# Patient Record
Sex: Female | Born: 1955
Health system: Southern US, Community
[De-identification: ages and names within clinical notes are randomized; demographics above are authoritative.]

## PROBLEM LIST (undated history)

## (undated) DIAGNOSIS — I4892 Unspecified atrial flutter: Secondary | ICD-10-CM

## (undated) DIAGNOSIS — T7840XA Allergy, unspecified, initial encounter: Secondary | ICD-10-CM

## (undated) DIAGNOSIS — L570 Actinic keratosis: Secondary | ICD-10-CM

## (undated) DIAGNOSIS — F329 Major depressive disorder, single episode, unspecified: Secondary | ICD-10-CM

## (undated) DIAGNOSIS — K589 Irritable bowel syndrome without diarrhea: Secondary | ICD-10-CM

## (undated) DIAGNOSIS — F419 Anxiety disorder, unspecified: Secondary | ICD-10-CM

## (undated) DIAGNOSIS — Z923 Personal history of irradiation: Secondary | ICD-10-CM

## (undated) DIAGNOSIS — Z87442 Personal history of urinary calculi: Secondary | ICD-10-CM

## (undated) DIAGNOSIS — G4730 Sleep apnea, unspecified: Secondary | ICD-10-CM

## (undated) DIAGNOSIS — K829 Disease of gallbladder, unspecified: Secondary | ICD-10-CM

## (undated) DIAGNOSIS — M797 Fibromyalgia: Secondary | ICD-10-CM

## (undated) DIAGNOSIS — R112 Nausea with vomiting, unspecified: Secondary | ICD-10-CM

## (undated) DIAGNOSIS — E559 Vitamin D deficiency, unspecified: Secondary | ICD-10-CM

## (undated) DIAGNOSIS — F32A Depression, unspecified: Secondary | ICD-10-CM

## (undated) DIAGNOSIS — N200 Calculus of kidney: Secondary | ICD-10-CM

## (undated) DIAGNOSIS — E669 Obesity, unspecified: Secondary | ICD-10-CM

## (undated) DIAGNOSIS — D0512 Intraductal carcinoma in situ of left breast: Secondary | ICD-10-CM

## (undated) DIAGNOSIS — H43811 Vitreous degeneration, right eye: Secondary | ICD-10-CM

## (undated) DIAGNOSIS — R51 Headache: Secondary | ICD-10-CM

## (undated) DIAGNOSIS — M2550 Pain in unspecified joint: Secondary | ICD-10-CM

## (undated) DIAGNOSIS — E119 Type 2 diabetes mellitus without complications: Secondary | ICD-10-CM

## (undated) DIAGNOSIS — K219 Gastro-esophageal reflux disease without esophagitis: Secondary | ICD-10-CM

## (undated) DIAGNOSIS — C801 Malignant (primary) neoplasm, unspecified: Secondary | ICD-10-CM

## (undated) DIAGNOSIS — I499 Cardiac arrhythmia, unspecified: Secondary | ICD-10-CM

## (undated) DIAGNOSIS — Z9889 Other specified postprocedural states: Secondary | ICD-10-CM

## (undated) DIAGNOSIS — N8110 Cystocele, unspecified: Principal | ICD-10-CM

## (undated) HISTORY — DX: Fibromyalgia: M79.7

## (undated) HISTORY — DX: Vitreous degeneration, right eye: H43.811

## (undated) HISTORY — DX: Sleep apnea, unspecified: G47.30

## (undated) HISTORY — DX: Irritable bowel syndrome, unspecified: K58.9

## (undated) HISTORY — DX: Major depressive disorder, single episode, unspecified: F32.9

## (undated) HISTORY — DX: Depression, unspecified: F32.A

## (undated) HISTORY — DX: Cystocele, unspecified: N81.10

## (undated) HISTORY — DX: Type 2 diabetes mellitus without complications: E11.9

## (undated) HISTORY — DX: Allergy, unspecified, initial encounter: T78.40XA

## (undated) HISTORY — DX: Gastro-esophageal reflux disease without esophagitis: K21.9

## (undated) HISTORY — DX: Obesity, unspecified: E66.9

## (undated) HISTORY — DX: Calculus of kidney: N20.0

## (undated) HISTORY — DX: Headache: R51

## (undated) HISTORY — DX: Cardiac arrhythmia, unspecified: I49.9

## (undated) HISTORY — DX: Pain in unspecified joint: M25.50

## (undated) HISTORY — DX: Disease of gallbladder, unspecified: K82.9

## (undated) HISTORY — DX: Vitamin D deficiency, unspecified: E55.9

## (undated) HISTORY — DX: Actinic keratosis: L57.0

## (undated) HISTORY — DX: Intraductal carcinoma in situ of left breast: D05.12

## (undated) HISTORY — PX: SPINE SURGERY: SHX786

---
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
## 2002-02-01 HISTORY — PX: GALLBLADDER SURGERY: SHX652

## 2003-02-02 HISTORY — PX: CHOLECYSTECTOMY: SHX55

## 2003-11-06 ENCOUNTER — Ambulatory Visit: Payer: Self-pay | Admitting: Unknown Physician Specialty

## 2004-11-26 ENCOUNTER — Ambulatory Visit: Payer: Self-pay | Admitting: Unknown Physician Specialty

## 2005-02-01 HISTORY — PX: BREAST BIOPSY: SHX20

## 2005-06-30 ENCOUNTER — Ambulatory Visit: Payer: Self-pay | Admitting: General Surgery

## 2005-11-01 ENCOUNTER — Ambulatory Visit: Payer: Self-pay | Admitting: General Surgery

## 2005-11-29 ENCOUNTER — Ambulatory Visit: Payer: Self-pay | Admitting: General Surgery

## 2006-08-25 ENCOUNTER — Ambulatory Visit: Payer: Self-pay | Admitting: Family Medicine

## 2006-10-03 LAB — HM DEXA SCAN

## 2006-12-06 ENCOUNTER — Ambulatory Visit: Payer: Self-pay | Admitting: General Surgery

## 2007-02-02 HISTORY — PX: COLONOSCOPY: SHX174

## 2007-02-02 LAB — HM COLONOSCOPY

## 2007-02-03 ENCOUNTER — Ambulatory Visit: Payer: Self-pay | Admitting: General Surgery

## 2007-04-20 ENCOUNTER — Other Ambulatory Visit: Payer: Self-pay

## 2007-04-20 ENCOUNTER — Ambulatory Visit: Payer: Self-pay | Admitting: Obstetrics and Gynecology

## 2007-05-05 ENCOUNTER — Ambulatory Visit: Payer: Self-pay | Admitting: Obstetrics and Gynecology

## 2007-05-05 HISTORY — PX: DILATION AND CURETTAGE OF UTERUS: SHX78

## 2007-12-07 ENCOUNTER — Ambulatory Visit: Payer: Self-pay | Admitting: General Surgery

## 2009-01-23 ENCOUNTER — Ambulatory Visit: Payer: Self-pay | Admitting: Family Medicine

## 2009-01-29 ENCOUNTER — Ambulatory Visit: Payer: Self-pay | Admitting: Family Medicine

## 2009-05-13 ENCOUNTER — Ambulatory Visit: Payer: Self-pay | Admitting: Nurse Practitioner

## 2009-05-27 ENCOUNTER — Ambulatory Visit: Payer: Self-pay | Admitting: Nurse Practitioner

## 2009-06-24 ENCOUNTER — Ambulatory Visit: Payer: Self-pay | Admitting: Nurse Practitioner

## 2009-08-12 ENCOUNTER — Ambulatory Visit: Payer: Self-pay | Admitting: Nurse Practitioner

## 2009-09-09 ENCOUNTER — Ambulatory Visit: Payer: Self-pay | Admitting: Nurse Practitioner

## 2009-12-23 ENCOUNTER — Ambulatory Visit: Payer: Self-pay | Admitting: Nurse Practitioner

## 2010-01-27 ENCOUNTER — Ambulatory Visit: Payer: Self-pay | Admitting: General Surgery

## 2010-06-16 NOTE — Assessment & Plan Note (Signed)
NAME:  Sonya Lopez, Sonya Lopez NO.:  [id number]   MEDICAL RECORD NO.:  [id number]          PATIENT TYPE:  POB   LOCATION:  [facility] at [facility]         FACILITY:  [facility]   PHYSICIAN:  Jaynie Collins, MD     DATE OF BIRTH:  07/28/1955   DATE OF SERVICE:  05/13/2009                                  CLINIC NOTE   The patient comes to office today for new consultation on migraine  headaches.  Both of the patient's daughters are seen in this practice.  The patient started having migraine headaches while she was in college.  She was never given a formal diagnosis of migraine, but she has a  classic sensitivity to lights, sounds, smells, nausea, and sometimes  vomiting.  She describes a questionable aura.  She has not had one in so  many years that she cannot remember the sequencing.  Her visual issue is  that of white flashing spots in front of her eyes.  Her headaches are  located on her left side, left temple, occasionally on the right temple,  occasionally move to the occipital region.  The left side of her neck  and trapezius muscles are often sore, intense.  She does have  fibromyalgia and that seems to contribute to this.  She has taken  [facility] as well as Lyrica for her fibromyalgia.  She was unable to  continue those secondary to side effects.  The patient has noticed that  her headaches have been worse since January.  She did have some  significant family issues after Christmas that were likely related to  this.  She had 1 very bad headache in January which caused her  incredible pain and disability as well as nausea and vomiting.  Since  then, she has had a daily mild headache.  Her usual medications include  Mobic 15 mg for the fibromyalgia as well as imipramine 25 mg at bedtime  that is for stress and urge incontinence.  The patient has been taking a  massage and that does help.  She has had some problems with vertigo and  has taken Antivert.  She had her menopause  approximately 2 years ago,  was unable to take hormone replacement secondary to cyst developing in  her breast.  The patient denies any issues with hypertension or cardiac  disease.   ALLERGIES:  KEFLEX, DOXYCYCLINE, ERYTHROMYCIN, CLEOCIN.   OBSTETRICAL HISTORY:  G3, P3.   SURGICAL HISTORY:  Gallbladder and laparoscopic procedure in 2004, D&C  in 2009, tonsillectomy in 1961.   FAMILY HISTORY:  Diabetes, heart disease, high blood pressure.   MEDICAL HISTORY:  Positive for pneumonia, fibrocystic breast disease,  fibromyalgia, migraines.   SOCIAL HISTORY:  The patient lives at home with her husband and 1  daughter.  She does not work outside the home.  She does not smoke.  She  takes rare alcohol, rare caffeine.  She does not have any history of  physical or sexual abuse.   REVIEW OF SYSTEMS:  Positive for muscle aches, fatigue, frequent  headaches, tinnitus, nausea, vomiting, problems with urine, hot flashes.   PHYSICAL EXAMINATION:  GENERAL:  Well-developed, overweight 55 year old  Caucasian female, in no acute distress.  VITAL SIGNS:  Blood pressure is 105/75, pulse is 94, weight is 205,  height is 5 feet 6 inches.  HEENT:  Head is normocephalic and atraumatic.  Pupils equal, round, and  reactive.  NEUROLOGIC:  The patient is alert, oriented.  She has good muscle  control, good sensation, and good coordination.  CARDIAC:  Regular rate and rhythm.  LUNGS:  Clear bilaterally.   ASSESSMENT:  Transformed chronic migraine.   PLAN:  1. We will try to break this headache cycle while it is still in the      mild phase.  We will do this by using prednisone 20 mg one p.o.      q.a.m. with food for 1 week.  The patient is asked not to take her      Mobic during that time.  She is aware this may cause her to have GI      problems.  2. We will give her an Amerge taper.  She will take Amerge 1/2 tablet      b.i.d. until they are gone, and she is aware that if she gets a      headache  during that time, she can take an extra Amerge.  We will      follow her in 2 weeks and see if we have ended the cycle.  If not,      we may need to consider putting her on some type of prevention.      Remonia Richter, NP    ______________________________  Jaynie Collins, MD    LR/MEDQ  D:  05/13/2009  T:  05/14/2009  Job:  [phone]

## 2010-06-16 NOTE — Assessment & Plan Note (Signed)
NAME:  TONJI, ELLIFF NO.:  [id number]   MEDICAL RECORD NO.:  [id number]          PATIENT TYPE:  POB   LOCATION:  [facility] at [facility]         FACILITY:  [facility]   PHYSICIAN:  Scheryl Darter, MD       DATE OF BIRTH:  1955-09-19   DATE OF SERVICE:  08/12/2009                                  CLINIC NOTE   The patient comes to office today for followup on her migraine  headaches.  The patient started taking her 37.5 Effexor, took it for  approximately 3 days and noticed that she had some redness in her eyes  and discontinued.  She has been having more stress at home with some  family related issues and her headaches have worsened.  She also feels  at times that the Amerge is not strong enough and would like to have  something stronger.   PHYSICAL EXAMINATION:  VITAL SIGNS:  Blood pressure is 115/87, pulse is  87, weight is 210, height is 5 feet and 6 inches.  GENERAL:  Well-developed, well-nourished 55 year old Caucasian female,  in no acute distress.  HEENT:  Head is normocephalic.  Pupils equal and reactive.  There is no  redness in her eyes.  CARDIAC:  Regular rate and rhythm.  LUNGS:  Clear bilaterally.   ASSESSMENT:  Migraine headache.  We had another lengthy discussion today  concerning her headaches.  This is not a common side effect with  Effexor, it may have been related to some other issues and we would like  for her to retrial that.  She will also be given a prescription for  Imitrex 100 mg 1 p.o. p.r.n. migraine #9 with p.r.n. refills to take  when she needs something stronger than the Amerge.  She is aware that  she is not take the Imitrex and the Amerge in the same 24 hours.  We  also talked about her starting an exercise program.  She is currently  210 pounds.  We talked about doing water aerobics.      Remonia Richter, NP    ______________________________  Scheryl Darter, MD    LR/MEDQ  D:  08/12/2009  T:  08/13/2009  Job:  [id number]

## 2010-06-16 NOTE — Assessment & Plan Note (Signed)
NAME:  Sonya Lopez, Sonya Lopez NO.:  [id number]   MEDICAL RECORD NO.:  [id number]          PATIENT TYPE:  POB   LOCATION:  [facility] at [facility]         FACILITY:  [facility]   PHYSICIAN:  Remonia Richter, NP   DATE OF BIRTH:  May 16, 1955   DATE OF SERVICE:  09/09/2009                                  CLINIC NOTE   The patient comes to office today for followup on her migraine  headaches.  She was last seen in July.  She was having rather bedtime.  She did not retrial her Effexor.  She decided to speak with her family  and express how much stress was affecting her headaches and they seem to  have listened.  She was also started on an exercise program with which  she is walking on the treadmill almost every day and she is currently  down 2 pounds.  She has not had any headache and in fact has been  headache free for over 1 week and she does feel that her trigeminal  nerve pain has improved.  She has not had to take the Imitrex though she  did purchase it.   PHYSICAL EXAMINATION:  VITAL SIGNS:  Blood pressure is 112/82, pulse 71,  weight 208, height is 5 feet 6 inches.  GENERAL:  A well-developed, well-nourished 55 year old obese Caucasian  female in no acute distress.  HEENT:  Head is normocephalic and atraumatic.  Pupil equal and reactive.  CARDIAC:  Regular rate and rhythm.  LUNGS:  Clear bilaterally.   ALLERGIES:  KEFLEX, DOXYCYCLINE, EEC, CLEOCIN.   ASSESSMENT:  Migraine improving.   PLAN:  The patient will continue doing what she is has been doing.  She  will be aware of her triggers.  Clearly, stress is a significant trigger  for her and she will work on keeping her stress levels manageable.  She  will use the Imitrex as needed and she can certainly retrial the Effexor  if she gets worse.  Otherwise, she will follow up in 3 months.      Remonia Richter, NP     LR/MEDQ  D:  09/09/2009  T:  09/10/2009  Job:  [id number]

## 2010-06-16 NOTE — Assessment & Plan Note (Signed)
NAME:  Sonya Lopez, Sonya Lopez NO.:  [id number]   MEDICAL RECORD NO.:  [id number]          PATIENT TYPE:  POB   LOCATION:  [facility] at [facility]         FACILITY:  [facility]   PHYSICIAN:  Remonia Richter, NP   DATE OF BIRTH:  [birth year]   DATE OF SERVICE:  06/24/2009                                  CLINIC NOTE   The patient comes to office today for a followup visit from 04/26.  On  04/26, she was diagnosed with transformed chronic migraine with some  neuralgia.  We did trigger point injections that day and started her on  Effexor 37.5 mg.  She does feel that she is quite sensitive to medicines  and so we decided on a low dose.  Today when we have seen her, she is  doing exceptionally better.  She felt that the trigger point injections  helped her tremendously.  She was in fact feeling so much better that  she did not start her Effexor.  We had a long discussion today about  whether that was the right or wrong thing to do and she has decided that  she will start on the Effexor.  She is very pleased with her headache  level of pain now and she will follow up in 6 weeks.      Remonia Richter, NP     LR/MEDQ  D:  06/24/2009  T:  06/25/2009  Job:  [id number]

## 2010-06-16 NOTE — Assessment & Plan Note (Signed)
NAME:  [name], [name] NO.:  [id number]   MEDICAL RECORD NO.:  [id number]          PATIENT TYPE:  POB   LOCATION:  [facility] at [facility]         FACILITY:  [facility]   PHYSICIAN:  Remonia Richter, NP   DATE OF BIRTH:  07-May-1955   DATE OF SERVICE:  12/23/2009                                  CLINIC NOTE   The patient comes to office today for followup on her migraine  headaches.  The patient was last seen in August.  At that time, she had  been doing well and when she comes in today, she is even doing better.  She has had very few headaches.  They seem to be taking care of nicely  with the Imitrex.  Her family has seemed to really be following through  with decreasing amount of stress in her world and overall, she is doing  great.  She feels that the neuralgia has ended and again overall feels  very well.  She is continuing to exercise and take better care of  herself.  She will follow up on an as-needed basis or yearly.      Remonia Richter, NP     LR/MEDQ  D:  12/23/2009  T:  12/24/2009  Job:  [id number]

## 2010-06-16 NOTE — Assessment & Plan Note (Signed)
NAME:  Sonya Lopez, Sonya Lopez NO.:  [id number]   MEDICAL RECORD NO.:  [id number]          PATIENT TYPE:  POB   LOCATION:  [facility] at [facility]         FACILITY:  [facility]   PHYSICIAN:  Allie Bossier, MD        DATE OF BIRTH:  May 27, 1955   DATE OF SERVICE:  05/27/2009                                  CLINIC NOTE   The patient comes to the office today for followup on her migraine  headaches.  She was last seen on April 12.  Please see that note. The  patient did feel that the prednisone and Amerge taper did help with her  headache, but she still has some pain in her left temple, left  occipital, and left trap.  She is willing to start on headache  prevention.  She does feel that she is somewhat sensitive to medication.  We did discuss trigger point injections and she is prepared to do that  today.   Trigger point injection, the patient was injected 2 mL in her left  temple, 2 mL in her left occipital, and 1 mL in her left trap.  Please  see that procedure note.   PHYSICAL EXAMINATION:  GENERAL:  Well-developed, well-nourished 54-year-  old Caucasian female, in no acute distress.  VITAL SIGNS:  Blood pressure is 105/75, pulse is 71, weight is 210.  HEENT:  Head is normocephalic and atraumatic.  The patient is tender at  her temple, occipital, and trap regions.  NEUROLOGICAL:  Alert and oriented.  Good muscle tone and good muscle  sensation.   ASSESSMENT:  Transformed chronic migraine with some neuralgia.   PLAN:  We will do trigger point injections today.  Please see procedure  note.  Also, start her on Effexor 37.5 mg XR 1 p.o. daily #30 with 6  refills.  She will return in 4 weeks for followup on her headaches.  She  will call the office if she needs sooner.      Remonia Richter, NP    ______________________________  Allie Bossier, MD    LR/MEDQ  D:  05/27/2009  T:  05/28/2009  Job:  [id number]

## 2010-12-28 ENCOUNTER — Encounter: Payer: Self-pay | Admitting: Gynecology

## 2010-12-29 ENCOUNTER — Encounter: Payer: Self-pay | Admitting: Nurse Practitioner

## 2010-12-29 ENCOUNTER — Ambulatory Visit (INDEPENDENT_AMBULATORY_CARE_PROVIDER_SITE_OTHER): Payer: PRIVATE HEALTH INSURANCE | Admitting: Nurse Practitioner

## 2010-12-29 DIAGNOSIS — G43909 Migraine, unspecified, not intractable, without status migrainosus: Secondary | ICD-10-CM

## 2010-12-29 DIAGNOSIS — IMO0001 Reserved for inherently not codable concepts without codable children: Secondary | ICD-10-CM

## 2010-12-29 DIAGNOSIS — E669 Obesity, unspecified: Secondary | ICD-10-CM

## 2010-12-29 DIAGNOSIS — G43009 Migraine without aura, not intractable, without status migrainosus: Secondary | ICD-10-CM | POA: Insufficient documentation

## 2010-12-29 DIAGNOSIS — M797 Fibromyalgia: Secondary | ICD-10-CM

## 2010-12-29 MED ORDER — NARATRIPTAN HCL 2.5 MG PO TABS: 2.5000 mg | ORAL_TABLET | Freq: Once | ORAL | Status: DC

## 2010-12-29 MED ORDER — SUMATRIPTAN SUCCINATE 100 MG PO TABS: 100.0000 mg | ORAL_TABLET | Freq: Once | ORAL | Status: DC

## 2010-12-29 NOTE — Progress Notes (Signed)
S: Pt in office today to follow up on migraine headaches. She has been doing very well with only one headache every 2 months. She is taking Imitrex and Amerge as needed. She has also been having massage and exercising daily by walking 30-90 min daily. She has lost 6-7 lbs.   O: Neuro neg Cardiac RRR Lungs Clear  A: Migraine well controlled  P: Refill Imitrex and Amerge, Cont exercise, massage, RTC one year  As Pt was leaving office she asked to discuss a GYN problem. She describes a prolapsed uterus that is currently being treated by MD in [place]. She has not been happy with this care and would like to see Dr Shawnie Pons. An appointment was made for next week.

## 2010-12-29 NOTE — Patient Instructions (Signed)

## 2011-01-05 ENCOUNTER — Ambulatory Visit (INDEPENDENT_AMBULATORY_CARE_PROVIDER_SITE_OTHER): Payer: PRIVATE HEALTH INSURANCE | Admitting: Physician Assistant

## 2011-01-05 ENCOUNTER — Encounter: Payer: Self-pay | Admitting: Physician Assistant

## 2011-01-05 VITALS — BP 122/72 | HR 68 | Ht 66.0 in | Wt 199.5 lb

## 2011-01-05 DIAGNOSIS — N8110 Cystocele, unspecified: Secondary | ICD-10-CM

## 2011-01-05 DIAGNOSIS — IMO0002 Reserved for concepts with insufficient information to code with codable children: Secondary | ICD-10-CM | POA: Insufficient documentation

## 2011-01-05 DIAGNOSIS — N8111 Cystocele, midline: Secondary | ICD-10-CM

## 2011-01-05 HISTORY — DX: Cystocele, unspecified: N81.10

## 2011-01-05 NOTE — Patient Instructions (Signed)
Prolapse  Prolapse means the falling down, bulging, dropping, or drooping of a body part. Organs that commonly prolapse include the rectum, small intestine, bladder, urethra, vagina (birth canal), uterus (womb), and cervix. Prolapse occurs when the ligaments and muscle tissue around the rectum, bladder, and uterus are damaged or weakened.  CAUSES  This happens especially with:  Childbirth. Some women feel pelvic pressure or have trouble holding their urine right after childbirth, because of stretching and tearing of pelvic tissues. This generally gets better with time and the feeling usually goes away, but it may return with aging.   Chronic heavy lifting.   Aging.   Menopause, with loss of estrogen production weakening the pelvic ligaments and muscles.   Past pelvic surgery.   Obesity.   Chronic constipation.   Chronic cough.  Prolapse may affect a single organ, or several organs may prolapse at the same time. The front wall of the vagina holds up the bladder. The back wall holds up part of the lower intestine, or rectum. The uterus fills a spot in the middle. All these organs can be involved when the ligaments and muscles around the vagina relax too much. This often gets worse when women stop producing estrogen (menopause). SYMPTOMS  Uncontrolled loss of urine (incontinence) with cough, sneeze, straining, and exercise.   More force may be required to have a bowel movement, due to trapping of the stool.   When part of an organ bulges through the opening of the vagina, there is sometimes a feeling of heaviness or pressure. It may feel as though something is falling out. This sensation increases with coughing or bearing down.   If the organs protrude through the opening of the vagina and rub against the clothing, there may be soreness, ulcers, infection, pain, and bleeding.   Lower back pain.   Pushing in the upper or lower part of the vagina, to pass urine or have a bowel movement.     Problems having sexual intercourse.   Being unable to insert a tampon or applicator.  DIAGNOSIS  Usually, a physical exam is all that is needed to identify the problem. During the examination, you may be asked to cough and strain while lying down, sitting up, and standing up. Your caregiver will determine if more testing is required, such as bladder function tests. Some diagnoses are:  Cystocele: Bulging and falling of the bladder into the top of the vagina.   Rectocele: Part of the rectum bulging into the vagina.   Prolapse of the uterus: The uterus falls or drops into the vagina.   Enterocele: Bulging of the top of the vagina, after a hysterectomy (uterus removal), with the small intestine bulging into the vagina. A hernia in the top of the vagina.   Urethrocele: The urethra (urine carrying tube) bulging into the vagina.  TREATMENT  In most cases, prolapse needs to be treated only if it produces symptoms. If the symptoms are interfering with your usual daily or sexual activities, treatment may be necessary. The following are some measures that may be used to treat prolapse.  Estrogen may help elderly women with mild prolapse.   Kegel exercises may help mild cases of prolapse, by strengthening and tightening the muscles of the pelvic floor.   Pessaries are used in women who choose not to, or are unable to, have surgery. A pessary is a doughnut-shaped piece of plastic or rubber that is put into the vagina to keep the organs in place. This device must   be fitted by your caregiver. Your caregiver will also explain how to care for yourself with the pessary. If it works well for you, this may be the only treatment required.   Surgery is often the only form of treatment for more severe prolapses. There are different types of surgery available. You should discuss what the best procedure is for you. If the uterus is prolapsed, it may be removed (hysterectomy) as part of the surgical treatment.  Your caregiver will discuss the risks and benefits with you.   Uterine-vaginal suspension (surgery to hold up the organs) may be used, especially if you want to maintain your fertility.  No form of treatment is guaranteed to correct the prolapse or relieve the symptoms. HOME CARE INSTRUCTIONS   Wear a sanitary pad or absorbent product if you have incontinence of urine.   Avoid heavy lifting and straining with exercise and work.   Take over-the-counter pain medicine for minor discomfort.   Try taking estrogen or using estrogen vaginal cream.   Try Kegel exercises or use a pessary, before deciding to have surgery.   Do Kegel exercises after having a baby.  SEEK MEDICAL CARE IF:   Your symptoms interfere with your daily activities.   You need medicine to help with the discomfort.   You need to be fitted with a pessary.   You notice bleeding from the vagina.   You think you have ulcers or you notice ulcers on the cervix.   You have an oral temperature above 102 F (38.9 C).   You develop pain or blood with urination.   You have bleeding with a bowel movement.   The symptoms are interfering with your sex life.   You have urinary incontinence that interferes with your daily activities.   You lose urine with sexual intercourse.   You have a chronic cough.   You have chronic constipation.  Document Released: 07/25/2002 Document Revised: 09/30/2010 Document Reviewed: 02/02/2009 [employer] Patient Information 2012 [employer]. 

## 2011-01-05 NOTE — Progress Notes (Signed)
Chief Complaint:  Vaginal Prolapse   Sonya Lopez is  55 y.o. No obstetric history on file..  No LMP recorded. Patient is postmenopausal..  She presents complaining of Vaginal Prolapse  Reports increased vaginal pressure and palpable "bulge" coming out of vaginal with prolonged standing. Denies pain in abd and pelvis, vaginal bleeding or discharge. Positive urinary incontinence being followed by Urology. Reports recently starting new medication for bladder control without much change in leaking.   Last pap NL, 2010  Obstetrical/Gynecological History: OB History    Grav Para Term Preterm Abortions TAB SAB Ect Mult Living                  Past Medical History: Past Medical History  Diagnosis Date  . Headache   . Allergy   . Fibromyalgia   . Female cystocele 01/05/2011    Past Surgical History: Past Surgical History  Procedure Date  . Gallbladder surgery 2004  . Dilation and curettage of uterus 05/05/2007  . Tonsillectomy 1961  . Cholecystectomy   . Cesarean section     x 1    Family History: Family History  Problem Relation Age of Onset  . Diabetes Mother   . Hearing loss Mother   . Heart disease Father   . Heart disease Paternal Grandfather     Social History: History  Substance Use Topics  . Smoking status: Never Smoker   . Smokeless tobacco: Not on file  . Alcohol Use: Yes     rare    Allergies:  Allergies  Allergen Reactions  . Cleocin   . Doxycycline   . Keflex     Review of Systems - Negative except what has been reviewed in HPI  Physical Exam   Blood pressure 122/72, pulse 68, height 5\' 6"  (1.676 m), weight 199 lb 8 oz (90.493 kg).  General: General appearance - alert, well appearing, and in no distress, oriented to person, place, and time and overweight Mental status - alert, oriented to person, place, and time, normal mood, behavior, speech, dress, motor activity, and thought processes, affect appropriate to mood Abdomen - soft,  nontender, nondistended, no masses or organomegaly Focused Gynecological Exam: VULVA: normal appearing vulva with no masses, tenderness or lesions, Tanner V, VAGINA: PELVIC FLOOR EXAM: rectocele grade I cystocele  Grade 3 on relaxation, Grade 4 with bearing down,, CERVIX: normal appearing cervix without discharge or lesions, multiparous os, UTERUS: non-enlarge, non tender, ADNEXA: non-palp, non-tender   Assessment: Grade 3/4 Cystocele  Patient Active Problem List  Diagnoses  . Migraine  . Fibromyalgia  . Obesity  . Female cystocele    Plan: Discussed treatment options to include pessary and surgical repair. Pt opts for surgerical management due to hx of inability to use vaginal estrogen ring due to pelvic floor laxity.  Will obtain Pelvic US RTC 1-2 for surgical consult with MD.  Maylon Cos E. 01/05/2011,11:03 AM

## 2011-01-18 ENCOUNTER — Ambulatory Visit: Payer: PRIVATE HEALTH INSURANCE | Admitting: Obstetrics & Gynecology

## 2011-01-21 ENCOUNTER — Encounter: Payer: Self-pay | Admitting: Obstetrics & Gynecology

## 2011-01-21 ENCOUNTER — Ambulatory Visit (INDEPENDENT_AMBULATORY_CARE_PROVIDER_SITE_OTHER): Payer: PRIVATE HEALTH INSURANCE | Admitting: Obstetrics & Gynecology

## 2011-01-21 VITALS — BP 124/69 | HR 74 | Ht 66.0 in | Wt 200.0 lb

## 2011-01-21 DIAGNOSIS — Z01419 Encounter for gynecological examination (general) (routine) without abnormal findings: Secondary | ICD-10-CM

## 2011-01-21 DIAGNOSIS — N951 Menopausal and female climacteric states: Secondary | ICD-10-CM

## 2011-01-21 DIAGNOSIS — Z1272 Encounter for screening for malignant neoplasm of vagina: Secondary | ICD-10-CM

## 2011-01-21 MED ORDER — ESTRADIOL 0.1 MG/GM VA CREA: TOPICAL_CREAM | VAGINAL | Status: DC

## 2011-01-21 NOTE — Progress Notes (Signed)
Subjective:    Sonya Lopez is a 55 y.o. female who presents for an annual exam. The patient has no complaints today. The patient is not currently sexually active. Her husband has ED GYN screening history: last pap: was normal. The patient wears seatbelts: yes. The patient participates in regular exercise: yes. Has the patient ever been transfused or tattooed?: no. The patient reports that there is not domestic violence in her life.   Menstrual History: OB History    Grav Para Term Preterm Abortions TAB SAB Ect Mult Living                  Menarche age: 11 No LMP recorded. Patient is postmenopausal.    The following portions of the patient's history were reviewed and updated as appropriate: allergies, current medications, past family history, past medical history, past social history, past surgical history and problem list.  Review of Systems A comprehensive review of systems was negative. Her mother has dementia. She has mixed incontinence. Became menopausal in 2008.   Objective:    BP 124/69  Pulse 74  Ht 5\' 6"  (1.676 m)  Wt 200 lb (90.719 kg)  BMI 32.28 kg/m2  General Appearance:    Alert, cooperative, no distress, appears stated age  Head:    Normocephalic, without obvious abnormality, atraumatic  Eyes:    PERRL, conjunctiva/corneas clear, EOM's intact, fundi    benign, both eyes  Ears:    Normal TM's and external ear canals, both ears  Nose:   Nares normal, septum midline, mucosa normal, no drainage    or sinus tenderness  Throat:   Lips, mucosa, and tongue normal; teeth and gums normal  Neck:   Supple, symmetrical, trachea midline, no adenopathy;    thyroid:  no enlargement/tenderness/nodules; no carotid   bruit or JVD  Back:     Symmetric, no curvature, ROM normal, no CVA tenderness  Lungs:     Clear to auscultation bilaterally, respirations unlabored  Chest Wall:    No tenderness or deformity   Heart:    Regular rate and rhythm, S1 and S2 normal, no murmur, rub    or gallop  Breast Exam:    No tenderness, masses, or nipple abnormality  Abdomen:     Soft, non-tender, bowel sounds active all four quadrants,    no masses, no organomegaly  Genitalia:    Normal female without lesion, discharge or tenderness, moderate atrophy noted, 3rd degree uterine prolapse, 4th degree cystocele, no rectocele, NSSA, NT, no adnexal masses     Extremities:   Extremities normal, atraumatic, no cyanosis or edema  Pulses:   2+ and symmetric all extremities  Skin:   Skin color, texture, turgor normal, no rashes or lesions  Lymph nodes:   Cervical, supraclavicular, and axillary nodes normal  Neurologic:   CNII-XII intact, normal strength, sensation and reflexes    throughout  .    Assessment:    Healthy female exam.  Mixed incontinence Symptomatic uterine prolapse and cystocele   Plan:     Await pap smear results.  Mammogram February 03, 2011

## 2011-02-02 HISTORY — PX: ABDOMINAL HYSTERECTOMY: SHX81

## 2011-02-03 ENCOUNTER — Ambulatory Visit: Payer: Self-pay | Admitting: General Surgery

## 2011-02-18 ENCOUNTER — Ambulatory Visit: Payer: PRIVATE HEALTH INSURANCE | Admitting: Obstetrics & Gynecology

## 2011-02-25 ENCOUNTER — Ambulatory Visit (INDEPENDENT_AMBULATORY_CARE_PROVIDER_SITE_OTHER): Payer: PRIVATE HEALTH INSURANCE | Admitting: Obstetrics & Gynecology

## 2011-02-25 ENCOUNTER — Encounter: Payer: Self-pay | Admitting: Obstetrics & Gynecology

## 2011-02-25 VITALS — BP 128/84 | HR 99 | Ht 66.0 in | Wt 199.0 lb

## 2011-02-25 DIAGNOSIS — N814 Uterovaginal prolapse, unspecified: Secondary | ICD-10-CM

## 2011-02-25 NOTE — Progress Notes (Signed)
  Subjective:    Patient ID: Sonya Lopez, female    DOB: 1955/07/03, 56 y.o.   MRN: [id number]  HPI  Sonya Lopez is here today with her daughter and granddaughter to tell me that although she would like surgery, she is unable to do so now because her elderly mother is going into [facility] and will be needing her as a caretaker. She would like to try a pessary in the meantime.  Review of Systems     Objective:   Physical Exam   Good estrogen effect of the mucosa (using vaginal estrogen) A # 6 ring fit well, relieved her discomfort. She was able to remove and insert it without difficulty    Assessment & Plan:  4 th uterine prolapse and cystocele- She will use pessary and have surgery prn

## 2011-03-25 ENCOUNTER — Ambulatory Visit: Payer: PRIVATE HEALTH INSURANCE | Admitting: Obstetrics & Gynecology

## 2011-04-16 ENCOUNTER — Inpatient Hospital Stay: Payer: Self-pay | Admitting: Student

## 2011-04-16 DIAGNOSIS — E119 Type 2 diabetes mellitus without complications: Secondary | ICD-10-CM | POA: Insufficient documentation

## 2011-04-16 LAB — COMPREHENSIVE METABOLIC PANEL
Bilirubin,Total: 0.3 mg/dL (ref 0.2–1.0)
Chloride: 103 mmol/L (ref 98–107)
Co2: 28 mmol/L (ref 21–32)
EGFR (African American): >60
EGFR (Non-African Amer.): >60
Glucose: 127 mg/dL — ABNORMAL HIGH (ref 65–99)
Osmolality: 283 (ref 275–301)
SGOT(AST): 30 U/L (ref 15–37)
SGPT (ALT): 31 U/L
Sodium: 140 mmol/L (ref 136–145)
Total Protein: 8 g/dL (ref 6.4–8.2)

## 2011-04-16 LAB — CBC
HCT: 42.7 % (ref 35.0–47.0)
HGB: 14.3 g/dL (ref 12.0–16.0)
MCHC: 33.5 g/dL (ref 32.0–36.0)
RBC: 4.68 10*6/uL (ref 3.80–5.20)
WBC: 11.7 10*3/uL — ABNORMAL HIGH (ref 3.6–11.0)

## 2011-04-16 LAB — PROTIME-INR: INR: 0.9

## 2011-04-16 LAB — TROPONIN I: Troponin-I: 0.02 ng/mL

## 2011-04-17 LAB — BASIC METABOLIC PANEL
Calcium, Total: 9.2 mg/dL (ref 8.5–10.1)
Chloride: 107 mmol/L (ref 98–107)
Co2: 28 mmol/L (ref 21–32)
EGFR (African American): >60
Glucose: 90 mg/dL (ref 65–99)
Osmolality: 282 (ref 275–301)
Potassium: 4.2 mmol/L (ref 3.5–5.1)
Sodium: 141 mmol/L (ref 136–145)

## 2011-04-17 LAB — CK TOTAL AND CKMB (NOT AT ARMC)
CK, Total: 60 U/L
CK-MB: 0.8 ng/mL

## 2011-04-17 LAB — LIPID PANEL
Cholesterol: 164 mg/dL (ref 0–200)
HDL Cholesterol: 43 mg/dL (ref 40–60)
Triglycerides: 83 mg/dL (ref 0–200)
VLDL Cholesterol, Calc: 17 mg/dL (ref 5–40)

## 2011-04-17 LAB — CBC WITH DIFFERENTIAL/PLATELET
Basophil #: 0 10*3/uL (ref 0.0–0.1)
Eosinophil #: 0.1 10*3/uL (ref 0.0–0.7)
Eosinophil %: 0.8 %
HCT: 41.4 % (ref 35.0–47.0)
Lymphocyte #: 2.3 10*3/uL (ref 1.0–3.6)
Lymphocyte %: 29.8 %
MCHC: 32.7 g/dL (ref 32.0–36.0)
Monocyte %: 7.8 %
Neutrophil #: 4.7 10*3/uL (ref 1.4–6.5)
Platelet: 282 10*3/uL (ref 150–440)
RDW: 14.3 % (ref 11.5–14.5)

## 2011-04-17 LAB — TROPONIN I: Troponin-I: <0.02 ng/mL

## 2011-04-17 LAB — CLOSTRIDIUM DIFFICILE BY PCR

## 2011-04-17 LAB — MAGNESIUM: Magnesium: 2.1 mg/dL

## 2011-04-17 LAB — HEMOGLOBIN A1C: Hemoglobin A1C: 6.7 % — ABNORMAL HIGH (ref 4.2–6.3)

## 2011-04-19 ENCOUNTER — Ambulatory Visit: Payer: PRIVATE HEALTH INSURANCE | Admitting: Obstetrics & Gynecology

## 2011-04-29 ENCOUNTER — Ambulatory Visit: Payer: PRIVATE HEALTH INSURANCE | Admitting: Obstetrics & Gynecology

## 2011-05-03 ENCOUNTER — Ambulatory Visit (INDEPENDENT_AMBULATORY_CARE_PROVIDER_SITE_OTHER): Payer: PRIVATE HEALTH INSURANCE | Admitting: Cardiovascular Disease

## 2011-05-03 ENCOUNTER — Encounter: Payer: Self-pay | Admitting: Cardiovascular Disease

## 2011-05-03 VITALS — BP 132/65 | HR 57 | Ht 64.0 in | Wt 199.0 lb

## 2011-05-03 DIAGNOSIS — I4892 Unspecified atrial flutter: Secondary | ICD-10-CM | POA: Insufficient documentation

## 2011-05-03 DIAGNOSIS — E669 Obesity, unspecified: Secondary | ICD-10-CM

## 2011-05-03 DIAGNOSIS — Z01810 Encounter for preprocedural cardiovascular examination: Secondary | ICD-10-CM | POA: Insufficient documentation

## 2011-05-03 MED ORDER — AMIODARONE HCL 200 MG PO TABS: 200.0000 mg | ORAL_TABLET | Freq: Every day | ORAL | Status: DC

## 2011-05-03 MED ORDER — METOPROLOL TARTRATE 25 MG PO TABS: 25.0000 mg | ORAL_TABLET | Freq: Two times a day (BID) | ORAL | Status: DC

## 2011-05-03 NOTE — Assessment & Plan Note (Signed)
We have encouraged continued exercise, careful diet management in an effort to lose weight. 

## 2011-05-03 NOTE — Patient Instructions (Signed)
You are doing well. Please stop the amiodarone for now  If you have tachycardia again, Take amiodarone x 2 with extra metoprolol pill Call the office  If you continue to have significant fatigue, call the office  Please call us if you have new issues that need to be addressed before your next appt.  Your physician wants you to follow-up in: 6 months.  You will receive a reminder letter in the mail two months in advance. If you don't receive a letter, please call our office to schedule the follow-up appointment.

## 2011-05-03 NOTE — Progress Notes (Signed)
Patient ID: Sonya Lopez, female    DOB: 06-27-1955, 56 y.o.   MRN: [id number]  HPI Comments: Sonya Lopez is a very pleasant 56 year old woman with a history of diabetes, GERD, mild obesity with recent hospital admission in 04/16/2011 for atrial flutter with RVR. She presents to establish care.  She reports that on March 5 she had a sinus infection. She was on Augmentin for 10 days, also taking Sudafed. She developed atrial flutter on March 14 felt as a rapid rhythm, tightness in her chest with mild shortness of breath. She went to the emergency room when her fast heart rate did not improve.  EKG showed atrial flutter with ventricular rate 145 beats per minute. She was given amiodarone IV and then by mouth conversion to normal sinus rhythm.  Echocardiogram is essentially normal with normal systolic function, ejection fraction greater than 55%, no significant valvular disease for wall motion abnormality.  Since then she has had occasional palpitations but otherwise feels well. No significant shortness of breath.  She was started on aspirin in the hospital. She does have some fatigue on amiodarone and metoprolol.  EKG today shows sinus bradycardia with rate 57 beats per minute with no significant ST or T wave changes Chest x-ray in the hospital was essentially normal Total cholesterol 164, HDL 43, LDL 104, TSH 1.89   Outpatient Encounter Prescriptions as of 05/03/2011  Medication Sig Dispense Refill  . amiodarone (PACERONE) 200 MG tablet Take 1 tablet (200 mg total) by mouth daily.  30 tablet  6  . aspirin 81 MG tablet Take 81 mg by mouth daily.      . calcium carbonate 200 MG capsule Take 250 mg by mouth 2 (two) times daily with a meal.        . clonazePAM (KLONOPIN) 0.5 MG tablet Take 0.5 mg by mouth as directed. Take 1/2 tablet to 1 tablet if needed for stress.      . desloratadine (CLARINEX) 5 MG tablet Take 5 mg by mouth daily.        Marland Kitchen esomeprazole (NEXIUM) 40 MG capsule Take 40  mg by mouth daily before breakfast.        . estradiol (ESTRACE) 0.1 MG/GM vaginal cream Place 1/2 a gram into vagina every other night.  42.5 g  12  . imipramine (TOFRANIL) 25 MG tablet Take 25 mg by mouth at bedtime.        . meloxicam (MOBIC) 15 MG tablet Take 15 mg by mouth daily.        . metoprolol tartrate (LOPRESSOR) 25 MG tablet Take 1 tablet (25 mg total) by mouth 2 (two) times daily.  60 tablet  6  . Multiple Vitamin (MULTIVITAMIN) capsule Take 1 capsule by mouth daily.        . naratriptan (AMERGE) 2.5 MG tablet Take 1 tablet (2.5 mg total) by mouth once. May repeat once in 4 hours  9 tablet  11  . Probiotic Product (PROBIOTIC PO) Take by mouth.        . SUMAtriptan (IMITREX) 100 MG tablet Take 1 tablet (100 mg total) by mouth once. May repeat once in 2 hours  9 tablet  11    Review of Systems  Constitutional: Negative.   HENT: Negative.   Eyes: Negative.   Respiratory: Negative.   Cardiovascular: Negative.   Gastrointestinal: Negative.   Musculoskeletal: Negative.   Skin: Negative.   Neurological: Negative.   Hematological: Negative.   Psychiatric/Behavioral: Negative.   All other  systems reviewed and are negative.    BP 132/65  Pulse 57  Wt 199 lb (90.266 kg)  Physical Exam  Nursing note and vitals reviewed. Constitutional: She is oriented to person, place, and time. She appears well-developed and well-nourished.  HENT:  Head: Normocephalic.  Nose: Nose normal.  Mouth/Throat: Oropharynx is clear and moist.  Eyes: Conjunctivae are normal. Pupils are equal, round, and reactive to light.  Neck: Normal range of motion. Neck supple. No JVD present.  Cardiovascular: Normal rate, regular rhythm, S1 normal, S2 normal, normal heart sounds and intact distal pulses.  Exam reveals no gallop and no friction rub.   No murmur heard. Pulmonary/Chest: Effort normal and breath sounds normal. No respiratory distress. She has no wheezes. She has no rales. She exhibits no  tenderness.  Abdominal: Soft. Bowel sounds are normal. She exhibits no distension. There is no tenderness.  Musculoskeletal: Normal range of motion. She exhibits no edema and no tenderness.  Lymphadenopathy:    She has no cervical adenopathy.  Neurological: She is alert and oriented to person, place, and time. Coordination normal.  Skin: Skin is warm and dry. No rash noted. No erythema.  Psychiatric: She has a normal mood and affect. Her behavior is normal. Judgment and thought content normal.         Assessment and Plan

## 2011-05-03 NOTE — Assessment & Plan Note (Signed)
Episode of atrial flutter, converting to normal sinus rhythm after amiodarone. She has felt well since the hospital discharge. We will hold amiodarone and continue metoprolol tartrate 25 mg twice a day. We have suggested if she has additional episodes of atrial flutter, to take amiodarone 2 tablets,  Come to the office for EKG.  If she has additional episodes, we would consider ablation for atrial flutter.

## 2011-05-03 NOTE — Assessment & Plan Note (Signed)
She would be low/acceptable risk for upcoming hysterectomy and bladder surgery. No further workup is needed at this time. We have suggested she stay on her metoprolol, and restart amiodarone one week prior to the surgery through the postop period and then stop the amiodarone after the procedure is complete. The amiodarone would likely help prevent any further arrhythmia during the perioperative period.

## 2011-05-11 ENCOUNTER — Ambulatory Visit: Payer: PRIVATE HEALTH INSURANCE | Admitting: Cardiovascular Disease

## 2011-05-20 ENCOUNTER — Encounter: Payer: Self-pay | Admitting: Obstetrics & Gynecology

## 2011-05-20 ENCOUNTER — Ambulatory Visit (INDEPENDENT_AMBULATORY_CARE_PROVIDER_SITE_OTHER): Payer: PRIVATE HEALTH INSURANCE | Admitting: Obstetrics & Gynecology

## 2011-05-20 VITALS — BP 120/70 | HR 67 | Ht 66.0 in | Wt 201.0 lb

## 2011-05-20 DIAGNOSIS — R32 Unspecified urinary incontinence: Secondary | ICD-10-CM

## 2011-05-20 DIAGNOSIS — N812 Incomplete uterovaginal prolapse: Secondary | ICD-10-CM

## 2011-05-20 NOTE — Progress Notes (Signed)
  Subjective:    Patient ID: Sonya Lopez, female    DOB: 08-Aug-1955, 56 y.o.   MRN: [id number]  HPI  Sonya Lopez has a symptomatic 4th degree cystocele and a 3rd degree uterine prolapse as well as some GSUI. She would lilke surgical management for this problem. She has been using vaginal estrogen every other day and has been using a pessary in the meantime. She is not happy with the pessary. Her cardiologist has given her surgical clearance.  Review of Systems     Objective:   Physical Exam  Findings as above, good estrogen effect of mucosa      Assessment & Plan:  As above- I feel that she would benefit from a hysterectomy in combination with a sacrocolpopexy and TVT. I would like to coordinate this surgery with Dr. Laverle Patter at his convenience. She will be sent there for an evaluation.

## 2011-06-04 ENCOUNTER — Encounter: Payer: Self-pay | Admitting: Cardiovascular Disease

## 2011-09-20 ENCOUNTER — Other Ambulatory Visit: Payer: Self-pay | Admitting: Urology

## 2011-10-26 ENCOUNTER — Ambulatory Visit (INDEPENDENT_AMBULATORY_CARE_PROVIDER_SITE_OTHER): Payer: PRIVATE HEALTH INSURANCE | Admitting: Cardiovascular Disease

## 2011-10-26 ENCOUNTER — Encounter: Payer: Self-pay | Admitting: Cardiovascular Disease

## 2011-10-26 VITALS — BP 138/80 | HR 63 | Ht 66.0 in | Wt 200.5 lb

## 2011-10-26 DIAGNOSIS — E669 Obesity, unspecified: Secondary | ICD-10-CM

## 2011-10-26 DIAGNOSIS — Z01810 Encounter for preprocedural cardiovascular examination: Secondary | ICD-10-CM

## 2011-10-26 DIAGNOSIS — I4892 Unspecified atrial flutter: Secondary | ICD-10-CM

## 2011-10-26 NOTE — Patient Instructions (Addendum)
You are doing well. No medication changes were made.  Please call us if you have new issues that need to be addressed before your next appt.  Your physician wants you to follow-up in: 6 months.  You will receive a reminder letter in the mail two months in advance. If you don't receive a letter, please call our office to schedule the follow-up appointment.   

## 2011-10-26 NOTE — Assessment & Plan Note (Signed)
We have encouraged continued exercise, careful diet management in an effort to lose weight. 

## 2011-10-26 NOTE — Progress Notes (Signed)
Patient ID: Sonya Lopez, female    DOB: [birth year], 56 y.o.   MRN: [id number]  HPI Comments: Sonya Lopez is a very pleasant 56 year old woman with a history of diabetes, GERD, mild obesity with recent hospital admission in 04/16/2011 for atrial flutter with RVR. She presents for routine followup.   She reports that she has rare episodes of dizziness though overall feels well. She does not take amiodarone on a regular basis.  She is scheduled for GYN surgery 11/30/2011 at [facility].  She denies any tachycardia or palpitations concerning for arrhythmia.  Previous  Echocardiogram is essentially normal with normal systolic function, ejection fraction greater than 55%, no significant valvular disease for wall motion abnormality.  EKG today shows sinus bradycardia with rate 63 beats per minute with no significant ST or T wave changes  Chest x-ray in the hospital was essentially normal Total cholesterol 164, HDL 43, LDL 104, TSH 1.89   Outpatient Encounter Prescriptions as of 10/26/2011  Medication Sig Dispense Refill  . amiodarone (PACERONE) 200 MG tablet Take 200 mg by mouth as needed.      Marland Kitchen aspirin 81 MG tablet Take 81 mg by mouth daily.      . calcium carbonate 200 MG capsule Take 250 mg by mouth 2 (two) times daily with a meal.        . clonazePAM (KLONOPIN) 0.5 MG tablet Take 0.5 mg by mouth as directed. Take 1/2 tablet to 1 tablet if needed for stress.      . desloratadine (CLARINEX) 5 MG tablet Take 5 mg by mouth daily.        Marland Kitchen esomeprazole (NEXIUM) 40 MG capsule Take 40 mg by mouth daily before breakfast.        . estradiol (ESTRACE) 0.1 MG/GM vaginal cream Place 1/2 a gram into vagina every other night.  42.5 g  12  . imipramine (TOFRANIL) 25 MG tablet Take 25 mg by mouth at bedtime.        . meloxicam (MOBIC) 15 MG tablet Take 15 mg by mouth daily.        . metoprolol tartrate (LOPRESSOR) 25 MG tablet Take 1 tablet (25 mg total) by mouth 2 (two) times daily.  60  tablet  6  . Multiple Vitamin (MULTIVITAMIN) capsule Take 1 capsule by mouth daily.        . naratriptan (AMERGE) 2.5 MG tablet Take 2.5 mg by mouth as needed. May repeat once in 4 hours      . Probiotic Product (PROBIOTIC PO) Take by mouth.        . SUMAtriptan (IMITREX) 100 MG tablet Take 100 mg by mouth as needed. May repeat once in 2 hours         Review of Systems  Constitutional: Negative.   HENT: Negative.   Eyes: Negative.   Respiratory: Negative.   Cardiovascular: Negative.   Gastrointestinal: Negative.   Musculoskeletal: Negative.   Skin: Negative.   Neurological: Negative.   Hematological: Negative.   Psychiatric/Behavioral: Negative.   All other systems reviewed and are negative.    BP 138/80  Pulse 63  Ht 5\' 6"  (1.676 m)  Wt 200 lb 8 oz (90.946 kg)  BMI 32.36 kg/m2  Physical Exam  Nursing note and vitals reviewed. Constitutional: She is oriented to person, place, and time. She appears well-developed and well-nourished.  HENT:  Head: Normocephalic.  Nose: Nose normal.  Mouth/Throat: Oropharynx is clear and moist.  Eyes: Conjunctivae normal are normal. Pupils are  equal, round, and reactive to light.  Neck: Normal range of motion. Neck supple. No JVD present.  Cardiovascular: Normal rate, regular rhythm, S1 normal, S2 normal, normal heart sounds and intact distal pulses.  Exam reveals no gallop and no friction rub.   No murmur heard. Pulmonary/Chest: Effort normal and breath sounds normal. No respiratory distress. She has no wheezes. She has no rales. She exhibits no tenderness.  Abdominal: Soft. Bowel sounds are normal. She exhibits no distension. There is no tenderness.  Musculoskeletal: Normal range of motion. She exhibits no edema and no tenderness.  Lymphadenopathy:    She has no cervical adenopathy.  Neurological: She is alert and oriented to person, place, and time. Coordination normal.  Skin: Skin is warm and dry. No rash noted. No erythema.    Psychiatric: She has a normal mood and affect. Her behavior is normal. Judgment and thought content normal.         Assessment and Plan

## 2011-10-26 NOTE — Assessment & Plan Note (Signed)
She would be acceptable risk for her upcoming hysterectomy and GYN surgery. No further testing needed.

## 2011-10-26 NOTE — Assessment & Plan Note (Signed)
Continue to hold amiodarone and continue metoprolol tartrate 25 mg twice a day. We have suggested if she has additional episodes of atrial flutter, to  Come to the office for EKG.  If she has additional episodes, we would consider ablation for atrial flutter.

## 2011-11-15 ENCOUNTER — Encounter (HOSPITAL_COMMUNITY): Payer: Self-pay | Admitting: Pharmacist

## 2011-11-23 ENCOUNTER — Encounter (HOSPITAL_COMMUNITY)
Admission: RE | Admit: 2011-11-23 | Discharge: 2011-11-23 | Disposition: A | Discharge status: Home / Self Care | Payer: PRIVATE HEALTH INSURANCE | Source: Ambulatory Visit | Attending: Obstetrics & Gynecology | Admitting: Obstetrics & Gynecology

## 2011-11-23 ENCOUNTER — Encounter (HOSPITAL_COMMUNITY): Payer: Self-pay

## 2011-11-23 HISTORY — DX: Other specified postprocedural states: Z98.890

## 2011-11-23 HISTORY — DX: Nausea with vomiting, unspecified: R11.2

## 2011-11-23 HISTORY — DX: Anxiety disorder, unspecified: F41.9

## 2011-11-23 HISTORY — DX: Other specified postprocedural states: R11.2

## 2011-11-23 LAB — SURGICAL PCR SCREEN: Staphylococcus aureus: NEGATIVE

## 2011-11-23 LAB — CBC
Platelets: 326 10*3/uL (ref 150–400)
RBC: 4.45 MIL/uL (ref 3.87–5.11)
WBC: 7.6 10*3/uL (ref 4.0–10.5)

## 2011-11-23 LAB — APTT: aPTT: 39 seconds — ABNORMAL HIGH (ref 24–37)

## 2011-11-23 LAB — PROTIME-INR: Prothrombin Time: 12.4 seconds (ref 11.6–15.2)

## 2011-11-23 NOTE — Patient Instructions (Signed)
Your procedure is scheduled on:11/30/11  Enter through the Main Entrance at :6am Pick up desk phone and dial [phone] and inform us of your arrival.  Please call [phone] if you have any problems the morning of surgery.  Remember: Do not eat or drink after midnight:Monday   Take these meds the morning of surgery with a sip of water:Metoprolol, Amiodarone  DO NOT wear jewelry, eye make-up, lipstick,body lotion, or dark fingernail polish. Do not shave for 48 hours prior to surgery.  If you are to be admitted after surgery, leave suitcase in car until your room has been assigned. Patients discharged on the day of surgery will not be allowed to drive home.

## 2011-11-25 NOTE — Pre-Procedure Instructions (Signed)
Anesthesia record from [facility] on chart re difficult intubation. Dr. Sherron Ales made aware and he requested records.

## 2011-11-29 MED ORDER — GENTAMICIN IN SALINE 1.6-0.9 MG/ML-% IV SOLN
80.0000 mg | INTRAVENOUS | Status: AC
  Administered 2011-11-30: 80 mg via INTRAVENOUS
  Filled 2011-11-29 (×2): qty 50

## 2011-11-29 NOTE — H&P (Signed)
History of Present Illness   Sonya Lopez has symptomatic prolapse. She had a small grade 3 cystocele and her cervix descended approximately 4 cm and a grade 2 rectocele. She had mild mixed stress urge incontinence due to frequency and moderate nocturia. I felt she likely best benefit from a transvaginal hysterectomy and vaults suspension, cystocele repair and graft and posterior repair plus or minus a sling. She was here to discuss her urodynamics.   On uroflowmetry she voided 73 mL with a maximum flow of 6.9 mL per second with a prolonged pattern. Maximum capacity is 204 mL. She had sensory urgency. Her bladder was unstable reaching pressures of 7 cm of water. She leaked a mild amount with a Valsalva leak-point pressure of 115 cm of water. During voluntary voiding she voided 204 mL with a maximum flow of 20 mL per second. Maximum voiding pressure is 17 cm of water. She emptied efficiently. EMG activity was normal. Bladder neck descended 2 cm. She did not leak with the instability. She had no stress incontinence after her prolapse was reduced. The details of the urodynamics are signed and dictated on the urodynamic sheet.   Review of systems: No change in bowel or neurologic status.    Past Medical History Problems  1. History of  Esophageal Reflux 530.81 2. History of  Fibromyalgia 729.1 3. History of  Nephrolithiasis V13.01 4. History of  Sinus Arrhythmia 427.9  Surgical History Problems  1. History of  Cesarean Section 2. History of  Cholecystectomy 3. History of  Dilation And Curettage 4. History of  Tonsillectomy  Current Meds 1. Amiodarone HCl 200 MG Oral Tablet; Therapy: (Recorded:12Jun2013) to 2. Aspirin TABS; Therapy: (Recorded:12Jun2013) to 3. Calcium TABS; Therapy: (Recorded:12Jun2013) to 4. Clarinex TABS; Therapy: (Recorded:12Jun2013) to 5. ClonazePAM TABS; Therapy: (Recorded:12Jun2013) to 6. Estrace 0.1 MG/GM Vaginal Cream; Therapy: (Recorded:12Jun2013) to 7. Evoclin 1  % External Foam; Therapy: (Recorded:12Jun2013) to 8. Finacea 15 % External Gel; Therapy: (Recorded:12Jun2013) to 9. Fluvirin INJ; Therapy: (Recorded:12Jun2013) to 10. Imipramine HCl 25 MG Oral Tablet; Therapy: (Recorded:12Jun2013) to 11. Metoprolol Tartrate TABS; Therapy: (Recorded:12Jun2013) to 12. Mobic TABS; Therapy: (Recorded:12Jun2013) to 13. Naratriptan HCl TABS; Therapy: (Recorded:12Jun2013) to 14. NexIUM 40 MG Oral Capsule Delayed Release; Therapy: (Recorded:12Jun2013) to 15. SUMAtriptan Succinate 100 MG Oral Tablet; Therapy: (Recorded:12Jun2013) to  Allergies Medication  1. Cleocin CAPS 2. Erythromycin TABS 3. Flagyl TABS 4. Keflex CAPS 5. Doxycycline Hyclate CAPS 6. Macrodantin CAPS Non-Medication  7. Latex  Family History Problems  1. Family history of  Death In The Family Father age 33-stroke, CHF 2. Maternal history of  Diabetes Mellitus V18.0 3. Family history of  Family Health Status Number Of Children 3 daughters 4. Paternal history of  Heart Disease V17.49 5. Maternal history of  Hypertension V17.49 6. Maternal history of  Urinary Incontinence  Social History Problems  1. Marital History - Currently Married 2. Never A Smoker 3. 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 History of  Alcohol Use 5. History of  Caffeine Use 6. History of  Tobacco Use 305.1  Assessment Assessed  1. Cystocele 596.89 2. Urge And Stress Incontinence 788.33  Plan Cystocele (596.89)  1. Follow-up Schedule Surgery Office  Follow-up  Done: 08Jul2013  Discussion/Summary   I drew Sonya Lopez and her daughter a picture I went over watchful waiting verse a pessary. If we are only going to treat her incontinence medical and behavioral therapy and not surgery. She is on imipramine and wears one pad a day.   I discussed a  transvaginal hysterectomy with vault suspension and cystocele repair and graft and posterior repair.  I drew her a picture and we talked about prolapse  surgery in detail. Pros, cons, general surgical and anesthetic risks, and other options including behavioral therapy, pessaries, and watchful waiting were discussed. She understands that prolapse repairs are successful in 80-85% of cases for prolapse symptoms and can recur anteriorly, posteriorly, and/or apically. She understands that in most cases I use a graft and general risks were discussed. Surgical risks were described but not limited to the discussion of injury to neighboring structures including the bowel (with possible life-threatening sepsis and colostomy), bladder, urethra, vagina (all resulting in further surgery), and ureter (resulting in re-implantation). We talked about injury to nerves/soft tissue leading to debilitating and intractable pelvic, abdominal, and lower extremity pain syndromes and neuropathies. The risks of buttock pain, intractable dyspareunia, and vaginal narrowing and shortening with sequelae were discussed. Bleeding risks, transfusion rates, and infection were discussed. The risk of persistent, de novo, or worsening bladder and/or bowel incontinence/dysfunction was discussed. The need for CIC was described as well the usual post-operative course. The patient understands that she might not reach her treatment goal and that she might be worse following surgery.  I talked to her about a sling.  We talked about a sling in detail. Pros, cons, general surgical and anesthetic risks, and other options including behavioral therapy and watchful waiting were discussed. She understands that slings are generally successful in 90% of cases for stress incontinence, 50% for urge incontinence, and that in a small percentage of cases the incontinence can worsen. The risk of persistent, de novo, or worsening incontinence/dysfunction was discussed. Risks were described but not limited to the discussion of injury to neighboring structures including the bowel (with possible life-threatening sepsis and  colostomy), bladder, urethra, vagina (all resulting in further surgery), and ureter (resulting in re-implantation). We also talked about the risk of retention requiring urethrolysis, extrusion requiring revision, and erosion resulting in further surgery. Bleeding risks and transfusion rates and the risk of infection were discussed. The risk of pelvic and abdominal pain syndromes, dyspareunia, and neuropathies were discussed. The need for CIC was described as well the usual post-operative course. The patient understands that she might not reach her treatment goal and that she might be worse following surgery.  Sonya Lopez has minimal outlet abnormality and certainly the sling if anything will be a little bit on the looser side.  She would like to proceed with the above surgery including a sling. She does have a number of allergies and I will order Gentamicin and check on Dr. Marice Potter and what she is giving as well. She may have given cephalosporin.   After a thorough review of the management options for the patient's condition the patient  elected to proceed with surgical therapy as noted above. We have discussed the potential benefits and risks of the procedure, side effects of the proposed treatment, the likelihood of the patient achieving the goals of the procedure, and any potential problems that might occur during the procedure or recuperation. Informed consent has been obtained.

## 2011-11-30 ENCOUNTER — Encounter (HOSPITAL_COMMUNITY): Payer: Self-pay | Admitting: Anesthesiology

## 2011-11-30 ENCOUNTER — Observation Stay (HOSPITAL_COMMUNITY)
Admission: AD | Admit: 2011-11-30 | Discharge: 2011-12-01 | Disposition: A | Discharge status: Home / Self Care | Payer: PRIVATE HEALTH INSURANCE | Source: Ambulatory Visit | Attending: Obstetrics & Gynecology | Admitting: Obstetrics & Gynecology

## 2011-11-30 ENCOUNTER — Encounter (HOSPITAL_COMMUNITY): Payer: Self-pay

## 2011-11-30 ENCOUNTER — Encounter (HOSPITAL_COMMUNITY)
Admission: AD | Disposition: A | Discharge status: Home / Self Care | Payer: Self-pay | Source: Ambulatory Visit | Attending: Obstetrics & Gynecology

## 2011-11-30 ENCOUNTER — Inpatient Hospital Stay (HOSPITAL_COMMUNITY): Payer: PRIVATE HEALTH INSURANCE | Admitting: Anesthesiology

## 2011-11-30 DIAGNOSIS — N812 Incomplete uterovaginal prolapse: Principal | ICD-10-CM | POA: Insufficient documentation

## 2011-11-30 DIAGNOSIS — Z90710 Acquired absence of both cervix and uterus: Secondary | ICD-10-CM | POA: Insufficient documentation

## 2011-11-30 DIAGNOSIS — N393 Stress incontinence (female) (male): Secondary | ICD-10-CM | POA: Insufficient documentation

## 2011-11-30 HISTORY — PX: VAGINAL HYSTERECTOMY: SHX2639

## 2011-11-30 HISTORY — PX: CYSTOSCOPY: SHX5120

## 2011-11-30 HISTORY — PX: BLADDER SUSPENSION: SHX72

## 2011-11-30 HISTORY — PX: ANTERIOR AND POSTERIOR REPAIR: SHX5121

## 2011-11-30 SURGERY — Surgical Case
Anesthesia: *Unknown

## 2011-11-30 SURGERY — HYSTERECTOMY, VAGINAL
Anesthesia: General | Site: Vagina | Wound class: Clean Contaminated

## 2011-11-30 MED ORDER — LIDOCAINE HCL (CARDIAC) 20 MG/ML IV SOLN
INTRAVENOUS | Status: DC | PRN
  Administered 2011-11-30: 20 mg via INTRAVENOUS

## 2011-11-30 MED ORDER — DEXAMETHASONE SODIUM PHOSPHATE 10 MG/ML IJ SOLN
INTRAMUSCULAR | Status: AC
  Filled 2011-11-30: qty 1

## 2011-11-30 MED ORDER — SCOPOLAMINE 1 MG/3DAYS TD PT72
1.0000 | MEDICATED_PATCH | Freq: Once | TRANSDERMAL | Status: DC
Start: 2011-11-30 — End: 2011-11-30
  Administered 2011-11-30: 1.5 mg via TRANSDERMAL

## 2011-11-30 MED ORDER — MIDAZOLAM HCL 5 MG/5ML IJ SOLN
INTRAMUSCULAR | Status: DC | PRN
  Administered 2011-11-30: 2 mg via INTRAVENOUS

## 2011-11-30 MED ORDER — INDIGOTINDISULFONATE SODIUM 8 MG/ML IJ SOLN
INTRAMUSCULAR | Status: AC
  Filled 2011-11-30: qty 5

## 2011-11-30 MED ORDER — PROMETHAZINE HCL 25 MG/ML IJ SOLN
INTRAMUSCULAR | Status: AC
  Administered 2011-11-30: 6.25 mg via INTRAVENOUS
  Filled 2011-11-30: qty 1

## 2011-11-30 MED ORDER — INDIGOTINDISULFONATE SODIUM 8 MG/ML IJ SOLN
INTRAMUSCULAR | Status: DC | PRN
  Administered 2011-11-30 (×2): 40 mg via INTRAVENOUS

## 2011-11-30 MED ORDER — FENTANYL CITRATE 0.05 MG/ML IJ SOLN
INTRAMUSCULAR | Status: AC
  Filled 2011-11-30: qty 2

## 2011-11-30 MED ORDER — LACTATED RINGERS IV SOLN: INTRAVENOUS | Status: DC

## 2011-11-30 MED ORDER — SIMETHICONE 80 MG PO CHEW: 80.0000 mg | CHEWABLE_TABLET | Freq: Four times a day (QID) | ORAL | Status: DC | PRN

## 2011-11-30 MED ORDER — FENTANYL CITRATE 0.05 MG/ML IJ SOLN
INTRAMUSCULAR | Status: AC
  Filled 2011-11-30: qty 5

## 2011-11-30 MED ORDER — CEFAZOLIN SODIUM-DEXTROSE 2-3 GM-% IV SOLR
INTRAVENOUS | Status: AC
  Filled 2011-11-30: qty 50

## 2011-11-30 MED ORDER — ROCURONIUM BROMIDE 50 MG/5ML IV SOLN
INTRAVENOUS | Status: AC
  Filled 2011-11-30: qty 1

## 2011-11-30 MED ORDER — SODIUM CHLORIDE 0.9 % IJ SOLN
INTRAMUSCULAR | Status: DC | PRN
  Administered 2011-11-30: 50 mL via INTRAVENOUS

## 2011-11-30 MED ORDER — OXYCODONE-ACETAMINOPHEN 5-325 MG PO TABS
1.0000 | ORAL_TABLET | ORAL | Status: DC | PRN
  Administered 2011-11-30: 1 via ORAL
  Administered 2011-11-30: 2 via ORAL
  Administered 2011-12-01 (×3): 1 via ORAL
  Filled 2011-11-30 (×2): qty 1
  Filled 2011-11-30: qty 2
  Filled 2011-11-30 (×2): qty 1

## 2011-11-30 MED ORDER — SUCCINYLCHOLINE CHLORIDE 20 MG/ML IJ SOLN
INTRAMUSCULAR | Status: DC | PRN
  Administered 2011-11-30: 100 mg via INTRAVENOUS

## 2011-11-30 MED ORDER — GLYCOPYRROLATE 0.2 MG/ML IJ SOLN
INTRAMUSCULAR | Status: DC | PRN
  Administered 2011-11-30: 0.3 mg via INTRAVENOUS
  Administered 2011-11-30: 0.2 mg via INTRAVENOUS
  Administered 2011-11-30: 0.4 mg via INTRAVENOUS

## 2011-11-30 MED ORDER — ONDANSETRON HCL 4 MG/2ML IJ SOLN
INTRAMUSCULAR | Status: AC
  Filled 2011-11-30: qty 2

## 2011-11-30 MED ORDER — FLEET ENEMA 7-19 GM/118ML RE ENEM: 1.0000 | ENEMA | Freq: Once | RECTAL | Status: DC

## 2011-11-30 MED ORDER — LIDOCAINE-EPINEPHRINE (PF) 1 %-1:200000 IJ SOLN
INTRAMUSCULAR | Status: AC
  Filled 2011-11-30: qty 10

## 2011-11-30 MED ORDER — LIDOCAINE HCL (CARDIAC) 20 MG/ML IV SOLN
INTRAVENOUS | Status: AC
  Filled 2011-11-30: qty 5

## 2011-11-30 MED ORDER — STERILE WATER FOR IRRIGATION IR SOLN
Status: DC | PRN
  Administered 2011-11-30: 3000 mL

## 2011-11-30 MED ORDER — BUPIVACAINE-EPINEPHRINE (PF) 0.5% -1:200000 IJ SOLN
INTRAMUSCULAR | Status: AC
  Filled 2011-11-30: qty 10

## 2011-11-30 MED ORDER — PROPOFOL 10 MG/ML IV EMUL
INTRAVENOUS | Status: AC
  Filled 2011-11-30: qty 20

## 2011-11-30 MED ORDER — IBUPROFEN 800 MG PO TABS
800.0000 mg | ORAL_TABLET | Freq: Three times a day (TID) | ORAL | Status: DC | PRN
  Administered 2011-11-30 – 2011-12-01 (×3): 800 mg via ORAL
  Filled 2011-11-30 (×3): qty 1

## 2011-11-30 MED ORDER — GLYCOPYRROLATE 0.2 MG/ML IJ SOLN
INTRAMUSCULAR | Status: AC
  Filled 2011-11-30: qty 2

## 2011-11-30 MED ORDER — DEXAMETHASONE SODIUM PHOSPHATE 4 MG/ML IJ SOLN
INTRAMUSCULAR | Status: DC | PRN
  Administered 2011-11-30: 10 mg via INTRAVENOUS

## 2011-11-30 MED ORDER — CEFAZOLIN SODIUM-DEXTROSE 2-3 GM-% IV SOLR
2.0000 g | INTRAVENOUS | Status: AC
  Administered 2011-11-30: 2 g via INTRAVENOUS

## 2011-11-30 MED ORDER — HYDROMORPHONE HCL PF 1 MG/ML IJ SOLN
INTRAMUSCULAR | Status: AC
  Administered 2011-11-30: 0.25 mg via INTRAVENOUS
  Filled 2011-11-30: qty 1

## 2011-11-30 MED ORDER — GLYCOPYRROLATE 0.2 MG/ML IJ SOLN
INTRAMUSCULAR | Status: AC
  Filled 2011-11-30: qty 1

## 2011-11-30 MED ORDER — ONDANSETRON HCL 4 MG/2ML IJ SOLN
INTRAMUSCULAR | Status: DC | PRN
  Administered 2011-11-30: 4 mg via INTRAVENOUS

## 2011-11-30 MED ORDER — FENTANYL CITRATE 0.05 MG/ML IJ SOLN
INTRAMUSCULAR | Status: DC | PRN
  Administered 2011-11-30: 100 ug via INTRAVENOUS
  Administered 2011-11-30 (×3): 50 ug via INTRAVENOUS

## 2011-11-30 MED ORDER — ROCURONIUM BROMIDE 100 MG/10ML IV SOLN
INTRAVENOUS | Status: DC | PRN
  Administered 2011-11-30: 25 mg via INTRAVENOUS
  Administered 2011-11-30: 5 mg via INTRAVENOUS

## 2011-11-30 MED ORDER — ZOLPIDEM TARTRATE 5 MG PO TABS: 5.0000 mg | ORAL_TABLET | Freq: Every evening | ORAL | Status: DC | PRN

## 2011-11-30 MED ORDER — SODIUM CHLORIDE 0.9 % IR SOLN
Freq: Once | Status: DC
  Filled 2011-11-30: qty 1

## 2011-11-30 MED ORDER — LIDOCAINE-EPINEPHRINE (PF) 1 %-1:200000 IJ SOLN
INTRAMUSCULAR | Status: DC | PRN
  Administered 2011-11-30: 20 mL
  Administered 2011-11-30: 3 mL
  Administered 2011-11-30: 16 mL

## 2011-11-30 MED ORDER — HYDROMORPHONE HCL PF 1 MG/ML IJ SOLN
0.2500 mg | INTRAMUSCULAR | Status: DC | PRN
  Administered 2011-11-30: 0.25 mg via INTRAVENOUS

## 2011-11-30 MED ORDER — LACTATED RINGERS IV SOLN
INTRAVENOUS | Status: DC
  Administered 2011-11-30 (×2): via INTRAVENOUS

## 2011-11-30 MED ORDER — PROMETHAZINE HCL 25 MG/ML IJ SOLN
12.5000 mg | Freq: Four times a day (QID) | INTRAMUSCULAR | Status: DC | PRN
  Administered 2011-11-30: 6.25 mg via INTRAVENOUS

## 2011-11-30 MED ORDER — NEOSTIGMINE METHYLSULFATE 1 MG/ML IJ SOLN
INTRAMUSCULAR | Status: AC
  Filled 2011-11-30: qty 10

## 2011-11-30 MED ORDER — BUPIVACAINE-EPINEPHRINE 0.5% -1:200000 IJ SOLN
INTRAMUSCULAR | Status: DC | PRN
  Administered 2011-11-30: 80 mL

## 2011-11-30 MED ORDER — PROPOFOL 10 MG/ML IV EMUL
INTRAVENOUS | Status: DC | PRN
  Administered 2011-11-30: 180 mg via INTRAVENOUS
  Administered 2011-11-30: 20 mg via INTRAVENOUS

## 2011-11-30 MED ORDER — MIDAZOLAM HCL 2 MG/2ML IJ SOLN
INTRAMUSCULAR | Status: AC
  Filled 2011-11-30: qty 2

## 2011-11-30 MED ORDER — SCOPOLAMINE 1 MG/3DAYS TD PT72
MEDICATED_PATCH | TRANSDERMAL | Status: AC
  Administered 2011-11-30: 1.5 mg via TRANSDERMAL
  Filled 2011-11-30: qty 1

## 2011-11-30 MED ORDER — NEOSTIGMINE METHYLSULFATE 1 MG/ML IJ SOLN
INTRAMUSCULAR | Status: DC | PRN
  Administered 2011-11-30: 3 mg via INTRAVENOUS

## 2011-11-30 MED ORDER — ESTRADIOL 0.1 MG/GM VA CREA
TOPICAL_CREAM | VAGINAL | Status: AC
  Filled 2011-11-30: qty 42.5

## 2011-11-30 SURGICAL SUPPLY — 65 items
BLADE SURG 10 STRL SS (BLADE) ×8 IMPLANT
BLADE SURG 15 STRL LF C SS BP (BLADE) ×3 IMPLANT
BLADE SURG 15 STRL SS (BLADE) ×1
CANISTER SUCTION 2500CC (MISCELLANEOUS) ×8 IMPLANT
CATH FOLEY 2WAY SLVR  5CC 16FR (CATHETERS)
CATH FOLEY 2WAY SLVR 5CC 16FR (CATHETERS) IMPLANT
CATH ROBINSON RED A/P 16FR (CATHETERS) IMPLANT
CLOTH BEACON ORANGE TIMEOUT ST (SAFETY) ×4 IMPLANT
CONT PATH 16OZ SNAP LID 3702 (MISCELLANEOUS) ×4 IMPLANT
DECANTER SPIKE VIAL GLASS SM (MISCELLANEOUS) ×4 IMPLANT
DERMABOND ADVANCED (GAUZE/BANDAGES/DRESSINGS) ×1
DERMABOND ADVANCED .7 DNX12 (GAUZE/BANDAGES/DRESSINGS) ×3 IMPLANT
DEVICE CAPIO SLIM SINGLE (INSTRUMENTS) IMPLANT
DRAIN PENROSE 1/4X12 LTX (DRAIN) ×4 IMPLANT
DRAPE UNDERBUTTOCKS STRL (DRAPE) ×4 IMPLANT
DRESSING TELFA 8X3 (GAUZE/BANDAGES/DRESSINGS) IMPLANT
GAUZE PACKING 2X5 YD STERILE (GAUZE/BANDAGES/DRESSINGS) IMPLANT
GAUZE SPONGE 4X4 16PLY XRAY LF (GAUZE/BANDAGES/DRESSINGS) ×16 IMPLANT
GLOVE BIO SURGEON STRL SZ 6.5 (GLOVE) ×8 IMPLANT
GLOVE BIO SURGEON STRL SZ7.5 (GLOVE) ×12 IMPLANT
GLOVE BIO SURGEON STRL SZ8 (GLOVE) ×4 IMPLANT
GLOVE BIOGEL PI IND STRL 6.5 (GLOVE) ×6 IMPLANT
GLOVE BIOGEL PI INDICATOR 6.5 (GLOVE) ×2
GOWN PREVENTION PLUS LG XLONG (DISPOSABLE) ×16 IMPLANT
GOWN STRL REIN XL XLG (GOWN DISPOSABLE) ×4 IMPLANT
NEEDLE HYPO 22GX1.5 SAFETY (NEEDLE) ×4 IMPLANT
NEEDLE MAYO .5 CIRCLE (NEEDLE) ×4 IMPLANT
NEEDLE MAYO 6 CRC TAPER PT (NEEDLE) IMPLANT
NEEDLE SPNL 18GX3.5 QUINCKE PK (NEEDLE) ×4 IMPLANT
NEEDLE SPNL 22GX3.5 QUINCKE BK (NEEDLE) IMPLANT
NS IRRIG 1000ML POUR BTL (IV SOLUTION) ×8 IMPLANT
PACK VAGINAL WOMENS (CUSTOM PROCEDURE TRAY) ×4 IMPLANT
PAD OB MATERNITY 4.3X12.25 (PERSONAL CARE ITEMS) ×4 IMPLANT
PENCIL BUTTON HOLSTER BLD 10FT (ELECTRODE) ×4 IMPLANT
PLUG CATH AND CAP STER (CATHETERS) ×4 IMPLANT
RETRACTOR STAY HOOK 5MM (MISCELLANEOUS) ×4 IMPLANT
SET CYSTO W/LG BORE CLAMP LF (SET/KITS/TRAYS/PACK) ×4 IMPLANT
SHEET LAVH (DRAPES) ×4 IMPLANT
STRIP CLOSURE SKIN 1/2X4 (GAUZE/BANDAGES/DRESSINGS) IMPLANT
SUT CAPIO ETHIBPND (SUTURE) ×8 IMPLANT
SUT SILK 2 0 FS (SUTURE) IMPLANT
SUT VIC AB 0 CT1 27 (SUTURE) ×4
SUT VIC AB 0 CT1 27XBRD ANBCTR (SUTURE) ×12 IMPLANT
SUT VIC AB 0 CT1 36 (SUTURE) IMPLANT
SUT VIC AB 0 CT2 27 (SUTURE) IMPLANT
SUT VIC AB 2-0 CT1 (SUTURE) ×8 IMPLANT
SUT VIC AB 2-0 CT1 18 (SUTURE) ×12 IMPLANT
SUT VIC AB 2-0 CT1 27 (SUTURE) ×2
SUT VIC AB 2-0 CT1 TAPERPNT 27 (SUTURE) ×6 IMPLANT
SUT VIC AB 2-0 CT2 27 (SUTURE) IMPLANT
SUT VIC AB 2-0 SH 27 (SUTURE) ×5
SUT VIC AB 2-0 SH 27XBRD (SUTURE) ×15 IMPLANT
SUT VIC AB 3-0 CT1 27 (SUTURE) ×1
SUT VIC AB 3-0 CT1 TAPERPNT 27 (SUTURE) ×3 IMPLANT
SUT VIC AB 3-0 PS2 18 (SUTURE)
SUT VIC AB 3-0 PS2 18XBRD (SUTURE) IMPLANT
SUT VIC AB 4-0 PS2 27 (SUTURE) ×4 IMPLANT
SUT VICRYL 0 TIES 12 18 (SUTURE) IMPLANT
SUT VICRYL 0 UR6 27IN ABS (SUTURE) IMPLANT
Sparc Sling System ×4 IMPLANT
TOWEL OR 17X24 6PK STRL BLUE (TOWEL DISPOSABLE) ×8 IMPLANT
TRAY FOLEY CATH 14FR (SET/KITS/TRAYS/PACK) ×8 IMPLANT
TUBING CONNECTING 10 (TUBING) IMPLANT
TUBING NON-CON 1/4 X 20 CONN (TUBING) ×4 IMPLANT
WATER STERILE IRR 1000ML POUR (IV SOLUTION) ×8 IMPLANT

## 2011-11-30 NOTE — Addendum Note (Signed)
Addendum  created 11/30/11 1632 by Algis Greenhouse, CRNA   Modules edited:Notes Section

## 2011-11-30 NOTE — Op Note (Signed)
Preoperative diagnosis: Vault prolapse, cystocele, rectocele, mild stress incontinence Postoperative diagnosis: Mild vault prolapse, cystocele, rectocele, mild stress incontinence Surgery: Cystocele and rectocele repair; sling cystourethropexy [facility]) and cystoscopy Surgeon: Dr. Lorin Picket Christana Angelica Assistant: Pecola Leisure  The patient has the above diagnoses and consented the above procedure. She also had uterine prolapse. Dr. Renata Caprice had removed the uterus and ovaries and ran the cuff posteriorly but left the cuff open  Leg position was good to minimize the risk of compartment syndrome and neuropathy and deep vein thrombosis. Uterosacral ligaments were tagged. Preoperative laboratory tests were normal. Preoperative antibiotics were given. Time out was performed  With careful inspection the cuff was well supported and she had a grade 2 cystocele that was modest. Her cuff was ashy quite well supported with minimal loss of April support with the uterus removed. I did not think she would need a vault prolapse or graft  With my usual technique I instilled 20 cc of a lidocaine epinephrine mixture. I made a midline incision not infringing upon the bladder neck and urethra. I sharply dissected the overlying anterior vaginal wall from the underlying pubocervical fascia to the white line bilaterally.  I did an appropriate 2 layer imbrication running with 2-0 Vicryl not imbricating the bladder neck. There was excellent reduction of the cystocele with very good support even apically.  I cystoscoped the patient and there were excellent blue jets bilaterally. There is no injury to bladder. Is no distortion of the ureters or trigone. Cystoscopically the repair looked good  I trimmed an appropriate amount of anterior vaginal wall mucosa and closed with 2-0 Vicryl running suture.  I then closed the apex from left to right and right to left starting at the to apices. I used 0 Vicryl. I approximated the  tagged  ureteral sacral ligaments as well.  I was pleased with the apical support but she grade 2 rectocele. She also had deficiency of the posterior fourchette always making the retractor more difficult to sit in the vagina throughout the case.  I instilled approximately 20 cc of a lidocaine epinephrine mixture. I removed a small triangle of perineal skin and she had a short perineal body. Allis clamps were placed on the hymenal ring. I made a long posterior midline incision to the apex. I sharply dissected the rectovaginal fascia from the overlying vaginal wall mucosa. I dissected and mobilized well at the apex and laterally. I did a rectal examination and she diffuse thinning of the rectovaginal fascia but not in obvious site defect though there was more weakness near the apex. There was no tissue to do a trapdoor repair.  I did a posterior repair with running 2-0 Vicryl on a SH needle. She had very impressive almost varicose vein like veins that were in the standing as I did the repair. I was very happy with the healthy bites. Because there was so many veins I did not want to cause bleeding so I did not do a second layer which I often due to strength in the first layer. I was happy with the strength. I did a rectal examination and was appropriate reduction of the rectocele with no injury to rectum  I closed the posterior vaginal wall after removing approximately 2 mm of posterior vaginal mucosa.   She very good length and good support anteriorly and posteriorly.  I then performed a sling. 21 cm incisions were made 2 cm lateral to the midline 1 fingerbreadth above the symphysis pubis. With good exposure I made  a 2 cm suburethral incision after instilling a few cc of a lidocaine epinephrine mixture. I sharply and bluntly dissected to the urethrovesical angle bilaterally  With the bladder emptied I passed a SPARC needle on top of and then along the back the symphysis pubis staying lateral. With my usual box  technique I delivered each needle onto the pulp of my index finger bilaterally.  I then cystoscoped the patient and there was no injury to bladder. There is no movement of bladder with movement of the trocar. The urethra was normal. There was excellent blue jets bilaterally  With the bladder emptied I attached the SPARC sling and brought it up through the retropubic space. I tensioned over the fat part of a moderate size Kelly clamp with my usual technique. I cut below the blue dots irrigated the sheaths and remove the sheath. I was very happy with the hypermobility of the sling in its mid urethral position. It was a few millimeters on the looser side as planned. There is appropriate hypermobility with no springback effect  After irrigating again I closed the anterior vaginal wall with running 2-0 Vicryl on a CT1 needle. My 2 interrupted sutures were utilized. The sling and the abdominal wall was cut in the close with 4-0 Vicryl followed by Dermabond.  Vaginal pack with Estrace cream was applied. Leg position was good. Patient taken to recover room. Hopefully the operation will reach the patient's treatment goal

## 2011-11-30 NOTE — OR Nursing (Signed)
Dr. McDermitt to OR at [phone]

## 2011-11-30 NOTE — OR Nursing (Signed)
Dr. McDermitt out of room ar 1025 with his PA

## 2011-11-30 NOTE — H&P (Signed)
Sonya Lopez is a 56 yo MW G3P3 [phone]) here because of symptomatic uterine prolapse for at least 2 years. She tried a pessary for several months but did not like it. She also complains of GSUI for at least 5 years. She has about 3 occasions of nocturia per night. She is not sexually active (for about a year) due to her husband's ED. She has been menopausal since age 81.      Menstrual History: Menarche age: 4 No LMP recorded. Patient is postmenopausal.    Past Medical History  Diagnosis Date  . Headache   . Allergy   . Female cystocele 01/05/2011  . Obesity   . GERD (gastroesophageal reflux disease)   . Arrhythmia     A-fib/A-flutter  . Anxiety     situational anxiety  . Fibromyalgia   . PONV (postoperative nausea and vomiting)     h/o difficult intubation and anes had to use "boogee"  . Difficult intubation 2009    [facility]- anes had to use  'boogee"    Past Surgical History  Procedure Date  . Gallbladder surgery 2004  . Dilation and curettage of uterus 05/05/2007  . Tonsillectomy 1961  . Cholecystectomy   . Cesarean section     x 1    Family History  Problem Relation Age of Onset  . Diabetes Mother   . Hearing loss Mother   . Heart disease Father   . Heart disease Paternal Grandfather     Social History:  reports that she has never smoked. She does not have any smokeless tobacco history on file. She reports that she does not drink alcohol or use illicit drugs.  Allergies:  Allergies  Allergen Reactions  . Adhesive (Tape) Dermatitis  . Macrodantin (Nitrofurantoin) Nausea And Vomiting  . Cephalexin Rash  . Clindamycin Hcl Rash  . Doxycycline Swelling and Rash  . Erythromycin Rash    Prescriptions prior to admission  Medication Sig Dispense Refill  . amiodarone (PACERONE) 200 MG tablet Take 400 mg by mouth as needed. Takes with an extra metoprolol when heart goes out of rhythm      . aspirin EC 81 MG tablet Take 81 mg by mouth  daily.      . Azelaic Acid (FINACEA) 15 % cream Apply 1 application topically 2 (two) times daily as needed. For rosacea      . Calcium Citrate-Vitamin D 500-400 MG-UNIT CHEW Chew 1 tablet by mouth daily.      . clonazePAM (KLONOPIN) 0.5 MG tablet Take 0.25 mg by mouth 2 (two) times daily as needed. For stress      . desloratadine (CLARINEX) 5 MG tablet Take 5 mg by mouth daily.        Marland Kitchen esomeprazole (NEXIUM) 40 MG capsule Take 40 mg by mouth at bedtime.       Marland Kitchen estradiol (ESTRACE) 0.1 MG/GM vaginal cream Place 1/2 a gram into vagina every other night.  42.5 g  12  . imipramine (TOFRANIL) 25 MG tablet Take 25 mg by mouth at bedtime.        . meloxicam (MOBIC) 15 MG tablet Take 15 mg by mouth at bedtime.       . metoprolol tartrate (LOPRESSOR) 25 MG tablet Take 1 tablet (25 mg total) by mouth 2 (two) times daily.  60 tablet  6  . Multiple Vitamin (MULTIVITAMIN WITH MINERALS) TABS Take 1 tablet by mouth daily.      . naratriptan (AMERGE) 2.5 MG  tablet Take 2.5 mg by mouth as needed. For migraine. Alternates with sumatriptan.      . Probiotic Product (PROBIOTIC PO) Take 1 capsule by mouth daily.       . SUMAtriptan (IMITREX) 100 MG tablet Take 100 mg by mouth as needed. For migraine. Alternates with naratriptan.        ROS Married for 35 years, homemaker, homeschooler  Blood pressure 117/75, pulse 75, temperature 98.1 F (36.7 C), temperature source Oral, resp. rate 16, height 5\' 6"  (1.676 m), weight 90.719 kg (200 lb), SpO2 98.00%. Physical Exam Heart- rrr Lungs- CTAB Abd- benign  No results found for this or any previous visit (from the past 24 hour(s)).  No results found.  Assessment/Plan: Symptomatic prolapse- I will plan for a TVH/BSO. Because she has had a C/S, I have consented her for a possible L/S, although I don't think it will likely be needed. She understands the risks of surgery, including, but not limited to infection, bleeding, damage to bowel, bladder, ureters,  DVTs.  Laneice Meneely C. 11/30/2011, 7:05 AM

## 2011-11-30 NOTE — Anesthesia Postprocedure Evaluation (Signed)
  Anesthesia Post-op Note  Patient: Sonya Lopez  Procedure(s) Performed: Procedure(s) (LRB) with comments: HYSTERECTOMY VAGINAL (N/A) ANTERIOR (CYSTOCELE) AND POSTERIOR REPAIR (RECTOCELE) (N/A) - with cysto SPARC PROCEDURE (N/A) - graft 10x6 CYSTOSCOPY ()  Patient Location: [facility]  Anesthesia Type:General  Level of Consciousness: sedated  Airway and Oxygen Therapy: Patient Spontanous Breathing  Post-op Pain: none  Post-op Assessment: Post-op Vital signs reviewed  Post-op Vital Signs: Reviewed and stable  Complications: No apparent anesthesia complications

## 2011-11-30 NOTE — Anesthesia Preprocedure Evaluation (Signed)
Anesthesia Evaluation  Patient identified by MRN, date of birth, ID band Patient awake    Reviewed: Allergy & Precautions, H&P , Patient's Chart, lab work & pertinent test results, reviewed documented beta blocker date and time   History of Anesthesia Complications (+) PONV and DIFFICULT AIRWAY  Airway Mallampati: II TM Distance: >3 FB Neck ROM: full    Dental No notable dental hx.    Pulmonary  breath sounds clear to auscultation  Pulmonary exam normal       Cardiovascular + dysrhythmias Supra Ventricular Tachycardia Rhythm:regular Rate:Normal     Neuro/Psych    GI/Hepatic Medicated,  Endo/Other    Renal/GU      Musculoskeletal   Abdominal   Peds  Hematology   Anesthesia Other Findings   Reproductive/Obstetrics                           Anesthesia Physical Anesthesia Plan  ASA: III  Anesthesia Plan: General   Post-op Pain Management:    Induction: Intravenous  Airway Management Planned: Oral ETT, Video Laryngoscope Planned and LMA C-Track Planned  Additional Equipment:   Intra-op Plan:   Post-operative Plan:   Informed Consent: I have reviewed the patients History and Physical, chart, labs and discussed the procedure including the risks, benefits and alternatives for the proposed anesthesia with the patient or authorized representative who has indicated his/her understanding and acceptance.   Dental Advisory Given and Dental advisory given  Plan Discussed with: CRNA and Surgeon  Anesthesia Plan Comments: (  Discussed  general anesthesia, including possible nausea, instrumentation of airway, sore throat,pulmonary aspiration, etc. I asked if the were any outstanding questions, or  concerns before we proceeded. )        Anesthesia Quick Evaluation

## 2011-11-30 NOTE — Transfer of Care (Signed)
Immediate Anesthesia Transfer of Care Note  Patient: Sonya Lopez  Procedure(s) Performed: Procedure(s) (LRB) with comments: HYSTERECTOMY VAGINAL (N/A) ANTERIOR (CYSTOCELE) AND POSTERIOR REPAIR (RECTOCELE) (N/A) - with cysto SPARC PROCEDURE (N/A) - graft 10x6 CYSTOSCOPY ()  Patient Location: PACU  Anesthesia Type:General  Level of Consciousness: awake, sedated and patient cooperative  Airway & Oxygen Therapy: Patient Spontanous Breathing and Patient connected to nasal cannula oxygen  Post-op Assessment: Report given to PACU RN and Post -op Vital signs reviewed and stable  Post vital signs: Reviewed and stable  Complications: No apparent anesthesia complications

## 2011-11-30 NOTE — Op Note (Signed)
11/30/2011  8:46 AM  PATIENT:  Sonya Lopez  56 y.o. female  PRE-OPERATIVE DIAGNOSIS:  Uterine prolapse;cystocele;rectocele  POST-OPERATIVE DIAGNOSIS:  Uterine prolapse;cystocele;rectocele  PROCEDURE:  Total vaginal hysterectomy, bilateral salpingo opherectomy SURGEON:  Surgeon(s) and Role: Panel 1:    * Allie Bossier, MD - Primary    *   Panel 2:    * Martina Sinner, MD - Primary  PHYSICIAN ASSISTANT:   ASSISTANTS: Peggy Constant, MD   ANESTHESIA:   general  EBL:  Total I/O In: 1000 [I.V.:1000] Out: 250 [Urine:200; Blood:50]  FINDINGS: small uterus with 3rd degree prolapse, atrophic ovaries, normal appearing tubes  BLOOD ADMINISTERED:none  DRAINS: none   LOCAL MEDICATIONS USED:  MARCAINE     SPECIMEN:  Source of Specimen:  uterus, tubes, ovaries  DISPOSITION OF SPECIMEN:  PATHOLOGY  COUNTS:  YES  TOURNIQUET:  * No tourniquets in log *  DICTATION: .Dragon Dictation  PLAN OF CARE: Admit for overnight observation  PATIENT DISPOSITION:  PACU - hemodynamically stable.   Delay start of Pharmacological VTE agent (>24hrs) due to surgical blood loss or risk of bleeding: not applicable  The risks, benefits, alternatives of surgery were explained, understood, and accepted. All questions were answered and a consent form was signed. She was taken to the operating room and general anesthesia was applied. She was put in the dorsal lithotomy position. Her vagina and abdomen were prepped and draped in the usual sterile fashion. A timeout procedure was done. A bimanual exam revealed a small prolapsed uterus. A Foley catheter was placed and it drained clear urine throughout the case. The cervix was grasped with a single-tooth tenaculum. A total of 80 cc of dilute Marcaine was injected in a circumferential fashion at the cervicovaginal junction. An incision was made at the site. The posterior peritoneum was entered. A long weighted speculum was placed. The anterior  peritoneum was entered. A Deaver was placed anteriorly. The uterosacral ligaments were clamped, cut, and ligated. They were tagged and held. 2 Vicryl sutures used throughout this case unless otherwise specified. The small uterus was separated from its pelvic attachments using a similar clamp, cut, ligate technique. Excellent hemostasis was maintained throughout. Once the uterus was removed, the bowel was kept out of the operative site with a sponge on a stick. I was able to visualize each adnexa and grabbed them with a Babcock clamp. Using a Kelly clamp, I was able to clamp, cut, and ligate the infundibulopelvic ligaments on each side. Hemostasis was maintained. I ran a running locking stitch on the posterior aspect of the vaginal cuff to prevent bleeding for the portion of Dr. McDermiand this case. At this point he began his portion of surgery.

## 2011-11-30 NOTE — OR Nursing (Signed)
Dr. Vinnie Level potion of procedure started at 0826.

## 2011-11-30 NOTE — Anesthesia Postprocedure Evaluation (Signed)
Anesthesia Post Note  Patient: Sonya Lopez  Procedure(s) Performed: Procedure(s) (LRB): HYSTERECTOMY VAGINAL (N/A) ANTERIOR (CYSTOCELE) AND POSTERIOR REPAIR (RECTOCELE) (N/A) SPARC PROCEDURE (N/A) CYSTOSCOPY ()  Anesthesia type: General  Patient location: PACU  Post pain: Pain level controlled  Post assessment: Post-op Vital signs reviewed  Last Vitals:  Filed Vitals:   11/30/11 1145  BP: 108/40  Pulse: 54  Temp: 36.4 C  Resp: 16    Post vital signs: Reviewed  Level of consciousness: sedated  Complications: No apparent anesthesia complications

## 2011-12-01 ENCOUNTER — Encounter (HOSPITAL_COMMUNITY): Payer: Self-pay | Admitting: Obstetrics & Gynecology

## 2011-12-01 LAB — CBC
HCT: 34.7 % — ABNORMAL LOW (ref 36.0–46.0)
MCV: 92.3 fL (ref 78.0–100.0)
RDW: 14.7 % (ref 11.5–15.5)
WBC: 15.4 10*3/uL — ABNORMAL HIGH (ref 4.0–10.5)

## 2011-12-01 MED ORDER — IBUPROFEN 800 MG PO TABS: 800.0000 mg | ORAL_TABLET | Freq: Three times a day (TID) | ORAL | Status: DC | PRN

## 2011-12-01 MED ORDER — OXYCODONE-ACETAMINOPHEN 5-325 MG PO TABS: 1.0000 | ORAL_TABLET | ORAL | Status: DC | PRN

## 2011-12-01 NOTE — Discharge Summary (Signed)
Physician Discharge Summary  Patient ID: Sonya Lopez MRN: [id number] DOB/AGE: 1955/12/20 56 y.o.  Admit date: 11/30/2011 Discharge date: 12/01/2011  Admission Diagnoses: symptomatic uterine prolapse, cystocele, rectocele  Discharge Diagnoses: same Active Problems:  * No active hospital problems. *    Discharged Condition: good  Hospital Course: She underwent an uncomplicated TVH/BSO (by Dr. Marice Lopez) and a repair of cystocele/rectocele/SPARC/cystocele (by Dr. Elon Lopez). By POD #1 she was ambulating, tolerating po without nausea/vomitting, voiding and passed her voiding trial. Her post op day #1 hemoglobin was 11.2. She had normal vital signs and remained afebrile throughout her hospital stay. She was verbalizing her desire to go home.  Consults: urology  Significant Diagnostic Studies: labs: as above  Treatments: surgery:  As above  Discharge Exam: Blood pressure 102/43, pulse 68, temperature 97.9 F (36.6 C), temperature source Oral, resp. rate 18, height 5\' 6"  (1.676 m), weight 91.627 kg (202 lb), SpO2 96.00%. General appearance: alert Resp: clear to auscultation bilaterally Cardio: regular rate and rhythm, S1, S2 normal, no murmur, click, rub or gallop GI: soft, non-tender; bowel sounds normal; no masses,  no organomegaly  Disposition: Final discharge disposition not confirmed     Medication List     As of 12/01/2011 12:54 PM    TAKE these medications         amiodarone 200 MG tablet   Commonly known as: PACERONE   Take 400 mg by mouth as needed. Takes with an extra metoprolol when heart goes out of rhythm      aspirin EC 81 MG tablet   Take 81 mg by mouth daily.      Calcium Citrate-Vitamin D 500-400 MG-UNIT Chew   Chew 1 tablet by mouth daily.      clonazePAM 0.5 MG tablet   Commonly known as: KLONOPIN   Take 0.25 mg by mouth 2 (two) times daily as needed. For stress      desloratadine 5 MG tablet   Commonly known as: CLARINEX   Take 5 mg by mouth  daily.      esomeprazole 40 MG capsule   Commonly known as: NEXIUM   Take 40 mg by mouth at bedtime.      estradiol 0.1 MG/GM vaginal cream   Commonly known as: ESTRACE   Place 1/2 a gram into vagina every other night.      FINACEA 15 % cream   Generic drug: Azelaic Acid   Apply 1 application topically 2 (two) times daily as needed. For rosacea      ibuprofen 800 MG tablet   Commonly known as: ADVIL,MOTRIN   Take 1 tablet (800 mg total) by mouth every 8 (eight) hours as needed (mild pain).      imipramine 25 MG tablet   Commonly known as: TOFRANIL   Take 25 mg by mouth at bedtime.      meloxicam 15 MG tablet   Commonly known as: MOBIC   Take 15 mg by mouth at bedtime.      metoprolol tartrate 25 MG tablet   Commonly known as: LOPRESSOR   Take 1 tablet (25 mg total) by mouth 2 (two) times daily.      multivitamin with minerals Tabs   Take 1 tablet by mouth daily.      naratriptan 2.5 MG tablet   Commonly known as: AMERGE   Take 2.5 mg by mouth as needed. For migraine. Alternates with sumatriptan.      oxyCODONE-acetaminophen 5-325 MG per tablet   Commonly known  as: PERCOCET/ROXICET   Take 1-2 tablets by mouth every 4 (four) hours as needed (moderate to severe pain (when tolerating fluids)).      PROBIOTIC PO   Take 1 capsule by mouth daily.      SUMAtriptan 100 MG tablet   Commonly known as: IMITREX   Take 100 mg by mouth as needed. For migraine. Alternates with naratriptan.           Follow-up Information    Follow up with Sonya Domine C., MD. Schedule an appointment as soon as possible for a visit in 6 weeks.   Contact information:   [address] [place] [address] [phone]          Signed: [name] 12/01/2011, 12:54 PM

## 2011-12-01 NOTE — Progress Notes (Signed)
Vitals normal Laboratory tests normal Patient alert and stable Pain minimal and well-controlled Post treatment course discussed in detail Followup discussed in detail See orders 

## 2011-12-01 NOTE — Progress Notes (Signed)
Ur chart review completed.  

## 2011-12-02 ENCOUNTER — Telehealth: Payer: Self-pay | Admitting: Obstetrics & Gynecology

## 2011-12-02 NOTE — Telephone Encounter (Signed)
See phone into

## 2011-12-13 ENCOUNTER — Other Ambulatory Visit: Payer: Self-pay | Admitting: *Deleted

## 2011-12-13 MED ORDER — METOPROLOL TARTRATE 25 MG PO TABS: 25.0000 mg | ORAL_TABLET | Freq: Two times a day (BID) | ORAL | Status: DC

## 2011-12-13 NOTE — Telephone Encounter (Signed)
Refilled Metoprolol

## 2012-01-10 ENCOUNTER — Encounter: Payer: Self-pay | Admitting: Obstetrics & Gynecology

## 2012-01-10 ENCOUNTER — Ambulatory Visit (INDEPENDENT_AMBULATORY_CARE_PROVIDER_SITE_OTHER): Payer: PRIVATE HEALTH INSURANCE | Admitting: Obstetrics & Gynecology

## 2012-01-10 VITALS — BP 120/59 | HR 76 | Ht 66.0 in | Wt 206.0 lb

## 2012-01-10 DIAGNOSIS — Z09 Encounter for follow-up examination after completed treatment for conditions other than malignant neoplasm: Secondary | ICD-10-CM

## 2012-01-10 DIAGNOSIS — Z9889 Other specified postprocedural states: Secondary | ICD-10-CM

## 2012-01-10 NOTE — Progress Notes (Signed)
  Subjective:    Patient ID: Sonya Lopez, female    DOB: 23-May-1955, 56 y.o.   MRN: [id number]  HPI She is now 7 weeks post op s/p TVH/BSO (me) and SPARC/ant and post repair by Dr. Weyman Pedro. She is doing quite well. She does have some immediate need to void just after voiding, but denies GSUI. She denies any post op problems. She has not had sex yet. She did not use her vaginal estrogen post op.   Review of Systems Her mammogram is UTD and ordered at [facility] for 1/14. Her colonoscopy is UTD.    Objective:   Physical Exam  Well healed vagina. Normal bimanual exam. Pathology normal      Assessment & Plan:  . Post op-doing well. She will see Dr. Weyman Pedro in January.

## 2012-02-04 ENCOUNTER — Ambulatory Visit: Payer: Self-pay | Admitting: Family Medicine

## 2012-02-22 ENCOUNTER — Ambulatory Visit (INDEPENDENT_AMBULATORY_CARE_PROVIDER_SITE_OTHER): Payer: PRIVATE HEALTH INSURANCE | Admitting: Nurse Practitioner

## 2012-02-22 ENCOUNTER — Encounter: Payer: Self-pay | Admitting: Nurse Practitioner

## 2012-02-22 VITALS — BP 109/56 | HR 63 | Ht 66.0 in | Wt 210.0 lb

## 2012-02-22 DIAGNOSIS — G43909 Migraine, unspecified, not intractable, without status migrainosus: Secondary | ICD-10-CM

## 2012-02-22 MED ORDER — NAPROXEN SODIUM 550 MG PO TABS: 550.0000 mg | ORAL_TABLET | Freq: Two times a day (BID) | ORAL | Status: DC

## 2012-02-22 MED ORDER — PROMETHAZINE HCL 25 MG PO TABS: 25.0000 mg | ORAL_TABLET | Freq: Four times a day (QID) | ORAL | Status: DC | PRN

## 2012-02-22 MED ORDER — SUMATRIPTAN SUCCINATE 100 MG PO TABS: 100.0000 mg | ORAL_TABLET | Freq: Once | ORAL | Status: DC | PRN

## 2012-02-22 NOTE — Patient Instructions (Signed)
Migraine Headache A migraine headache is an intense, throbbing pain on one or both sides of your head. A migraine can last for 30 minutes to several hours. CAUSES  The exact cause of a migraine headache is not always known. However, a migraine may be caused when nerves in the brain become irritated and release chemicals that cause inflammation. This causes pain. SYMPTOMS  Pain on one or both sides of your head.  Pulsating or throbbing pain.  Severe pain that prevents daily activities.  Pain that is aggravated by any physical activity.  Nausea, vomiting, or both.  Dizziness.  Pain with exposure to bright lights, loud noises, or activity.  General sensitivity to bright lights, loud noises, or smells. Before you get a migraine, you may get warning signs that a migraine is coming (aura). An aura may include:  Seeing flashing lights.  Seeing bright spots, halos, or zig-zag lines.  Having tunnel vision or blurred vision.  Having feelings of numbness or tingling.  Having trouble talking.  Having muscle weakness. MIGRAINE TRIGGERS  Alcohol.  Smoking.  Stress.  Menstruation.  Aged cheeses.  Foods or drinks that contain nitrates, glutamate, aspartame, or tyramine.  Lack of sleep.  Chocolate.  Caffeine.  Hunger.  Physical exertion.  Fatigue.  Medicines used to treat chest pain (nitroglycerine), birth control pills, estrogen, and some blood pressure medicines. DIAGNOSIS  A migraine headache is often diagnosed based on:  Symptoms.  Physical examination.  A CT scan or MRI of your head. TREATMENT Medicines may be given for pain and nausea. Medicines can also be given to help prevent recurrent migraines.  HOME CARE INSTRUCTIONS  Only take over-the-counter or prescription medicines for pain or discomfort as directed by your caregiver. The use of long-term narcotics is not recommended.  Lie down in a dark, quiet room when you have a migraine.  Keep a journal  to find out what may trigger your migraine headaches. For example, write down:  What you eat and drink.  How much sleep you get.  Any change to your diet or medicines.  Limit alcohol consumption.  Quit smoking if you smoke.  Get 7 to 9 hours of sleep, or as recommended by your caregiver.  Limit stress.  Keep lights dim if bright lights bother you and make your migraines worse. SEEK IMMEDIATE MEDICAL CARE IF:   Your migraine becomes severe.  You have a fever.  You have a stiff neck.  You have vision loss.  You have muscular weakness or loss of muscle control.  You start losing your balance or have trouble walking.  You feel faint or pass out.  You have severe symptoms that are different from your first symptoms. MAKE SURE YOU:   Understand these instructions.  Will watch your condition.  Will get help right away if you are not doing well or get worse. Document Released: 01/18/2005 Document Revised: 04/12/2011 Document Reviewed: 01/08/2011 [employer] Patient Information 2013 [employer].  

## 2012-02-22 NOTE — Progress Notes (Signed)
S: Pt returns today for follow up on migraines. She was last seen about one year ago. Since then she has had bladder tuck, total hysterectomy, and repair of rectocele in Oct 2013 by Dr Marice Potter. She has done very well and noticed while she was recovering she had very few migraines. She thinks this was because " everyone was leaving her alone".  In the last few week she has had more migraines. She has bee taking her mother to her doctors appointments. She is not prepared to go onto prevention at this time. Her migraines can last from 4 hours to 24 hours and seem to be related to weather changes at this time.  O: Alert, oriented, coordinated, NAD Skin warm and dry  A: migraine   P: Will refill Imitrex, Anaprox and add phenergan She will start migraine diary and if her daily headaches are actually increasing we will consider botox or another type of prevention. She is aware she can receive trigger point injection at any time. She will RTC in 2 months or PRN

## 2012-03-18 ENCOUNTER — Other Ambulatory Visit: Payer: Self-pay

## 2012-04-18 ENCOUNTER — Encounter: Payer: PRIVATE HEALTH INSURANCE | Admitting: Nurse Practitioner

## 2012-04-25 ENCOUNTER — Encounter: Payer: Self-pay | Admitting: Cardiovascular Disease

## 2012-04-25 ENCOUNTER — Ambulatory Visit (INDEPENDENT_AMBULATORY_CARE_PROVIDER_SITE_OTHER): Payer: PRIVATE HEALTH INSURANCE | Admitting: Cardiovascular Disease

## 2012-04-25 VITALS — BP 122/70 | HR 59 | Ht 66.0 in | Wt 211.5 lb

## 2012-04-25 DIAGNOSIS — I4892 Unspecified atrial flutter: Secondary | ICD-10-CM

## 2012-04-25 DIAGNOSIS — E669 Obesity, unspecified: Secondary | ICD-10-CM

## 2012-04-25 DIAGNOSIS — R002 Palpitations: Secondary | ICD-10-CM

## 2012-04-25 NOTE — Assessment & Plan Note (Signed)
No recent arrhythmia. She does have significant fatigue. She will try to cut the metoprolol dose in half in the morning. If she has continued fatigue, she could take one half pill twice a day.

## 2012-04-25 NOTE — Patient Instructions (Addendum)
You are doing well. Please cut the metoprolol to 1/2 pill in the AM If you continue to have fatigue, cut the evening does to 1/2 pill as well.  Please call us if you have new issues that need to be addressed before your next appt.  Your physician wants you to follow-up in: 12 months.  You will receive a reminder letter in the mail two months in advance. If you don't receive a letter, please call our office to schedule the follow-up appointment.

## 2012-04-25 NOTE — Progress Notes (Signed)
Patient ID: Sonya Lopez, female    DOB: 12/21/1955, 57 y.o.   MRN: [id number]  HPI Comments: Ms. Hibner is a very pleasant 57 year old woman with a history of diabetes, GERD, mild obesity with recent hospital admission in 04/16/2011 for atrial flutter with RVR. She presents for routine followup.   Overall she has been doing very well on metoprolol. She does have some fatigue. No further episodes of atrial fibrillation or atrial flutter after holding her amiodarone last year. Prior episodes of atrial arrhythmia in the setting of sinus infection.  Previous  Echocardiogram is essentially normal with normal systolic function, ejection fraction greater than 55%, no significant valvular disease for wall motion abnormality.  EKG today shows sinus rhythm with rate 59 beats per minute with no significant ST or T wave changes  Chest x-ray in the hospital was essentially normal Previous Total cholesterol 164, HDL 43, LDL 104, TSH 1.89   Outpatient Encounter Prescriptions as of 04/25/2012  Medication Sig Dispense Refill  . amiodarone (PACERONE) 200 MG tablet Take 400 mg by mouth as needed. Takes with an extra metoprolol when heart goes out of rhythm      . aspirin EC 81 MG tablet Take 81 mg by mouth daily.      . Azelaic Acid (FINACEA) 15 % cream Apply 1 application topically 2 (two) times daily as needed. For rosacea      . Calcium Citrate-Vitamin D 500-400 MG-UNIT CHEW Chew 1 tablet by mouth daily.      Marland Kitchen desloratadine (CLARINEX) 5 MG tablet Take 5 mg by mouth daily.        Marland Kitchen ibuprofen (ADVIL,MOTRIN) 800 MG tablet Take 1 tablet (800 mg total) by mouth every 8 (eight) hours as needed (mild pain).  60 tablet  1  . meloxicam (MOBIC) 15 MG tablet Take 15 mg by mouth at bedtime.       . metoprolol tartrate (LOPRESSOR) 25 MG tablet Take 1 tablet (25 mg total) by mouth 2 (two) times daily.  60 tablet  6  . Multiple Vitamin (MULTIVITAMIN WITH MINERALS) TABS Take 1 tablet by mouth daily.      .  naproxen sodium (ANAPROX) 550 MG tablet Take 550 mg by mouth as needed.      . naratriptan (AMERGE) 2.5 MG tablet Take 2.5 mg by mouth as needed. For migraine. Alternates with sumatriptan.      . omeprazole (PRILOSEC) 20 MG capsule Take 20 mg by mouth daily.      . Probiotic Product (PROBIOTIC PO) Take 1 capsule by mouth daily.       . promethazine (PHENERGAN) 25 MG tablet Take 1 tablet (25 mg total) by mouth every 6 (six) hours as needed for nausea.  30 tablet  1  . SUMAtriptan (IMITREX) 100 MG tablet Take 1 tablet (100 mg total) by mouth once as needed for migraine.  9 tablet  11     Review of Systems  Constitutional: Negative.   HENT: Negative.   Eyes: Negative.   Respiratory: Negative.   Cardiovascular: Negative.   Gastrointestinal: Negative.   Musculoskeletal: Negative.   Skin: Negative.   Neurological: Negative.   Psychiatric/Behavioral: Negative.   All other systems reviewed and are negative.    BP 122/70  Pulse 59  Ht 5\' 6"  (1.676 m)  Wt 211 lb 8 oz (95.936 kg)  BMI 34.15 kg/m2  Physical Exam  Nursing note and vitals reviewed. Constitutional: She is oriented to person, place, and time. She appears well-developed  and well-nourished.  HENT:  Head: Normocephalic.  Nose: Nose normal.  Mouth/Throat: Oropharynx is clear and moist.  Eyes: Conjunctivae are normal. Pupils are equal, round, and reactive to light.  Neck: Normal range of motion. Neck supple. No JVD present.  Cardiovascular: Normal rate, regular rhythm, S1 normal, S2 normal, normal heart sounds and intact distal pulses.  Exam reveals no gallop and no friction rub.   No murmur heard. Pulmonary/Chest: Effort normal and breath sounds normal. No respiratory distress. She has no wheezes. She has no rales. She exhibits no tenderness.  Abdominal: Soft. Bowel sounds are normal. She exhibits no distension. There is no tenderness.  Musculoskeletal: Normal range of motion. She exhibits no edema and no tenderness.   Lymphadenopathy:    She has no cervical adenopathy.  Neurological: She is alert and oriented to person, place, and time. Coordination normal.  Skin: Skin is warm and dry. No rash noted. No erythema.  Psychiatric: She has a normal mood and affect. Her behavior is normal. Judgment and thought content normal.    Assessment and Plan

## 2012-04-25 NOTE — Assessment & Plan Note (Signed)
We have encouraged continued exercise, careful diet management in an effort to lose weight. 

## 2012-05-09 ENCOUNTER — Ambulatory Visit (INDEPENDENT_AMBULATORY_CARE_PROVIDER_SITE_OTHER): Payer: PRIVATE HEALTH INSURANCE | Admitting: Nurse Practitioner

## 2012-05-09 ENCOUNTER — Encounter: Payer: Self-pay | Admitting: Nurse Practitioner

## 2012-05-09 VITALS — BP 108/64 | HR 65 | Ht 66.0 in | Wt 211.0 lb

## 2012-05-09 DIAGNOSIS — G43909 Migraine, unspecified, not intractable, without status migrainosus: Secondary | ICD-10-CM

## 2012-05-09 NOTE — Progress Notes (Signed)
Here today for headache follow-up, not new concerns.

## 2012-05-09 NOTE — Patient Instructions (Signed)
Migraine Headache A migraine headache is an intense, throbbing pain on one or both sides of your head. A migraine can last for 30 minutes to several hours. CAUSES  The exact cause of a migraine headache is not always known. However, a migraine may be caused when nerves in the brain become irritated and release chemicals that cause inflammation. This causes pain. SYMPTOMS  Pain on one or both sides of your head.  Pulsating or throbbing pain.  Severe pain that prevents daily activities.  Pain that is aggravated by any physical activity.  Nausea, vomiting, or both.  Dizziness.  Pain with exposure to bright lights, loud noises, or activity.  General sensitivity to bright lights, loud noises, or smells. Before you get a migraine, you may get warning signs that a migraine is coming (aura). An aura may include:  Seeing flashing lights.  Seeing bright spots, halos, or zig-zag lines.  Having tunnel vision or blurred vision.  Having feelings of numbness or tingling.  Having trouble talking.  Having muscle weakness. MIGRAINE TRIGGERS  Alcohol.  Smoking.  Stress.  Menstruation.  Aged cheeses.  Foods or drinks that contain nitrates, glutamate, aspartame, or tyramine.  Lack of sleep.  Chocolate.  Caffeine.  Hunger.  Physical exertion.  Fatigue.  Medicines used to treat chest pain (nitroglycerine), birth control pills, estrogen, and some blood pressure medicines. DIAGNOSIS  A migraine headache is often diagnosed based on:  Symptoms.  Physical examination.  A CT scan or MRI of your head. TREATMENT Medicines may be given for pain and nausea. Medicines can also be given to help prevent recurrent migraines.  HOME CARE INSTRUCTIONS  Only take over-the-counter or prescription medicines for pain or discomfort as directed by your caregiver. The use of long-term narcotics is not recommended.  Lie down in a dark, quiet room when you have a migraine.  Keep a journal  to find out what may trigger your migraine headaches. For example, write down:  What you eat and drink.  How much sleep you get.  Any change to your diet or medicines.  Limit alcohol consumption.  Quit smoking if you smoke.  Get 7 to 9 hours of sleep, or as recommended by your caregiver.  Limit stress.  Keep lights dim if bright lights bother you and make your migraines worse. SEEK IMMEDIATE MEDICAL CARE IF:   Your migraine becomes severe.  You have a fever.  You have a stiff neck.  You have vision loss.  You have muscular weakness or loss of muscle control.  You start losing your balance or have trouble walking.  You feel faint or pass out.  You have severe symptoms that are different from your first symptoms. MAKE SURE YOU:   Understand these instructions.  Will watch your condition.  Will get help right away if you are not doing well or get worse. Document Released: 01/18/2005 Document Revised: 04/12/2011 Document Reviewed: 01/08/2011 [employer] Patient Information 2013 [employer].  

## 2012-05-09 NOTE — Progress Notes (Signed)
S: Pt is here today to follow up on her migraine headaches. She has been keeping a migraine diary since late January. She does not have enough migraines to justify a daily prevention. She is able to treat with Imitrex, Naprosyn and occasional phenergan. We reviewed her cardiac risk factors as far as taking a tripan therapy is concerned. She does not have diabetes, HTN, or elevated cholesterol. Her cardiac history includes 2-3 times per year having a episode of atrial flutter. She is working on her weight and exercise program.   O: General alert, oriented, NAD Cardiac: RRR Lungs: clear Skin: warm and dry  A: Migraine  P: Pt is advised to pay closer attention to her triggers which include stress and weather events. She will premedicate as needed. RTC one year

## 2012-08-30 ENCOUNTER — Other Ambulatory Visit: Payer: Self-pay | Admitting: *Deleted

## 2012-08-30 MED ORDER — AMIODARONE HCL 200 MG PO TABS: 400.0000 mg | ORAL_TABLET | ORAL | Status: DC | PRN

## 2012-08-30 NOTE — Telephone Encounter (Signed)
Refilled Amiodarone sent to [facility].

## 2012-10-11 ENCOUNTER — Other Ambulatory Visit: Payer: Self-pay | Admitting: *Deleted

## 2012-10-11 MED ORDER — METOPROLOL TARTRATE 25 MG PO TABS: 25.0000 mg | ORAL_TABLET | Freq: Two times a day (BID) | ORAL | Status: DC

## 2012-10-11 NOTE — Telephone Encounter (Signed)
Refilled Metoprolol sent to [facility].

## 2012-12-07 ENCOUNTER — Other Ambulatory Visit: Payer: Self-pay

## 2012-12-19 ENCOUNTER — Encounter: Payer: PRIVATE HEALTH INSURANCE | Admitting: Nurse Practitioner

## 2012-12-26 ENCOUNTER — Encounter: Payer: Self-pay | Admitting: Nurse Practitioner

## 2012-12-26 ENCOUNTER — Ambulatory Visit (INDEPENDENT_AMBULATORY_CARE_PROVIDER_SITE_OTHER): Payer: PRIVATE HEALTH INSURANCE | Admitting: Nurse Practitioner

## 2012-12-26 VITALS — BP 112/72 | HR 82 | Ht 66.0 in | Wt 209.0 lb

## 2012-12-26 DIAGNOSIS — G43909 Migraine, unspecified, not intractable, without status migrainosus: Secondary | ICD-10-CM

## 2012-12-26 MED ORDER — SUMATRIPTAN SUCCINATE 100 MG PO TABS: 100.0000 mg | ORAL_TABLET | Freq: Once | ORAL | Status: DC | PRN

## 2012-12-26 MED ORDER — NARATRIPTAN HCL 2.5 MG PO TABS: 2.5000 mg | ORAL_TABLET | ORAL | Status: DC | PRN

## 2012-12-26 MED ORDER — NAPROXEN SODIUM 550 MG PO TABS: 550.0000 mg | ORAL_TABLET | ORAL | Status: DC | PRN

## 2012-12-26 MED ORDER — PROMETHAZINE HCL 25 MG PO TABS: 25.0000 mg | ORAL_TABLET | Freq: Four times a day (QID) | ORAL | Status: DC | PRN

## 2012-12-26 NOTE — Progress Notes (Signed)
History:  Sonya Bugay Richardsonis a 57 y.o. No obstetric history on file. who presents to [facility] clinic for follow up on migraines. She is doing very well and basically needs her yearly refills. She still recognizes weather as a trigger. She has about 2 severe per month. She is using an exercise bike for weight control.   The following portions of the patient's history were reviewed and updated as appropriate: allergies, current medications, past family history, past medical history, past social history, past surgical history and problem list.  Review of Systems:  Pertinent items are noted in HPI.  Objective:  Physical Exam BP 112/72  Pulse 82  Ht 5\' 6"  (1.676 m)  Wt 209 lb (94.802 kg)  BMI 33.75 kg/m2 GENERAL: Well-developed, well-nourished female in no acute distress. Obese HEENT: Normocephalic, atraumatic.  NECK: Supple. Normal thyroid.  LUNGS: Normal rate. Clear to auscultation bilaterally.  HEART: Regular rate and rhythm with no adventitious sounds.  EXTREMITIES: No cyanosis, clubbing, or edema, 2+ distal pulses.   Labs and Imaging No results found.  Assessment & Plan:  Assessment:   Migraine  Plans:  Refill: Anaprox, Amerge, Phenergan, Imitrex. She will not take Amerge and Imitrex in the same day. RTC one year or prn  Carolynn Serve, NP 12/26/2012 2:58 PM

## 2013-01-18 LAB — LIPID PANEL
Cholesterol: 196 mg/dL (ref 0–200)
HDL: 59 mg/dL (ref 35–70)
LDL CALC: 121 mg/dL
Triglycerides: 80 mg/dL (ref 40–160)

## 2013-02-27 ENCOUNTER — Ambulatory Visit: Payer: Self-pay | Admitting: Family Medicine

## 2013-02-27 LAB — HM MAMMOGRAPHY: HM MAMMO: NORMAL

## 2013-03-12 ENCOUNTER — Other Ambulatory Visit: Payer: Self-pay | Admitting: Cardiovascular Disease

## 2013-03-19 ENCOUNTER — Other Ambulatory Visit: Payer: Self-pay | Admitting: Nurse Practitioner

## 2013-03-23 ENCOUNTER — Other Ambulatory Visit: Payer: Self-pay | Admitting: *Deleted

## 2013-03-23 DIAGNOSIS — G43909 Migraine, unspecified, not intractable, without status migrainosus: Secondary | ICD-10-CM

## 2013-03-23 MED ORDER — NARATRIPTAN HCL 2.5 MG PO TABS: 2.5000 mg | ORAL_TABLET | ORAL | Status: DC | PRN

## 2013-03-23 NOTE — Telephone Encounter (Signed)
I received a refill request from patients pharmacy for a refill on her Naratriptan.  I have sent in refills to patients pharmacy.

## 2013-04-25 ENCOUNTER — Encounter: Payer: Self-pay | Admitting: Cardiovascular Disease

## 2013-04-25 ENCOUNTER — Ambulatory Visit (INDEPENDENT_AMBULATORY_CARE_PROVIDER_SITE_OTHER): Payer: PRIVATE HEALTH INSURANCE | Admitting: Cardiovascular Disease

## 2013-04-25 VITALS — BP 122/80 | HR 74 | Ht 66.0 in | Wt 210.0 lb

## 2013-04-25 DIAGNOSIS — E785 Hyperlipidemia, unspecified: Secondary | ICD-10-CM

## 2013-04-25 DIAGNOSIS — E669 Obesity, unspecified: Secondary | ICD-10-CM

## 2013-04-25 DIAGNOSIS — R079 Chest pain, unspecified: Secondary | ICD-10-CM

## 2013-04-25 DIAGNOSIS — I4892 Unspecified atrial flutter: Secondary | ICD-10-CM

## 2013-04-25 MED ORDER — PROPRANOLOL HCL 10 MG PO TABS: 10.0000 mg | ORAL_TABLET | Freq: Three times a day (TID) | ORAL | Status: DC | PRN

## 2013-04-25 NOTE — Progress Notes (Signed)
Patient ID: Sonya Lopez, female    DOB: 11-24-1955, 58 y.o.   MRN: [id number]  HPI Comments: Sonya Lopez is a very pleasant 58 year old woman with a history of diabetes, GERD, mild obesity with recent hospital admission in 04/16/2011 for atrial flutter with RVR. She presents for routine followup.   She was initially started on metoprolol. Since then she has weaned herself off the metoprolol as he was having dizziness and malaise. She denies having any further episodes of atrial flutter or tachycardia without metoprolol. She is also no longer taking amiodarone. She takes care of her mother who has significant medical issues. Significant stress at home. She uses the recumbent bike on a daily basis. Weight is trending up.  She does report having some chest pain in February and early March which seemed to resolve by starting a proton pump inhibitor. No further chest discomfort with biking or other exercise.  Previous  Echocardiogram is essentially normal with normal systolic function, ejection fraction greater than 55%, no significant valvular disease for wall motion abnormality.  EKG today shows sinus rhythm with rate 74 beats per minute with no significant ST or T wave changes  Total cholesterol 196, LDL 121, HDL 59. Cholesterol is up from 160s  Outpatient Encounter Prescriptions as of 04/25/2013  Medication Sig  . amiodarone (PACERONE) 200 MG tablet TAKE TWO TABLETS DAILY AS NEEDED TAKE WITH EXTRA METOPROLOL WHEN HEART FLUTTERS  . aspirin EC 81 MG tablet Take 81 mg by mouth daily.  . Azelaic Acid (FINACEA) 15 % cream Apply 1 application topically 2 (two) times daily as needed. For rosacea  . Calcium Citrate-Vitamin D 500-400 MG-UNIT CHEW Chew 1 tablet by mouth daily.  Marland Kitchen desloratadine (CLARINEX) 5 MG tablet Take 5 mg by mouth daily.    Marland Kitchen FLUVIRIN PRESERVATIVE FREE SUSP injection   . gabapentin (NEURONTIN) 100 MG capsule Take 100 mg by mouth as needed.   Marland Kitchen ibuprofen (ADVIL,MOTRIN)  800 MG tablet Take 1 tablet (800 mg total) by mouth every 8 (eight) hours as needed (mild pain).  Marland Kitchen imipramine (TOFRANIL) 25 MG tablet   . meloxicam (MOBIC) 15 MG tablet Take 15 mg by mouth at bedtime.   . metoprolol tartrate (LOPRESSOR) 25 MG tablet Take 25 mg by mouth as needed.  . naproxen sodium (ANAPROX) 550 MG tablet Take 1 tablet (550 mg total) by mouth as needed.  . naratriptan (AMERGE) 2.5 MG tablet Take 1 tablet (2.5 mg total) by mouth as needed. For migraine. Alternates with sumatriptan.  Marland Kitchen NEXIUM 40 MG capsule   . omeprazole (PRILOSEC) 20 MG capsule Take 20 mg by mouth daily.  . Probiotic Product (PROBIOTIC PO) Take 1 capsule by mouth daily.   . promethazine (PHENERGAN) 25 MG tablet Take 1 tablet (25 mg total) by mouth every 6 (six) hours as needed for nausea.  . SUMAtriptan (IMITREX) 100 MG tablet Take 1 tablet (100 mg total) by mouth once as needed for migraine.    Review of Systems  Constitutional: Negative.   HENT: Negative.   Eyes: Negative.   Respiratory: Negative.   Cardiovascular: Negative.   Gastrointestinal: Negative.   Endocrine: Negative.   Musculoskeletal: Negative.   Skin: Negative.   Allergic/Immunologic: Negative.   Neurological: Negative.   Hematological: Negative.   Psychiatric/Behavioral: Negative.   All other systems reviewed and are negative.   BP 122/80  Pulse 74  Ht 5\' 6"  (1.676 m)  Wt 210 lb (95.255 kg)  BMI 33.91 kg/m2  Physical Exam  Nursing note and vitals reviewed. Constitutional: She is oriented to person, place, and time. She appears well-developed and well-nourished.  HENT:  Head: Normocephalic.  Nose: Nose normal.  Mouth/Throat: Oropharynx is clear and moist.  Eyes: Conjunctivae are normal. Pupils are equal, round, and reactive to light.  Neck: Normal range of motion. Neck supple. No JVD present.  Cardiovascular: Normal rate, regular rhythm, S1 normal, S2 normal, normal heart sounds and intact distal pulses.  Exam reveals no  gallop and no friction rub.   No murmur heard. Pulmonary/Chest: Effort normal and breath sounds normal. No respiratory distress. She has no wheezes. She has no rales. She exhibits no tenderness.  Abdominal: Soft. Bowel sounds are normal. She exhibits no distension. There is no tenderness.  Musculoskeletal: Normal range of motion. She exhibits no edema and no tenderness.  Lymphadenopathy:    She has no cervical adenopathy.  Neurological: She is alert and oriented to person, place, and time. Coordination normal.  Skin: Skin is warm and dry. No rash noted. No erythema.  Psychiatric: She has a normal mood and affect. Her behavior is normal. Judgment and thought content normal.    Assessment and Plan

## 2013-04-25 NOTE — Assessment & Plan Note (Signed)
We have recommended exercise and weight loss in an effort to drop her cholesterol. Recent increased from 160 up to 190.

## 2013-04-25 NOTE — Patient Instructions (Signed)
You are doing well.  Start oatmeal, Consider RED YEAST RICE for cholesterol  Propranolol as needed for fast heart rate (lasts about 4 to 6 hours)  Please call us if you have new issues that need to be addressed before your next appt.  Your physician wants you to follow-up in: 6 months.  You will receive a reminder letter in the mail two months in advance. If you don't receive a letter, please call our office to schedule the follow-up appointment.

## 2013-04-25 NOTE — Assessment & Plan Note (Addendum)
No further episodes of arrhythmia. She will take metoprolol as needed. We have given her propranolol to take for any palpitations

## 2013-04-25 NOTE — Assessment & Plan Note (Signed)
We have encouraged continued exercise, careful diet management in an effort to lose weight. 

## 2013-08-23 DIAGNOSIS — N3946 Mixed incontinence: Secondary | ICD-10-CM | POA: Insufficient documentation

## 2013-10-22 ENCOUNTER — Encounter: Payer: Self-pay | Admitting: Cardiovascular Disease

## 2013-10-22 ENCOUNTER — Ambulatory Visit (INDEPENDENT_AMBULATORY_CARE_PROVIDER_SITE_OTHER): Payer: PRIVATE HEALTH INSURANCE | Admitting: Cardiovascular Disease

## 2013-10-22 VITALS — BP 124/84 | HR 61 | Ht 66.0 in | Wt 210.0 lb

## 2013-10-22 DIAGNOSIS — E785 Hyperlipidemia, unspecified: Secondary | ICD-10-CM

## 2013-10-22 DIAGNOSIS — E669 Obesity, unspecified: Secondary | ICD-10-CM

## 2013-10-22 DIAGNOSIS — I483 Typical atrial flutter: Secondary | ICD-10-CM

## 2013-10-22 DIAGNOSIS — I4892 Unspecified atrial flutter: Secondary | ICD-10-CM

## 2013-10-22 NOTE — Assessment & Plan Note (Signed)
No recent episodes of arrhythmia. She takes propranolol when necessary. Recommended she call us if arrhythmia or recurs on a regular basis

## 2013-10-22 NOTE — Progress Notes (Signed)
Patient ID: Sonya Lopez, female    DOB: 1955/06/25, 58 y.o.   MRN: [id number]  HPI Comments: Sonya Lopez is a very pleasant 58 year old woman with a history of diabetes, GERD, mild obesity with recent hospital admission in 04/16/2011 for atrial flutter with RVR. She presents for routine followup.   She was initially started on metoprolol. Since then she has weaned herself off the metoprolol as he was having dizziness and malaise. She is also no longer taking amiodarone. In followup today, she reports that she is doing well.  She takes care of her mother who has significant medical issues. Significant stress at home. She uses the recumbent bike on a daily basis. Weight is trending up. Very rare episodes of tachycardia, less than once per month, rate to 110 beats per minute commonly in the afternoon. Take propranolol when necessary  She does report having some chest pain in February and early March which seemed to resolve by starting a proton pump inhibitor. No further chest discomfort with biking or other exercise.  Previous  Echocardiogram is essentially normal with normal systolic function, ejection fraction greater than 55%, no significant valvular disease for wall motion abnormality.  EKG today shows sinus rhythm with rate 61 beats per minute with no significant ST or T wave changes, sinus arrhythmia  Total cholesterol 196, LDL 121, HDL 59. Cholesterol is up from 160s since then she has been taking great yeast rice  Outpatient Encounter Prescriptions as of 10/22/2013  Medication Sig  . amiodarone (PACERONE) 200 MG tablet TAKE TWO TABLETS DAILY AS NEEDED TAKE WITH EXTRA METOPROLOL WHEN HEART FLUTTERS  . aspirin EC 81 MG tablet Take 81 mg by mouth daily.  . Azelaic Acid (FINACEA) 15 % cream Apply 1 application topically 2 (two) times daily as needed. For rosacea  . FLUVIRIN PRESERVATIVE FREE SUSP injection   . gabapentin (NEURONTIN) 100 MG capsule Take 100 mg by mouth as needed.    . meloxicam (MOBIC) 15 MG tablet Take 15 mg by mouth at bedtime.   . metoprolol tartrate (LOPRESSOR) 25 MG tablet Take 25 mg by mouth as needed.  . naratriptan (AMERGE) 2.5 MG tablet Take 1 tablet (2.5 mg total) by mouth as needed. For migraine. Alternates with sumatriptan.  Marland Kitchen NEXIUM 40 MG capsule   . Probiotic Product (PROBIOTIC PO) Take 1 capsule by mouth daily.   . promethazine (PHENERGAN) 25 MG tablet Take 1 tablet (25 mg total) by mouth every 6 (six) hours as needed for nausea.  . propranolol (INDERAL) 10 MG tablet Take 1 tablet (10 mg total) by mouth 3 (three) times daily as needed (palpitations).  . SUMAtriptan (IMITREX) 100 MG tablet Take 1 tablet (100 mg total) by mouth once as needed for migraine.  . tamsulosin (FLOMAX) 0.4 MG CAPS capsule Take 0.4 mg by mouth.   Review of Systems  Constitutional: Negative.   HENT: Negative.   Eyes: Negative.   Respiratory: Negative.   Cardiovascular: Negative.   Gastrointestinal: Negative.   Endocrine: Negative.   Musculoskeletal: Negative.   Skin: Negative.   Allergic/Immunologic: Negative.   Neurological: Negative.   Hematological: Negative.   Psychiatric/Behavioral: Negative.   All other systems reviewed and are negative.  BP 124/84  Pulse 61  Ht 5\' 6"  (1.676 m)  Wt 210 lb (95.255 kg)  BMI 33.91 kg/m2  Physical Exam  Nursing note and vitals reviewed. Constitutional: She is oriented to person, place, and time. She appears well-developed and well-nourished.  HENT:  Head: Normocephalic.  Nose: Nose normal.  Mouth/Throat: Oropharynx is clear and moist.  Eyes: Conjunctivae are normal. Pupils are equal, round, and reactive to light.  Neck: Normal range of motion. Neck supple. No JVD present.  Cardiovascular: Normal rate, regular rhythm, S1 normal, S2 normal, normal heart sounds and intact distal pulses.  Exam reveals no gallop and no friction rub.   No murmur heard. Pulmonary/Chest: Effort normal and breath sounds normal. No  respiratory distress. She has no wheezes. She has no rales. She exhibits no tenderness.  Abdominal: Soft. Bowel sounds are normal. She exhibits no distension. There is no tenderness.  Musculoskeletal: Normal range of motion. She exhibits no edema and no tenderness.  Lymphadenopathy:    She has no cervical adenopathy.  Neurological: She is alert and oriented to person, place, and time. Coordination normal.  Skin: Skin is warm and dry. No rash noted. No erythema.  Psychiatric: She has a normal mood and affect. Her behavior is normal. Judgment and thought content normal.    Assessment and Plan

## 2013-10-22 NOTE — Assessment & Plan Note (Signed)
We have encouraged continued exercise, careful diet management in an effort to lose weight. 

## 2013-10-22 NOTE — Assessment & Plan Note (Signed)
She reports that she will have cholesterol rechecked through primary care later this year. She started on rate yeast rice

## 2013-10-22 NOTE — Patient Instructions (Signed)
You are doing well. No medication changes were made.  Please call us if you have new issues that need to be addressed before your next appt.  Your physician wants you to follow-up in: 12 months.  You will receive a reminder letter in the mail two months in advance. If you don't receive a letter, please call our office to schedule the follow-up appointment. 

## 2014-01-29 LAB — HEMOGLOBIN A1C: HEMOGLOBIN A1C: 5.4 % (ref 4.0–6.0)

## 2014-02-14 ENCOUNTER — Telehealth: Payer: Self-pay | Admitting: Cardiovascular Disease

## 2014-02-14 NOTE — Telephone Encounter (Signed)
Left message asking pt to call back and sched appt to see Dr. Rockey Situ to discuss the frequency of her episodes.

## 2014-02-14 NOTE — Telephone Encounter (Signed)
Pt came in stating she was a atrial flutter patient and that on 12/29/13 and 02/02/14 her heart rate went up to 140.  She took her meds but its taking time to work.  Had dizziness and pains all the way down her arm Did not go to ER just waited it out.  Please advise. It has not happened since then

## 2014-03-04 ENCOUNTER — Ambulatory Visit (INDEPENDENT_AMBULATORY_CARE_PROVIDER_SITE_OTHER): Payer: BLUE CROSS/BLUE SHIELD | Admitting: Cardiovascular Disease

## 2014-03-04 ENCOUNTER — Encounter: Payer: Self-pay | Admitting: Cardiovascular Disease

## 2014-03-04 VITALS — BP 112/78 | HR 62 | Ht 66.0 in | Wt 211.2 lb

## 2014-03-04 DIAGNOSIS — M797 Fibromyalgia: Secondary | ICD-10-CM

## 2014-03-04 DIAGNOSIS — R0789 Other chest pain: Secondary | ICD-10-CM

## 2014-03-04 DIAGNOSIS — E669 Obesity, unspecified: Secondary | ICD-10-CM

## 2014-03-04 DIAGNOSIS — I4892 Unspecified atrial flutter: Secondary | ICD-10-CM

## 2014-03-04 NOTE — Progress Notes (Signed)
Patient ID: Sonya Lopez, female    DOB: May 14, 1955, 59 y.o.   MRN: [id number]  HPI Comments: Sonya Lopez is a very pleasant 59 year old woman with a history of diabetes, GERD, mild obesity with recent hospital admission in 04/16/2011 for atrial flutter with RVR. She presents for routine followup of her arrhythmia.   In follow-up today, she reports having an episode of tachycardia at the end of November 2015, for which she took amiodarone 2, metoprolol with improvement back to normal sinus rhythm. Heart rate 156 and irregular. Additional episodes in her second 2016 at 3 PM lasting for 1.5 hours, heart rate 145 bpm, took amiodarone 2 with metoprolol, heart rate improved down to 124 bpm. Beat fell very hard, more than before. Eventually heart rate went back to 80s, felt normal, chest and arm pain stopped. Since then she has felt fine with no symptoms of chest pain or shortness of breath. Only has chest pain and arm pain during periods of arrhythmia. She does not want to take a medication every day.  She takes care of her mother who has significant medical issues. Significant stress at home. She uses the recumbent bike on a daily basis. Weight is trending up.  EKG today shows sinus rhythm with rate 62 beats per minute with no significant ST or T wave changes, sinus arrhythmia  Other past medical history She does report having some chest pain in February and early March 2015 which seemed to resolve by starting a proton pump inhibitor. No further chest discomfort with biking or other exercise.  Previous  Echocardiogram is essentially normal with normal systolic function, ejection fraction greater than 55%, no significant valvular disease for wall motion abnormality. Total cholesterol 196, LDL 121, HDL 59. Cholesterol is up from 160s since then she has been taking great yeast rice  Allergies  Allergen Reactions  . Adhesive [Tape] Dermatitis  . Macrodantin [Nitrofurantoin] Nausea And  Vomiting  . Cephalexin Rash  . Clindamycin Hcl Rash  . Doxycycline Swelling and Rash  . Erythromycin Rash    Outpatient Encounter Prescriptions as of 03/04/2014  Medication Sig  . amiodarone (PACERONE) 200 MG tablet TAKE TWO TABLETS DAILY AS NEEDED TAKE WITH EXTRA METOPROLOL WHEN HEART FLUTTERS  . aspirin EC 81 MG tablet Take 81 mg by mouth daily.  . Azelaic Acid (FINACEA) 15 % cream Apply 1 application topically 2 (two) times daily as needed. For rosacea  . gabapentin (NEURONTIN) 100 MG capsule Take 100 mg by mouth as needed.   . magnesium oxide (MAG-OX) 400 MG tablet Take 400 mg by mouth daily.  . meloxicam (MOBIC) 15 MG tablet Take 15 mg by mouth at bedtime.   . metoprolol tartrate (LOPRESSOR) 25 MG tablet Take 25 mg by mouth as needed.  . naratriptan (AMERGE) 2.5 MG tablet Take 1 tablet (2.5 mg total) by mouth as needed. For migraine. Alternates with sumatriptan.  Marland Kitchen NEXIUM 40 MG capsule Take 40 mg by mouth daily at 12 noon.   . Probiotic Product (PROBIOTIC PO) Take 1 capsule by mouth daily.   . promethazine (PHENERGAN) 25 MG tablet Take 1 tablet (25 mg total) by mouth every 6 (six) hours as needed for nausea.  . propranolol (INDERAL) 10 MG tablet Take 1 tablet (10 mg total) by mouth 3 (three) times daily as needed (palpitations).  . SUMAtriptan (IMITREX) 100 MG tablet Take 1 tablet (100 mg total) by mouth once as needed for migraine.  . tamsulosin (FLOMAX) 0.4 MG CAPS capsule Take 0.8  mg by mouth daily after breakfast.   . [DISCONTINUED] FLUVIRIN PRESERVATIVE FREE SUSP injection     Past Medical History  Diagnosis Date  . Headache(784.0)   . Allergy   . Female cystocele 01/05/2011  . Obesity   . GERD (gastroesophageal reflux disease)   . Arrhythmia     A-fib/A-flutter  . Anxiety     situational anxiety  . Fibromyalgia   . PONV (postoperative nausea and vomiting)     h/o difficult intubation and anes had to use "boogee"  . Difficult intubation 2009    [place] had to use  'boogee"    Past Surgical History  Procedure Laterality Date  . Gallbladder surgery  2004  . Dilation and curettage of uterus  05/05/2007  . Tonsillectomy  1961  . Cholecystectomy    . Cesarean section      x 1  . Vaginal hysterectomy  11/30/2011    Procedure: HYSTERECTOMY VAGINAL;  Surgeon: Emily Filbert, MD;  Location: [facility];  Service: Gynecology;  Laterality: N/A;  . Anterior and posterior repair  11/30/2011    Procedure: ANTERIOR (CYSTOCELE) AND POSTERIOR REPAIR (RECTOCELE);  Surgeon: Reece Packer, MD;  Location: [facility];  Service: Urology;  Laterality: N/A;  with cysto  . Bladder suspension  11/30/2011    Procedure: [place] PROCEDURE;  Surgeon: Reece Packer, MD;  Location: [facility];  Service: Urology;  Laterality: N/A;  graft 10x6  . Cystoscopy  11/30/2011    Procedure: CYSTOSCOPY;  Surgeon: Reece Packer, MD;  Location: [facility];  Service: Urology;;  . Colonoscopy  2009  . Total abdominal hysterectomy    . Bladder repair      Social History  reports that she has never smoked. She does not have any smokeless tobacco history on file. She reports that she drinks alcohol. She reports that she does not use illicit drugs.  Family History family history includes Diabetes in her mother; Hearing loss in her mother; Heart disease in her father and paternal grandfather.       Review of Systems  Constitutional: Negative.   Respiratory: Negative.   Cardiovascular: Negative.   Gastrointestinal: Negative.   Musculoskeletal: Negative.   Skin: Negative.   Neurological: Negative.   Hematological: Negative.   Psychiatric/Behavioral: Negative.   All other systems reviewed and are negative.  BP 112/78 mmHg  Pulse 62  Ht 5\' 6"  (1.676 m)  Wt 211 lb 4 oz (95.822 kg)  BMI 34.11 kg/m2  Physical Exam  Constitutional: She is oriented to person, place, and time. She appears well-developed and well-nourished.  HENT:  Head: Normocephalic.  Nose: Nose  normal.  Mouth/Throat: Oropharynx is clear and moist.  Eyes: Conjunctivae are normal. Pupils are equal, round, and reactive to light.  Neck: Normal range of motion. Neck supple. No JVD present.  Cardiovascular: Normal rate, regular rhythm, S1 normal, S2 normal, normal heart sounds and intact distal pulses.  Exam reveals no gallop and no friction rub.   No murmur heard. Pulmonary/Chest: Effort normal and breath sounds normal. No respiratory distress. She has no wheezes. She has no rales. She exhibits no tenderness.  Abdominal: Soft. Bowel sounds are normal. She exhibits no distension. There is no tenderness.  Musculoskeletal: Normal range of motion. She exhibits no edema or tenderness.  Lymphadenopathy:    She has no cervical adenopathy.  Neurological: She is alert and oriented to person, place, and time. Coordination normal.  Skin: Skin is warm and dry. No  rash noted. No erythema.  Psychiatric: She has a normal mood and affect. Her behavior is normal. Judgment and thought content normal.    Assessment and Plan  Nursing note and vitals reviewed.

## 2014-03-04 NOTE — Assessment & Plan Note (Signed)
Recent episode of fibromyalgia, possibly triggering arrhythmia.

## 2014-03-04 NOTE — Patient Instructions (Signed)
You are doing well. No medication changes were made.  Please call the office if you have more episodes of tachycardia  Please call us if you have new issues that need to be addressed before your next appt.  Your physician wants you to follow-up in: 6 months.  You will receive a reminder letter in the mail two months in advance. If you don't receive a letter, please call our office to schedule the follow-up appointment.

## 2014-03-04 NOTE — Assessment & Plan Note (Signed)
2 recent episodes November 2015, January 2016 lasting less than 2 hours. Resolved with amiodarone and metoprolol. We discussed the very his treatment options with her. She does not want medications on a daily basis at this time. We did discuss possibly starting flecainide daily. She will continue to track her arrhythmia and call our office for further events

## 2014-03-04 NOTE — Assessment & Plan Note (Signed)
We have encouraged continued exercise, careful diet management in an effort to lose weight. 

## 2014-05-26 NOTE — H&P (Signed)
PATIENT NAME:  Sonya Lopez, Sonya Lopez MR#:  [id number] DATE OF BIRTH:  Nov 19, 1955  DATE OF ADMISSION:  04/16/2011  PRIMARY CARE PHYSICIAN: Dr. Ancil Boozer REFERRING PHYSICIAN: ER physician Dr. Thomasene Lot  DERMATOLOGIST: Dr. Tyler Deis  UROLOGIST: Dr. Jacqlyn Larsen NEUROLOGIST: Dr. Hubert Azure for migraines  OB/GYN: Dr. Hulan Fray   CHIEF COMPLAINT: Chest tightness, lightheadedness, dizziness.   HISTORY OF PRESENT ILLNESS: Patient is a 59 year old female with past medical history of gastroesophageal reflux disease, fibromyalgia, migraines, Rosacea, urinary incontinence but no cardiac history who around 11:15 a.m. today developed sudden onset of dizziness, palpitations, and lightheadedness. Subsequently she also felt chest pressure, therefore, she called EMS. Therefore she decided to come to the ER. Patient in the ED was found to be in atrial fibrillation/atrial flutter with rapid ventricular response. As per EKG her highest rate documented as 219. She has never had similar problems before. She denies any thyroid problems. She reports that her PCP, Dr. Ancil Boozer, regularly checks her TSH and it has been normal in the past. She is having a lot of stresses at home because her mother is sick. She denies any excessive caffeine intake. She reports some diarrhea today. She says she had loose watery bowel movement four times. Denies any sick contacts. She recently completed antibiotics for a sinus infection which was Augmentin about two days ago.   ALLERGIES: Macrodantin, Flagyl, Keflex, erythromycin, doxycycline and Cleocin.   PAST MEDICAL HISTORY: 1. Gastroesophageal reflux disease. 2. Fibromyalgia.  3. Migraine.  4. Rosacea. 5. Urinary incontinence.   MEDICATIONS:  1. Nexium 40 mg daily.  2. Mobic 50 mg daily.  3. Clarinex 5 mg daily.  4. Nasal spray. 5. Finacea gel 50% as needed. 6. Evoclin foam 1% as needed.  7. Imipramine 25 mg daily. 8. Naratriptan 2.5 mg as needed.  9. Sumatriptan 100 mg as needed.   10. Multivitamin daily.  11. Align probiotic. 12. Calcium citrate. 13. Estrace 0.1 mg/gram vaginal cream every other night.   PAST SURGICAL HISTORY:  1. Dilatation and curettage with resection of uterine fibroids and polyps. 2. Cholecystectomy.  3. Tonsillectomy.  4. C-section.   FAMILY HISTORY: Mother has hypertension, diabetes. Father had heart disease and was a heavy smoker.   SOCIAL HISTORY: Denies any smoking, alcohol or drug abuse. Patient is married and lives with her husband.   REVIEW OF SYSTEMS: CONSTITUTIONAL: Reports fatigue and weakness. Denies any fever, weight loss. EYES: Denies any blurred or double vision. ENT: Denies any tinnitus, ear pain. RESPIRATORY: Denies any cough, wheezing. CARDIOVASCULAR: Reports chest pressure and palpitations. Denies any syncope. GASTROINTESTINAL: Denies any nausea, vomiting. Reports diarrhea. Denies any abdominal pain. GENITOURINARY: Denies any dysuria, hematuria. ENDOCRINE: Denies any polyuria, nocturia. HEME/LYMPH: Denies any anemia, easy bruisability. INTEGUMENT: Denies any acne, rash. Has rosacea. MUSCULOSKELETAL: Denies any swelling, gout. NEUROLOGICAL: Denies any numbness, weakness. PSYCH: Denies any history of anxiety or depression.   PHYSICAL EXAMINATION:  VITAL SIGNS: Temperature 98, heart rate currently on the monitor is around 150, respiratory rate 20, blood pressure 106/65, pulse ox 98% on room air.   GENERAL: Patient is a 59 year old Caucasian female who is mildly anxious, lying comfortably in bed. Her husband is at bedside.   EYES: No pallor, icterus or cyanosis. Pupils equal, round, and reactive to light and accommodation. Extraocular movements intact.   ENT: Wet mucous membranes. No oropharyngeal erythema or thrush.   NECK: Supple. No masses. No JVD. No thyromegaly or lymphadenopathy.   CHEST WALL: No tenderness to palpation. Not using accessory muscles of respiration. No intercostal  muscle retractions.   NECK: Supple. No  masses. No JVD. No thyromegaly. No lymphadenopathy.   LUNGS: Bilaterally clear to auscultation. No wheezing, rales, rhonchi.   CARDIOVASCULAR: S1, S2 irregularly irregular, tachycardic. No murmur, rubs, or gallops could not be appreciated because of the tachycardia.   ABDOMEN: Soft, nontender, nondistended. No guarding. No rigidity. No organomegaly. Normal bowel sounds.   SKIN: No rashes or lesions.   PERIPHERIES: No pedal edema. 2+ pedal pulses.   MUSCULOSKELETAL: No cyanosis, clubbing.   NEUROLOGICAL: Awake, alert, oriented x3. Nonfocal neurological exam. Cranial nerves grossly intact.   PSYCH: Normal mood and affect.   LABORATORY, DIAGNOSTIC, AND RADIOLOGICAL DATA: Cardiac enzymes negative. White count 11.7, hemoglobin 14.3, hematocrit 42.7, platelets 316, glucose 127, potassium 3.3, alkaline phosphatase 150, rest of complete metabolic panel is normal.   ASSESSMENT AND PLAN: 59 year old female with past medical history of gastroesophageal reflux disease, fibromyalgia, migraines presents with sudden onset of dizziness, palpitations, chest pressure.  1. New-onset atrial flutter/atrial fibrillation. Patient's heart rate was as high as 219 on EKG. She is currently on a Cardizem drip, will continue that. Will admit her to the hospital in a [facility] bed. Her blood pressure is borderline low; therefore, will also load her with IV digoxin and start oral digoxin from tomorrow. Will start on Lopressor with holding parameters. Will check serial cardiac enzymes, echo, TSH and get a cardiology consultation. Will also place her on therapeutic dose Lovenox for anticoagulation in addition to baby aspirin.  2. Diarrhea. Patient reports taking Augmentin for recent sinus infection. She stopped two days ago. Subsequently she has developed diarrhea. Will obtain C. difficile and stool cultures. 3. Mild leukocytosis, possibly due to acute stress. Will monitor closely.  4. Hyperglycemia, possibly due to  acute stress. Will check a hemoglobin A1c.   5. Hypokalemia possibly due to new-onset diarrhea today. Will check a magnesium level and supplement orally and IV in view of her acute cardiac arrhythmia.  6. Nonspecific elevation of alkaline phosphatase. Will monitor closely.  7. Gastroesophageal reflux disease. Will continue proton pump inhibitor.  8. History of urinary incontinence. Will continue imipramine.   Reviewed all medical records, discussed with the patient and her husband the plan of care and management.   CRITICAL CARE TIME SPENT: 45 minutes.  ____________________________ Cherre Huger, MD sp:cms D: 04/16/2011 15:56:22 ET T: 04/16/2011 16:13:36 ET JOB#: [id number]  cc: Cherre Huger, MD, <Dictator> Bethena Roys. Ancil Boozer, MD Cherre Huger MD ELECTRONICALLY SIGNED 04/16/2011 17:34

## 2014-05-26 NOTE — Discharge Summary (Signed)
PATIENT NAME:  Sonya Lopez, ALWIN MR#:  [id number] DATE OF BIRTH:  1955/07/11  DATE OF ADMISSION:  04/16/2011 DATE OF DISCHARGE:  04/17/2011  CHIEF COMPLAINT: Chest tightness, lightheadedness, dizziness,   CONSULTANTS:  Dr. Neoma Laming from Cardiology.   DISCHARGE DIAGNOSES:  1. Atrial fibrillation/flutter with rapid ventricular response, now back to sinus. 2. Diabetes, new diagnosis.   SECONDARY DIAGNOSES:  1. History of gastroesophageal reflux disease. 2. History of fibromyalgia. 3. History of migraines. 4. Rosacea. 5. History of urinary incontinence.   DISCHARGE MEDICATIONS:  1. Nexium 40 mg daily.  2. Mobic 15 mg daily.  3. Clarinex 5 mg daily.  4. Finacea 15% topical gel b.i.d.   5. Evoclin 1% topical foam topically once a day.  6. Naratriptan 2.5 mg once a day. 7. Sumatriptan 100 mg once a day. 8. Aspirin 81 mg daily.  9. Amiodarone 400 mg daily for 7 days, then 200 mg daily. 10. Metoprolol 25 mg p.o. b.i.d.   DIET: Low sodium, ADA diabetic diet.   ACTIVITY: As tolerated.    FOLLOWUP:  1. Please follow up with Dr. Humphrey Rolls on Thursday at 10:00 a.m.  2. Please follow up with your PCP early next week for diabetes work-up and plan of treatment.   HISTORY AND PHYSICAL: Please see the full History and Physical dictated by Dr. Karsten Fells on 04/16/2011, but briefly this is a 59 year old female who presented for the above chief complaint and was found with atrial fibrillation/flutter with a rate as high as 219. She was admitted to the Critical Care Unit. The patient received some digoxin as well as amiodarone drip. She was started on Lovenox therapeutic level as well.   HOSPITAL COURSE: The patient converted to sinus rhythm, and she was seen by Cardiology, Dr. Neoma Laming. The amiodarone was transitioned into p.o., and she is to follow up with Dr. Humphrey Rolls on Thursday. An echocardiogram was also done, although the final result is not back. Verbally, Dr. Humphrey Rolls told me that it looked as  if her ejection fraction was grossly normal.   The patient also had some diarrhea in the setting of antibiotic use, however, her Clostridium difficile was negative and the diarrhea has resolved. She is to continue with the above medications.   She had a hemoglobin A1c, which was slightly elevated at 6.7. Initiation of oral diabetes medications as well as weight control, weight loss, diabetic diet was discussed with the patient. The patient deferred starting any medications for diabetes and wanted to follow up with her outpatient physician, which is appropriate. I told her that she needs prompt followup within one week.   At this point, given that she has no further chest pains, the troponins have been negative, and she is back in sinus, and per Cardiology she is dischargeable, we will go ahead and discharge the patient.   CODE STATUS:  FULL CODE.     TOTAL TIME SPENT: 35 minutes.  ____________________________ Vivien Presto, MD sa:cbb D: 04/17/2011 18:01:32 ET T: 04/19/2011 12:06:23 ET JOB#: [id number] cc: Vivien Presto, MD, <Dictator> Vivien Presto MD ELECTRONICALLY SIGNED 04/23/2011 18:41

## 2014-05-26 NOTE — Consult Note (Signed)
PATIENT NAME:  Sonya Lopez, Sonya Lopez MR#:  [id number] DATE OF BIRTH:  1955-09-05  DATE OF CONSULTATION:  04/17/2011  REFERRING PHYSICIAN:   CONSULTING PHYSICIAN:  Dionisio David, MD  HISTORY OF PRESENT ILLNESS: This is a 59 year old pleasant white female with a past medical history of fibromyalgia, migraines, GI reflux, and urinary incontinence who came into the hospital because of shortness of breath, palpitations, dizziness, and lightheadedness. She was found to be in atrial fibrillation with rapid ventricular response rate. She was given IV digoxin and IV Cardizem, but she did not convert. Thus she was switched to IV amiodarone and she converted now to sinus rhythm overnight. She feels much better. She denies any chest pain, shortness of breath, PND, orthopnea, or leg swelling.   PAST MEDICAL HISTORY: As mentioned above.   MEDICATIONS: Nexium, Mobic, calcium citrate.   FAMILY HISTORY: Father had atrial fibrillation and defibrillator at age 26.   SOCIAL HISTORY: She denies EtOH abuse or smoking.   PHYSICAL EXAMINATION:  VITAL SIGNS: Blood pressure right now is stable. Heart rate is 78 and respiration 14. The monitor shows sinus rhythm with heart rate in the 70s.   NECK: No JVD.   LUNGS: Clear.   HEART: Regular rate and rhythm. Normal S1 and S2. No audible murmur.   ABDOMEN: Soft and nontender, positive bowel sounds.   EXTREMITIES: No pedal edema.   NEUROLOGIC: She appears to be intact.   LABS/STUDIES: EKG shows atrial fibrillation, rate 158, poor R wave progression.  Preliminary echocardiogram shows normal chamber size, normal left ventricular systolic function. No valvular abnormality seen.   ASSESSMENT AND PLAN: I advise sending the patient home on amiodarone 400 mg p.o. daily and aspirin 325 mg p.o. daily. She had an episode of atrial fibrillation. Echocardiogram shows normal left ventricular systolic function. The plan is to followup the patient next Thursday at 10 o'clock .    Thank you much for the referral. ____________________________ Dionisio David, MD sak:slb D: 04/17/2011 10:04:56 ET T: 04/17/2011 10:23:30 ET JOB#: [id number]  cc: Dionisio David, MD, <Dictator> Dionisio David MD ELECTRONICALLY SIGNED 04/27/2011 8:23

## 2014-05-26 NOTE — Consult Note (Signed)
consult dictated, 78 YOWF came with afib, RVR, converted with IV amiodrone. Feels much better, may go home on po amiodrone 400 qd for 1 week and then 200 qd along with asp 325 qd. f/u thursday 10 am.  Electronic Signatures: Angelica Ran (MD)  (Signed on 16-Mar-13 10:07)  Authored  Last Updated: 16-Mar-13 10:07 by Angelica Ran (MD)

## 2014-06-13 ENCOUNTER — Other Ambulatory Visit: Payer: Self-pay | Admitting: Family Medicine

## 2014-06-13 DIAGNOSIS — Z1231 Encounter for screening mammogram for malignant neoplasm of breast: Secondary | ICD-10-CM

## 2014-06-27 ENCOUNTER — Ambulatory Visit
Admission: RE | Admit: 2014-06-27 | Discharge: 2014-06-27 | Disposition: A | Discharge status: Home / Self Care | Payer: BLUE CROSS/BLUE SHIELD | Source: Ambulatory Visit | Attending: Family Medicine | Admitting: Family Medicine

## 2014-06-27 DIAGNOSIS — Z1231 Encounter for screening mammogram for malignant neoplasm of breast: Secondary | ICD-10-CM | POA: Diagnosis not present

## 2014-06-27 DIAGNOSIS — R922 Inconclusive mammogram: Secondary | ICD-10-CM | POA: Insufficient documentation

## 2014-06-28 ENCOUNTER — Other Ambulatory Visit: Payer: Self-pay | Admitting: Family Medicine

## 2014-06-28 DIAGNOSIS — R928 Other abnormal and inconclusive findings on diagnostic imaging of breast: Secondary | ICD-10-CM

## 2014-07-04 ENCOUNTER — Ambulatory Visit
Admission: RE | Admit: 2014-07-04 | Discharge: 2014-07-04 | Disposition: A | Discharge status: Home / Self Care | Payer: BLUE CROSS/BLUE SHIELD | Source: Ambulatory Visit | Attending: Family Medicine | Admitting: Family Medicine

## 2014-07-04 DIAGNOSIS — N63 Unspecified lump in breast: Secondary | ICD-10-CM | POA: Diagnosis not present

## 2014-07-04 DIAGNOSIS — R928 Other abnormal and inconclusive findings on diagnostic imaging of breast: Secondary | ICD-10-CM | POA: Diagnosis present

## 2014-07-29 ENCOUNTER — Encounter: Payer: Self-pay | Admitting: Family Medicine

## 2014-07-29 DIAGNOSIS — N3010 Interstitial cystitis (chronic) without hematuria: Secondary | ICD-10-CM | POA: Insufficient documentation

## 2014-07-29 DIAGNOSIS — J309 Allergic rhinitis, unspecified: Secondary | ICD-10-CM | POA: Insufficient documentation

## 2014-07-29 DIAGNOSIS — H9319 Tinnitus, unspecified ear: Secondary | ICD-10-CM | POA: Insufficient documentation

## 2014-07-29 DIAGNOSIS — M5417 Radiculopathy, lumbosacral region: Secondary | ICD-10-CM | POA: Insufficient documentation

## 2014-07-29 DIAGNOSIS — N200 Calculus of kidney: Secondary | ICD-10-CM | POA: Insufficient documentation

## 2014-07-29 DIAGNOSIS — K219 Gastro-esophageal reflux disease without esophagitis: Secondary | ICD-10-CM | POA: Insufficient documentation

## 2014-07-29 DIAGNOSIS — L719 Rosacea, unspecified: Secondary | ICD-10-CM | POA: Insufficient documentation

## 2014-07-29 DIAGNOSIS — F320 Major depressive disorder, single episode, mild: Secondary | ICD-10-CM | POA: Insufficient documentation

## 2014-07-29 DIAGNOSIS — N6019 Diffuse cystic mastopathy of unspecified breast: Secondary | ICD-10-CM | POA: Insufficient documentation

## 2014-08-01 ENCOUNTER — Ambulatory Visit (INDEPENDENT_AMBULATORY_CARE_PROVIDER_SITE_OTHER): Payer: BLUE CROSS/BLUE SHIELD | Admitting: Family Medicine

## 2014-08-01 ENCOUNTER — Encounter: Payer: Self-pay | Admitting: Family Medicine

## 2014-08-01 VITALS — BP 122/70 | HR 77 | Temp 98.6°F | Resp 16 | Ht 66.0 in | Wt 208.2 lb

## 2014-08-01 DIAGNOSIS — Z79899 Other long term (current) drug therapy: Secondary | ICD-10-CM | POA: Diagnosis not present

## 2014-08-01 DIAGNOSIS — G43009 Migraine without aura, not intractable, without status migrainosus: Secondary | ICD-10-CM

## 2014-08-01 DIAGNOSIS — M797 Fibromyalgia: Secondary | ICD-10-CM | POA: Diagnosis not present

## 2014-08-01 DIAGNOSIS — E785 Hyperlipidemia, unspecified: Secondary | ICD-10-CM | POA: Diagnosis not present

## 2014-08-01 DIAGNOSIS — E119 Type 2 diabetes mellitus without complications: Secondary | ICD-10-CM | POA: Diagnosis not present

## 2014-08-01 DIAGNOSIS — R350 Frequency of micturition: Secondary | ICD-10-CM | POA: Insufficient documentation

## 2014-08-01 LAB — POCT URINALYSIS DIPSTICK
BILIRUBIN UA: NEGATIVE
Glucose, UA: NEGATIVE
KETONES UA: NEGATIVE
Leukocytes, UA: NEGATIVE
Nitrite, UA: NEGATIVE
Protein, UA: NEGATIVE
RBC UA: NEGATIVE
Spec Grav, UA: 1.02
Urobilinogen, UA: 0.2
pH, UA: 6

## 2014-08-01 LAB — POCT UA - MICROALBUMIN: MICROALBUMIN (UR) POC: NEGATIVE mg/L

## 2014-08-01 LAB — POCT GLYCOSYLATED HEMOGLOBIN (HGB A1C): Hemoglobin A1C: 5.9

## 2014-08-01 MED ORDER — NARATRIPTAN HCL 2.5 MG PO TABS: 2.5000 mg | ORAL_TABLET | ORAL | Status: DC | PRN

## 2014-08-01 MED ORDER — CIPROFLOXACIN HCL 250 MG PO TABS: 250.0000 mg | ORAL_TABLET | Freq: Two times a day (BID) | ORAL | Status: DC

## 2014-08-01 MED ORDER — METAXALONE 800 MG PO TABS: 800.0000 mg | ORAL_TABLET | Freq: Every evening | ORAL | Status: DC | PRN

## 2014-08-01 NOTE — Progress Notes (Signed)
Name: Sonya Lopez   MRN: [id number]    DOB: 1955-02-23   Date:08/01/2014       Progress Note  Subjective  Chief Complaint  Chief Complaint  Patient presents with  . Fibromyalgia  . Gastrophageal Reflux  . Urinary Frequency  . Diabetes    Patient states that she is not currently on medication and does not check blood sugars at home.     HPI  DMII: she is on diet only and hgbA1C is under control, urine micro was negative. She denies polyphagia. She has noticed some polydipsia lately.  Eye exam is up to date  FMS: stopped Mobic because of possible renal side effects. She is noticing more muscle aches. She is on low dose gabapentin, she was afraid of tachyphylaxis, but advised her to go up to 300mg  daily   Urinary Frequency: it has been going on for the past week, has some burning on lumbar spine also , no radiculitis. She denies dysuria or hematuria  GERD: she is concerned about possible side effects of Nexium and is weaning self off.   Patient Active Problem List   Diagnosis Date Noted  . Increased urinary frequency 08/01/2014  . Fibrocystic breast disease 07/29/2014  . Clinical depression 07/29/2014  . Gastric reflux 07/29/2014  . Chronic interstitial cystitis 07/29/2014  . Calculus of kidney 07/29/2014  . Allergic rhinitis 07/29/2014  . Acne erythematosa 07/29/2014  . Lumbosacral radiculitis 07/29/2014  . Tinnitus 07/29/2014  . Mixed incontinence, urge and stress (female) (female) 08/23/2013  . Hyperlipidemia 04/25/2013  . H/O: hysterectomy 11/30/2011  . Atrial flutter 05/03/2011  . Diet-controlled type 2 diabetes mellitus 04/16/2011  . Bladder cystocele 01/05/2011  . Migraine without aura and without status migrainosus, not intractable 12/29/2010  . Fibromyalgia 12/29/2010  . Obesity 12/29/2010  . Adiposity 12/29/2010    Past Surgical History  Procedure Laterality Date  . Gallbladder surgery  2004  . Dilation and curettage of uterus  05/05/2007  . Tonsillectomy   1961  . Cesarean section      x 1  . Vaginal hysterectomy  11/30/2011    Procedure: HYSTERECTOMY VAGINAL;  Surgeon: Emily Filbert, MD;  Location: [facility];  Service: Gynecology;  Laterality: N/A;  . Anterior and posterior repair  11/30/2011    Procedure: ANTERIOR (CYSTOCELE) AND POSTERIOR REPAIR (RECTOCELE);  Surgeon: Reece Packer, MD;  Location: [facility];  Service: Urology;  Laterality: N/A;  with cysto  . Bladder suspension  11/30/2011    Procedure: [place] PROCEDURE;  Surgeon: Reece Packer, MD;  Location: [facility];  Service: Urology;  Laterality: N/A;  graft 10x6  . Cystoscopy  11/30/2011    Procedure: CYSTOSCOPY;  Surgeon: Reece Packer, MD;  Location: [facility];  Service: Urology;;  . Colonoscopy  2009  . Breast biopsy Right 2007    negative    Family History  Problem Relation Age of Onset  . Diabetes Mother   . Hearing loss Mother   . Hypertension Mother   . Heart disease Father   . Heart disease Paternal Grandfather   . Asthma Daughter   . Asthma Daughter   . Migraines Daughter   . Fibromyalgia Daughter   . Interstitial cystitis Daughter   . Asthma Daughter   . Colitis Daughter     History   Social History  . Marital Status: Married    Spouse Name: N/A  . Number of Children: N/A  . Years of Education: N/A   Occupational History  .  Not on file.   Social History Main Topics  . Smoking status: Never Smoker   . Smokeless tobacco: Never Used  . Alcohol Use: 0.0 oz/week    0 Standard drinks or equivalent per week     Comment: rare  . Drug Use: No  . Sexual Activity:    Partners: Male   Other Topics Concern  . Not on file   Social History Narrative     Current outpatient prescriptions:  .  amiodarone (PACERONE) 200 MG tablet, TAKE TWO TABLETS DAILY AS NEEDED TAKE WITH EXTRA METOPROLOL WHEN HEART FLUTTERS, Disp: 30 tablet, Rfl: 3 .  aspirin EC 81 MG tablet, Take 81 mg by mouth daily., Disp: , Rfl:  .  Azelaic Acid (FINACEA) 15 % cream, Apply 1  application topically 2 (two) times daily as needed. For rosacea, Disp: , Rfl:  .  Calcium Carbonate-Vitamin D (CALTRATE 600+D) 600-400 MG-UNIT per tablet, Take 1 tablet by mouth 2 (two) times daily., Disp: , Rfl:  .  clonazePAM (KLONOPIN) 0.5 MG tablet, Take 1 tablet by mouth as needed., Disp: , Rfl:  .  gabapentin (NEURONTIN) 100 MG capsule, Take 100 mg by mouth as needed. , Disp: , Rfl:  .  magnesium oxide (MAG-OX) 400 MG tablet, Take 400 mg by mouth daily., Disp: , Rfl:  .  mometasone (NASONEX) 50 MCG/ACT nasal spray, Place 2 sprays into the nose at bedtime., Disp: , Rfl:  .  naratriptan (AMERGE) 2.5 MG tablet, Take 1 tablet (2.5 mg total) by mouth as needed., Disp: 9 tablet, Rfl: 11 .  NEXIUM 40 MG capsule, Take 40 mg by mouth daily at 12 noon. , Disp: , Rfl:  .  Probiotic Product (PROBIOTIC PO), Take 1 capsule by mouth daily. , Disp: , Rfl:  .  promethazine (PHENERGAN) 25 MG tablet, Take 1 tablet (25 mg total) by mouth every 6 (six) hours as needed for nausea., Disp: 30 tablet, Rfl: 1 .  propranolol (INDERAL) 10 MG tablet, Take 1 tablet (10 mg total) by mouth 3 (three) times daily as needed (palpitations)., Disp: 90 tablet, Rfl: 4 .  tamsulosin (FLOMAX) 0.4 MG CAPS capsule, Take 0.8 mg by mouth 2 (two) times daily., Disp: , Rfl:  .  metoprolol tartrate (LOPRESSOR) 25 MG tablet, Take 25 mg by mouth as needed., Disp: , Rfl:   Allergies  Allergen Reactions  . Adhesive [Tape] Dermatitis  . Levaquin  [Levofloxacin]   . Macrodantin [Nitrofurantoin] Nausea And Vomiting  . Metronidazole   . Sulfamethoxazole-Trimethoprim   . Cephalexin Rash  . Clindamycin Hcl Rash  . Doxycycline Swelling and Rash  . Erythromycin Rash     ROS  Constitutional: Negative for fever or significant weight change.  Respiratory: Negative for cough and shortness of breath.   Cardiovascular: Negative for chest pain or palpitations lately, only takes medication prn .  Gastrointestinal: Negative for abdominal pain,  no bowel changes.  Musculoskeletal: Negative for gait problem or joint swelling.  Skin: Negative for rash.  Neurological: Negative for dizziness or headache.  No other specific complaints in a complete review of systems (except as listed in HPI above).   Objective  Filed Vitals:   08/01/14 1059  BP: 122/70  Pulse: 77  Temp: 98.6 F (37 C)  TempSrc: Oral  Resp: 16  Height: 5\' 6"  (1.676 m)  Weight: 208 lb 3.2 oz (94.439 kg)  SpO2: 98%    Body mass index is 33.62 kg/(m^2).  Physical Exam  Constitutional: Patient appears well-developed and  well-nourished. No distress.  Eyes:  No scleral icterus.  Neck: Normal range of motion. Neck supple. Cardiovascular: Normal rate, regular rhythm and normal heart sounds.  No murmur heard. No BLE edema. Pulmonary/Chest: Effort normal and breath sounds normal. No respiratory distress. Abdominal: Soft.  There is no tenderness. Psychiatric: Patient has a normal mood and affect. behavior is normal. Judgment and thought content normal. Muscular skeletal: pain during palpation of lumbar spine, neg straight leg raise, neg CVA tenderness  Recent Results (from the past 2160 hour(s))  POCT UA - Microalbumin     Status: None   Collection Time: 08/01/14 11:12 AM  Result Value Ref Range   Microalbumin Ur, POC negative mg/L   Creatinine, POC  mg/dL   Albumin/Creatinine Ratio, Urine, POC    POCT urinalysis dipstick     Status: None   Collection Time: 08/01/14 11:12 AM  Result Value Ref Range   Color, UA yellow    Clarity, UA clear    Glucose, UA negative    Bilirubin, UA negative    Ketones, UA negative    Spec Grav, UA 1.020    Blood, UA negative    pH, UA 6.0    Protein, UA negative    Urobilinogen, UA 0.2    Nitrite, UA negat    Leukocytes, UA Negative Negative  POCT glycosylated hemoglobin (Hb A1C)     Status: None   Collection Time: 08/01/14 11:18 AM  Result Value Ref Range   Hemoglobin A1C 5.9     Depression screen PHQ 2/9 08/01/2014   Decreased Interest 0  Down, Depressed, Hopeless 0  PHQ - 2 Score 0     Assessment & Plan  1. Type 2 diabetes mellitus without complication  - POCT glycosylated hemoglobin (Hb A1C) - POCT UA - Microalbumin - POCT urinalysis dipstick  2. Increased urinary frequency - POCT urinalysis dipstick  3. Hyperlipidemia  - Lipid Profile  4. Migraine without aura and without status migrainosus, not intractable  - naratriptan (AMERGE) 2.5 MG tablet; Take 1 tablet (2.5 mg total) by mouth as needed.  Dispense: 9 tablet; Refill: 11  5. Fibromyalgia Add muscle relaxer for back pain and also increase gabapentin to three times daily   6. Long-term use of high-risk medication  - Comprehensive Metabolic Panel (CMET) - CBC with Differential

## 2014-08-02 LAB — CBC WITH DIFFERENTIAL/PLATELET
Basophils Absolute: 0 10*3/uL (ref 0.0–0.2)
Basos: 0 %
EOS (ABSOLUTE): 0.1 10*3/uL (ref 0.0–0.4)
Eos: 1 %
HEMATOCRIT: 41.2 % (ref 34.0–46.6)
HEMOGLOBIN: 13.7 g/dL (ref 11.1–15.9)
Immature Grans (Abs): 0 10*3/uL (ref 0.0–0.1)
Immature Granulocytes: 0 %
Lymphocytes Absolute: 2.5 10*3/uL (ref 0.7–3.1)
Lymphs: 30 %
MCH: 29.8 pg (ref 26.6–33.0)
MCHC: 33.3 g/dL (ref 31.5–35.7)
MCV: 90 fL (ref 79–97)
Monocytes Absolute: 0.4 10*3/uL (ref 0.1–0.9)
Monocytes: 5 %
NEUTROS PCT: 64 %
Neutrophils Absolute: 5.3 10*3/uL (ref 1.4–7.0)
Platelets: 333 10*3/uL (ref 150–379)
RBC: 4.6 x10E6/uL (ref 3.77–5.28)
RDW: 14.7 % (ref 12.3–15.4)
WBC: 8.4 10*3/uL (ref 3.4–10.8)

## 2014-08-02 LAB — LIPID PANEL
Chol/HDL Ratio: 3.8 ratio units (ref 0.0–4.4)
Cholesterol, Total: 218 mg/dL — ABNORMAL HIGH (ref 100–199)
HDL: 58 mg/dL (ref >39)
LDL Calculated: 136 mg/dL — ABNORMAL HIGH (ref 0–99)
TRIGLYCERIDES: 118 mg/dL (ref 0–149)
VLDL Cholesterol Cal: 24 mg/dL (ref 5–40)

## 2014-08-02 LAB — COMPREHENSIVE METABOLIC PANEL
A/G RATIO: 1.6 (ref 1.1–2.5)
ALBUMIN: 4.3 g/dL (ref 3.6–4.8)
ALT: 16 IU/L (ref 0–32)
AST: 15 IU/L (ref 0–40)
Alkaline Phosphatase: 136 IU/L — ABNORMAL HIGH (ref 39–117)
BILIRUBIN TOTAL: 0.3 mg/dL (ref 0.0–1.2)
BUN/Creatinine Ratio: 22 (ref 11–26)
BUN: 17 mg/dL (ref 8–27)
CALCIUM: 9.6 mg/dL (ref 8.7–10.3)
CHLORIDE: 101 mmol/L (ref 97–108)
CO2: 27 mmol/L (ref 18–29)
Creatinine, Ser: 0.78 mg/dL (ref 0.57–1.00)
GFR calc Af Amer: 95 mL/min/{1.73_m2} (ref >59)
GFR calc non Af Amer: 82 mL/min/{1.73_m2} (ref >59)
Globulin, Total: 2.7 g/dL (ref 1.5–4.5)
Glucose: 94 mg/dL (ref 65–99)
POTASSIUM: 4.6 mmol/L (ref 3.5–5.2)
Sodium: 142 mmol/L (ref 134–144)
Total Protein: 7 g/dL (ref 6.0–8.5)

## 2014-08-02 LAB — URINE CULTURE

## 2014-08-06 ENCOUNTER — Telehealth: Payer: Self-pay

## 2014-08-06 NOTE — Telephone Encounter (Signed)
-----   Message from Steele Sizer, MD sent at 08/04/2014  4:36 PM EDT ----- Based on the results of lipid panel her cardiovascular risk factor in the next 10 years is 3.4% Sugar , kidney and liver functions are within normal limits, except for slightly elevated alkaline phosphatase but better than last visit CBC is within normal limits Urine culture neg, she did not have an UTI, how is her back going? Is she feeling better?

## 2014-08-06 NOTE — Telephone Encounter (Signed)
Patient notified of labs and states her bladder problems have improved with the muscle relaxer.

## 2014-08-13 ENCOUNTER — Ambulatory Visit (INDEPENDENT_AMBULATORY_CARE_PROVIDER_SITE_OTHER): Payer: BLUE CROSS/BLUE SHIELD | Admitting: Cardiovascular Disease

## 2014-08-13 ENCOUNTER — Encounter: Payer: Self-pay | Admitting: Cardiovascular Disease

## 2014-08-13 VITALS — BP 90/62 | HR 71 | Ht 66.0 in | Wt 206.8 lb

## 2014-08-13 DIAGNOSIS — I4901 Ventricular fibrillation: Secondary | ICD-10-CM | POA: Diagnosis not present

## 2014-08-13 DIAGNOSIS — E785 Hyperlipidemia, unspecified: Secondary | ICD-10-CM | POA: Diagnosis not present

## 2014-08-13 DIAGNOSIS — E119 Type 2 diabetes mellitus without complications: Secondary | ICD-10-CM

## 2014-08-13 DIAGNOSIS — I483 Typical atrial flutter: Secondary | ICD-10-CM | POA: Diagnosis not present

## 2014-08-13 DIAGNOSIS — I498 Other specified cardiac arrhythmias: Secondary | ICD-10-CM

## 2014-08-13 NOTE — Assessment & Plan Note (Signed)
We have encouraged continued exercise, careful diet management in an effort to lose weight. 

## 2014-08-13 NOTE — Progress Notes (Signed)
Patient ID: Sonya Lopez, female    DOB: December 30, 1955, 59 y.o.   MRN: [id number]  HPI Comments: Sonya Lopez is a very pleasant 59 year old woman with a history of diabetes, GERD, mild obesity with recent hospital admission in 04/16/2011 for atrial flutter with RVR. She presents for routine followup of her arrhythmia.    in follow-up today, she reports having an episode of atrial flutter in March as well as may 2016 lasting for less than 2 hours each episode She took dose of metoprolol and amiodarone with improvement of her symptoms Otherwise feeling well. Blood pressure running low today but has not been a problem at home  EKG on today's visit shows normal sinus rhythm with rate 71 bpm, no significant ST or T-wave changes  Other past medical history Previous episodes of atrial flutter in November 2015, January 2016 typically lasting less than 2 hours In the past she did not want to take a medication every day  She takes care of her mother who has significant medical issues. Significant stress at home. She does report having some chest pain in February and early March 2015 which seemed to resolve by starting a proton pump inhibitor. No further chest discomfort with biking or other exercise.  Previous  Echocardiogram is essentially normal with normal systolic function, ejection fraction greater than 55%, no significant valvular disease for wall motion abnormality. Total cholesterol 196, LDL 121, HDL 59. Cholesterol is up from 160s since then she has been taking great yeast rice  Allergies  Allergen Reactions  . Adhesive [Tape] Dermatitis  . Levaquin  [Levofloxacin]   . Macrodantin [Nitrofurantoin] Nausea And Vomiting  . Metronidazole   . Sulfamethoxazole-Trimethoprim   . Cephalexin Rash  . Clindamycin Hcl Rash  . Doxycycline Swelling and Rash  . Erythromycin Rash    Outpatient Encounter Prescriptions as of 08/13/2014  Medication Sig  . amiodarone (PACERONE) 200 MG tablet TAKE  TWO TABLETS DAILY AS NEEDED TAKE WITH EXTRA METOPROLOL WHEN HEART FLUTTERS  . aspirin EC 81 MG tablet Take 81 mg by mouth daily.  . Azelaic Acid (FINACEA) 15 % cream Apply 1 application topically 2 (two) times daily as needed. For rosacea  . Calcium Carbonate-Vitamin D (CALTRATE 600+D) 600-400 MG-UNIT per tablet Take 1 tablet by mouth 2 (two) times daily.  . clonazePAM (KLONOPIN) 0.5 MG tablet Take 1 tablet by mouth as needed.  . gabapentin (NEURONTIN) 100 MG capsule Take 100 mg by mouth 2 (two) times daily.   . magnesium oxide (MAG-OX) 400 MG tablet Take 400 mg by mouth daily.  . metaxalone (SKELAXIN) 800 MG tablet Take 1 tablet (800 mg total) by mouth at bedtime as needed for muscle spasms.  . metoprolol tartrate (LOPRESSOR) 25 MG tablet Take 25 mg by mouth as needed.  . mometasone (NASONEX) 50 MCG/ACT nasal spray Place 2 sprays into the nose at bedtime.  . naratriptan (AMERGE) 2.5 MG tablet Take 1 tablet (2.5 mg total) by mouth as needed.  Marland Kitchen NEXIUM 40 MG capsule Take 40 mg by mouth every 3 (three) days.   . Probiotic Product (PROBIOTIC PO) Take 1 capsule by mouth daily.   . propranolol (INDERAL) 10 MG tablet Take 1 tablet (10 mg total) by mouth 3 (three) times daily as needed (palpitations).  . ranitidine (ZANTAC) 150 MG capsule Take 150 mg by mouth as needed for heartburn.  . tamsulosin (FLOMAX) 0.4 MG CAPS capsule Take 0.8 mg by mouth 2 (two) times daily.  . [DISCONTINUED] ciprofloxacin (CIPRO) 250  MG tablet Take 1 tablet (250 mg total) by mouth 2 (two) times daily. (Patient not taking: Reported on 08/13/2014)  . [DISCONTINUED] promethazine (PHENERGAN) 25 MG tablet Take 1 tablet (25 mg total) by mouth every 6 (six) hours as needed for nausea. (Patient not taking: Reported on 08/13/2014)   No facility-administered encounter medications on file as of 08/13/2014.    Past Medical History  Diagnosis Date  . Headache(784.0)   . Allergy   . Female cystocele 01/05/2011  . Obesity   . GERD  (gastroesophageal reflux disease)   . Arrhythmia     A-fib/A-flutter  . Anxiety     situational anxiety  . Fibromyalgia   . PONV (postoperative nausea and vomiting)     h/o difficult intubation and anes had to use "boogee"  . Difficult intubation 2009    [place] had to use  'boogee"  . Depression     Past Surgical History  Procedure Laterality Date  . Gallbladder surgery  2004  . Dilation and curettage of uterus  05/05/2007  . Tonsillectomy  1961  . Cesarean section      x 1  . Vaginal hysterectomy  11/30/2011    Procedure: HYSTERECTOMY VAGINAL;  Surgeon: Emily Filbert, MD;  Location: [facility];  Service: Gynecology;  Laterality: N/A;  . Anterior and posterior repair  11/30/2011    Procedure: ANTERIOR (CYSTOCELE) AND POSTERIOR REPAIR (RECTOCELE);  Surgeon: Reece Packer, MD;  Location: [facility];  Service: Urology;  Laterality: N/A;  with cysto  . Bladder suspension  11/30/2011    Procedure: [place] PROCEDURE;  Surgeon: Reece Packer, MD;  Location: [facility];  Service: Urology;  Laterality: N/A;  graft 10x6  . Cystoscopy  11/30/2011    Procedure: CYSTOSCOPY;  Surgeon: Reece Packer, MD;  Location: [facility];  Service: Urology;;  . Colonoscopy  2009  . Breast biopsy Right 2007    negative    Social History  reports that she has never smoked. She has never used smokeless tobacco. She reports that she drinks alcohol. She reports that she does not use illicit drugs.  Family History family history includes Asthma in her daughter, daughter, and daughter; Colitis in her daughter; Diabetes in her mother; Fibromyalgia in her daughter; Hearing loss in her mother; Heart disease in her father and paternal grandfather; Hypertension in her mother; Interstitial cystitis in her daughter; Migraines in her daughter.       Review of Systems  Constitutional: Negative.   Respiratory: Negative.   Cardiovascular: Positive for palpitations.  Gastrointestinal: Negative.    Musculoskeletal: Negative.   Skin: Negative.   Neurological: Negative.   Hematological: Negative.   Psychiatric/Behavioral: Negative.   All other systems reviewed and are negative.  BP 90/62 mmHg  Pulse 71  Ht 5\' 6"  (1.676 m)  Wt 206 lb 12 oz (93.781 kg)  BMI 33.39 kg/m2 Repeat blood pressure with systolic 400 Physical Exam  Constitutional: She is oriented to person, place, and time. She appears well-developed and well-nourished.  HENT:  Head: Normocephalic.  Nose: Nose normal.  Mouth/Throat: Oropharynx is clear and moist.  Eyes: Conjunctivae are normal. Pupils are equal, round, and reactive to light.  Neck: Normal range of motion. Neck supple. No JVD present.  Cardiovascular: Normal rate, regular rhythm, S1 normal, S2 normal, normal heart sounds and intact distal pulses.  Exam reveals no gallop and no friction rub.   No murmur heard. Pulmonary/Chest: Effort normal and breath sounds normal. No respiratory distress.  She has no wheezes. She has no rales. She exhibits no tenderness.  Abdominal: Soft. Bowel sounds are normal. She exhibits no distension. There is no tenderness.  Musculoskeletal: Normal range of motion. She exhibits no edema or tenderness.  Lymphadenopathy:    She has no cervical adenopathy.  Neurological: She is alert and oriented to person, place, and time. Coordination normal.  Skin: Skin is warm and dry. No rash noted. No erythema.  Psychiatric: She has a normal mood and affect. Her behavior is normal. Judgment and thought content normal.    Assessment and Plan  Nursing note and vitals reviewed.

## 2014-08-13 NOTE — Assessment & Plan Note (Signed)
Rare episodes of atrial flutter lasting less than 2 hours, one episode every 2 months She prefers to continue on her current regiment given rare symptoms We did discuss ablation, also taking anti-rhythmic medications daily

## 2014-08-13 NOTE — Patient Instructions (Signed)
You are doing well. No medication changes were made.  Please call us if you have new issues that need to be addressed before your next appt.  Your physician wants you to follow-up in: 6 months.  You will receive a reminder letter in the mail two months in advance. If you don't receive a letter, please call our office to schedule the follow-up appointment.   

## 2014-08-13 NOTE — Assessment & Plan Note (Signed)
We did discuss her cholesterol with her. She's not particularly eager to start a cholesterol medication at this time Recommended she work on weight loss

## 2014-08-13 NOTE — Addendum Note (Signed)
Addended by: [name] on: 08/13/2014 05:37 PM   Modules accepted: Level of Service

## 2014-11-05 ENCOUNTER — Encounter: Payer: Self-pay | Admitting: Family Medicine

## 2014-12-04 ENCOUNTER — Other Ambulatory Visit: Payer: Self-pay

## 2014-12-04 MED ORDER — GABAPENTIN 100 MG PO CAPS: 100.0000 mg | ORAL_CAPSULE | Freq: Two times a day (BID) | ORAL | Status: DC

## 2014-12-04 MED ORDER — CLONAZEPAM 0.5 MG PO TABS: 0.5000 mg | ORAL_TABLET | ORAL | Status: DC | PRN

## 2014-12-04 NOTE — Telephone Encounter (Signed)
Patient requesting refill. 

## 2015-01-31 ENCOUNTER — Encounter: Payer: Self-pay | Admitting: Family Medicine

## 2015-01-31 ENCOUNTER — Ambulatory Visit (INDEPENDENT_AMBULATORY_CARE_PROVIDER_SITE_OTHER): Payer: BLUE CROSS/BLUE SHIELD | Admitting: Family Medicine

## 2015-01-31 VITALS — BP 98/75 | HR 77 | Temp 97.9°F | Resp 17 | Ht 66.0 in | Wt 210.8 lb

## 2015-01-31 DIAGNOSIS — E785 Hyperlipidemia, unspecified: Secondary | ICD-10-CM

## 2015-01-31 DIAGNOSIS — I4892 Unspecified atrial flutter: Secondary | ICD-10-CM | POA: Diagnosis not present

## 2015-01-31 DIAGNOSIS — M797 Fibromyalgia: Secondary | ICD-10-CM

## 2015-01-31 DIAGNOSIS — E119 Type 2 diabetes mellitus without complications: Secondary | ICD-10-CM

## 2015-01-31 DIAGNOSIS — F320 Major depressive disorder, single episode, mild: Secondary | ICD-10-CM

## 2015-01-31 DIAGNOSIS — M7062 Trochanteric bursitis, left hip: Secondary | ICD-10-CM | POA: Diagnosis not present

## 2015-01-31 LAB — POCT GLYCOSYLATED HEMOGLOBIN (HGB A1C): HEMOGLOBIN A1C: 5.6

## 2015-01-31 LAB — GLUCOSE, POCT (MANUAL RESULT ENTRY): POC Glucose: 92 mg/dl (ref 70–99)

## 2015-01-31 MED ORDER — TRIAMCINOLONE ACETONIDE 40 MG/ML IJ SUSP
40.0000 mg | Freq: Once | INTRAMUSCULAR | Status: AC
  Administered 2015-01-31: 40 mg via INTRAMUSCULAR

## 2015-01-31 MED ORDER — CLONAZEPAM 0.5 MG PO TABS: 0.5000 mg | ORAL_TABLET | ORAL | Status: DC | PRN

## 2015-01-31 MED ORDER — LIDOCAINE HCL (PF) 1 % IJ SOLN
2.0000 mL | Freq: Once | INTRAMUSCULAR | Status: AC
  Administered 2015-01-31: 2 mL via INTRADERMAL

## 2015-01-31 MED ORDER — DULOXETINE HCL 30 MG PO CPEP: 30.0000 mg | ORAL_CAPSULE | Freq: Every day | ORAL | Status: DC

## 2015-01-31 MED ORDER — METAXALONE 800 MG PO TABS: 800.0000 mg | ORAL_TABLET | Freq: Every evening | ORAL | Status: DC | PRN

## 2015-01-31 NOTE — Progress Notes (Signed)
Name: Sonya Lopez   MRN: [id number]    DOB: September 18, 1955   Date:01/31/2015       Progress Note  Subjective  Chief Complaint  Chief Complaint  Patient presents with  . Follow-up    6 mo  . Diabetes  . Hyperlipidemia    HPI  DMII: she is on diet only and hgbA1C is under control, urine micro was negative. She denies polyphagia, no polyuria or polydipsia.  Eye exam is up to date  FMS: stopped Mobic because of possible renal side effects. She is noticing more muscle aches, symptoms much worse this week, her hips are very sore now, left side worse than right side. She is on low dose gabapentin, she is taking a little higher dose of gabapentin one in am and 2 in pm, she can't go up on morning dose because it causes more mental fogginess. She is feeling sore all over today  Mild Major Depression: feeling overwhelmed, over the past 6 months she has been under a lot of stress. Initially problems at her church - new pastor - and in November she started to home-schooling her grand-daughter, very worried about her daughter' marriage. Her son in law is very depressed and threatening  to kill himself and lately even threatening to kill her daughter. Her youngest and middle daughter are in [place]. Husband job will be affect by new laws and she will have more work to do starting January 1st, 2017.  She is feeling tired, anhedonia, mental fogginess, having to take more alprazolam to be able to tone her down. She is also having crying spells.   Atrial Flutter: having more episodes, HR goes up to 150's - seeing Dr. Rockey Situ and she may need an ablation soon.   Trochanteric bursitis: she states left outer hip very sore over the past few weeks, worse when standing for prolonged period of time or laying down on left side, it wakes her up at night sometimes.   Patient Active Problem List   Diagnosis Date Noted  . Increased urinary frequency 08/01/2014  . Fibrocystic breast disease 07/29/2014  . Mild major  depression ([place]) 07/29/2014  . Gastric reflux 07/29/2014  . Chronic interstitial cystitis 07/29/2014  . Calculus of kidney 07/29/2014  . Allergic rhinitis 07/29/2014  . Acne erythematosa 07/29/2014  . Lumbosacral radiculitis 07/29/2014  . Tinnitus 07/29/2014  . Mixed incontinence, urge and stress (female) (female) 08/23/2013  . Hyperlipidemia 04/25/2013  . H/O: hysterectomy 11/30/2011  . Atrial flutter ([place]) 05/03/2011  . Diet-controlled type 2 diabetes mellitus ([place]) 04/16/2011  . Bladder cystocele 01/05/2011  . Migraine without aura and without status migrainosus, not intractable 12/29/2010  . Fibromyalgia 12/29/2010  . Obesity 12/29/2010    Past Surgical History  Procedure Laterality Date  . Gallbladder surgery  2004  . Dilation and curettage of uterus  05/05/2007  . Tonsillectomy  1961  . Cesarean section      x 1  . Vaginal hysterectomy  11/30/2011    Procedure: HYSTERECTOMY VAGINAL;  Surgeon: Emily Filbert, MD;  Location: [facility];  Service: Gynecology;  Laterality: N/A;  . Anterior and posterior repair  11/30/2011    Procedure: ANTERIOR (CYSTOCELE) AND POSTERIOR REPAIR (RECTOCELE);  Surgeon: Reece Packer, MD;  Location: [facility];  Service: Urology;  Laterality: N/A;  with cysto  . Bladder suspension  11/30/2011    Procedure: [place] PROCEDURE;  Surgeon: Reece Packer, MD;  Location: [facility];  Service: Urology;  Laterality: N/A;  graft 10x6  .  Cystoscopy  11/30/2011    Procedure: CYSTOSCOPY;  Surgeon: Reece Packer, MD;  Location: [facility];  Service: Urology;;  . Colonoscopy  2009  . Breast biopsy Right 2007    negative    Family History  Problem Relation Age of Onset  . Diabetes Mother   . Hearing loss Mother   . Hypertension Mother   . Heart disease Father   . Heart disease Paternal Grandfather   . Asthma Daughter   . Asthma Daughter   . Migraines Daughter   . Fibromyalgia Daughter   . Interstitial cystitis Daughter   . Asthma Daughter   . Colitis Daughter      Social History   Social History  . Marital Status: Married    Spouse Name: N/A  . Number of Children: N/A  . Years of Education: N/A   Occupational History  . Not on file.   Social History Main Topics  . Smoking status: Never Smoker   . Smokeless tobacco: Never Used  . Alcohol Use: 0.0 oz/week    0 Standard drinks or equivalent per week     Comment: rare  . Drug Use: No  . Sexual Activity:    Partners: Male   Other Topics Concern  . Not on file   Social History Narrative     Current outpatient prescriptions:  .  amiodarone (PACERONE) 200 MG tablet, TAKE TWO TABLETS DAILY AS NEEDED TAKE WITH EXTRA METOPROLOL WHEN HEART FLUTTERS, Disp: 30 tablet, Rfl: 3 .  aspirin EC 81 MG tablet, Take 81 mg by mouth daily., Disp: , Rfl:  .  Azelaic Acid (FINACEA) 15 % cream, Apply 1 application topically 2 (two) times daily as needed. For rosacea, Disp: , Rfl:  .  Calcium Carbonate-Vitamin D (CALTRATE 600+D) 600-400 MG-UNIT per tablet, Take 1 tablet by mouth 2 (two) times daily., Disp: , Rfl:  .  clonazePAM (KLONOPIN) 0.5 MG tablet, Take 1 tablet (0.5 mg total) by mouth as needed., Disp: 30 tablet, Rfl: 2 .  gabapentin (NEURONTIN) 100 MG capsule, Take 1 capsule (100 mg total) by mouth 2 (two) times daily., Disp: 60 capsule, Rfl: 5 .  magnesium oxide (MAG-OX) 400 MG tablet, Take 400 mg by mouth daily., Disp: , Rfl:  .  metaxalone (SKELAXIN) 800 MG tablet, Take 1 tablet (800 mg total) by mouth at bedtime as needed for muscle spasms., Disp: 30 tablet, Rfl: 0 .  metoprolol tartrate (LOPRESSOR) 25 MG tablet, Take 25 mg by mouth as needed., Disp: , Rfl:  .  mometasone (NASONEX) 50 MCG/ACT nasal spray, Place 2 sprays into the nose at bedtime., Disp: , Rfl:  .  naratriptan (AMERGE) 2.5 MG tablet, Take 1 tablet (2.5 mg total) by mouth as needed., Disp: 9 tablet, Rfl: 11 .  Probiotic Product (PROBIOTIC PO), Take 1 capsule by mouth daily. , Disp: , Rfl:  .  propranolol (INDERAL) 10 MG tablet, Take  1 tablet (10 mg total) by mouth 3 (three) times daily as needed (palpitations)., Disp: 90 tablet, Rfl: 4 .  ranitidine (ZANTAC) 150 MG capsule, Take 150 mg by mouth as needed for heartburn., Disp: , Rfl:  .  tamsulosin (FLOMAX) 0.4 MG CAPS capsule, Take 0.8 mg by mouth 2 (two) times daily., Disp: , Rfl:   Allergies  Allergen Reactions  . Adhesive [Tape] Dermatitis  . Levaquin  [Levofloxacin]   . Macrodantin [Nitrofurantoin] Nausea And Vomiting  . Metronidazole Nausea Only  . Nitrofurantoin Macrocrystal Nausea And Vomiting  . Sulfamethoxazole-Trimethoprim   .  Cephalexin Rash  . Cephalexin Rash  . Clindamycin Hcl Rash  . Clindamycin Hcl Rash  . Doxycycline Swelling and Rash  . Erythromycin Rash     ROS  Constitutional: Negative for fever or weight change.  Respiratory: Negative for cough and shortness of breath.   Cardiovascular: Negative for chest pain or palpitations.  Gastrointestinal: Negative for abdominal pain, no bowel changes.  Musculoskeletal: Negative for gait problem or joint swelling.  Skin: Negative for rash.  Neurological: Negative for dizziness or headache.  No other specific complaints in a complete review of systems (except as listed in HPI above).  Objective  Filed Vitals:   01/31/15 0808  BP: 98/75  Pulse: 77  Temp: 97.9 F (36.6 C)  TempSrc: Oral  Resp: 17  Height: 5\' 6"  (1.676 m)  Weight: 210 lb 12.8 oz (95.618 kg)  SpO2: 95%    Body mass index is 34.04 kg/(m^2).  Physical Exam  Constitutional: Patient appears well-developed and well-nourished. Obese No distress.  HEENT: head atraumatic, normocephalic, pupils equal and reactive to light, neck supple, throat within normal limits Cardiovascular: Normal rate, regular rhythm and normal heart sounds.  No murmur heard. No BLE edema. Pulmonary/Chest: Effort normal and breath sounds normal. No respiratory distress. Abdominal: Soft.  There is no tenderness. Psychiatric: Patient has a normal mood and  affect. behavior is normal. Judgment and thought content normal.   PHQ2/9: Depression screen [facility] 2/9 01/31/2015 08/01/2014  Decreased Interest 0 0  Down, Depressed, Hopeless 0 0  PHQ - 2 Score 0 0    Fall Risk: Fall Risk  01/31/2015 08/01/2014 08/01/2014  Falls in the past year? No No No    Functional Status Survey: Is the patient deaf or have difficulty hearing?: No Does the patient have difficulty seeing, even when wearing glasses/contacts?: Yes (reading glasses) Does the patient have difficulty concentrating, remembering, or making decisions?: No Does the patient have difficulty walking or climbing stairs?: No Does the patient have difficulty dressing or bathing?: No Does the patient have difficulty doing errands alone such as visiting a doctor's office or shopping?: No    Assessment & Plan  1. Diet-controlled type 2 diabetes mellitus ([place])  On diet only, doing well  2. Fibromyalgia  - metaxalone (SKELAXIN) 800 MG tablet; Take 1 tablet (800 mg total) by mouth at bedtime as needed for muscle spasms.  Dispense: 90 tablet; Refill: 5 - DULoxetine (CYMBALTA) 30 MG capsule; Take 1-2 capsules (30-60 mg total) by mouth daily. Increase to two daily after the first week  Dispense: 60 capsule; Refill: 0  3. Hyperlipidemia  On diet only, she has DM and would be good to be on statin therapy but she has FMS and multiple medication side effects and we will not start her on statin at this time  4. Atrial flutter, unspecified type ([place])  Continue follow up with Dr. Rockey Situ   5. Mild major depression ([facility])  - clonazePAM (KLONOPIN) 0.5 MG tablet; Take 1 tablet (0.5 mg total) by mouth as needed.  Dispense: 30 tablet; Refill: 2 - DULoxetine (CYMBALTA) 30 MG capsule; Take 1-2 capsules (30-60 mg total) by mouth daily. Increase to two daily after the first week  Dispense: 60 capsule; Refill: 0   6. Trochanteric bursitis of left hip  Patient signed consent form  Area prepped with betadine  and alcohol  Left trochanteric bursa injected with 1 % lidocaine no epi and 40mg  /ml   No complications  Patient tolerated procedure well  Dressing applied

## 2015-02-02 DIAGNOSIS — C801 Malignant (primary) neoplasm, unspecified: Secondary | ICD-10-CM

## 2015-02-02 DIAGNOSIS — Z923 Personal history of irradiation: Secondary | ICD-10-CM

## 2015-02-02 HISTORY — DX: Personal history of irradiation: Z92.3

## 2015-02-02 HISTORY — DX: Malignant (primary) neoplasm, unspecified: C80.1

## 2015-02-10 ENCOUNTER — Ambulatory Visit: Payer: BLUE CROSS/BLUE SHIELD | Admitting: Cardiovascular Disease

## 2015-02-24 ENCOUNTER — Encounter: Payer: Self-pay | Admitting: Cardiovascular Disease

## 2015-02-24 ENCOUNTER — Ambulatory Visit (INDEPENDENT_AMBULATORY_CARE_PROVIDER_SITE_OTHER): Payer: BLUE CROSS/BLUE SHIELD | Admitting: Cardiovascular Disease

## 2015-02-24 VITALS — BP 110/68 | HR 62 | Ht 66.0 in | Wt 207.0 lb

## 2015-02-24 DIAGNOSIS — I4892 Unspecified atrial flutter: Secondary | ICD-10-CM

## 2015-02-24 DIAGNOSIS — E119 Type 2 diabetes mellitus without complications: Secondary | ICD-10-CM | POA: Diagnosis not present

## 2015-02-24 DIAGNOSIS — M797 Fibromyalgia: Secondary | ICD-10-CM | POA: Diagnosis not present

## 2015-02-24 MED ORDER — AMIODARONE HCL 200 MG PO TABS: 200.0000 mg | ORAL_TABLET | Freq: Two times a day (BID) | ORAL | Status: DC | PRN

## 2015-02-24 NOTE — Assessment & Plan Note (Signed)
Frequent episodes of tachycardia We have recommended she take amiodarone 200 mg daily We did discuss possibly using alternate antiarrhythmics such as flecainide, propafenone Also discussed ablation. She reports that she does not want ablation at this time She is comfortable with low-dose amiodarone on a daily basis as she has been taking this periodically She will need annual amiodarone surveillance lab work

## 2015-02-24 NOTE — Assessment & Plan Note (Signed)
Recently started on Cymbalta with improvement of her symptoms

## 2015-02-24 NOTE — Assessment & Plan Note (Signed)
We have encouraged continued exercise, careful diet management in an effort to lose weight. 

## 2015-02-24 NOTE — Progress Notes (Signed)
Patient ID: Sonya Lopez, female    DOB: 1955-02-25, 60 y.o.   MRN: [id number]  HPI Comments: Sonya Lopez is a very pleasant 60 year old woman with a history of diabetes, GERD, mild obesity with recent hospital admission in 04/16/2011 for atrial flutter with RVR. She presents for routine followup of her arrhythmia.   She details numerous episodes of atrial flutter over the past 6 months Episode September 6, September 9, September 19, September 26, September 30, October 26, 12/08/2014, November 18, November 25, December 22 She denies any episodes over the past month Typically episodes will last 1.5 up to 2 hours She takes amiodarone, propranolol and symptoms will resolve Heart rate typically runs 110, sometimes 155 bpm, one episode 170 bpm Relatively symptomatic when she has episodes  last EKG documenting arrhythmias 2013 Currently does not feel ready for ablation  She does report significant stress in the family, particularly over the summer 2016 She was started on Cymbalta with improvement of her symptoms. Feels less stressed  EKG on today's visit shows sinus rhythm with rate 62 bpm, no significant ST or T-wave changes  Other past medical history Previous episodes of atrial flutter in November 2015, January 2016 typically lasting less than 2 hours In the past she did not want to take a medication every day  She takes care of her mother who has significant medical issues. Significant stress at home. She does report having some chest pain in February and early March 2015 which seemed to resolve by starting a proton pump inhibitor. No further chest discomfort with biking or other exercise.  Previous  Echocardiogram is essentially normal with normal systolic function, ejection fraction greater than 55%, no significant valvular disease for wall motion abnormality. Total cholesterol 196, LDL 121, HDL 59. Cholesterol is up from 160s since then she has been taking great yeast  rice  Allergies  Allergen Reactions  . Adhesive [Tape] Dermatitis  . Levaquin  [Levofloxacin]   . Macrodantin [Nitrofurantoin] Nausea And Vomiting  . Metronidazole Nausea Only  . Nitrofurantoin Macrocrystal Nausea And Vomiting  . Sulfamethoxazole-Trimethoprim   . Cephalexin Rash  . Clindamycin Hcl Rash  . Doxycycline Swelling and Rash  . Erythromycin Rash    Outpatient Encounter Prescriptions as of 02/24/2015  Medication Sig  . amiodarone (PACERONE) 200 MG tablet Take 1 tablet (200 mg total) by mouth 2 (two) times daily as needed.  Mar[place] Kitchen aspirin EC 81 MG tablet Take 81 mg by mouth daily.  . Azelaic Acid (FINACEA) 15 % cream Apply 1 application topically 2 (two) times daily as needed. For rosacea  . Calcium Carbonate-Vitamin D (CALTRATE 600+D) 600-400 MG-UNIT per tablet Take 1 tablet by mouth 2 (two) times daily.  . clonazePAM (KLONOPIN) 0.5 MG tablet Take 1 tablet (0.5 mg total) by mouth as needed.  . DULoxetine (CYMBALTA) 30 MG capsule Take 1-2 capsules (30-60 mg total) by mouth daily. Increase to two daily after the first week  . gabapentin (NEURONTIN) 100 MG capsule Take 1 capsule (100 mg total) by mouth 2 (two) times daily.  . magnesium oxide (MAG-OX) 400 MG tablet Take 400 mg by mouth daily.  . metaxalone (SKELAXIN) 800 MG tablet Take 1 tablet (800 mg total) by mouth at bedtime as needed for muscle spasms.  . metoprolol tartrate (LOPRESSOR) 25 MG tablet Take 25 mg by mouth as needed.  . mometasone (NASONEX) 50 MCG/ACT nasal spray Place 2 sprays into the nose at bedtime.  . naratriptan (AMERGE) 2.5 MG tablet Take 1 tablet (  2.5 mg total) by mouth as needed.  . Probiotic Product (PROBIOTIC PO) Take 1 capsule by mouth daily.   . propranolol (INDERAL) 10 MG tablet Take 1 tablet (10 mg total) by mouth 3 (three) times daily as needed (palpitations).  . ranitidine (ZANTAC) 150 MG capsule Take 150 mg by mouth as needed for heartburn.  . tamsulosin (FLOMAX) 0.4 MG CAPS capsule Take 0.8 mg by  mouth 2 (two) times daily.  . [DISCONTINUED] amiodarone (PACERONE) 200 MG tablet TAKE TWO TABLETS DAILY AS NEEDED TAKE WITH EXTRA METOPROLOL WHEN HEART FLUTTERS   No facility-administered encounter medications on file as of 02/24/2015.    Past Medical History  Diagnosis Date  . Headache(784.0)   . Allergy   . Female cystocele 01/05/2011  . Obesity   . GERD (gastroesophageal reflux disease)   . Arrhythmia     A-fib/A-flutter  . Anxiety     situational anxiety  . Fibromyalgia   . PONV (postoperative nausea and vomiting)     h/o difficult intubation and anes had to use "boogee"  . Difficult intubation 2009    [place] had to use  'boogee"  . Depression     Past Surgical History  Procedure Laterality Date  . Gallbladder surgery  2004  . Dilation and curettage of uterus  05/05/2007  . Tonsillectomy  1961  . Cesarean section      x 1  . Vaginal hysterectomy  11/30/2011    Procedure: HYSTERECTOMY VAGINAL;  Surgeon: Emily Filbert, MD;  Location: [facility];  Service: Gynecology;  Laterality: N/A;  . Anterior and posterior repair  11/30/2011    Procedure: ANTERIOR (CYSTOCELE) AND POSTERIOR REPAIR (RECTOCELE);  Surgeon: Reece Packer, MD;  Location: [facility];  Service: Urology;  Laterality: N/A;  with cysto  . Bladder suspension  11/30/2011    Procedure: [place] PROCEDURE;  Surgeon: Reece Packer, MD;  Location: [facility];  Service: Urology;  Laterality: N/A;  graft 10x6  . Cystoscopy  11/30/2011    Procedure: CYSTOSCOPY;  Surgeon: Reece Packer, MD;  Location: [facility];  Service: Urology;;  . Colonoscopy  2009  . Breast biopsy Right 2007    negative    Social History  reports that she has never smoked. She has never used smokeless tobacco. She reports that she drinks alcohol. She reports that she does not use illicit drugs.  Family History family history includes Asthma in her daughter, daughter, and daughter; Colitis in her daughter; Diabetes in her  mother; Fibromyalgia in her daughter; Hearing loss in her mother; Heart disease in her father and paternal grandfather; Hypertension in her mother; Interstitial cystitis in her daughter; Migraines in her daughter.       Review of Systems  Constitutional: Negative.   Respiratory: Negative.   Cardiovascular: Positive for palpitations.       Episodes of tachycardia  Gastrointestinal: Negative.   Musculoskeletal: Negative.   Neurological: Negative.   Hematological: Negative.   Psychiatric/Behavioral: Negative.   All other systems reviewed and are negative.  BP 110/68 mmHg  Pulse 62  Ht 5\' 6"  (1.676 m)  Wt 207 lb (93.895 kg)  BMI 33.43 kg/m2  Physical Exam  Constitutional: She is oriented to person, place, and time. She appears well-developed and well-nourished.  HENT:  Head: Normocephalic.  Nose: Nose normal.  Mouth/Throat: Oropharynx is clear and moist.  Eyes: Conjunctivae are normal. Pupils are equal, round, and reactive to light.  Neck: Normal range of motion. Neck supple.  No JVD present.  Cardiovascular: Normal rate, regular rhythm, S1 normal, S2 normal, normal heart sounds and intact distal pulses.  Exam reveals no gallop and no friction rub.   No murmur heard. Pulmonary/Chest: Effort normal and breath sounds normal. No respiratory distress. She has no wheezes. She has no rales. She exhibits no tenderness.  Abdominal: Soft. Bowel sounds are normal. She exhibits no distension. There is no tenderness.  Musculoskeletal: Normal range of motion. She exhibits no edema or tenderness.  Lymphadenopathy:    She has no cervical adenopathy.  Neurological: She is alert and oriented to person, place, and time. Coordination normal.  Skin: Skin is warm and dry. No rash noted. No erythema.  Psychiatric: She has a normal mood and affect. Her behavior is normal. Judgment and thought content normal.    Assessment and Plan  Nursing note and vitals reviewed.

## 2015-02-24 NOTE — Patient Instructions (Signed)
You are doing well.  Please start amiodarone one a day for atrial flutter If you have break through tachycardia, Take extra propranolol and amiodarone  Please call us if you have new issues that need to be addressed before your next appt.  Your physician wants you to follow-up in: 6 months.  You will receive a reminder letter in the mail two months in advance. If you don't receive a letter, please call our office to schedule the follow-up appointment.

## 2015-02-25 ENCOUNTER — Encounter: Payer: Self-pay | Admitting: Family Medicine

## 2015-02-25 DIAGNOSIS — Z79899 Other long term (current) drug therapy: Secondary | ICD-10-CM | POA: Insufficient documentation

## 2015-02-26 ENCOUNTER — Ambulatory Visit (INDEPENDENT_AMBULATORY_CARE_PROVIDER_SITE_OTHER): Payer: BLUE CROSS/BLUE SHIELD

## 2015-02-26 VITALS — HR 71 | Ht 66.0 in | Wt 209.5 lb

## 2015-02-26 DIAGNOSIS — I4892 Unspecified atrial flutter: Secondary | ICD-10-CM | POA: Diagnosis not present

## 2015-02-26 NOTE — Progress Notes (Signed)
1.) Reason for visit: EKG  2.) Name of MD requesting visit: Dr. Rockey Situ  3.) H&P: Pt advised at ov 02/24/15 to come over for EKG if she felt she was in atrial flutter.  ROS related to problem: pt was in [place] today w/ her daughter, while standing in line, she felt there her HR was irregular, daughter could see pt's shirt visibly "jumping".  Pt's mobile pulse-oximeter showed HR jumping between 70-110.  Pt is asymptomatic , EKG shows NSR w/ HR 71.      4.) Assessment and plan per MD: Pt states that her sx are not very frequent and she is agreeable to wearing 30 day monitor if Dr. Rockey Situ would like her to.  Advised pt that I will make Dr. Rockey Situ aware and call her back w/ any recommendations.

## 2015-03-01 NOTE — Progress Notes (Signed)
Amiodarone can take time to work, To speed up the process, she could take amiodarone 200 mg po BID for 2 weeks then down to one a day For severe symptoms 400 mg BID can be take for 5 days then down to 200 mg twice a day for 2 weeks

## 2015-03-03 NOTE — Progress Notes (Signed)
Spoke w/ pt.  Advised her of Dr. Donivan Scull recommendation.  She states that she does not feel that her meds need to be adjusted at time, that she was "just trying to catch the afib on an EKG so I can show the doctor that does the ablation".  She reports that she has remained asymptomatic and feels good. Asked her to come in for EKG if she feels that she is having afib again.

## 2015-03-03 NOTE — Telephone Encounter (Signed)
This encounter was created in error - please disregard.

## 2015-03-04 ENCOUNTER — Encounter: Payer: Self-pay | Admitting: Family Medicine

## 2015-03-04 ENCOUNTER — Ambulatory Visit (INDEPENDENT_AMBULATORY_CARE_PROVIDER_SITE_OTHER): Payer: BLUE CROSS/BLUE SHIELD | Admitting: Family Medicine

## 2015-03-04 VITALS — BP 112/62 | HR 88 | Temp 98.2°F | Resp 16 | Wt 205.4 lb

## 2015-03-04 DIAGNOSIS — B379 Candidiasis, unspecified: Secondary | ICD-10-CM

## 2015-03-04 DIAGNOSIS — M797 Fibromyalgia: Secondary | ICD-10-CM

## 2015-03-04 DIAGNOSIS — K1120 Sialoadenitis, unspecified: Secondary | ICD-10-CM | POA: Diagnosis not present

## 2015-03-04 DIAGNOSIS — F320 Major depressive disorder, single episode, mild: Secondary | ICD-10-CM

## 2015-03-04 DIAGNOSIS — T3695XA Adverse effect of unspecified systemic antibiotic, initial encounter: Secondary | ICD-10-CM

## 2015-03-04 MED ORDER — AMOXICILLIN-POT CLAVULANATE 875-125 MG PO TABS: 1.0000 | ORAL_TABLET | Freq: Two times a day (BID) | ORAL | Status: DC

## 2015-03-04 MED ORDER — FLUCONAZOLE 150 MG PO TABS: 150.0000 mg | ORAL_TABLET | ORAL | Status: DC

## 2015-03-04 MED ORDER — DULOXETINE HCL 30 MG PO CPEP: 30.0000 mg | ORAL_CAPSULE | Freq: Every day | ORAL | Status: DC

## 2015-03-04 NOTE — Progress Notes (Signed)
Name: Sonya Lopez   MRN: [id number]    DOB: Jan 24, 1956   Date:03/04/2015       Progress Note  Subjective  Chief Complaint  Chief Complaint  Patient presents with  . Diabetes    patient is here for her 36-month f/u. patient stated things are going well.    HPI  FMS: stopped Mobic because of possible renal side effects. She is noticing more muscle aches, symptoms much worse this week, her hips are very sore now, left side worse than right side. She is on low dose gabapentin, she is taking a little higher dose of gabapentin one in am and 2 in pm, she can't go up on morning dose because it causes more mental fogginess. She is feeling better since started on Cymbalta in December 2016. Average pain now is a 3-4/10  Mild Major Depression: feeling overwhelmed, over the past 6 months she has been under a lot of stress. Initially problems at her church - new pastor - and in November she started to home-schooling her grand-daughter, very worried about her daughter' marriage, she just found out that she was having an affair ( seeing another person - but no sexual contact ). Her son in law is very depressed and threatening to kill himself and lately even threatening to kill her daughter - but not recently - he is on anti-depressant and seems to be doing better.  Husband job will be affect by new laws and she will have more work to do starting January 1st, 2017. She is on Cymbalta, it causes nausea , but better now, taking only one capsule daily. She has noticed that she is able to stay focused better, able to handle  stress better, she still wakes up during the night, but able to fall back asleep quickly - within 10 minutes, no longer has crying spells. She lost her dog of 17 years last week, but only cried a little.    Right jaw pain: she developed jaw pain a few days ago, there is a constant soreness, but worse with pressure on the area, not when she opens or closes her jaw, not associated to eating or  drinking cold fluids. No fever, no chills. Similar problems about 6 months ago and resolved by itself.   Patient Active Problem List   Diagnosis Date Noted  . Long-term use of high-risk medication 02/25/2015  . Increased urinary frequency 08/01/2014  . Fibrocystic breast disease 07/29/2014  . Mild major depression ([place]) 07/29/2014  . Gastric reflux 07/29/2014  . Chronic interstitial cystitis 07/29/2014  . Calculus of kidney 07/29/2014  . Allergic rhinitis 07/29/2014  . Acne erythematosa 07/29/2014  . Lumbosacral radiculitis 07/29/2014  . Tinnitus 07/29/2014  . Mixed incontinence, urge and stress (female) (female) 08/23/2013  . Hyperlipidemia 04/25/2013  . H/O: hysterectomy 11/30/2011  . Atrial flutter ([place]) 05/03/2011  . Diet-controlled type 2 diabetes mellitus ([place]) 04/16/2011  . Bladder cystocele 01/05/2011  . Migraine without aura and without status migrainosus, not intractable 12/29/2010  . Fibromyalgia 12/29/2010  . Obesity 12/29/2010    Past Surgical History  Procedure Laterality Date  . Gallbladder surgery  2004  . Dilation and curettage of uterus  05/05/2007  . Tonsillectomy  1961  . Cesarean section      x 1  . Vaginal hysterectomy  11/30/2011    Procedure: HYSTERECTOMY VAGINAL;  Surgeon: Emily Filbert, MD;  Location: [facility];  Service: Gynecology;  Laterality: N/A;  . Anterior and posterior repair  11/30/2011  Procedure: ANTERIOR (CYSTOCELE) AND POSTERIOR REPAIR (RECTOCELE);  Surgeon: Reece Packer, MD;  Location: [facility];  Service: Urology;  Laterality: N/A;  with cysto  . Bladder suspension  11/30/2011    Procedure: [place] PROCEDURE;  Surgeon: Reece Packer, MD;  Location: [facility];  Service: Urology;  Laterality: N/A;  graft 10x6  . Cystoscopy  11/30/2011    Procedure: CYSTOSCOPY;  Surgeon: Reece Packer, MD;  Location: [facility];  Service: Urology;;  . Colonoscopy  2009  . Breast biopsy Right 2007    negative    Family History  Problem Relation Age of  Onset  . Diabetes Mother   . Hearing loss Mother   . Hypertension Mother   . Heart disease Father   . Heart disease Paternal Grandfather   . Asthma Daughter   . Asthma Daughter   . Migraines Daughter   . Fibromyalgia Daughter   . Interstitial cystitis Daughter   . Asthma Daughter   . Colitis Daughter     Social History   Social History  . Marital Status: Married    Spouse Name: N/A  . Number of Children: N/A  . Years of Education: N/A   Occupational History  . Not on file.   Social History Main Topics  . Smoking status: Never Smoker   . Smokeless tobacco: Never Used  . Alcohol Use: 0.0 oz/week    0 Standard drinks or equivalent per week     Comment: rare  . Drug Use: No  . Sexual Activity:    Partners: Male   Other Topics Concern  . Not on file   Social History Narrative     Current outpatient prescriptions:  .  amiodarone (PACERONE) 200 MG tablet, Take 1 tablet (200 mg total) by mouth 2 (two) times daily as needed., Disp: 60 tablet, Rfl: 6 .  aspirin EC 81 MG tablet, Take 81 mg by mouth daily., Disp: , Rfl:  .  Azelaic Acid (FINACEA) 15 % cream, Apply 1 application topically 2 (two) times daily as needed. For rosacea, Disp: , Rfl:  .  Calcium Carbonate-Vitamin D (CALTRATE 600+D) 600-400 MG-UNIT per tablet, Take 1 tablet by mouth 2 (two) times daily., Disp: , Rfl:  .  clonazePAM (KLONOPIN) 0.5 MG tablet, Take 1 tablet (0.5 mg total) by mouth as needed., Disp: 30 tablet, Rfl: 2 .  DULoxetine (CYMBALTA) 30 MG capsule, Take 1-2 capsules (30-60 mg total) by mouth daily., Disp: 60 capsule, Rfl: 0 .  gabapentin (NEURONTIN) 100 MG capsule, Take 1 capsule (100 mg total) by mouth 2 (two) times daily., Disp: 60 capsule, Rfl: 5 .  magnesium oxide (MAG-OX) 400 MG tablet, Take 400 mg by mouth daily., Disp: , Rfl:  .  metaxalone (SKELAXIN) 800 MG tablet, Take 1 tablet (800 mg total) by mouth at bedtime as needed for muscle spasms., Disp: 90 tablet, Rfl: 5 .  metoprolol tartrate  (LOPRESSOR) 25 MG tablet, Take 25 mg by mouth as needed., Disp: , Rfl:  .  mometasone (NASONEX) 50 MCG/ACT nasal spray, Place 2 sprays into the nose at bedtime., Disp: , Rfl:  .  naratriptan (AMERGE) 2.5 MG tablet, Take 1 tablet (2.5 mg total) by mouth as needed., Disp: 9 tablet, Rfl: 11 .  Probiotic Product (PROBIOTIC PO), Take 1 capsule by mouth daily. , Disp: , Rfl:  .  propranolol (INDERAL) 10 MG tablet, Take 1 tablet (10 mg total) by mouth 3 (three) times daily as needed (palpitations)., Disp: 90 tablet, Rfl: 4 .  ranitidine (ZANTAC) 150 MG capsule, Take 150 mg by mouth as needed for heartburn., Disp: , Rfl:  .  tamsulosin (FLOMAX) 0.4 MG CAPS capsule, Take 0.8 mg by mouth 2 (two) times daily., Disp: , Rfl:   Allergies  Allergen Reactions  . Adhesive [Tape] Dermatitis  . Levaquin  [Levofloxacin]   . Macrodantin [Nitrofurantoin] Nausea And Vomiting  . Metronidazole Nausea Only  . Nitrofurantoin Macrocrystal Nausea And Vomiting  . Sulfamethoxazole-Trimethoprim   . Cephalexin Rash  . Clindamycin Hcl Rash  . Doxycycline Swelling and Rash  . Erythromycin Rash     ROS  Constitutional: Negative for fever , positive for  weight change - Cymbalta has curbed her appetitie.  Respiratory: Negative for cough and shortness of breath.   Cardiovascular: Negative for chest pain or palpitations.  Gastrointestinal: Negative for abdominal pain, no bowel changes.  Musculoskeletal: Negative for gait problem or joint swelling.  Skin: Negative for rash.  Neurological: Negative for dizziness or headache.  No other specific complaints in a complete review of systems (except as listed in HPI above).  Objective  Filed Vitals:   03/04/15 0813  BP: 112/62  Pulse: 88  Temp: 98.2 F (36.8 C)  TempSrc: Oral  Resp: 16  Weight: 205 lb 6.4 oz (93.169 kg)  SpO2: 98%    Body mass index is 33.17 kg/(m^2).  Physical Exam  Constitutional: Patient appears well-developed and well-nourished. Obese  No  distress.  HEENT: head atraumatic, normocephalic, pupils equal and reactive to light, neck supple, throat within normal limits, tenderness during palpation of right sub-mandibulary gland Cardiovascular: Normal rate, regular rhythm and normal heart sounds.  No murmur heard. No BLE edema. Pulmonary/Chest: Effort normal and breath sounds normal. No respiratory distress. Abdominal: Soft.  There is no tenderness. Psychiatric: Patient has a normal mood and affect. behavior is normal. Judgment and thought content normal.  Recent Results (from the past 2160 hour(s))  POCT HgB A1C     Status: None   Collection Time: 01/31/15  8:50 AM  Result Value Ref Range   Hemoglobin A1C 5.6   POCT Glucose (CBG)     Status: None   Collection Time: 01/31/15  8:51 AM  Result Value Ref Range   POC Glucose 92 70 - 99 mg/dl     PHQ2/9: Depression screen [facility] 2/9 03/04/2015 01/31/2015 08/01/2014  Decreased Interest 0 0 0  Down, Depressed, Hopeless 0 0 0  PHQ - 2 Score 0 0 0    Fall Risk: Fall Risk  03/04/2015 01/31/2015 08/01/2014 08/01/2014  Falls in the past year? No No No No    Functional Status Survey: Is the patient deaf or have difficulty hearing?: No Does the patient have difficulty seeing, even when wearing glasses/contacts?: Yes (reading glasses) Does the patient have difficulty concentrating, remembering, or making decisions?: No Does the patient have difficulty walking or climbing stairs?: No Does the patient have difficulty dressing or bathing?: No Does the patient have difficulty doing errands alone such as visiting a doctor's office or shopping?: No    Assessment & Plan  1. Mild major depression ([place])  Doing better, advised to try going up on Duloxetine dose to 60 mg - DULoxetine (CYMBALTA) 30 MG capsule; Take 1-2 capsules (30-60 mg total) by mouth daily.  Dispense: 60 capsule; Refill: 0  2. Fibromyalgia  Pain is better controlled with medication, her hip is not painful now - DULoxetine  (CYMBALTA) 30 MG capsule; Take 1-2 capsules (30-60 mg total) by mouth daily.  Dispense: 60  capsule; Refill: 0  3. Sialoadenitis of submandibular gland  Likely the cause of right jaw pain, advised fluid intake to be increased, lemon drops, or sour drinks and call back if no improvement, just went to the dentist two weeks ago and no dental problems.  - amoxicillin-clavulanate (AUGMENTIN) 875-125 MG tablet; Take 1 tablet by mouth 2 (two) times daily.  Dispense: 20 tablet; Refill: 0  4. Antibiotic-induced yeast infection  - fluconazole (DIFLUCAN) 150 MG tablet; Take 1 tablet (150 mg total) by mouth every other day.  Dispense: 3 tablet; Refill: 0

## 2015-04-23 ENCOUNTER — Other Ambulatory Visit: Payer: Self-pay | Admitting: Family Medicine

## 2015-04-23 ENCOUNTER — Encounter: Payer: Self-pay | Admitting: Family Medicine

## 2015-04-23 MED ORDER — DULOXETINE HCL 30 MG PO CPEP: 30.0000 mg | ORAL_CAPSULE | Freq: Every day | ORAL | Status: DC

## 2015-06-02 ENCOUNTER — Ambulatory Visit (INDEPENDENT_AMBULATORY_CARE_PROVIDER_SITE_OTHER): Payer: BLUE CROSS/BLUE SHIELD | Admitting: Family Medicine

## 2015-06-02 ENCOUNTER — Encounter: Payer: Self-pay | Admitting: Family Medicine

## 2015-06-02 VITALS — BP 122/64 | HR 74 | Temp 98.5°F | Resp 18 | Ht 65.0 in | Wt 208.5 lb

## 2015-06-02 DIAGNOSIS — M797 Fibromyalgia: Secondary | ICD-10-CM | POA: Diagnosis not present

## 2015-06-02 DIAGNOSIS — I4892 Unspecified atrial flutter: Secondary | ICD-10-CM

## 2015-06-02 DIAGNOSIS — E785 Hyperlipidemia, unspecified: Secondary | ICD-10-CM

## 2015-06-02 DIAGNOSIS — Z79899 Other long term (current) drug therapy: Secondary | ICD-10-CM

## 2015-06-02 DIAGNOSIS — E119 Type 2 diabetes mellitus without complications: Secondary | ICD-10-CM

## 2015-06-02 DIAGNOSIS — F320 Major depressive disorder, single episode, mild: Secondary | ICD-10-CM

## 2015-06-02 DIAGNOSIS — G43009 Migraine without aura, not intractable, without status migrainosus: Secondary | ICD-10-CM

## 2015-06-02 MED ORDER — DULOXETINE HCL 60 MG PO CPEP: 60.0000 mg | ORAL_CAPSULE | Freq: Every day | ORAL | Status: DC

## 2015-06-02 MED ORDER — NARATRIPTAN HCL 2.5 MG PO TABS: 2.5000 mg | ORAL_TABLET | ORAL | Status: DC | PRN

## 2015-06-02 NOTE — Progress Notes (Signed)
Name: Sonya Lopez   MRN: [id number]    DOB: 01/19/1956   Date:06/02/2015       Progress Note  Subjective  Chief Complaint  Chief Complaint  Patient presents with  . Medication Refill    3 month F/U  . Depression    Takes medication due to stressful situations at 60 mg and normal days at 30 mg   . Fibromyalgia    With Cymbalta symptoms have improved  . Diabetes    Has a meter and used to check it but mother has been sick and going back and forth to [facility] for rehab. Patient states she has not had any symptoms and has diet controlled DM.  Marland Kitchen Migraine    Stable, once weekly-stress, fumes-flower based is a trigger.  . Gastroesophageal Reflux    Well controlled    HPI  FMS: stopped Mobic because of possible renal side effects. She states that Cymbalta and Gabapentin seems to be helping with symptoms. Pain is down from 8/10, to 6/10 at night, and pain is lower during the day.   Atrial Flutter:  Episodes are stable with Amiodarone , HR goes up to 150's - seeing Dr. Rockey Situ.   Migraine Headaches: episodes about once a week, usually on left maxillary area and radiates to left forehead and town to left side of neck, medication kicks in within 40 minutes, sometimes associated with nausea.   Mild Major Depression: feeling overwhelmed, over the past 12  months she has been under a lot of stress. Initially problems at her church - new pastor ( they now moved to another church in [place] ) and in November she started to home-schooling her grand-daughter, very worried about her daughter' marriage, she just found out that she was having an affair ( seeing another person - but no sexual contact ) Daughter is doing better now. Her son in law is very depressed and threatening to kill himself and lately even threatening to kill her daughter - but not recently - he is on anti-depressant and seems to be doing better, no recent threats. Husband job will be affect by new laws and she will have more work  since January 2017, mother was hospitalized recently and is at [facility].  She is on Cymbalta and states it is helping her deal with all the stress in her life  DMII: she is on diet only and hgbA1C is under control, urine micro was negative last visit. She denies polyphagia, no polyuria or polydipsia. Eye exam is up to date  GERD: under control with medication, she tries alternating Prilosec and Zantac.   Patient Active Problem List   Diagnosis Date Noted  . Long-term use of high-risk medication 02/25/2015  . Increased urinary frequency 08/01/2014  . Fibrocystic breast disease 07/29/2014  . Mild major depression ([place]) 07/29/2014  . Gastric reflux 07/29/2014  . Chronic interstitial cystitis 07/29/2014  . Calculus of kidney 07/29/2014  . Allergic rhinitis 07/29/2014  . Acne erythematosa 07/29/2014  . Lumbosacral radiculitis 07/29/2014  . Tinnitus 07/29/2014  . Mixed incontinence, urge and stress (female) (female) 08/23/2013  . Hyperlipidemia 04/25/2013  . H/O: hysterectomy 11/30/2011  . Atrial flutter ([place]) 05/03/2011  . Diet-controlled type 2 diabetes mellitus ([place]) 04/16/2011  . Bladder cystocele 01/05/2011  . Migraine without aura and without status migrainosus, not intractable 12/29/2010  . Fibromyalgia 12/29/2010  . Obesity 12/29/2010    Past Surgical History  Procedure Laterality Date  . Gallbladder surgery  2004  . Dilation and curettage  of uterus  05/05/2007  . Tonsillectomy  1961  . Cesarean section      x 1  . Vaginal hysterectomy  11/30/2011    Procedure: HYSTERECTOMY VAGINAL;  Surgeon: Emily Filbert, MD;  Location: [facility];  Service: Gynecology;  Laterality: N/A;  . Anterior and posterior repair  11/30/2011    Procedure: ANTERIOR (CYSTOCELE) AND POSTERIOR REPAIR (RECTOCELE);  Surgeon: Reece Packer, MD;  Location: [facility];  Service: Urology;  Laterality: N/A;  with cysto  . Bladder suspension  11/30/2011    Procedure: [place] PROCEDURE;  Surgeon: Reece Packer,  MD;  Location: [facility];  Service: Urology;  Laterality: N/A;  graft 10x6  . Cystoscopy  11/30/2011    Procedure: CYSTOSCOPY;  Surgeon: Reece Packer, MD;  Location: [facility];  Service: Urology;;  . Colonoscopy  2009  . Breast biopsy Right 2007    negative    Family History  Problem Relation Age of Onset  . Diabetes Mother   . Hearing loss Mother   . Hypertension Mother   . Heart disease Father   . Heart disease Paternal Grandfather   . Asthma Daughter   . Asthma Daughter   . Migraines Daughter   . Fibromyalgia Daughter   . Interstitial cystitis Daughter   . Asthma Daughter   . Colitis Daughter     Social History   Social History  . Marital Status: Married    Spouse Name: N/A  . Number of Children: N/A  . Years of Education: N/A   Occupational History  . Not on file.   Social History Main Topics  . Smoking status: Never Smoker   . Smokeless tobacco: Never Used  . Alcohol Use: 0.0 oz/week    0 Standard drinks or equivalent per week     Comment: rare  . Drug Use: No  . Sexual Activity:    Partners: Male   Other Topics Concern  . Not on file   Social History Narrative     Current outpatient prescriptions:  .  amiodarone (PACERONE) 200 MG tablet, Take 1 tablet (200 mg total) by mouth 2 (two) times daily as needed. (Patient taking differently: Take 200 mg by mouth daily. ), Disp: 60 tablet, Rfl: 6 .  aspirin EC 81 MG tablet, Take 81 mg by mouth daily., Disp: , Rfl:  .  Azelaic Acid (FINACEA) 15 % cream, Apply 1 application topically 2 (two) times daily as needed. For rosacea, Disp: , Rfl:  .  Calcium Carbonate-Vitamin D (CALTRATE 600+D) 600-400 MG-UNIT per tablet, Take 1 tablet by mouth 2 (two) times daily., Disp: , Rfl:  .  Clindamycin Phosphate foam, , Disp: , Rfl: 5 .  DULoxetine (CYMBALTA) 60 MG capsule, Take 1 capsule (60 mg total) by mouth daily., Disp: 30 capsule, Rfl: 2 .  gabapentin (NEURONTIN) 100 MG capsule, Take 1 capsule (100 mg total) by mouth 2  (two) times daily., Disp: 60 capsule, Rfl: 5 .  magnesium oxide (MAG-OX) 400 MG tablet, Take 400 mg by mouth daily., Disp: , Rfl:  .  metaxalone (SKELAXIN) 800 MG tablet, Take 1 tablet (800 mg total) by mouth at bedtime as needed for muscle spasms., Disp: 90 tablet, Rfl: 5 .  metoprolol tartrate (LOPRESSOR) 25 MG tablet, Take 25 mg by mouth as needed., Disp: , Rfl:  .  mometasone (NASONEX) 50 MCG/ACT nasal spray, Place 2 sprays into the nose at bedtime., Disp: , Rfl:  .  naratriptan (AMERGE) 2.5 MG tablet, Take 1  tablet (2.5 mg total) by mouth as needed., Disp: 9 tablet, Rfl: 5 .  Probiotic Product (PROBIOTIC PO), Take 1 capsule by mouth daily. , Disp: , Rfl:  .  propranolol (INDERAL) 10 MG tablet, Take 1 tablet (10 mg total) by mouth 3 (three) times daily as needed (palpitations)., Disp: 90 tablet, Rfl: 4 .  ranitidine (ZANTAC) 150 MG capsule, Take 150 mg by mouth as needed for heartburn., Disp: , Rfl:  .  tamsulosin (FLOMAX) 0.4 MG CAPS capsule, Take 0.8 mg by mouth 2 (two) times daily., Disp: , Rfl:   Allergies  Allergen Reactions  . Adhesive [Tape] Dermatitis  . Levaquin  [Levofloxacin]   . Macrodantin [Nitrofurantoin] Nausea And Vomiting  . Metronidazole Nausea Only  . Nitrofurantoin Macrocrystal Nausea And Vomiting  . Sulfamethoxazole-Trimethoprim   . Cephalexin Rash  . Clindamycin Hcl Rash  . Doxycycline Swelling and Rash  . Erythromycin Rash     ROS  Ten systems reviewed and is negative except as mentioned in HPI   Objective  Filed Vitals:   06/02/15 0953  BP: 122/64  Pulse: 74  Temp: 98.5 F (36.9 C)  TempSrc: Oral  Resp: 18  Height: 5\' 5"  (1.651 m)  Weight: 208 lb 8 oz (94.575 kg)  SpO2: 98%    Body mass index is 34.7 kg/(m^2).  Physical Exam  Constitutional: Patient appears well-developed and well-nourished. Obese No distress.  HEENT: head atraumatic, normocephalic, pupils equal and reactive to light,  neck supple, throat within normal  limits Cardiovascular: Normal rate, regular rhythm and normal heart sounds.  No murmur heard. No BLE edema. Pulmonary/Chest: Effort normal and breath sounds normal. No respiratory distress. Abdominal: Soft.  There is no tenderness. Psychiatric: Patient has a normal mood and affect. behavior is normal. Judgment and thought content normal. Muscular Skeletal: trigger points positive  PHQ2/9: Depression screen [facility] 2/9 06/02/2015 03/04/2015 01/31/2015 08/01/2014  Decreased Interest 0 0 0 0  Down, Depressed, Hopeless 0 0 0 0  PHQ - 2 Score 0 0 0 0     Fall Risk: Fall Risk  06/02/2015 03/04/2015 01/31/2015 08/01/2014 08/01/2014  Falls in the past year? No No No No No      Functional Status Survey: Is the patient deaf or have difficulty hearing?: No Does the patient have difficulty seeing, even when wearing glasses/contacts?: No Does the patient have difficulty concentrating, remembering, or making decisions?: No Does the patient have difficulty walking or climbing stairs?: No Does the patient have difficulty dressing or bathing?: No Does the patient have difficulty doing errands alone such as visiting a doctor's office or shopping?: No    Assessment & Plan  1. Fibromyalgia  Stable on Gabapentin and Cymbalta  2. Mild major depression ([facility])  - DULoxetine (CYMBALTA) 60 MG capsule; Take 1 capsule (60 mg total) by mouth daily.  Dispense: 30 capsule; Refill: 2  3. Diet-controlled type 2 diabetes mellitus ([place])  Doing well with life style modification  4. Migraine without aura and without status migrainosus, not intractable  - naratriptan (AMERGE) 2.5 MG tablet; Take 1 tablet (2.5 mg total) by mouth as needed.  Dispense: 9 tablet; Refill: 5  5. Hyperlipidemia  Not on medication   6. Atrial flutter, unspecified type ([place])  Continue follow up with Dr. Rockey Situ

## 2015-06-04 ENCOUNTER — Other Ambulatory Visit: Payer: Self-pay | Admitting: Family Medicine

## 2015-06-05 LAB — COMPREHENSIVE METABOLIC PANEL
A/G RATIO: 1.8 (ref 1.2–2.2)
ALBUMIN: 4.3 g/dL (ref 3.6–4.8)
ALK PHOS: 156 IU/L — AB (ref 39–117)
ALT: 14 IU/L (ref 0–32)
AST: 16 IU/L (ref 0–40)
BUN/Creatinine Ratio: 18 (ref 12–28)
BUN: 16 mg/dL (ref 8–27)
Bilirubin Total: 0.3 mg/dL (ref 0.0–1.2)
CO2: 24 mmol/L (ref 18–29)
CREATININE: 0.9 mg/dL (ref 0.57–1.00)
Calcium: 9.6 mg/dL (ref 8.7–10.3)
Chloride: 101 mmol/L (ref 96–106)
GFR calc Af Amer: 79 mL/min/{1.73_m2} (ref >59)
GFR calc non Af Amer: 69 mL/min/{1.73_m2} (ref >59)
GLOBULIN, TOTAL: 2.4 g/dL (ref 1.5–4.5)
Glucose: 89 mg/dL (ref 65–99)
POTASSIUM: 4.7 mmol/L (ref 3.5–5.2)
SODIUM: 142 mmol/L (ref 134–144)
Total Protein: 6.7 g/dL (ref 6.0–8.5)

## 2015-06-05 LAB — LIPID PANEL
CHOL/HDL RATIO: 3.6 ratio (ref 0.0–4.4)
CHOLESTEROL TOTAL: 209 mg/dL — AB (ref 100–199)
HDL: 58 mg/dL (ref >39)
LDL CALC: 132 mg/dL — AB (ref 0–99)
TRIGLYCERIDES: 97 mg/dL (ref 0–149)
VLDL Cholesterol Cal: 19 mg/dL (ref 5–40)

## 2015-06-05 LAB — TSH: TSH: 3.98 u[IU]/mL (ref 0.450–4.500)

## 2015-06-05 LAB — HEMOGLOBIN A1C
ESTIMATED AVERAGE GLUCOSE: 114 mg/dL
HEMOGLOBIN A1C: 5.6 % (ref 4.8–5.6)

## 2015-08-01 ENCOUNTER — Other Ambulatory Visit: Payer: Self-pay | Admitting: Family Medicine

## 2015-08-01 DIAGNOSIS — Z1231 Encounter for screening mammogram for malignant neoplasm of breast: Secondary | ICD-10-CM

## 2015-08-21 ENCOUNTER — Other Ambulatory Visit: Payer: Self-pay | Admitting: Family Medicine

## 2015-08-21 ENCOUNTER — Ambulatory Visit
Admission: RE | Admit: 2015-08-21 | Discharge: 2015-08-21 | Disposition: A | Discharge status: Home / Self Care | Payer: BLUE CROSS/BLUE SHIELD | Source: Ambulatory Visit | Attending: Family Medicine | Admitting: Family Medicine

## 2015-08-21 DIAGNOSIS — Z1231 Encounter for screening mammogram for malignant neoplasm of breast: Secondary | ICD-10-CM | POA: Insufficient documentation

## 2015-08-21 DIAGNOSIS — R921 Mammographic calcification found on diagnostic imaging of breast: Secondary | ICD-10-CM | POA: Diagnosis not present

## 2015-08-24 NOTE — Progress Notes (Signed)
Cardiology Office Note  Date:  08/25/2015   ID:  Sonya Lopez, DOB [birth year], MRN [id number]  PCP:  Loistine Chance, MD   Chief Complaint  Patient presents with  . Other    6 month f/u. Meds reviewed verbally with pt.    HPI:  [name] is a very pleasant 60 year old woman with a history of diabetes, GERD, mild obesity with recent hospital admission in 04/16/2011 for atrial flutter with RVR. She presents for routine followup of her arrhythmia.   In follow-up today, she reports that she is doing very well Since starting amiodarone, she has had a dramatic improvement in her arrhythmia Very rare short episodes, nothing significant She is actually taking amiodarone 100 mg daily She's not had to take metoprolol or propranolol No significant shortness of breath, she has chronic mild nonpitting lower extremity edema worse in the summer  Recent lab work reviewed with her Total chole 200, LDL 132 Hemoglobin A1c in the 5 range  Father had significant history of coronary artery disease but was a heavy smoker  EKG on today's visit shows normal sinus rhythm with rate 61 bpm, no significant ST or T-wave changes  Other past medical history reviewed She details numerous episodes of atrial flutter at the end of 2016 Episode September 6, September 9, September 19, September 26, September 30, October 26, 12/08/2014, November 18, November 25, December 22 Typically episodes will last 1.5 up to 2 hours  Heart rate typically runs 110, sometimes 155 bpm, one episode 170 bpm Relatively symptomatic when she has episodes  She does report significant stress in the family, particularly over the summer 2016 She was started on Cymbalta with improvement of her symptoms. Feels less stressed  EKG on today's visit shows sinus rhythm with rate 62 bpm, no significant ST or T-wave changes  Previous episodes of atrial flutter in November 2015, January 2016 typically lasting less than 2 hours In  the past she did not want to take a medication every day  She takes care of her mother who has significant medical issues. Significant stress at home. She does report having some chest pain in February and early March 2015 which seemed to resolve by starting a proton pump inhibitor. No further chest discomfort with biking or other exercise.  Previous  Echocardiogram is essentially normal with normal systolic function, ejection fraction greater than 55%, no significant valvular disease for wall motion abnormality. Total cholesterol 196, LDL 121, HDL 59. Cholesterol is up from 160s since then she has been taking great   PMH:   has a past medical history of Allergy; Anxiety; Arrhythmia; Depression; Difficult intubation (2009); Female cystocele (01/05/2011); Fibromyalgia; GERD (gastroesophageal reflux disease); Headache(784.0); Obesity; and PONV (postoperative nausea and vomiting).  PSH:    Past Surgical History:  Procedure Laterality Date  . ANTERIOR AND POSTERIOR REPAIR  11/30/2011   Procedure: ANTERIOR (CYSTOCELE) AND POSTERIOR REPAIR (RECTOCELE);  Surgeon: Reece Packer, MD;  Location: [facility];  Service: Urology;  Laterality: N/A;  with cysto  . BLADDER SUSPENSION  11/30/2011   Procedure: [facility] PROCEDURE;  Surgeon: Reece Packer, MD;  Location: [facility] [place] [facility];  Service: Urology;  Laterality: N/A;  graft 10x6  . BREAST BIOPSY Right 2007   negative core  . CESAREAN SECTION     x 1  . COLONOSCOPY  2009  . CYSTOSCOPY  11/30/2011   Procedure: CYSTOSCOPY;  Surgeon: Reece Packer, MD;  Location: [facility];  Service: Urology;;  . Brigitte Pulse AND CURETTAGE OF  UTERUS  05/05/2007  . GALLBLADDER SURGERY  2004  . TONSILLECTOMY  1961  . VAGINAL HYSTERECTOMY  11/30/2011   Procedure: HYSTERECTOMY VAGINAL;  Surgeon: Emily Filbert, MD;  Location: [facility];  Service: Gynecology;  Laterality: N/A;    Current Outpatient Prescriptions  Medication Sig Dispense Refill  . amiodarone (PACERONE) 200 MG tablet Take  1 tablet (200 mg total) by mouth 2 (two) times daily as needed. (Patient taking differently: Take 200 mg by mouth daily. ) 60 tablet 6  . aspirin EC 81 MG tablet Take 81 mg by mouth daily.    . Azelaic Acid (FINACEA) 15 % cream Apply 1 application topically 2 (two) times daily as needed. For rosacea    . Calcium Carbonate-Vitamin D (CALTRATE 600+D) 600-400 MG-UNIT per tablet Take 1 tablet by mouth 2 (two) times daily.    . Clindamycin Phosphate foam   5  . DULoxetine (CYMBALTA) 60 MG capsule Take 1 capsule (60 mg total) by mouth daily. 30 capsule 2  . gabapentin (NEURONTIN) 100 MG capsule Take 1 capsule (100 mg total) by mouth 2 (two) times daily. 60 capsule 5  . magnesium oxide (MAG-OX) 400 MG tablet Take 400 mg by mouth daily.    . metaxalone (SKELAXIN) 800 MG tablet Take 1 tablet (800 mg total) by mouth at bedtime as needed for muscle spasms. 90 tablet 5  . metoprolol tartrate (LOPRESSOR) 25 MG tablet Take 25 mg by mouth as needed.    . mometasone (NASONEX) 50 MCG/ACT nasal spray Place 2 sprays into the nose at bedtime.    . naratriptan (AMERGE) 2.5 MG tablet Take 1 tablet (2.5 mg total) by mouth as needed. 9 tablet 5  . Probiotic Product (PROBIOTIC PO) Take 1 capsule by mouth daily.     . propranolol (INDERAL) 10 MG tablet Take 1 tablet (10 mg total) by mouth 3 (three) times daily as needed (palpitations). 90 tablet 4  . ranitidine (ZANTAC) 150 MG capsule Take 150 mg by mouth as needed for heartburn.    . tamsulosin (FLOMAX) 0.4 MG CAPS capsule Take 0.8 mg by mouth 2 (two) times daily.     No current facility-administered medications for this visit.      Allergies:   Adhesive [tape]; Levaquin  [levofloxacin]; Macrodantin [nitrofurantoin]; Metronidazole; Nitrofurantoin macrocrystal; Sulfamethoxazole-trimethoprim; Cephalexin; Clindamycin hcl; Doxycycline; and Erythromycin   Social History:  The patient  reports that she has never smoked. She has never used smokeless tobacco. She reports that  she drinks alcohol. She reports that she does not use drugs.   Family History:   family history includes Asthma in her daughter, daughter, and daughter; Colitis in her daughter; Diabetes in her mother; Fibromyalgia in her daughter; Hearing loss in her mother; Heart disease in her father and paternal grandfather; Hypertension in her mother; Interstitial cystitis in her daughter; Migraines in her daughter.    Review of Systems: Review of Systems  Constitutional: Negative.   Respiratory: Negative.   Cardiovascular: Positive for palpitations.  Gastrointestinal: Negative.   Musculoskeletal: Negative.   Neurological: Negative.   Psychiatric/Behavioral: Negative.   All other systems reviewed and are negative.    PHYSICAL EXAM: VS:  BP 110/68   Pulse 61   Ht 5\' 6"  (1.676 m)   Wt 211 lb 4 oz (95.8 kg)   BMI 34.10 kg/m  , BMI Body mass index is 34.1 kg/m. GEN: Well nourished, well developed, in no acute distress  HEENT: normal  Neck: no JVD, carotid bruits, or masses  Cardiac: RRR; no murmurs, rubs, or gallops,no edema  Respiratory:  clear to auscultation bilaterally, normal work of breathing GI: soft, nontender, nondistended, + BS MS: no deformity or atrophy  Skin: warm and dry, no rash Neuro:  Strength and sensation are intact Psych: euthymic mood, full affect    Recent Labs: 06/04/2015: ALT 14; BUN 16; Creatinine, Ser 0.90; Potassium 4.7; Sodium 142; TSH 3.980    Lipid Panel Lab Results  Component Value Date   CHOL 209 (H) 06/04/2015   HDL 58 06/04/2015   LDLCALC 132 (H) 06/04/2015   TRIG 97 06/04/2015      Wt Readings from Last 3 Encounters:  08/25/15 211 lb 4 oz (95.8 kg)  06/02/15 208 lb 8 oz (94.6 kg)  03/04/15 205 lb 6.4 oz (93.2 kg)       ASSESSMENT AND PLAN:  Atrial flutter, unspecified type ([place]) - Plan: EKG 12-Lead  Minimal episodes of flutter as detailed above, no changes to her medications We did discuss that there is no strong indication for  ablation at this time given lack of symptoms Currently not on anticoagulation given she is very symptomatic when she has episodes, minimal events  Hyperlipidemia Long discussion concerning cholesterol management , Recommended weight loss, no indication for cholesterol medication unless she prefers aggressive management We did discuss CT coronary calcium scoring, she will research this at home  Disposition:   F/U  12 months   Total encounter time more than 15 minutes  Greater than 50% was spent in counseling and coordination of care with the patient    Orders Placed This Encounter  Procedures  . EKG 12-Lead     Signed, Esmond Plants, M.D., Ph.D. 08/25/2015  [place], [place]

## 2015-08-25 ENCOUNTER — Other Ambulatory Visit: Payer: Self-pay | Admitting: Family Medicine

## 2015-08-25 ENCOUNTER — Encounter: Payer: Self-pay | Admitting: Cardiovascular Disease

## 2015-08-25 ENCOUNTER — Ambulatory Visit (INDEPENDENT_AMBULATORY_CARE_PROVIDER_SITE_OTHER): Payer: BLUE CROSS/BLUE SHIELD | Admitting: Cardiovascular Disease

## 2015-08-25 VITALS — BP 110/68 | HR 61 | Ht 66.0 in | Wt 211.2 lb

## 2015-08-25 DIAGNOSIS — E785 Hyperlipidemia, unspecified: Secondary | ICD-10-CM

## 2015-08-25 DIAGNOSIS — R928 Other abnormal and inconclusive findings on diagnostic imaging of breast: Secondary | ICD-10-CM

## 2015-08-25 DIAGNOSIS — I4892 Unspecified atrial flutter: Secondary | ICD-10-CM | POA: Diagnosis not present

## 2015-08-25 NOTE — Patient Instructions (Signed)
Medication Instructions:   No new changes If you have more episodes of atrial flutter,  Take more amiodarone Call the office for frequent episodes    Testing/Procedures:  Consider a CT coronary calcium score ($150) in [place] Looks for calcified plaque in coronary arteries  Follow-Up: It was a pleasure seeing you in the office today. Please call us if you have new issues that need to be addressed before your next appt.  [phone]  Your physician wants you to follow-up in: 12 months.  You will receive a reminder letter in the mail two months in advance. If you don't receive a letter, please call our office to schedule the follow-up appointment.  If you need a refill on your cardiac medications before your next appointment, please call your pharmacy.

## 2015-08-28 ENCOUNTER — Other Ambulatory Visit: Payer: Self-pay

## 2015-08-28 DIAGNOSIS — Z8249 Family history of ischemic heart disease and other diseases of the circulatory system: Secondary | ICD-10-CM

## 2015-09-01 ENCOUNTER — Other Ambulatory Visit: Payer: Self-pay | Admitting: Family Medicine

## 2015-09-01 ENCOUNTER — Ambulatory Visit
Admission: RE | Admit: 2015-09-01 | Discharge: 2015-09-01 | Disposition: A | Discharge status: Home / Self Care | Payer: BLUE CROSS/BLUE SHIELD | Source: Ambulatory Visit | Attending: Family Medicine | Admitting: Family Medicine

## 2015-09-01 DIAGNOSIS — R928 Other abnormal and inconclusive findings on diagnostic imaging of breast: Secondary | ICD-10-CM | POA: Diagnosis present

## 2015-09-01 DIAGNOSIS — R921 Mammographic calcification found on diagnostic imaging of breast: Secondary | ICD-10-CM

## 2015-09-03 ENCOUNTER — Other Ambulatory Visit: Payer: Self-pay | Admitting: Family Medicine

## 2015-09-03 ENCOUNTER — Ambulatory Visit (INDEPENDENT_AMBULATORY_CARE_PROVIDER_SITE_OTHER): Payer: BLUE CROSS/BLUE SHIELD | Admitting: General Surgery

## 2015-09-03 ENCOUNTER — Encounter: Payer: Self-pay | Admitting: General Surgery

## 2015-09-03 VITALS — BP 130/74 | HR 76 | Resp 12 | Ht 66.0 in | Wt 210.0 lb

## 2015-09-03 DIAGNOSIS — R920 Mammographic microcalcification found on diagnostic imaging of breast: Secondary | ICD-10-CM | POA: Diagnosis not present

## 2015-09-03 DIAGNOSIS — R928 Other abnormal and inconclusive findings on diagnostic imaging of breast: Secondary | ICD-10-CM

## 2015-09-03 NOTE — Patient Instructions (Signed)
Stereotactic Breast Biopsy A stereotactic breast biopsy is a procedure in which mammography is used in the collection of a sample of breast tissue. Mammography is a type of X-ray exam of the breasts that produces an image called a mammogram. The mammogram allows your health care provider to precisely locate the area of the breast from which a tissue sample will be taken. The tissue is then examined under a microscope to see if cancerous cells are present. A breast biopsy is done when:   A lump, abnormality, or mass is seen in the breast on a breast X-ray (mammogram).   Small calcium deposits (calcifications) are seen in the breast.   The shape or appearance of the breasts changes.   The shape or appearance of the nipples changes. You may have unusual or bloody discharge coming from the nipples, or you may have crusting, retraction, or dimpling of the nipples. A breast biopsy can indicate if you need surgery or other treatment.  LET YOUR HEALTH CARE PROVIDER KNOW ABOUT:  Any allergies you have.  All medicines you are taking, including vitamins, herbs, eye drops, creams, and over-the-counter medicines.  Previous problems you or members of your family have had with the use of anesthetics.  Any blood disorders you have.  Previous surgeries you have had.  Medical conditions you have. RISKS AND COMPLICATIONS Generally, stereotactic breast biopsy is a safe procedure. However, as with any procedure, complications can occur. Possible complications include:  Infection at the needle-insertion site.   Bleeding or bruising after surgery.  The breast may become altered or deformed as a result of the procedure.  The needle may go through the chest wall into the lung area.  BEFORE THE PROCEDURE  Wear a supportive bra to the procedure.  You will be asked to remove jewelry, dentures, eyeglasses, metal objects, or clothing that might interfere with the X-ray images. You may want to leave  some of these objects at home.  Arrange for someone to drive you home after the procedure if desired. PROCEDURE  A stereotactic breast biopsy is done while you are awake. During the procedure, relax as much as possible. Let your health care provider know if you are uncomfortable, anxious, or in pain. Usually, the only discomfort felt during the procedure is caused by staying in one position for the length of the procedure. This discomfort can be reduced by carefully placed cushions. Most of the time the biopsy is done using a table with openings on it. You will be asked to lie facedown on the table and place your breasts through the openings. Your breast is compressed between metal plates to get good X-ray images. Your skin will be cleaned, and a numbing medicine (local anesthetic) will be injected. A small cut (incision) will be made in your breast. The tip of the biopsy needle will be directed through the incision. Several small pieces of suspicious tissue will be taken. Then, a final set of X-ray images will be obtained. If they show that the suspicious tissue has been mostly or completely removed, a small clip will be left at the biopsy site. This is done so that the biopsy site can be easily located if the results of the biopsy show that the tissue is cancerous.  After the procedure, the incision will be stitched (sutured) or taped and covered with a bandage (dressing). Your health care provider may apply a pressure dressing and an ice pack to prevent bleeding and swelling in the breast.  A stereotactic   breast biopsy can take 30 minutes or more. AFTER THE PROCEDURE  If you are doing well and have no problems, you will be allowed to go home.    This information is not intended to replace advice given to you by your health care provider. Make sure you discuss any questions you have with your health care provider.   Document Released: 10/17/2002 Document Revised: 01/23/2013 Document Reviewed:  08/17/2012 Elsevier Interactive Patient Education 2016 [employer] Inc[employer]  

## 2015-09-03 NOTE — Progress Notes (Signed)
Patient ID: [name], female   DOB: January 09, 1956, 60 y.o.   MRN: [id number]  Chief Complaint  Patient presents with  . Follow-up    mammogram    HPI Sonya Lopez is a 60 y.o. female.  who presents for a breast evaluation. The most recent mammogram was done on 08-21-15.  Patient does perform regular self breast checks and gets regular mammograms done.  No new breast complaints..  No history of breast trauma.  The patient is accompanied today by her husband, Sonya Lopez.   HPI  Past Medical History:  Diagnosis Date  . Allergy   . Anxiety    situational anxiety  . Arrhythmia    A-fib/A-flutter  . Depression   . Difficult intubation 2009   Sonya Lopez had to use  'boogee"  . Female cystocele 01/05/2011  . Fibromyalgia   . GERD (gastroesophageal reflux disease)   . Headache(784.0)   . Obesity   . PONV (postoperative nausea and vomiting)    h/o difficult intubation and anes had to use "boogee"    Past Surgical History:  Procedure Laterality Date  . ANTERIOR AND POSTERIOR REPAIR  11/30/2011   Procedure: ANTERIOR (CYSTOCELE) AND POSTERIOR REPAIR (RECTOCELE);  Surgeon: Reece Packer, MD;  Location: [place] [facility];  Service: Urology;  Laterality: N/A;  with cysto  . BLADDER SUSPENSION  11/30/2011   Procedure: [facility] PROCEDURE;  Surgeon: Reece Packer, MD;  Location: [place] [facility];  Service: Urology;  Laterality: N/A;  graft 10x6  . BREAST BIOPSY Right 2007   negative core  . CESAREAN SECTION     x 1  . COLONOSCOPY  2009  . CYSTOSCOPY  11/30/2011   Procedure: CYSTOSCOPY;  Surgeon: Reece Packer, MD;  Location: [place] [facility];  Service: Urology;;  . Brigitte Pulse AND CURETTAGE OF UTERUS  05/05/2007  . GALLBLADDER SURGERY  2004  . TONSILLECTOMY  1961  . VAGINAL HYSTERECTOMY  11/30/2011   Procedure: HYSTERECTOMY VAGINAL;  Surgeon: Emily Filbert, MD;  Location: [place] [facility];  Service: Gynecology;  Laterality: N/A;    Family History  Problem Relation Age of Onset  .  Diabetes Mother   . Hearing loss Mother   . Hypertension Mother   . Heart disease Father   . Heart disease Paternal Grandfather   . Asthma Daughter   . Asthma Daughter   . Migraines Daughter   . Fibromyalgia Daughter   . Interstitial cystitis Daughter   . Asthma Daughter   . Colitis Daughter   . Breast cancer Neg Hx     Social History Social History  Substance Use Topics  . Smoking status: Never Smoker  . Smokeless tobacco: Never Used  . Alcohol use 0.0 oz/week     Comment: rare    Allergies  Allergen Reactions  . Adhesive [Tape] Dermatitis  . Levaquin  [Levofloxacin]   . Macrodantin [Nitrofurantoin] Nausea And Vomiting  . Metronidazole Nausea Only  . Nitrofurantoin Macrocrystal Nausea And Vomiting  . Sulfamethoxazole-Trimethoprim   . Cephalexin Rash  . Clindamycin Hcl Rash  . Doxycycline Swelling and Rash  . Erythromycin Rash    Current Outpatient Prescriptions  Medication Sig Dispense Refill  . amiodarone (PACERONE) 200 MG tablet Take 1 tablet (200 mg total) by mouth 2 (two) times daily as needed. (Patient taking differently: Take 200 mg by mouth daily. ) 60 tablet 6  . aspirin EC 81 MG tablet Take 81 mg by mouth daily.    . Azelaic Acid (FINACEA) 15 % cream  Apply 1 application topically 2 (two) times daily as needed. For rosacea    . Calcium Carbonate-Vitamin D (CALTRATE 600+D) 600-400 MG-UNIT per tablet Take 1 tablet by mouth 2 (two) times daily.    . Clindamycin Phosphate foam   5  . DULoxetine (CYMBALTA) 60 MG capsule Take 1 capsule (60 mg total) by mouth daily. 30 capsule 2  . gabapentin (NEURONTIN) 100 MG capsule Take 1 capsule (100 mg total) by mouth 2 (two) times daily. 60 capsule 5  . magnesium oxide (MAG-OX) 400 MG tablet Take 400 mg by mouth daily.    . metaxalone (SKELAXIN) 800 MG tablet Take 1 tablet (800 mg total) by mouth at bedtime as needed for muscle spasms. 90 tablet 5  . metoprolol tartrate (LOPRESSOR) 25 MG tablet Take 25 mg by mouth as needed.     . mometasone (NASONEX) 50 MCG/ACT nasal spray Place 2 sprays into the nose at bedtime.    . naratriptan (AMERGE) 2.5 MG tablet Take 1 tablet (2.5 mg total) by mouth as needed. 9 tablet 5  . Probiotic Product (PROBIOTIC PO) Take 1 capsule by mouth daily.     . propranolol (INDERAL) 10 MG tablet Take 1 tablet (10 mg total) by mouth 3 (three) times daily as needed (palpitations). 90 tablet 4  . ranitidine (ZANTAC) 150 MG capsule Take 150 mg by mouth as needed for heartburn.    . tamsulosin (FLOMAX) 0.4 MG CAPS capsule Take 0.8 mg by mouth 2 (two) times daily.     No current facility-administered medications for this visit.     Review of Systems Review of Systems  Constitutional: Negative.   Respiratory: Negative.   Cardiovascular: Negative.     Blood pressure 130/74, pulse 76, resp. rate 12, height 5\' 6"  (1.676 m), weight 210 lb (95.3 kg).  Physical Exam Physical Exam  Constitutional: She is oriented to person, place, and time. She appears well-developed and well-nourished.  HENT:  Mouth/Throat: Oropharynx is clear and moist.  Eyes: Conjunctivae are normal. No scleral icterus.  Neck: Neck supple.  Cardiovascular: Normal rate, regular rhythm and normal heart sounds.   Pulmonary/Chest: Effort normal and breath sounds normal. Right breast exhibits tenderness. Right breast exhibits no inverted nipple, no mass, no nipple discharge and no skin change. Left breast exhibits no inverted nipple, no mass, no nipple discharge, no skin change and no tenderness.  Tender UOQ right breast.  Lymphadenopathy:    She has no cervical adenopathy.    She has no axillary adenopathy.  Neurological: She is alert and oriented to person, place, and time.  Skin: Skin is warm and dry.  Psychiatric: Her behavior is normal.    Data Reviewed Screening mammogram dated 08/21/2015 showed a marked increased in microcalcifications in the upper-outer quadrant of the left breast. This was confirmed on dedicated  diagnostic mammograms dated 09/01/2015. These films were reviewed with the patient and her husband. BI-RADS-4.  Right breast shows multiple smoothly marginated structures corresponding to cyst evaluated with ultrasound in June 2016.  Assessment    Left breast microcalcifications, significant interval change over the last year.    Plan    Arrangements will be made to have a left breast stereotactic biopsy completed at the next available date. The patient will be contacted when biopsy results are available.     The stereotactic procedure was reviewed with the patient. The potential for bleeding, infection and pain was reviewed. At this time, the benefits outweigh the risk, and the patient is amenable to  proceed.   This information has been scribed by Karie Fetch RN, BSN,BC.    Robert Bellow 09/03/2015, 7:42 PM

## 2015-09-04 ENCOUNTER — Other Ambulatory Visit: Payer: Self-pay | Admitting: General Surgery

## 2015-09-04 ENCOUNTER — Other Ambulatory Visit: Payer: Self-pay

## 2015-09-04 DIAGNOSIS — R920 Mammographic microcalcification found on diagnostic imaging of breast: Secondary | ICD-10-CM

## 2015-09-05 ENCOUNTER — Ambulatory Visit (INDEPENDENT_AMBULATORY_CARE_PROVIDER_SITE_OTHER)
Admission: RE | Admit: 2015-09-05 | Discharge: 2015-09-05 | Disposition: A | Discharge status: Home / Self Care | Payer: Self-pay | Source: Ambulatory Visit | Attending: Cardiovascular Disease | Admitting: Cardiovascular Disease

## 2015-09-05 DIAGNOSIS — Z8249 Family history of ischemic heart disease and other diseases of the circulatory system: Secondary | ICD-10-CM

## 2015-09-09 ENCOUNTER — Encounter: Payer: Self-pay | Admitting: Cardiovascular Disease

## 2015-09-09 NOTE — Telephone Encounter (Signed)
Spoke with patient and provided her with preliminary results. She verbalized understanding and had no further questions at this time.

## 2015-09-12 ENCOUNTER — Ambulatory Visit
Admission: RE | Admit: 2015-09-12 | Discharge: 2015-09-12 | Disposition: A | Discharge status: Home / Self Care | Payer: BLUE CROSS/BLUE SHIELD | Source: Ambulatory Visit | Attending: General Surgery | Admitting: General Surgery

## 2015-09-12 ENCOUNTER — Encounter: Payer: Self-pay | Admitting: General Surgery

## 2015-09-12 DIAGNOSIS — R920 Mammographic microcalcification found on diagnostic imaging of breast: Secondary | ICD-10-CM | POA: Diagnosis present

## 2015-09-12 DIAGNOSIS — D0512 Intraductal carcinoma in situ of left breast: Secondary | ICD-10-CM | POA: Diagnosis not present

## 2015-09-14 ENCOUNTER — Encounter: Payer: Self-pay | Admitting: General Surgery

## 2015-09-15 ENCOUNTER — Ambulatory Visit: Payer: BLUE CROSS/BLUE SHIELD

## 2015-09-15 LAB — SURGICAL PATHOLOGY

## 2015-09-16 ENCOUNTER — Other Ambulatory Visit: Payer: Self-pay

## 2015-09-16 ENCOUNTER — Ambulatory Visit (INDEPENDENT_AMBULATORY_CARE_PROVIDER_SITE_OTHER): Payer: BLUE CROSS/BLUE SHIELD | Admitting: General Surgery

## 2015-09-16 ENCOUNTER — Encounter: Payer: Self-pay | Admitting: General Surgery

## 2015-09-16 VITALS — BP 118/64 | HR 78 | Resp 14 | Ht 66.0 in | Wt 209.0 lb

## 2015-09-16 DIAGNOSIS — D0512 Intraductal carcinoma in situ of left breast: Secondary | ICD-10-CM | POA: Diagnosis not present

## 2015-09-16 DIAGNOSIS — R920 Mammographic microcalcification found on diagnostic imaging of breast: Secondary | ICD-10-CM

## 2015-09-16 DIAGNOSIS — Z853 Personal history of malignant neoplasm of breast: Secondary | ICD-10-CM | POA: Insufficient documentation

## 2015-09-16 HISTORY — PX: BREAST BIOPSY: SHX20

## 2015-09-16 NOTE — Progress Notes (Signed)
Patient ID: Sonya Lopez, female   DOB: 05-28-1955, 60 y.o.   MRN: [id number]  Chief Complaint  Patient presents with  . Other    discussion     HPI Sonya Lopez is a 60 y.o. female here today for a breast discussion post left breast biopsy. She is here today with her husband, Sonya Lopez. She had been notified by phone yesterday results of her biopsy.  HPI  Past Medical History:  Diagnosis Date  . Allergy   . Anxiety    situational anxiety  . Arrhythmia    A-fib/A-flutter  . Depression   . Difficult intubation 2009   [place] had to use  'boogee"  . Female cystocele 01/05/2011  . Fibromyalgia   . GERD (gastroesophageal reflux disease)   . Headache(784.0)   . Obesity   . PONV (postoperative nausea and vomiting)    h/o difficult intubation and [place] had to use "boogee"    Past Surgical History:  Procedure Laterality Date  . ANTERIOR AND POSTERIOR REPAIR  11/30/2011   Procedure: ANTERIOR (CYSTOCELE) AND POSTERIOR REPAIR (RECTOCELE);  Surgeon: Reece Packer, MD;  Location: [facility];  Service: Urology;  Laterality: N/A;  with cysto  . BLADDER SUSPENSION  11/30/2011   Procedure: [facility] PROCEDURE;  Surgeon: Reece Packer, MD;  Location: [facility];  Service: Urology;  Laterality: N/A;  graft 10x6  . BREAST BIOPSY Right 2007   negative core  . CESAREAN SECTION     x 1  . COLONOSCOPY  2009  . CYSTOSCOPY  11/30/2011   Procedure: CYSTOSCOPY;  Surgeon: Reece Packer, MD;  Location: [facility];  Service: Urology;;  . Brigitte Pulse AND CURETTAGE OF UTERUS  05/05/2007  . GALLBLADDER SURGERY  2004  . TONSILLECTOMY  1961  . VAGINAL HYSTERECTOMY  11/30/2011   Procedure: HYSTERECTOMY VAGINAL;  Surgeon: Emily Filbert, MD;  Location: [facility];  Service: Gynecology;  Laterality: N/A;    Family History  Problem Relation Age of Onset  . Diabetes Mother   . Hearing loss Mother   . Hypertension Mother   . Heart disease Father   . Heart disease Paternal  Grandfather   . Asthma Daughter   . Asthma Daughter   . Migraines Daughter   . Fibromyalgia Daughter   . Interstitial cystitis Daughter   . Asthma Daughter   . Colitis Daughter   . Breast cancer Neg Hx     Social History Social History  Substance Use Topics  . Smoking status: Never Smoker  . Smokeless tobacco: Never Used  . Alcohol use 0.0 oz/week     Comment: rare    Allergies  Allergen Reactions  . Adhesive [Tape] Dermatitis  . Levaquin  [Levofloxacin]   . Macrodantin [Nitrofurantoin] Nausea And Vomiting  . Metronidazole Nausea Only  . Nitrofurantoin Macrocrystal Nausea And Vomiting  . Sulfamethoxazole-Trimethoprim   . Cephalexin Rash  . Clindamycin Hcl Rash  . Doxycycline Swelling and Rash  . Erythromycin Rash    Current Outpatient Prescriptions  Medication Sig Dispense Refill  . amiodarone (PACERONE) 200 MG tablet Take 1 tablet (200 mg total) by mouth 2 (two) times daily as needed. (Patient taking differently: Take 200 mg by mouth daily. ) 60 tablet 6  . aspirin EC 81 MG tablet Take 81 mg by mouth daily.    . Azelaic Acid (FINACEA) 15 % cream Apply 1 application topically 2 (two) times daily as needed. For rosacea    . Calcium Carbonate-Vitamin D (CALTRATE  600+D) 600-400 MG-UNIT per tablet Take 1 tablet by mouth 2 (two) times daily.    . Clindamycin Phosphate foam   5  . DULoxetine (CYMBALTA) 60 MG capsule Take 1 capsule (60 mg total) by mouth daily. 30 capsule 2  . gabapentin (NEURONTIN) 100 MG capsule Take 1 capsule (100 mg total) by mouth 2 (two) times daily. 60 capsule 5  . magnesium oxide (MAG-OX) 400 MG tablet Take 400 mg by mouth daily.    . metaxalone (SKELAXIN) 800 MG tablet Take 1 tablet (800 mg total) by mouth at bedtime as needed for muscle spasms. 90 tablet 5  . metoprolol tartrate (LOPRESSOR) 25 MG tablet Take 25 mg by mouth as needed.    . mometasone (NASONEX) 50 MCG/ACT nasal spray Place 2 sprays into the nose at bedtime.    . naratriptan (AMERGE)  2.5 MG tablet Take 1 tablet (2.5 mg total) by mouth as needed. 9 tablet 5  . Probiotic Product (PROBIOTIC PO) Take 1 capsule by mouth daily.     . propranolol (INDERAL) 10 MG tablet Take 1 tablet (10 mg total) by mouth 3 (three) times daily as needed (palpitations). 90 tablet 4  . ranitidine (ZANTAC) 150 MG capsule Take 150 mg by mouth as needed for heartburn.    . tamsulosin (FLOMAX) 0.4 MG CAPS capsule Take 0.8 mg by mouth 2 (two) times daily.     No current facility-administered medications for this visit.     Review of Systems Review of Systems  Constitutional: Negative.   Respiratory: Negative.     Blood pressure 118/64, pulse 78, resp. rate 14, height 5\' 6"  (1.676 m), weight 209 lb (94.8 kg).  Physical Exam Physical Exam  Constitutional: She is oriented to person, place, and time. She appears well-developed and well-nourished.  Pulmonary/Chest:    Neurological: She is alert and oriented to person, place, and time.  Skin: Skin is warm and dry.  Psychiatric: Her behavior is normal.    Data Reviewed DIAGNOSIS:  A. LEFT BREAST, LOWER OUTER QUADRANT; BIOPSY:  - DUCTAL CARCINOMA IN SITU, HIGH-GRADE, COMEDO TYPE WITH ASSOCIATED  MICROCALCIFICATIONS.   Note: DCIS involves 3 of 4 tissue blocks and measures 5 mm in greatest  linear dimension. Ancillary testing is deferred to an excision specimen.  Results were called to [facility] in the radiology department at 1 PM on  09/15/15. DIAGNOSIS:     Ultrasound examination of the left breast was undertaken determine if the biopsy site would be visible. There is significant architectural distortion related to the recent biopsy. A focal area measuring 0.5 x 0.5 x 0.64 cm is noted in the 3:00 position 6 m from the nipple. This may correlate with the recently completed biopsy but is not felt conclusive. The patient will be best served by preoperative wire localization. BI-RADS-6.  Assessment    High-grade DCIS involving the left breast, 5 mm  in diameter encore biopsy, likely larger Based on mammographic imaging.  Location does not necessitate sentinel node biopsy, as it is unlikely that wide excision would affect later sentinel node dissection if needed.   Plan    The patient has a well-defined lesion in the D cup breast. She'll be an excellent candidate for breast conservation. Indication for post surgery radiation therapy was reviewed. ER/PR testing has been deferred pending formal excision and this will determine recommendations regarding adjuvant anti-hormonal therapy. The majority of the 45 minute visit was spent reviewing management of DCIS. Website information provided.   [name] husband  is scheduled for a cardiac stress test tomorrow. If this is negative she'll contact the office and arrange scheduling a convenient date. Follow up otherwise after her husband cardiac status is clear.     This information has been scribed by Karie Fetch RN, BSN,BC.   Robert Bellow 09/16/2015, 6:34 PM

## 2015-09-17 ENCOUNTER — Other Ambulatory Visit: Payer: Self-pay

## 2015-09-17 DIAGNOSIS — D0512 Intraductal carcinoma in situ of left breast: Secondary | ICD-10-CM

## 2015-09-18 NOTE — Progress Notes (Signed)
  Oncology Nurse Navigator Documentation  Navigator Location: [facility] (09/18/15 1100) Navigator Encounter Type: Introductory phone call (09/18/15 1100)   Abnormal Finding Date: 09/01/15 (09/18/15 1100) Confirmed Diagnosis Date: 09/15/15 (09/18/15 1100)     Patient Visit Type: Initial (09/18/15 1100) Treatment Phase: Pre-Tx/Tx Discussion (09/18/15 1100) Barriers/Navigation Needs: Coordination of Care;Education (09/18/15 1100) Education: Understanding Cancer/ Treatment Options;Coping with Diagnosis/ Prognosis;Newly Diagnosed Cancer Education (09/18/15 1100) Interventions: Coordination of Care;Other;Talk to a survivor (09/18/15 1100)   Coordination of Care: Appts (09/18/15 1100)    Support Groups/Services: Breast Support Group (09/18/15 1100)   Acuity: Level 2 (09/18/15 1100)   Acuity Level 2: Initial guidance, education and coordination as needed;Educational needs;Assistance expediting appointments (09/18/15 1100)     Time Spent with Patient: 60 (09/18/15 1100)  Phoned patient to introduce Navigation service.  Patient expresses gratitude. She met with Dr. Bary Castilla, who has been her long-time surgeon, to discuss treatment plan. States she has multiple issues unrelated to her breast cancer that she is dealing with.  Her husband is awaiting cardiac clearance, prior to patient scheduling her surgery.  Her mother has dementia.  Patient states she has a close friend who attends our Support Group meetings.  Informed patient of upcoming meeting on 10/07/15 at [place] in [facility].  Fed-Exed Breast Cancer Treatment Handbook/folder with hospital services.  History/risk factors collected for case conference.

## 2015-09-19 ENCOUNTER — Other Ambulatory Visit: Payer: Self-pay | Admitting: General Surgery

## 2015-09-19 NOTE — H&P (Signed)
HPI Sonya Lopez is a 60 y.o. female here today for a breast discussion post left breast biopsy. She is here today with her husband, Earlywine Landry. She had been notified by phone yesterday results of her biopsy.  HPI      Past Medical History:  Diagnosis Date  . Allergy   . Anxiety    situational anxiety  . Arrhythmia    A-fib/A-flutter  . Depression   . Difficult intubation 2009   [place] had to use  'boogee"  . Female cystocele 01/05/2011  . Fibromyalgia   . GERD (gastroesophageal reflux disease)   . Headache(784.0)   . Obesity   . PONV (postoperative nausea and vomiting)    h/o difficult intubation and [place] had to use "boogee"         Past Surgical History:  Procedure Laterality Date  . ANTERIOR AND POSTERIOR REPAIR  11/30/2011   Procedure: ANTERIOR (CYSTOCELE) AND POSTERIOR REPAIR (RECTOCELE);  Surgeon: Reece Packer, MD;  Location: [facility];  Service: Urology;  Laterality: N/A;  with cysto  . BLADDER SUSPENSION  11/30/2011   Procedure: [facility] PROCEDURE;  Surgeon: Reece Packer, MD;  Location: [place] [facility];  Service: Urology;  Laterality: N/A;  graft 10x6  . BREAST BIOPSY Right 2007   negative core  . CESAREAN SECTION     x 1  . COLONOSCOPY  2009  . CYSTOSCOPY  11/30/2011   Procedure: CYSTOSCOPY;  Surgeon: Reece Packer, MD;  Location: [facility];  Service: Urology;;  . Brigitte Pulse AND CURETTAGE OF UTERUS  05/05/2007  . GALLBLADDER SURGERY  2004  . TONSILLECTOMY  1961  . VAGINAL HYSTERECTOMY  11/30/2011   Procedure: HYSTERECTOMY VAGINAL;  Surgeon: Emily Filbert, MD;  Location: [facility];  Service: Gynecology;  Laterality: N/A;         Family History  Problem Relation Age of Onset  . Diabetes Mother   . Hearing loss Mother   . Hypertension Mother   . Heart disease Father   . Heart disease Paternal Grandfather   . Asthma Daughter   . Asthma Daughter   . Migraines Daughter   . Fibromyalgia Daughter    . Interstitial cystitis Daughter   . Asthma Daughter   . Colitis Daughter   . Breast cancer Neg Hx     Social History        Social History   Substance Use Topics   . Smoking status: Never Smoker   . Smokeless tobacco: Never Used   . Alcohol use 0.0 oz/week     Comment: rare         Allergies  Allergen Reactions  . Adhesive [Tape] Dermatitis  . Levaquin  [Levofloxacin]   . Macrodantin [Nitrofurantoin] Nausea And Vomiting  . Metronidazole Nausea Only  . Nitrofurantoin Macrocrystal Nausea And Vomiting  . Sulfamethoxazole-Trimethoprim   . Cephalexin Rash  . Clindamycin Hcl Rash  . Doxycycline Swelling and Rash  . Erythromycin Rash          Current Outpatient Prescriptions  Medication Sig Dispense Refill  . amiodarone (PACERONE) 200 MG tablet Take 1 tablet (200 mg total) by mouth 2 (two) times daily as needed. (Patient taking differently: Take 200 mg by mouth daily. ) 60 tablet 6  . aspirin EC 81 MG tablet Take 81 mg by mouth daily.    . Azelaic Acid (FINACEA) 15 % cream Apply 1 application topically 2 (two) times daily as needed. For rosacea    . Calcium Carbonate-Vitamin  D (CALTRATE 600+D) 600-400 MG-UNIT per tablet Take 1 tablet by mouth 2 (two) times daily.    . Clindamycin Phosphate foam   5  . DULoxetine (CYMBALTA) 60 MG capsule Take 1 capsule (60 mg total) by mouth daily. 30 capsule 2  . gabapentin (NEURONTIN) 100 MG capsule Take 1 capsule (100 mg total) by mouth 2 (two) times daily. 60 capsule 5  . magnesium oxide (MAG-OX) 400 MG tablet Take 400 mg by mouth daily.    . metaxalone (SKELAXIN) 800 MG tablet Take 1 tablet (800 mg total) by mouth at bedtime as needed for muscle spasms. 90 tablet 5  . metoprolol tartrate (LOPRESSOR) 25 MG tablet Take 25 mg by mouth as needed.    . mometasone (NASONEX) 50 MCG/ACT nasal spray Place 2 sprays into the nose at bedtime.    . naratriptan (AMERGE) 2.5 MG tablet Take 1 tablet (2.5 mg total) by mouth  as needed. 9 tablet 5  . Probiotic Product (PROBIOTIC PO) Take 1 capsule by mouth daily.     . propranolol (INDERAL) 10 MG tablet Take 1 tablet (10 mg total) by mouth 3 (three) times daily as needed (palpitations). 90 tablet 4  . ranitidine (ZANTAC) 150 MG capsule Take 150 mg by mouth as needed for heartburn.    . tamsulosin (FLOMAX) 0.4 MG CAPS capsule Take 0.8 mg by mouth 2 (two) times daily.     No current facility-administered medications for this visit.     Review of Systems Review of Systems  Constitutional: Negative.   Respiratory: Negative.     Blood pressure 118/64, pulse 78, resp. rate 14, height 5\' 6"  (1.676 m), weight 209 lb (94.8 kg).  Physical Exam Physical Exam  Constitutional: She is oriented to person, place, and time. She appears well-developed and well-nourished.  Pulmonary/Chest:    Neurological: She is alert and oriented to person, place, and time.  Skin: Skin is warm and dry.  Psychiatric: Her behavior is normal.    Data Reviewed DIAGNOSIS:  A. LEFT BREAST, LOWER OUTER QUADRANT; BIOPSY:  - DUCTAL CARCINOMA IN SITU, HIGH-GRADE, COMEDO TYPE WITH ASSOCIATED  MICROCALCIFICATIONS.   Note: DCIS involves 3 of 4 tissue blocks and measures 5 mm in greatest  linear dimension. Ancillary testing is deferred to an excision specimen.  Results were called to [facility] in the radiology department at 1 PM on  09/15/15. DIAGNOSIS:     Ultrasound examination of the left breast was undertaken determine if the biopsy site would be visible. There is significant architectural distortion related to the recent biopsy. A focal area measuring 0.5 x 0.5 x 0.64 cm is noted in the 3:00 position 6 m from the nipple. This may correlate with the recently completed biopsy but is not felt conclusive. The patient will be best served by preoperative wire localization. BI-RADS-6.  Assessment    High-grade DCIS involving the left breast, 5 mm in diameter encore biopsy, likely  larger Based on mammographic imaging.  Location does not necessitate sentinel node biopsy, as it is unlikely that wide excision would affect later sentinel node dissection if needed.   Plan    The patient has a well-defined lesion in the D cup breast. She'll be an excellent candidate for breast conservation. Indication for post surgery radiation therapy was reviewed. ER/PR testing has been deferred pending formal excision and this will determine recommendations regarding adjuvant anti-hormonal therapy. The majority of the 45 minute visit was spent reviewing management of DCIS. Website information provided.   The  patient's husband is scheduled for a cardiac stress test tomorrow. If this is negative she'll contact the office and arrange scheduling a convenient date. Follow up otherwise after her husband cardiac status is clear.

## 2015-09-22 ENCOUNTER — Other Ambulatory Visit: Payer: Self-pay | Admitting: General Surgery

## 2015-09-22 DIAGNOSIS — D0512 Intraductal carcinoma in situ of left breast: Secondary | ICD-10-CM

## 2015-09-23 ENCOUNTER — Telehealth: Payer: Self-pay

## 2015-09-23 NOTE — Telephone Encounter (Signed)
The patient is scheduled for surgery at [facility] on 09/30/15. She will pre admit by phone. She is to arrive at the [facility] on 09/30/15 at 7:45 am. The patient is aware of date, time, and instructions.

## 2015-09-23 NOTE — Progress Notes (Signed)
  Oncology Nurse Navigator Documentation  Navigator Location: [facility] (09/23/15 1400) Navigator Encounter Type: Education;Telephone (09/23/15 1400) Telephone: Incoming Call;Outgoing Call (09/23/15 1400)     Surgery Date: 09/30/15 (09/23/15 1400)   Patient Visit Type: Other (Pre-surgery) (09/23/15 1400) Treatment Phase: Pre-Tx/Tx Discussion (09/23/15 1400) Barriers/Navigation Needs: Family concerns;Education (09/23/15 1400) Education: Understanding Cancer/ Treatment Options;Coping with Diagnosis/ Prognosis;Newly Diagnosed Cancer Education;Preparing for Upcoming Surgery/ Treatment (09/23/15 1400) Interventions: Education Method (09/23/15 1400)                      Time Spent with Patient: 30 (09/23/15 1400)   Answered patient questions about needle localization, follow-up, timing of radiation, and anti-hormonal therapy if needed.

## 2015-09-23 NOTE — Pre-Procedure Instructions (Signed)
Progress Notes Encounter Date: 08/25/2015 Sonya Merritts, MD  Cardiology  Expand All Collapse All   [] Hide copied text Cardiology Office Note  Date:  08/25/2015   ID:  Sonya Lopez, DOB May 19, 1955, MRN [id number]  PCP:  Loistine Chance, MD                 Chief Complaint  Patient presents with  . Other    6 month f/u. Meds reviewed verbally with pt.    HPI:  Ms. Harner is a very pleasant 60 year old woman with a history of diabetes, GERD, mild obesity with recent hospital admission in 04/16/2011 for atrial flutter with RVR. She presents for routine followup of her arrhythmia.   In follow-up today, she reports that she is doing very well Since starting amiodarone, she has had a dramatic improvement in her arrhythmia Very rare short episodes, nothing significant She is actually taking amiodarone 100 mg daily She's not had to take metoprolol or propranolol No significant shortness of breath, she has chronic mild nonpitting lower extremity edema worse in the summer  Recent lab work reviewed with her Total chole 200, LDL 132 Hemoglobin A1c in the 5 range  Father had significant history of coronary artery disease but was a heavy smoker  EKG on today's visit shows normal sinus rhythm with rate 61 bpm, no significant ST or T-wave changes  Other past medical history reviewed She details numerous episodes of atrial flutter at the end of 2016 Episode September 6, September 9, September 19, September 26, September 30, October 26, 12/08/2014, November 18, November 25, December 22 Typically episodes will last 1.5 up to 2 hours  Heart rate typically runs 110, sometimes 155 bpm, one episode 170 bpm Relatively symptomatic when she has episodes  She does report significant stress in the family, particularly over the summer 2016 She was started on Cymbalta with improvement of her symptoms. Feels less stressed  EKG on today's visit shows sinus rhythm with rate  62 bpm, no significant ST or T-wave changes  Previous episodes of atrial flutter in November 2015, January 2016 typically lasting less than 2 hours In the past she did not want to take a medication every day  She takes care of her mother who has significant medical issues. Significant stress at home. She does report having some chest pain in February and early March 2015 which seemed to resolve by starting a proton pump inhibitor. No further chest discomfort with biking or other exercise.  Previous Echocardiogram is essentially normal with normal systolic function, ejection fraction greater than 55%, no significant valvular disease for wall motion abnormality. Total cholesterol 196, LDL 121, HDL 59. Cholesterol is up from 160s since then she has been taking great   PMH:   has a past medical history of Allergy; Anxiety; Arrhythmia; Depression; Difficult intubation (2009); Female cystocele (01/05/2011); Fibromyalgia; GERD (gastroesophageal reflux disease); Headache(784.0); Obesity; and PONV (postoperative nausea and vomiting).  PSH:         Past Surgical History:  Procedure Laterality Date  . ANTERIOR AND POSTERIOR REPAIR  11/30/2011   Procedure: ANTERIOR (CYSTOCELE) AND POSTERIOR REPAIR (RECTOCELE);  Surgeon: Reece Packer, MD;  Location: [place] [facility];  Service: Urology;  Laterality: N/A;  with cysto  . BLADDER SUSPENSION  11/30/2011   Procedure: [facility] PROCEDURE;  Surgeon: Reece Packer, MD;  Location: [place] [facility];  Service: Urology;  Laterality: N/A;  graft 10x6  . BREAST BIOPSY Right 2007   negative core  . CESAREAN SECTION  x 1  . COLONOSCOPY  2009  . CYSTOSCOPY  11/30/2011   Procedure: CYSTOSCOPY;  Surgeon: Reece Packer, MD;  Location: [place] [facility];  Service: Urology;;  . Brigitte Pulse AND CURETTAGE OF UTERUS  05/05/2007  . GALLBLADDER SURGERY  2004  . TONSILLECTOMY  1961  . VAGINAL HYSTERECTOMY  11/30/2011   Procedure: HYSTERECTOMY VAGINAL;  Surgeon: Emily Filbert,  MD;  Location: [place] [facility];  Service: Gynecology;  Laterality: N/A;          Current Outpatient Prescriptions  Medication Sig Dispense Refill  . amiodarone (PACERONE) 200 MG tablet Take 1 tablet (200 mg total) by mouth 2 (two) times daily as needed. (Patient taking differently: Take 200 mg by mouth daily. ) 60 tablet 6  . aspirin EC 81 MG tablet Take 81 mg by mouth daily.    . Azelaic Acid (FINACEA) 15 % cream Apply 1 application topically 2 (two) times daily as needed. For rosacea    . Calcium Carbonate-Vitamin D (CALTRATE 600+D) 600-400 MG-UNIT per tablet Take 1 tablet by mouth 2 (two) times daily.    . Clindamycin Phosphate foam   5  . DULoxetine (CYMBALTA) 60 MG capsule Take 1 capsule (60 mg total) by mouth daily. 30 capsule 2  . gabapentin (NEURONTIN) 100 MG capsule Take 1 capsule (100 mg total) by mouth 2 (two) times daily. 60 capsule 5  . magnesium oxide (MAG-OX) 400 MG tablet Take 400 mg by mouth daily.    . metaxalone (SKELAXIN) 800 MG tablet Take 1 tablet (800 mg total) by mouth at bedtime as needed for muscle spasms. 90 tablet 5  . metoprolol tartrate (LOPRESSOR) 25 MG tablet Take 25 mg by mouth as needed.    . mometasone (NASONEX) 50 MCG/ACT nasal spray Place 2 sprays into the nose at bedtime.    . naratriptan (AMERGE) 2.5 MG tablet Take 1 tablet (2.5 mg total) by mouth as needed. 9 tablet 5  . Probiotic Product (PROBIOTIC PO) Take 1 capsule by mouth daily.     . propranolol (INDERAL) 10 MG tablet Take 1 tablet (10 mg total) by mouth 3 (three) times daily as needed (palpitations). 90 tablet 4  . ranitidine (ZANTAC) 150 MG capsule Take 150 mg by mouth as needed for heartburn.    . tamsulosin (FLOMAX) 0.4 MG CAPS capsule Take 0.8 mg by mouth 2 (two) times daily.     No current facility-administered medications for this visit.      Allergies:   Adhesive [tape]; Levaquin  [levofloxacin]; Macrodantin [nitrofurantoin]; Metronidazole; Nitrofurantoin macrocrystal;  Sulfamethoxazole-trimethoprim; Cephalexin; Clindamycin hcl; Doxycycline; and Erythromycin   Social History:  The patient  reports that she has never smoked. She has never used smokeless tobacco. She reports that she drinks alcohol. She reports that she does not use drugs.   Family History:   family history includes Asthma in her daughter, daughter, and daughter; Colitis in her daughter; Diabetes in her mother; Fibromyalgia in her daughter; Hearing loss in her mother; Heart disease in her father and paternal grandfather; Hypertension in her mother; Interstitial cystitis in her daughter; Migraines in her daughter.    Review of Systems: Review of Systems  Constitutional: Negative.   Respiratory: Negative.   Cardiovascular: Positive for palpitations.  Gastrointestinal: Negative.   Musculoskeletal: Negative.   Neurological: Negative.   Psychiatric/Behavioral: Negative.   All other systems reviewed and are negative.    PHYSICAL EXAM: VS:  BP 110/68   Pulse 61   Ht 5\' 6"  (1.676 m)  Wt 211 lb 4 oz (95.8 kg)   BMI 34.10 kg/m  , BMI Body mass index is 34.1 kg/m. GEN: Well nourished, well developed, in no acute distress  HEENT: normal  Neck: no JVD, carotid bruits, or masses Cardiac: RRR; no murmurs, rubs, or gallops,no edema  Respiratory:  clear to auscultation bilaterally, normal work of breathing GI: soft, nontender, nondistended, + BS MS: no deformity or atrophy  Skin: warm and dry, no rash Neuro:  Strength and sensation are intact Psych: euthymic mood, full affect    Recent Labs: 06/04/2015: ALT 14; BUN 16; Creatinine, Ser 0.90; Potassium 4.7; Sodium 142; TSH 3.980    Lipid Panel RecentLabs       Lab Results  Component Value Date   CHOL 209 (H) 06/04/2015   HDL 58 06/04/2015   LDLCALC 132 (H) 06/04/2015   TRIG 97 06/04/2015           Wt Readings from Last 3 Encounters:  08/25/15 211 lb 4 oz (95.8 kg)  06/02/15 208 lb 8 oz (94.6 kg)  03/04/15  205 lb 6.4 oz (93.2 kg)       ASSESSMENT AND PLAN:  Atrial flutter, unspecified type ([place]) - Plan: EKG 12-Lead  Minimal episodes of flutter as detailed above, no changes to her medications We did discuss that there is no strong indication for ablation at this time given lack of symptoms Currently not on anticoagulation given she is very symptomatic when she has episodes, minimal events  Hyperlipidemia Long discussion concerning cholesterol management , Recommended weight loss, no indication for cholesterol medication unless she prefers aggressive management We did discuss CT coronary calcium scoring, she will research this at home  Disposition:   F/U  12 months   Total encounter time more than 15 minutes  Greater than 50% was spent in counseling and coordination of care with the patient       Orders Placed This Encounter  Procedures  . EKG 12-Lead     Signed, Esmond Plants, M.D., Ph.D. 08/25/2015  [place], [place]        Office Visit on 08/25/2015        Detailed Report

## 2015-09-23 NOTE — Patient Instructions (Signed)
  Your procedure is scheduled on: 09-30-15 (TUESDAY) Report to [place] @ 7:45    Remember: Instructions that are not followed completely may result in serious medical risk, up to and including death, or upon the discretion of your surgeon and anesthesiologist your surgery may need to be rescheduled.    _x___ 1. Do not eat food or drink liquids after midnight. No gum chewing or hard candies.     __x__ 2. No Alcohol for 24 hours before or after surgery.   __x__3. No Smoking for 24 prior to surgery.   ____  4. Bring all medications with you on the day of surgery if instructed.    __x__ 5. Notify your doctor if there is any change in your medical condition     (cold, fever, infections).     Do not wear jewelry, make-up, hairpins, clips or nail polish.  Do not wear lotions, powders, or perfumes. You may wear deodorant.  Do not shave 48 hours prior to surgery. Men may shave face and neck.  Do not bring valuables to the hospital.    [facility] is not responsible for any belongings or valuables.               Contacts, dentures or bridgework may not be worn into surgery.  Leave your suitcase in the car. After surgery it may be brought to your room.  For patients admitted to the hospital, discharge time is determined by your treatment team.   Patients discharged the day of surgery will not be allowed to drive home.    Please read over the following fact sheets that you were given:   [facility] Preparing for Surgery and or MRSA Information   _x___ Take these medicines the morning of surgery with A SIP OF WATER:    1. MAGNESIUM OXIDE  2. FLOMAX (TAMSULOSIN)  3. ZANTAC  4. TAKE A ZANTAC ON Monday NIGHT BEFORE BED  5. (BRING METOPROLOL AND PROPRANOLOL TO HOSPITAL )  6.  ____ Fleet Enema (as directed)   _x___ Use CHG Soap or sage wipes as directed on instruction sheet   ____ Use inhalers on the day of surgery and bring to hospital day of surgery  ____ Stop metformin 2  days prior to surgery    ____ Take 1/2 of usual insulin dose the night before surgery and none on the morning of surgery.   ____ Stop aspirin or coumadin, or plavix-OK TO CONTINUE 81 MG ASPIRIN PER DR BYRNETT-DO NOT TAKE DAY OF SURGERY  ___ Stop Anti-inflammatories such as Advil, Aleve, Ibuprofen, Motrin, Naproxen,          Naprosyn, Goodies powders or aspirin products. Ok to take Tylenol.   ____ Stop supplements until after surgery.    ____ Bring C-Pap to the hospital.

## 2015-09-24 ENCOUNTER — Encounter
Admission: RE | Admit: 2015-09-24 | Discharge: 2015-09-24 | Disposition: A | Discharge status: Home / Self Care | Payer: BLUE CROSS/BLUE SHIELD | Source: Ambulatory Visit | Attending: General Surgery | Admitting: General Surgery

## 2015-09-24 ENCOUNTER — Encounter: Payer: Self-pay | Admitting: Family Medicine

## 2015-09-24 ENCOUNTER — Ambulatory Visit (INDEPENDENT_AMBULATORY_CARE_PROVIDER_SITE_OTHER): Payer: BLUE CROSS/BLUE SHIELD | Admitting: Family Medicine

## 2015-09-24 VITALS — BP 116/64 | HR 83 | Temp 98.7°F | Resp 18 | Ht 66.0 in | Wt 210.5 lb

## 2015-09-24 DIAGNOSIS — M7062 Trochanteric bursitis, left hip: Secondary | ICD-10-CM

## 2015-09-24 DIAGNOSIS — D0512 Intraductal carcinoma in situ of left breast: Secondary | ICD-10-CM | POA: Diagnosis not present

## 2015-09-24 DIAGNOSIS — Z23 Encounter for immunization: Secondary | ICD-10-CM

## 2015-09-24 DIAGNOSIS — Z01812 Encounter for preprocedural laboratory examination: Secondary | ICD-10-CM | POA: Diagnosis not present

## 2015-09-24 HISTORY — DX: Malignant (primary) neoplasm, unspecified: C80.1

## 2015-09-24 LAB — COMPREHENSIVE METABOLIC PANEL
ALT: 14 U/L (ref 14–54)
AST: 18 U/L (ref 15–41)
Albumin: 4.1 g/dL (ref 3.5–5.0)
Alkaline Phosphatase: 131 U/L — ABNORMAL HIGH (ref 38–126)
Anion gap: 5 (ref 5–15)
BUN: 18 mg/dL (ref 6–20)
CALCIUM: 9.3 mg/dL (ref 8.9–10.3)
CHLORIDE: 106 mmol/L (ref 101–111)
CO2: 29 mmol/L (ref 22–32)
CREATININE: 0.87 mg/dL (ref 0.44–1.00)
Glucose, Bld: 91 mg/dL (ref 65–99)
Potassium: 4.3 mmol/L (ref 3.5–5.1)
Sodium: 140 mmol/L (ref 135–145)
Total Bilirubin: 0.6 mg/dL (ref 0.3–1.2)
Total Protein: 7.7 g/dL (ref 6.5–8.1)

## 2015-09-24 MED ORDER — TRIAMCINOLONE ACETONIDE 40 MG/ML IJ SUSP (RADIOLOGY): 40.0000 mg | Freq: Once | INTRAMUSCULAR | Status: DC

## 2015-09-24 MED ORDER — LIDOCAINE HCL 1 % IJ SOLN: 10.0000 mL | Freq: Once | INTRAMUSCULAR | Status: DC

## 2015-09-24 NOTE — Progress Notes (Signed)
Name: Sonya Lopez   MRN: [id number]    DOB: Jul 17, 1955   Date:09/24/2015       Progress Note  Subjective  Chief Complaint  Chief Complaint  Patient presents with  . Hip Pain    bursitis left hip    HPI  Bursitis Left Hip: she states steroid injection helped in December, but symptoms is getting progressively worse. She has daily pain, described as aching and goes down to her mid thigh. Worse when laying on left side at night and going upstairs. No redness on the site. She is taking Flexeril prn at night to help her sleep   DCIS: diagnosed with breast cancer on 08/15 by Dr. Birdie Sons, biopsy was done at [facility], going for lumpectomy next week, feeling nervous, but confident and glad that is not invasive.   Patient Active Problem List   Diagnosis Date Noted  . DCIS (ductal carcinoma in situ) of breast 09/16/2015  . Abnormal mammogram with microcalcification 09/03/2015  . Long-term use of high-risk medication 02/25/2015  . Increased urinary frequency 08/01/2014  . Fibrocystic breast disease 07/29/2014  . Mild major depression (HCC) 07/29/2014  . Gastric reflux 07/29/2014  . Chronic interstitial cystitis 07/29/2014  . Calculus of kidney 07/29/2014  . Allergic rhinitis 07/29/2014  . Acne erythematosa 07/29/2014  . Lumbosacral radiculitis 07/29/2014  . Tinnitus 07/29/2014  . Mixed incontinence, urge and stress (female) (female) 08/23/2013  . Hyperlipidemia 04/25/2013  . H/O: hysterectomy 11/30/2011  . Atrial flutter (HCC) 05/03/2011  . Diet-controlled type 2 diabetes mellitus (HCC) 04/16/2011  . Bladder cystocele 01/05/2011  . Migraine without aura and without status migrainosus, not intractable 12/29/2010  . Fibromyalgia 12/29/2010  . Obesity 12/29/2010    Past Surgical History:  Procedure Laterality Date  . ANTERIOR AND POSTERIOR REPAIR  11/30/2011   Procedure: ANTERIOR (CYSTOCELE) AND POSTERIOR REPAIR (RECTOCELE);  Surgeon: Martina Sinner, MD;  Location: [facility];   Service: Urology;  Laterality: N/A;  with cysto  . BLADDER SUSPENSION  11/30/2011   Procedure: [facility] PROCEDURE;  Surgeon: Martina Sinner, MD;  Location: [facility];  Service: Urology;  Laterality: N/A;  graft 10x6  . BREAST BIOPSY Right 2007   negative core  . CESAREAN SECTION     x 1  . COLONOSCOPY  2009  . CYSTOSCOPY  11/30/2011   Procedure: CYSTOSCOPY;  Surgeon: Martina Sinner, MD;  Location: [facility];  Service: Urology;;  . Joya Gaskins AND CURETTAGE OF UTERUS  05/05/2007  . GALLBLADDER SURGERY  2004  . TONSILLECTOMY  1961  . VAGINAL HYSTERECTOMY  11/30/2011   Procedure: HYSTERECTOMY VAGINAL;  Surgeon: Allie Bossier, MD;  Location: [facility];  Service: Gynecology;  Laterality: N/A;    Family History  Problem Relation Age of Onset  . Diabetes Mother   . Hearing loss Mother   . Hypertension Mother   . Heart disease Father   . Heart disease Paternal Grandfather   . Asthma Daughter   . Asthma Daughter   . Migraines Daughter   . Fibromyalgia Daughter   . Interstitial cystitis Daughter   . Asthma Daughter   . Colitis Daughter   . Breast cancer Neg Hx     Social History   Social History  . Marital status: Married    Spouse name: N/A  . Number of children: N/A  . Years of education: N/A   Occupational History  . Not on file.   Social History Main Topics  . Smoking status: Never Smoker  . Smokeless  tobacco: Never Used  . Alcohol use 0.0 oz/week     Comment: rare  . Drug use: No  . Sexual activity: Yes    Partners: Male   Other Topics Concern  . Not on file   Social History Narrative  . No narrative on file     Current Outpatient Prescriptions:  .  amiodarone (PACERONE) 200 MG tablet, Take 1 tablet (200 mg total) by mouth 2 (two) times daily as needed. (Patient taking differently: Take 100 mg by mouth at bedtime. ), Disp: 60 tablet, Rfl: 6 .  aspirin EC 81 MG tablet, Take 81 mg by mouth daily., Disp: , Rfl:  .  Azelaic Acid (FINACEA) 15 % cream, Apply 1 application  topically 2 (two) times daily as needed. For rosacea, Disp: , Rfl:  .  Calcium Carbonate-Vitamin D (CALTRATE 600+D) 600-400 MG-UNIT per tablet, Take 1 tablet by mouth daily. , Disp: , Rfl:  .  Clindamycin Phosphate foam, as needed. FOR BACK, Disp: , Rfl: 5 .  DULoxetine (CYMBALTA) 60 MG capsule, Take 1 capsule (60 mg total) by mouth daily. (Patient taking differently: Take 30-60 mg by mouth at bedtime. ), Disp: 30 capsule, Rfl: 2 .  gabapentin (NEURONTIN) 100 MG capsule, Take 1 capsule (100 mg total) by mouth 2 (two) times daily. (Patient taking differently: Take 100 mg by mouth at bedtime. ), Disp: 60 capsule, Rfl: 5 .  magnesium oxide (MAG-OX) 400 MG tablet, Take 400 mg by mouth every morning. , Disp: , Rfl:  .  metaxalone (SKELAXIN) 800 MG tablet, Take 1 tablet (800 mg total) by mouth at bedtime as needed for muscle spasms., Disp: 90 tablet, Rfl: 5 .  metoprolol tartrate (LOPRESSOR) 25 MG tablet, Take 25 mg by mouth as needed., Disp: , Rfl:  .  mometasone (NASONEX) 50 MCG/ACT nasal spray, Place 2 sprays into the nose every morning. , Disp: , Rfl:  .  naratriptan (AMERGE) 2.5 MG tablet, Take 1 tablet (2.5 mg total) by mouth as needed. (Patient taking differently: Take 2.5 mg by mouth as needed for migraine. ), Disp: 9 tablet, Rfl: 5 .  Probiotic Product (PROBIOTIC PO), Take 1 capsule by mouth daily. , Disp: , Rfl:  .  propranolol (INDERAL) 10 MG tablet, Take 1 tablet (10 mg total) by mouth 3 (three) times daily as needed (palpitations)., Disp: 90 tablet, Rfl: 4 .  ranitidine (ZANTAC) 150 MG capsule, Take 150 mg by mouth as needed for heartburn., Disp: , Rfl:  .  tamsulosin (FLOMAX) 0.4 MG CAPS capsule, Take 0.8 mg by mouth daily after breakfast. , Disp: , Rfl:   Current Facility-Administered Medications:  .  lidocaine (XYLOCAINE) 1 % (with pres) injection 10 mL, 10 mL, Intradermal, Once, Steele Sizer, MD .  triamcinolone acetonide (KENALOG-40) injection (RADIOLOGY ONLY) 40 mg, 40 mg,  Intra-articular, Once, Steele Sizer, MD  Allergies  Allergen Reactions  . Adhesive [Tape] Dermatitis    SKIN TURNS RED-PAPER TAPE OK TO USE  . Levaquin  [Levofloxacin]   . Macrodantin [Nitrofurantoin] Nausea And Vomiting  . Metronidazole Nausea Only  . Nitrofurantoin Macrocrystal Nausea And Vomiting  . Sulfamethoxazole-Trimethoprim   . Cephalexin Rash  . Clindamycin Hcl Rash  . Doxycycline Swelling and Rash  . Erythromycin Rash     ROS  Ten systems reviewed and is negative except as mentioned in HPI   Objective  Vitals:   09/24/15 1408  BP: 116/64  Pulse: 83  Resp: 18  Temp: 98.7 F (37.1 C)  SpO2: 94%  Weight: 210 lb 8 oz (95.5 kg)  Height: _0  (1.676 m)    Body mass index is 33.98 kg/m.  Physical Exam  Constitutional: Patient appears well-developed and well-nourished. Obese  No distress.  HEENT: head atraumatic, normocephalic, pupils equal and reactive to light,  neck supple, throat within normal limits Cardiovascular: Normal rate, regular rhythm and normal heart sounds.  No murmur heard. No BLE edema. Pulmonary/Chest: Effort normal and breath sounds normal. No respiratory distress. Abdominal: Soft.  There is no tenderness. Psychiatric: Patient has a normal mood and affect. behavior is normal. Judgment and thought content normal. Muscular Skeletal: pain during palpation of left trochanteric bursa, no redness, normal back exam  Recent Results (from the past 2160 hour(s))  Surgical pathology     Status: None   Collection Time: 09/12/15 12:04 PM  Result Value Ref Range   SURGICAL PATHOLOGY      Surgical Pathology CASE: [id number] PATIENT: Era Bumpers Surgical Pathology Report     SPECIMEN SUBMITTED: A. Breast, lower outer quadrant, left  CLINICAL HISTORY: Indeterminate calcifications  PRE-OPERATIVE DIAGNOSIS: ?DCIS  POST-OPERATIVE DIAGNOSIS: None provided.     DIAGNOSIS: A. LEFT BREAST, LOWER OUTER QUADRANT; BIOPSY: - DUCTAL  CARCINOMA IN SITU, HIGH-GRADE, COMEDO TYPE WITH ASSOCIATED MICROCALCIFICATIONS.  Note: DCIS involves 3 of 4 tissue blocks and measures 5 mm in greatest linear dimension. Ancillary testing is deferred to an excision specimen. Results were called to [facility] in the radiology department at 1 PM on 09/15/15.   GROSS DESCRIPTION:  A. The specimen is received in a formalin-filled Coretainer device labeled with the patient's name and left LO Quad.  Core pieces: multiple Measurement: aggregate, 2.0 x 1.5 x 0.2 cm Comments: yellow lobulated fibrofatty, marked blue Accompanying post- surgical radiograph: yes, calcific ations in all sections  Entirely submitted in cassette(s):  1-section 12 2-section 3 3-section 6 4-section 9 5-central unmarked section  Time/Date in fixative: collected at 1105 and placed in formalin at 11:55 AM on 09/12/2015 Total fixation time: 8 hours Final Diagnosis performed by Delorse Lek, MD.  Electronically signed 09/15/2015 1:09:22PM    The electronic signature indicates that the named Attending Pathologist has evaluated the specimen  Technical component performed at [facility], [address] [place], [place] [address] Lab: [id number] Dir: Darrick Penna. Evette Doffing, MD  Professional component performed at [facility], [facility], [place], [place], [place] [address] Lab: [phone] Dir: Dellia Nims. Rubinas, MD    Comprehensive metabolic panel     Status: Abnormal   Collection Time: 09/24/15  9:30 AM  Result Value Ref Range   Sodium 140 135 - 145 mmol/L   Potassium 4.3 3.5 - 5.1 mmol/L   Chloride 106 101 - 111 mmol/L   CO2 29 22 - 32 mmol/L   Glucose, Bld 91 65 - 99 mg/dL   BUN 18 6 - 20 mg/dL   Creatinine, Ser 0.87 0.44 - 1.00 mg/dL   Calcium 9.3 8.9 - 10.3 mg/dL   Total Protein 7.7 6.5 - 8.1 g/dL   Albumin 4.1 3.5 - 5.0 g/dL   AST 18 15 - 41 U/L   ALT 14 14 - 54 U/L   Alkaline Phosphatase 131 (H) 38 - 126 U/L   Total Bilirubin 0.6  0.3 - 1.2 mg/dL   GFR calc non Af Amer >60 >60 mL/min   GFR calc Af Amer >60 >60 mL/min    Comment: (NOTE) The eGFR has been calculated using the CKD EPI equation. This calculation has not been validated in  all clinical situations. eGFR's persistently <60 mL/min signify possible Chronic Kidney Disease.    Anion gap 5 5 - 15     PHQ2/9: Depression screen [facility] 2/9 09/24/2015 06/02/2015 03/04/2015 01/31/2015 08/01/2014  Decreased Interest 0 0 0 0 0  Down, Depressed, Hopeless 0 0 0 0 0  PHQ - 2 Score 0 0 0 0 0    Fall Risk: Fall Risk  09/24/2015 06/02/2015 03/04/2015 01/31/2015 08/01/2014  Falls in the past year? _0     Functional Status Survey: Is the patient deaf or have difficulty hearing?: No Does the patient have difficulty seeing, even when wearing glasses/contacts?: No Does the patient have difficulty concentrating, remembering, or making decisions?: No Does the patient have difficulty walking or climbing stairs?: No Does the patient have difficulty dressing or bathing?: No Does the patient have difficulty doing errands alone such as visiting a doctor's office or shopping?: No    Assessment & Plan  1. DCIS (ductal carcinoma in situ) of breast, left  Continue follow up with Dr. Fleet Contras and will Dr. Massie Maroon after lumpectomy   2. Trochanteric bursitis of left hip  - triamcinolone acetonide (KENALOG-40) injection (RADIOLOGY ONLY) 40 mg; Inject 1 mL (40 mg total) into the articular space once. - lidocaine (XYLOCAINE) 1 % (with pres) injection 10 mL; Inject 10 mLs into the skin once.  Consent given verbally  Localized bursa  Area prepped with alcohol  Injection with lidocaine 1% and Kenalog 80m/1 ml on bursa sack -left trochanteric  Patient tolerated procedure well No side effects  3. Need for pneumococcal vaccination  - Pneumococcal conjugate vaccine 13-valent IM

## 2015-09-25 ENCOUNTER — Encounter: Payer: Self-pay | Admitting: General Surgery

## 2015-09-30 ENCOUNTER — Ambulatory Visit
Admission: RE | Admit: 2015-09-30 | Discharge: 2015-09-30 | Disposition: A | Discharge status: Home / Self Care | Payer: BLUE CROSS/BLUE SHIELD | Source: Ambulatory Visit | Attending: General Surgery | Admitting: General Surgery

## 2015-09-30 ENCOUNTER — Ambulatory Visit: Payer: BLUE CROSS/BLUE SHIELD | Admitting: Anesthesiology

## 2015-09-30 ENCOUNTER — Encounter: Payer: Self-pay | Admitting: *Deleted

## 2015-09-30 ENCOUNTER — Encounter
Admission: RE | Disposition: A | Discharge status: Home / Self Care | Payer: Self-pay | Source: Ambulatory Visit | Attending: General Surgery

## 2015-09-30 DIAGNOSIS — F329 Major depressive disorder, single episode, unspecified: Secondary | ICD-10-CM | POA: Insufficient documentation

## 2015-09-30 DIAGNOSIS — D0512 Intraductal carcinoma in situ of left breast: Secondary | ICD-10-CM

## 2015-09-30 DIAGNOSIS — Z833 Family history of diabetes mellitus: Secondary | ICD-10-CM | POA: Insufficient documentation

## 2015-09-30 DIAGNOSIS — K219 Gastro-esophageal reflux disease without esophagitis: Secondary | ICD-10-CM | POA: Diagnosis not present

## 2015-09-30 DIAGNOSIS — Z8249 Family history of ischemic heart disease and other diseases of the circulatory system: Secondary | ICD-10-CM | POA: Insufficient documentation

## 2015-09-30 DIAGNOSIS — Z888 Allergy status to other drugs, medicaments and biological substances status: Secondary | ICD-10-CM | POA: Diagnosis not present

## 2015-09-30 DIAGNOSIS — N6002 Solitary cyst of left breast: Secondary | ICD-10-CM | POA: Diagnosis not present

## 2015-09-30 DIAGNOSIS — I4891 Unspecified atrial fibrillation: Secondary | ICD-10-CM | POA: Insufficient documentation

## 2015-09-30 DIAGNOSIS — D242 Benign neoplasm of left breast: Secondary | ICD-10-CM | POA: Insufficient documentation

## 2015-09-30 DIAGNOSIS — Z91048 Other nonmedicinal substance allergy status: Secondary | ICD-10-CM | POA: Diagnosis not present

## 2015-09-30 DIAGNOSIS — F419 Anxiety disorder, unspecified: Secondary | ICD-10-CM | POA: Insufficient documentation

## 2015-09-30 DIAGNOSIS — Z825 Family history of asthma and other chronic lower respiratory diseases: Secondary | ICD-10-CM | POA: Diagnosis not present

## 2015-09-30 DIAGNOSIS — Z7982 Long term (current) use of aspirin: Secondary | ICD-10-CM | POA: Insufficient documentation

## 2015-09-30 DIAGNOSIS — Z79899 Other long term (current) drug therapy: Secondary | ICD-10-CM | POA: Diagnosis not present

## 2015-09-30 DIAGNOSIS — Z881 Allergy status to other antibiotic agents status: Secondary | ICD-10-CM | POA: Diagnosis not present

## 2015-09-30 DIAGNOSIS — Z8379 Family history of other diseases of the digestive system: Secondary | ICD-10-CM | POA: Insufficient documentation

## 2015-09-30 DIAGNOSIS — Z882 Allergy status to sulfonamides status: Secondary | ICD-10-CM | POA: Diagnosis not present

## 2015-09-30 DIAGNOSIS — I4892 Unspecified atrial flutter: Secondary | ICD-10-CM | POA: Insufficient documentation

## 2015-09-30 DIAGNOSIS — R51 Headache: Secondary | ICD-10-CM | POA: Insufficient documentation

## 2015-09-30 DIAGNOSIS — E669 Obesity, unspecified: Secondary | ICD-10-CM | POA: Insufficient documentation

## 2015-09-30 DIAGNOSIS — C50412 Malignant neoplasm of upper-outer quadrant of left female breast: Secondary | ICD-10-CM | POA: Diagnosis not present

## 2015-09-30 HISTORY — DX: Unspecified atrial flutter: I48.92

## 2015-09-30 HISTORY — PX: BREAST LUMPECTOMY: SHX2

## 2015-09-30 HISTORY — PX: BREAST LUMPECTOMY WITH NEEDLE LOCALIZATION: SHX5759

## 2015-09-30 HISTORY — DX: Calculus of kidney: N20.0

## 2015-09-30 SURGERY — BREAST LUMPECTOMY WITH NEEDLE LOCALIZATION
Anesthesia: General | Laterality: Left | Wound class: Clean

## 2015-09-30 MED ORDER — DEXAMETHASONE SODIUM PHOSPHATE 10 MG/ML IJ SOLN
INTRAMUSCULAR | Status: DC | PRN
  Administered 2015-09-30: 5 mg via INTRAVENOUS

## 2015-09-30 MED ORDER — HYDROCODONE-ACETAMINOPHEN 5-325 MG PO TABS
ORAL_TABLET | ORAL | Status: AC
  Administered 2015-09-30: 1 via ORAL
  Filled 2015-09-30: qty 1

## 2015-09-30 MED ORDER — ONDANSETRON HCL 4 MG/2ML IJ SOLN
INTRAMUSCULAR | Status: DC | PRN
  Administered 2015-09-30: 4 mg via INTRAVENOUS

## 2015-09-30 MED ORDER — LACTATED RINGERS IV SOLN
INTRAVENOUS | Status: DC
  Administered 2015-09-30 (×2): via INTRAVENOUS

## 2015-09-30 MED ORDER — HYDROCODONE-ACETAMINOPHEN 5-325 MG PO TABS
1.0000 | ORAL_TABLET | Freq: Once | ORAL | Status: AC
  Administered 2015-09-30: 1 via ORAL

## 2015-09-30 MED ORDER — SCOPOLAMINE 1 MG/3DAYS TD PT72
MEDICATED_PATCH | TRANSDERMAL | Status: AC
  Filled 2015-09-30: qty 1

## 2015-09-30 MED ORDER — HYDROCODONE-ACETAMINOPHEN 5-325 MG PO TABS: 1.0000 | ORAL_TABLET | ORAL | 0 refills | Status: DC | PRN

## 2015-09-30 MED ORDER — PROPOFOL 10 MG/ML IV BOLUS
INTRAVENOUS | Status: DC | PRN
  Administered 2015-09-30: 200 mg via INTRAVENOUS

## 2015-09-30 MED ORDER — FENTANYL CITRATE (PF) 100 MCG/2ML IJ SOLN
INTRAMUSCULAR | Status: DC | PRN
  Administered 2015-09-30: 50 ug via INTRAVENOUS

## 2015-09-30 MED ORDER — LIDOCAINE HCL (CARDIAC) 20 MG/ML IV SOLN
INTRAVENOUS | Status: DC | PRN
  Administered 2015-09-30: 80 mg via INTRAVENOUS

## 2015-09-30 MED ORDER — FENTANYL CITRATE (PF) 100 MCG/2ML IJ SOLN
INTRAMUSCULAR | Status: AC
  Administered 2015-09-30: 25 ug via INTRAVENOUS
  Filled 2015-09-30: qty 2

## 2015-09-30 MED ORDER — BUPIVACAINE-EPINEPHRINE (PF) 0.5% -1:200000 IJ SOLN
INTRAMUSCULAR | Status: DC | PRN
  Administered 2015-09-30: 30 mL

## 2015-09-30 MED ORDER — BUPIVACAINE-EPINEPHRINE (PF) 0.5% -1:200000 IJ SOLN
INTRAMUSCULAR | Status: AC
  Filled 2015-09-30: qty 30

## 2015-09-30 MED ORDER — AMIODARONE HCL 200 MG PO TABS: 100.0000 mg | ORAL_TABLET | Freq: Every day | ORAL | 0 refills | Status: DC

## 2015-09-30 MED ORDER — SCOPOLAMINE 1 MG/3DAYS TD PT72
1.0000 | MEDICATED_PATCH | TRANSDERMAL | Status: DC
  Administered 2015-09-30: 1.5 mg via TRANSDERMAL

## 2015-09-30 MED ORDER — ONDANSETRON HCL 4 MG/2ML IJ SOLN
4.0000 mg | Freq: Once | INTRAMUSCULAR | Status: AC | PRN
  Administered 2015-09-30: 4 mg via INTRAVENOUS

## 2015-09-30 MED ORDER — FENTANYL CITRATE (PF) 100 MCG/2ML IJ SOLN
25.0000 ug | INTRAMUSCULAR | Status: DC | PRN
  Administered 2015-09-30 (×2): 25 ug via INTRAVENOUS

## 2015-09-30 MED ORDER — EPHEDRINE SULFATE 50 MG/ML IJ SOLN
INTRAMUSCULAR | Status: DC | PRN
  Administered 2015-09-30: 50 mg via INTRAVENOUS
  Administered 2015-09-30: 5 mg via INTRAVENOUS

## 2015-09-30 SURGICAL SUPPLY — 51 items
BANDAGE ELASTIC 6 LF NS (GAUZE/BANDAGES/DRESSINGS) IMPLANT
BLADE SURG 15 STRL SS SAFETY (BLADE) ×4 IMPLANT
BNDG GAUZE 4.5X4.1 6PLY STRL (MISCELLANEOUS) IMPLANT
BRA SURGICAL LRG (MISCELLANEOUS) ×2 IMPLANT
BULB RESERV EVAC DRAIN JP 100C (MISCELLANEOUS) IMPLANT
CANISTER SUCT 1200ML W/VALVE (MISCELLANEOUS) ×2 IMPLANT
CHLORAPREP W/TINT 26ML (MISCELLANEOUS) ×2 IMPLANT
CNTNR SPEC 2.5X3XGRAD LEK (MISCELLANEOUS)
CONT SPEC 4OZ STER OR WHT (MISCELLANEOUS)
CONTAINER SPEC 2.5X3XGRAD LEK (MISCELLANEOUS) IMPLANT
COVER PROBE FLX POLY STRL (MISCELLANEOUS) ×2 IMPLANT
DEVICE DUBIN SPECIMEN MAMMOGRA (MISCELLANEOUS) ×2 IMPLANT
DRAIN CHANNEL JP 15F RND 16 (MISCELLANEOUS) IMPLANT
DRAPE LAPAROTOMY TRNSV 106X77 (MISCELLANEOUS) ×2 IMPLANT
DRSG TELFA 3X8 NADH (GAUZE/BANDAGES/DRESSINGS) ×2 IMPLANT
ELECT CAUTERY BLADE TIP 2.5 (TIP) ×2
ELECT REM PT RETURN 9FT ADLT (ELECTROSURGICAL) ×2
ELECTRODE CAUTERY BLDE TIP 2.5 (TIP) ×1 IMPLANT
ELECTRODE REM PT RTRN 9FT ADLT (ELECTROSURGICAL) ×1 IMPLANT
GAUZE FLUFF 18X24 1PLY STRL (GAUZE/BANDAGES/DRESSINGS) ×2 IMPLANT
GAUZE SPONGE 4X4 12PLY STRL (GAUZE/BANDAGES/DRESSINGS) IMPLANT
GLOVE BIO SURGEON STRL SZ7.5 (GLOVE) ×2 IMPLANT
GLOVE INDICATOR 8.0 STRL GRN (GLOVE) ×2 IMPLANT
GOWN STRL REUS W/ TWL LRG LVL3 (GOWN DISPOSABLE) ×2 IMPLANT
GOWN STRL REUS W/TWL LRG LVL3 (GOWN DISPOSABLE) ×2
HARMONIC SCALPEL FOCUS (MISCELLANEOUS) IMPLANT
KIT RM TURNOVER STRD PROC AR (KITS) ×2 IMPLANT
LABEL OR SOLS (LABEL) ×2 IMPLANT
MARGIN MAP 10MM (MISCELLANEOUS) ×2 IMPLANT
NDL SAFETY 22GX1.5 (NEEDLE) IMPLANT
NEEDLE HYPO 25X1 1.5 SAFETY (NEEDLE) ×4 IMPLANT
PACK BASIN MINOR ARMC (MISCELLANEOUS) ×2 IMPLANT
SHEARS FOC LG CVD HARMONIC 17C (MISCELLANEOUS) IMPLANT
SLEVE PROBE SENORX GAMMA FIND (MISCELLANEOUS) IMPLANT
STRIP CLOSURE SKIN 1/2X4 (GAUZE/BANDAGES/DRESSINGS) ×2 IMPLANT
SUT ETHILON 3-0 FS-10 30 BLK (SUTURE) ×2
SUT SILK 2 0 (SUTURE) ×1
SUT SILK 2-0 18XBRD TIE 12 (SUTURE) ×1 IMPLANT
SUT VIC AB 2-0 CT1 27 (SUTURE) ×2
SUT VIC AB 2-0 CT1 TAPERPNT 27 (SUTURE) ×2 IMPLANT
SUT VIC AB 3-0 SH 27 (SUTURE) ×2
SUT VIC AB 3-0 SH 27X BRD (SUTURE) ×2 IMPLANT
SUT VIC AB 4-0 FS2 27 (SUTURE) ×4 IMPLANT
SUT VICRYL+ 3-0 144IN (SUTURE) ×2 IMPLANT
SUTURE EHLN 3-0 FS-10 30 BLK (SUTURE) ×1 IMPLANT
SWABSTK COMLB BENZOIN TINCTURE (MISCELLANEOUS) ×2 IMPLANT
SYR BULB IRRIG 60ML STRL (SYRINGE) ×2 IMPLANT
SYR CONTROL 10ML (SYRINGE) ×2 IMPLANT
SYRINGE 10CC LL (SYRINGE) IMPLANT
TAPE TRANSPORE STRL 2 31045 (GAUZE/BANDAGES/DRESSINGS) IMPLANT
WATER STERILE IRR 1000ML POUR (IV SOLUTION) ×2 IMPLANT

## 2015-09-30 NOTE — Op Note (Signed)
Preoperative diagnosis: High-grade DCIS left breast.  Postoperative diagnosis: Same.  Operative procedure wide excision with needle localization.  Operating surgeon: Hervey Ard, M.D.  Anesthesia: Gen. by LMA,  Marcaine 0.5% with 1-200,000 units of epinephrine, 30 mL  Estimated blood loss: Less than 10 mL.  Clinical note this 60 year old woman had a change in her mammogram with a new area of dystrophic calcifications in the 3:00 position. Stereotactic biopsy showed high-grade DCIS. She desired breast conservation.  Operative note: With the patient under adequate general anesthesia and having undergone previous wire localization the breast was prepped with ChloraPrep and draped. Marcaine was infiltrated for postoperative analgesia. Ultrasound is used to confirm the location of the needle tip. A radial incision was made at 3:00 position. A block of tissue 3 x 3 x 5 cm in length was resected, orientated and sent for specimen radiograph. The entire wire was present. Previously placed clip present. Gross examination from pathology was negative. The specimen had a larger component removed medially as there appeared being hematoma cavity tracking in that direction, likely from the wire localization this morning. The deep tissue was approximated with interrupted 2-0 Vicryl sutures. The superficial adipose tissue was mobilized to allow approximation with interrupted 2-0 Vicryl sutures. The deeper tissue was approximated similar fashion. Skin was closed with a running 4-0 Vicryl subcuticular suture. Benzoin, Steri-Strips, Telfa, fluffed gauze and surgical bra applied.  The patient tolerated the procedure well and was taken recurrent sterile condition.

## 2015-09-30 NOTE — Discharge Instructions (Signed)

## 2015-09-30 NOTE — Anesthesia Preprocedure Evaluation (Signed)
Anesthesia Evaluation  Patient identified by MRN, date of birth, ID band Patient awake    Reviewed: Allergy & Precautions, H&P , NPO status , Patient's Chart, lab work & pertinent test results, reviewed documented beta blocker date and time   History of Anesthesia Complications (+) PONV, DIFFICULT AIRWAY and history of anesthetic complications  Airway Mallampati: IV  TM Distance: <3 FB Neck ROM: full    Dental  (+) Teeth Intact, Poor Dentition   Pulmonary neg pulmonary ROS,    Pulmonary exam normal        Cardiovascular Exercise Tolerance: Good negative cardio ROS Normal cardiovascular exam+ dysrhythmias Atrial Fibrillation  Rhythm:regular Rate:Normal     Neuro/Psych  Headaches, PSYCHIATRIC DISORDERS  Neuromuscular disease negative neurological ROS  negative psych ROS   GI/Hepatic negative GI ROS, Neg liver ROS, GERD  Medicated,  Endo/Other  negative endocrine ROSdiabetes, Well Controlled  Renal/GU Renal diseasenegative Renal ROS  negative genitourinary   Musculoskeletal   Abdominal   Peds  Hematology negative hematology ROS (+)   Anesthesia Other Findings   Reproductive/Obstetrics negative OB ROS                             Anesthesia Physical Anesthesia Plan  ASA: III  Anesthesia Plan: General LMA   Post-op Pain Management:    Induction:   Airway Management Planned:   Additional Equipment:   Intra-op Plan:   Post-operative Plan:   Informed Consent: I have reviewed the patients History and Physical, chart, labs and discussed the procedure including the risks, benefits and alternatives for the proposed anesthesia with the patient or authorized representative who has indicated his/her understanding and acceptance.     Plan Discussed with: CRNA  Anesthesia Plan Comments:         Anesthesia Quick Evaluation

## 2015-09-30 NOTE — H&P (Signed)
Healthy woman with high grade DCIS. For wide excision.

## 2015-09-30 NOTE — Transfer of Care (Signed)
Immediate Anesthesia Transfer of Care Note  Patient: Sonya Lopez  Procedure(s) Performed: Procedure(s): BREAST LUMPECTOMY WITH NEEDLE LOCALIZATION (Left)  Patient Location: PACU  Anesthesia Type:General  Level of Consciousness: awake, alert  and oriented  Airway & Oxygen Therapy: Patient Spontanous Breathing and Patient connected to face mask oxygen  Post-op Assessment: Report given to RN and Post -op Vital signs reviewed and stable  Post vital signs: Reviewed and stable  Last Vitals:  Vitals:   09/30/15 0918  BP: 126/61  Pulse: (!) 55  Resp: 16  Temp: 36.7 C    Last Pain:  Vitals:   09/30/15 0918  TempSrc: Oral  PainSc: 0-No pain         Complications: No apparent anesthesia complications

## 2015-09-30 NOTE — Anesthesia Procedure Notes (Signed)
Procedure Name: LMA Insertion Date/Time: 09/30/2015 11:37 AM Performed by: Zetta Bills Pre-anesthesia Checklist: Patient identified, Emergency Drugs available, Suction available and Patient being monitored Patient Re-evaluated:Patient Re-evaluated prior to inductionOxygen Delivery Method: Circle system utilized Preoxygenation: Pre-oxygenation with 100% oxygen Intubation Type: IV induction Ventilation: Mask ventilation without difficulty LMA: LMA inserted LMA Size: 4.0 Number of attempts: 1 Dental Injury: Teeth and Oropharynx as per pre-operative assessment  Difficulty Due To: Difficulty was anticipated and Difficult Airway- due to limited oral opening Future Recommendations: Recommend- induction with short-acting agent, and alternative techniques readily available

## 2015-09-30 NOTE — Progress Notes (Signed)
  Oncology Nurse Navigator Documentation  Navigator Location: [facility] (09/30/15 1000) Navigator Encounter Type: Education;Treatment (09/30/15 1000)       Surgery Date: 09/30/15 (09/30/15 1000)                      Specialty Items/DME:  (surgery pillow) (09/30/15 1000)           Time Spent with Patient: 36 (09/30/15 1000)  Met patient, husband Sonya Lopez, and daughter Sonya Lopez, prior to surgery.  Grateful for literature, and education they have received.  Given underarm pillow for post -op relief.

## 2015-10-01 NOTE — Progress Notes (Signed)
  Oncology Nurse Navigator Documentation  Navigator Location: [facility] (10/01/15 1400) Navigator Encounter Type: Telephone (10/01/15 1400) Telephone: Sonya Lopez Call (post op follow-up) (10/01/15 1400)                                        Time Spent with Patient: 15 (10/01/15 1400)     Oncology Nurse Navigator Documentation  Navigator Location: [facility] (10/01/15 1400) Navigator Encounter Type: Telephone (10/01/15 1400) Telephone: Sonya Lopez Call (post op follow-up) (10/01/15 1400)            Phoned patient post-op.  Daughter states she is resting.  Doing well post-op. To follow-up with Dr. Bary Castilla on 10/07/15.

## 2015-10-02 NOTE — Anesthesia Postprocedure Evaluation (Signed)
Anesthesia Post Note  Patient: Sonya Lopez  Procedure(s) Performed: Procedure(s) (LRB): BREAST LUMPECTOMY WITH NEEDLE LOCALIZATION (Left)  Patient location during evaluation: PACU Anesthesia Type: General Level of consciousness: awake and alert Pain management: pain level controlled Vital Signs Assessment: post-procedure vital signs reviewed and stable Respiratory status: spontaneous breathing, nonlabored ventilation, respiratory function stable and patient connected to nasal cannula oxygen Cardiovascular status: blood pressure returned to baseline and stable Postop Assessment: no signs of nausea or vomiting Anesthetic complications: no    Last Vitals:  Vitals:   09/30/15 1324 09/30/15 1334  BP: (!) 116/59 (!) 116/54  Pulse: (!) 59 (!) 57  Resp: 20 18  Temp: 36.3 C 36.6 C    Last Pain:  Vitals:   10/01/15 0907  TempSrc:   PainSc: 2                  Molli Barrows

## 2015-10-03 ENCOUNTER — Telehealth: Payer: Self-pay | Admitting: General Surgery

## 2015-10-03 DIAGNOSIS — D0512 Intraductal carcinoma in situ of left breast: Secondary | ICD-10-CM

## 2015-10-03 HISTORY — DX: Intraductal carcinoma in situ of left breast: D05.12

## 2015-10-03 NOTE — Telephone Encounter (Signed)
Reports doing well.  Pathology showed high-grade DCIS residual, margins negative, closest 3 mm.  Will follow-up is planned in September 5 mL look at options for post procedure RT therapy.

## 2015-10-07 ENCOUNTER — Encounter: Payer: Self-pay | Admitting: General Surgery

## 2015-10-07 ENCOUNTER — Ambulatory Visit (INDEPENDENT_AMBULATORY_CARE_PROVIDER_SITE_OTHER): Payer: BLUE CROSS/BLUE SHIELD | Admitting: General Surgery

## 2015-10-07 ENCOUNTER — Ambulatory Visit: Payer: Self-pay

## 2015-10-07 ENCOUNTER — Telehealth: Payer: Self-pay | Admitting: General Surgery

## 2015-10-07 VITALS — BP 126/64 | HR 68 | Resp 12 | Ht 66.0 in | Wt 209.0 lb

## 2015-10-07 DIAGNOSIS — D0512 Intraductal carcinoma in situ of left breast: Secondary | ICD-10-CM | POA: Diagnosis not present

## 2015-10-07 LAB — SURGICAL PATHOLOGY

## 2015-10-07 NOTE — Patient Instructions (Addendum)
Continue to use bra for support, until heaviness is subsided. Plan to see Dr. Baruch Gouty.

## 2015-10-07 NOTE — Progress Notes (Signed)
Patient ID: RUTHER SINGLE, female   DOB: [birth year], 60 y.o.   MRN: [id number]  Chief Complaint  Patient presents with  . Routine Post Op    Left breast lumpectomy     HPI RHIANNA SCHWENK is a 60 y.o. female here today for Left breast lumpectomy done on 09/30/15. Patient states she is doing well, has not taken pain medication since Saturday 10/04/15.  HPI  Past Medical History:  Diagnosis Date  . Allergy   . Anxiety    situational anxiety  . Arrhythmia    A-fib/A-flutter  . Atrial flutter ([place])   . Cancer ([place])   . Depression   . Difficult intubation 2009   [place] had to use  'boogee"  . Female cystocele 01/05/2011  . Fibromyalgia   . GERD (gastroesophageal reflux disease)   . Headache(784.0)   . Kidney stones   . Obesity   . PONV (postoperative nausea and vomiting)    h/o difficult intubation and anes had to use "boogee"    Past Surgical History:  Procedure Laterality Date  . ANTERIOR AND POSTERIOR REPAIR  11/30/2011   Procedure: ANTERIOR (CYSTOCELE) AND POSTERIOR REPAIR (RECTOCELE);  Surgeon: Reece Packer, MD;  Location: [facility];  Service: Urology;  Laterality: N/A;  with cysto  . BLADDER SUSPENSION  11/30/2011   Procedure: [facility] PROCEDURE;  Surgeon: Reece Packer, MD;  Location: [facility];  Service: Urology;  Laterality: N/A;  graft 10x6  . BREAST BIOPSY Right 2007   negative core  . BREAST LUMPECTOMY WITH NEEDLE LOCALIZATION Left 09/30/2015   Procedure: BREAST LUMPECTOMY WITH NEEDLE LOCALIZATION;  Surgeon: Robert Bellow, MD;  Location: [facility];  Service: General;  Laterality: Left;  . CESAREAN SECTION     x 1  . COLONOSCOPY  2009  . CYSTOSCOPY  11/30/2011   Procedure: CYSTOSCOPY;  Surgeon: Reece Packer, MD;  Location: [facility];  Service: Urology;;  . Brigitte Pulse AND CURETTAGE OF UTERUS  05/05/2007  . GALLBLADDER SURGERY  2004  . TONSILLECTOMY  1961  . VAGINAL HYSTERECTOMY  11/30/2011   Procedure: HYSTERECTOMY VAGINAL;   Surgeon: Emily Filbert, MD;  Location: [place];  Service: Gynecology;  Laterality: N/A;    Family History  Problem Relation Age of Onset  . Diabetes Mother   . Hearing loss Mother   . Hypertension Mother   . Heart disease Father   . Heart disease Paternal Grandfather   . Asthma Daughter   . Asthma Daughter   . Migraines Daughter   . Fibromyalgia Daughter   . Interstitial cystitis Daughter   . Asthma Daughter   . Colitis Daughter   . Breast cancer Neg Hx     Social History Social History  Substance Use Topics  . Smoking status: Never Smoker  . Smokeless tobacco: Never Used  . Alcohol use 0.0 oz/week     Comment: rare    Allergies  Allergen Reactions  . Adhesive [Tape] Dermatitis    SKIN TURNS RED-PAPER TAPE OK TO USE  . Levaquin  [Levofloxacin]   . Macrodantin [Nitrofurantoin] Nausea And Vomiting  . Metronidazole Nausea Only  . Nitrofurantoin Macrocrystal Nausea And Vomiting  . Sulfamethoxazole-Trimethoprim   . Cephalexin Rash  . Clindamycin Hcl Rash  . Doxycycline Swelling and Rash  . Erythromycin Rash    Current Outpatient Prescriptions  Medication Sig Dispense Refill  . amiodarone (PACERONE) 200 MG tablet Take 0.5 tablets (100 mg total) by mouth at bedtime. 30 tablet 0  .  aspirin EC 81 MG tablet Take 81 mg by mouth daily.    . Azelaic Acid (FINACEA) 15 % cream Apply 1 application topically 2 (two) times daily as needed. For rosacea    . Calcium Carbonate-Vitamin D (CALTRATE 600+D) 600-400 MG-UNIT per tablet Take 1 tablet by mouth daily.     . Clindamycin Phosphate foam as needed. FOR BACK  5  . DULoxetine (CYMBALTA) 60 MG capsule Take 1 capsule (60 mg total) by mouth daily. (Patient taking differently: Take 30-60 mg by mouth at bedtime. ) 30 capsule 2  . gabapentin (NEURONTIN) 100 MG capsule Take 1 capsule (100 mg total) by mouth 2 (two) times daily. (Patient taking differently: Take 100 mg by mouth at bedtime. ) 60 capsule 5  . HYDROcodone-acetaminophen (NORCO)  5-325 MG tablet Take 1-2 tablets by mouth every 4 (four) hours as needed for moderate pain. 30 tablet 0  . magnesium oxide (MAG-OX) 400 MG tablet Take 400 mg by mouth every morning.     . metaxalone (SKELAXIN) 800 MG tablet Take 1 tablet (800 mg total) by mouth at bedtime as needed for muscle spasms. 90 tablet 5  . metoprolol tartrate (LOPRESSOR) 25 MG tablet Take 25 mg by mouth as needed.    . mometasone (NASONEX) 50 MCG/ACT nasal spray Place 2 sprays into the nose every morning.     . naratriptan (AMERGE) 2.5 MG tablet Take 1 tablet (2.5 mg total) by mouth as needed. (Patient taking differently: Take 2.5 mg by mouth as needed for migraine. ) 9 tablet 5  . Probiotic Product (PROBIOTIC PO) Take 1 capsule by mouth daily.     . propranolol (INDERAL) 10 MG tablet Take 1 tablet (10 mg total) by mouth 3 (three) times daily as needed (palpitations). 90 tablet 4  . ranitidine (ZANTAC) 150 MG capsule Take 150 mg by mouth as needed for heartburn.    . tamsulosin (FLOMAX) 0.4 MG CAPS capsule Take 0.8 mg by mouth daily after breakfast.      Current Facility-Administered Medications  Medication Dose Route Frequency Provider Last Rate Last Dose  . lidocaine (XYLOCAINE) 1 % (with pres) injection 10 mL  10 mL Intradermal Once Steele Sizer, MD      . triamcinolone acetonide (KENALOG-40) injection (RADIOLOGY ONLY) 40 mg  40 mg Intra-articular Once Steele Sizer, MD        Review of Systems Review of Systems  Constitutional: Negative.   Respiratory: Negative.   Cardiovascular: Negative.     Blood pressure 126/64, pulse 68, resp. rate 12, height 5\' 6"  (1.676 m), weight 209 lb (94.8 kg).  Physical Exam Physical Exam  Constitutional: She is oriented to person, place, and time. She appears well-developed and well-nourished.  Pulmonary/Chest: Left breast exhibits no inverted nipple, no mass, no nipple discharge and no tenderness.    Well healed incision post lumpectomy Mild bruising  Neurological: She  is alert and oriented to person, place, and time.  Skin: Skin is warm and dry.    Data Reviewed Pathology showed high-grade DCIS, ER greater and 50%, PR 11-50% positive.  Ultrasound examination of the upper-outer quadrant left breast was completed to determine if the patient was a candidate for accelerated partial breast radiation.  There is a well-defined seroma cavity at the 3:00 position measuring 2.0 x 4.8 x 5.9 cm in dimension and a minimal distance of 1.76 cm from the skin.  Assessment    Doing well status post resection of DCIS.     Plan  Arrangements were made for evaluation by radiation oncology. She is a candidate for either accelerated partial breast radiation or whole breast radiation.  Role for antiestrogen therapy after radiation was discussed.     Patient aware to continue to use bra for support until breast heaviness resolves.. Patient to see Dr. Baruch Gouty at the [facility].   Robert Bellow 10/08/2015, 6:50 AM

## 2015-10-07 NOTE — Telephone Encounter (Signed)
Notified husband ER/PR positive. Candidate for anti-estrogen RX after RT completed.

## 2015-10-08 ENCOUNTER — Other Ambulatory Visit: Payer: Self-pay | Admitting: Family Medicine

## 2015-10-08 DIAGNOSIS — K1120 Sialoadenitis, unspecified: Secondary | ICD-10-CM

## 2015-10-08 NOTE — Telephone Encounter (Signed)
Patient requesting refill of Augmentin be sent to [facility].

## 2015-10-14 ENCOUNTER — Ambulatory Visit
Admission: RE | Admit: 2015-10-14 | Discharge: 2015-10-14 | Disposition: A | Discharge status: Home / Self Care | Payer: BLUE CROSS/BLUE SHIELD | Source: Ambulatory Visit | Attending: Radiation Oncology | Admitting: Radiation Oncology

## 2015-10-14 ENCOUNTER — Encounter: Payer: Self-pay | Admitting: Radiation Oncology

## 2015-10-14 ENCOUNTER — Telehealth: Payer: Self-pay | Admitting: *Deleted

## 2015-10-14 VITALS — BP 132/73 | HR 66 | Temp 96.8°F | Resp 18 | Wt 209.0 lb

## 2015-10-14 DIAGNOSIS — Z170 Estrogen receptor positive status [ER+]: Secondary | ICD-10-CM | POA: Diagnosis not present

## 2015-10-14 DIAGNOSIS — Z7982 Long term (current) use of aspirin: Secondary | ICD-10-CM | POA: Diagnosis not present

## 2015-10-14 DIAGNOSIS — M797 Fibromyalgia: Secondary | ICD-10-CM | POA: Diagnosis not present

## 2015-10-14 DIAGNOSIS — K219 Gastro-esophageal reflux disease without esophagitis: Secondary | ICD-10-CM | POA: Insufficient documentation

## 2015-10-14 DIAGNOSIS — I4892 Unspecified atrial flutter: Secondary | ICD-10-CM | POA: Insufficient documentation

## 2015-10-14 DIAGNOSIS — F329 Major depressive disorder, single episode, unspecified: Secondary | ICD-10-CM | POA: Insufficient documentation

## 2015-10-14 DIAGNOSIS — Z87442 Personal history of urinary calculi: Secondary | ICD-10-CM | POA: Insufficient documentation

## 2015-10-14 DIAGNOSIS — Z79899 Other long term (current) drug therapy: Secondary | ICD-10-CM | POA: Insufficient documentation

## 2015-10-14 DIAGNOSIS — Z510 Encounter for antineoplastic radiation therapy: Secondary | ICD-10-CM | POA: Insufficient documentation

## 2015-10-14 DIAGNOSIS — D0512 Intraductal carcinoma in situ of left breast: Secondary | ICD-10-CM

## 2015-10-14 DIAGNOSIS — E669 Obesity, unspecified: Secondary | ICD-10-CM | POA: Diagnosis not present

## 2015-10-14 DIAGNOSIS — F419 Anxiety disorder, unspecified: Secondary | ICD-10-CM | POA: Diagnosis not present

## 2015-10-14 NOTE — Consult Note (Signed)
Except an outstanding is perfect of Radiation Oncology NEW PATIENT EVALUATION  Name: Sonya Lopez  MRN: [id number]  Date:   10/14/2015     DOB: 09/29/1955   This 60 y.o. female patient presents to the clinic for initial evaluation of high-grade DCIS stage 0 (Tis N0 M0) status post wide local excision.  REFERRING PHYSICIAN: Steele Sizer, MD  CHIEF COMPLAINT:  Chief Complaint  Patient presents with  . Breast Cancer    Pt is here for initial consultation of breast cancer.      DIAGNOSIS: The encounter diagnosis was DCIS (ductal carcinoma in situ) of breast, left.   PREVIOUS INVESTIGATIONS:  Mammograms and ultrasound reviewed Pathology report reviewed Clinical notes reviewed Case presented at breast cancer conference  HPI: Patient is a 60 year old female who on yearly screening mammography was detected to have an abnormality of indeterminate group of pleomorphic calcifications lateral aspect of the left breast. They measure approximate 1.5 cm. This was suspicious for malignancy and ultrasound-guided biopsy was performed positive for ductal carcinoma in situ. Tumor was ER/PR positive. She went on to have a wide local excision showing ACE 0.6 cm area of ductal carcinoma in situ again high-grade with comedonecrosis nuclear grade 3. Margins were clear at 3 mm. Patient is been evaluated by ultrasound for possibility of accelerated partial breast irradiation and seems suitable. She is doing well postoperatively. She specifically denies breast tenderness cough or bone pain. She is seen today for radiation oncology opinion.  PLANNED TREATMENT REGIMEN: Accelerated partial breast radiation  PAST MEDICAL HISTORY:  has a past medical history of Allergy; Anxiety; Arrhythmia; Atrial flutter ([place]); Cancer ([place]); Depression; Difficult intubation (2009); Female cystocele (01/05/2011); Fibromyalgia; GERD (gastroesophageal reflux disease); Headache(784.0); Kidney stones; Obesity; and PONV  (postoperative nausea and vomiting).    PAST SURGICAL HISTORY:  Past Surgical History:  Procedure Laterality Date  . ANTERIOR AND POSTERIOR REPAIR  11/30/2011   Procedure: ANTERIOR (CYSTOCELE) AND POSTERIOR REPAIR (RECTOCELE);  Surgeon: Reece Packer, MD;  Location: [place] [facility];  Service: Urology;  Laterality: N/A;  with cysto  . BLADDER SUSPENSION  11/30/2011   Procedure: [facility] PROCEDURE;  Surgeon: Reece Packer, MD;  Location: [place] [facility];  Service: Urology;  Laterality: N/A;  graft 10x6  . BREAST BIOPSY Right 2007   negative core  . BREAST LUMPECTOMY WITH NEEDLE LOCALIZATION Left 09/30/2015   Procedure: BREAST LUMPECTOMY WITH NEEDLE LOCALIZATION;  Surgeon: Robert Bellow, MD;  Location: [facility];  Service: General;  Laterality: Left;  . CESAREAN SECTION     x 1  . COLONOSCOPY  2009  . CYSTOSCOPY  11/30/2011   Procedure: CYSTOSCOPY;  Surgeon: Reece Packer, MD;  Location: [place] [facility];  Service: Urology;;  . Brigitte Pulse AND CURETTAGE OF UTERUS  05/05/2007  . GALLBLADDER SURGERY  2004  . TONSILLECTOMY  1961  . VAGINAL HYSTERECTOMY  11/30/2011   Procedure: HYSTERECTOMY VAGINAL;  Surgeon: Emily Filbert, MD;  Location: [place] [facility];  Service: Gynecology;  Laterality: N/A;    FAMILY HISTORY: family history includes Asthma in her daughter, daughter, and daughter; Colitis in her daughter; Diabetes in her mother; Fibromyalgia in her daughter; Hearing loss in her mother; Heart disease in her father and paternal grandfather; Hypertension in her mother; Interstitial cystitis in her daughter; Migraines in her daughter.  SOCIAL HISTORY:  reports that she has never smoked. She has never used smokeless tobacco. She reports that she drinks alcohol. She reports that she does not use drugs.  ALLERGIES: Adhesive [tape]; Levaquin  [levofloxacin];  Macrodantin [nitrofurantoin]; Metronidazole; Nitrofurantoin macrocrystal; Sulfamethoxazole-trimethoprim; Cephalexin; Clindamycin hcl; Doxycycline; and  Erythromycin  MEDICATIONS:  Current Outpatient Prescriptions  Medication Sig Dispense Refill  . amiodarone (PACERONE) 200 MG tablet Take 0.5 tablets (100 mg total) by mouth at bedtime. 30 tablet 0  . Azelaic Acid (FINACEA) 15 % cream Apply 1 application topically 2 (two) times daily as needed. For rosacea    . Calcium Carbonate-Vitamin D (CALTRATE 600+D) 600-400 MG-UNIT per tablet Take 1 tablet by mouth daily.     . Clindamycin Phosphate foam as needed. FOR BACK  5  . DULoxetine (CYMBALTA) 60 MG capsule Take 1 capsule (60 mg total) by mouth daily. (Patient taking differently: Take 30-60 mg by mouth at bedtime. ) 30 capsule 2  . gabapentin (NEURONTIN) 100 MG capsule Take 1 capsule (100 mg total) by mouth 2 (two) times daily. (Patient taking differently: Take 100 mg by mouth at bedtime. ) 60 capsule 5  . magnesium oxide (MAG-OX) 400 MG tablet Take 400 mg by mouth every morning.     . metaxalone (SKELAXIN) 800 MG tablet Take 1 tablet (800 mg total) by mouth at bedtime as needed for muscle spasms. 90 tablet 5  . metoprolol tartrate (LOPRESSOR) 25 MG tablet Take 25 mg by mouth as needed.    . mometasone (NASONEX) 50 MCG/ACT nasal spray Place 2 sprays into the nose every morning.     . naratriptan (AMERGE) 2.5 MG tablet Take 1 tablet (2.5 mg total) by mouth as needed. (Patient taking differently: Take 2.5 mg by mouth as needed for migraine. ) 9 tablet 5  . Probiotic Product (PROBIOTIC PO) Take 1 capsule by mouth daily.     . propranolol (INDERAL) 10 MG tablet Take 1 tablet (10 mg total) by mouth 3 (three) times daily as needed (palpitations). 90 tablet 4  . ranitidine (ZANTAC) 150 MG capsule Take 150 mg by mouth as needed for heartburn.    . tamsulosin (FLOMAX) 0.4 MG CAPS capsule Take 0.8 mg by mouth daily after breakfast.     . aspirin EC 81 MG tablet Take 81 mg by mouth daily.    Marland Kitchen HYDROcodone-acetaminophen (NORCO) 5-325 MG tablet Take 1-2 tablets by mouth every 4 (four) hours as needed for  moderate pain. (Patient not taking: Reported on 10/14/2015) 30 tablet 0   Current Facility-Administered Medications  Medication Dose Route Frequency Provider Last Rate Last Dose  . lidocaine (XYLOCAINE) 1 % (with pres) injection 10 mL  10 mL Intradermal Once Steele Sizer, MD      . triamcinolone acetonide (KENALOG-40) injection (RADIOLOGY ONLY) 40 mg  40 mg Intra-articular Once Steele Sizer, MD        ECOG PERFORMANCE STATUS:  0 - Asymptomatic  REVIEW OF SYSTEMS:  Patient denies any weight loss, fatigue, weakness, fever, chills or night sweats. Patient denies any loss of vision, blurred vision. Patient denies any ringing  of the ears or hearing loss. No irregular heartbeat. Patient denies heart murmur or history of fainting. Patient denies any chest pain or pain radiating to her upper extremities. Patient denies any shortness of breath, difficulty breathing at night, cough or hemoptysis. Patient denies any swelling in the lower legs. Patient denies any nausea vomiting, vomiting of blood, or coffee ground material in the vomitus. Patient denies any stomach pain. Patient states has had normal bowel movements no significant constipation or diarrhea. Patient denies any dysuria, hematuria or significant nocturia. Patient denies any problems walking, swelling in the joints or loss of balance. Patient denies  any skin changes, loss of hair or loss of weight. Patient denies any excessive worrying or anxiety or significant depression. Patient denies any problems with insomnia. Patient denies excessive thirst, polyuria, polydipsia. Patient denies any swollen glands, patient denies easy bruising or easy bleeding. Patient denies any recent infections, allergies or URI. Patient "s visual fields have not changed significantly in recent time.    PHYSICAL EXAM: BP 132/73   Pulse 66   Temp (!) 96.8 F (36 C)   Resp 18   Wt 208 lb 15.9 oz (94.8 kg)   BMI 33.73 kg/m  She status post wide local excision of the  left breast which is well-healed some scar formation around the lumpectomy site although otherwise no evidence of dominant mass or nodularity in either breast in 2 positions examined. No axillary or supraclavicular adenopathy is identified. Well-developed well-nourished patient in NAD. HEENT reveals PERLA, EOMI, discs not visualized.  Oral cavity is clear. No oral mucosal lesions are identified. Neck is clear without evidence of cervical or supraclavicular adenopathy. Lungs are clear to A&P. Cardiac examination is essentially unremarkable with regular rate and rhythm without murmur rub or thrill. Abdomen is benign with no organomegaly or masses noted. Motor sensory and DTR levels are equal and symmetric in the upper and lower extremities. Cranial nerves II through XII are grossly intact. Proprioception is intact. No peripheral adenopathy or edema is identified. No motor or sensory levels are noted. Crude visual fields are within normal range.  LABORATORY DATA: Pathology reports reviewed    RADIOLOGY RESULTS: Mammograms and ultrasound reviewed   IMPRESSION: Ductal carcinoma in situ high-grade of the left breast status post wide local excision ER/PR positive in 60 year old female  PLAN: At this time I got over treatment recommendations in the adjuvant setting including both whole breast radiation as well as accelerated partial breast radiation. Risks and benefits of both procedures including skin reaction fatigue alteration of blood counts possible inclusion of superficial lung all were discussed in detail with the patient and her husband. Patient is heavily favoring going ahead with accelerated partial breast radiation and I find her a suitable candidate. Will go ahead and schedule her MammoSite catheter placement and follow-up with CT treatment planning and BrachyVision treatment planning. Plan on delivering 3400 cGy in 10 fractions at 340 cGy twice a day. Patient and husband both seem to comprehend my  treatment plan well.  I would like to take this opportunity to thank you for allowing me to participate in the care of your patient.Armstead Peaks., MD

## 2015-10-14 NOTE — Telephone Encounter (Signed)
Notified patient to review allergies/list updated.

## 2015-10-14 NOTE — Progress Notes (Signed)
  Oncology Nurse Navigator Documentation  Navigator Location: [facility] (10/14/15 1400) Navigator Encounter Type: Initial RadOnc;Telephone (10/14/15 1400)                                          Time Spent with Patient: 15 (10/14/15 1400)  Phoned patient post RadOnc initial consult.  States plans for [employer].  Waiting for Dr. Bary Castilla to schedule cateter insertion.  Reminded to call with any needs.

## 2015-10-15 MED ORDER — AMOXICILLIN 500 MG PO CAPS: 500.0000 mg | ORAL_CAPSULE | Freq: Two times a day (BID) | ORAL | 0 refills | Status: DC

## 2015-10-15 NOTE — Telephone Encounter (Signed)
Mammosite schedule reviewed with the patient Placement   10-20-15     at [facility] at 7:45 Scan 10-22-15 Treat 10-23-15, 9-25 through 9-28 Aware the [facility] will be calling her for more details Aware of ATB and directions reviewed. Aware no showers and to wear her bra while mammosite in place. Pt agrees.

## 2015-10-20 ENCOUNTER — Encounter: Payer: Self-pay | Admitting: General Surgery

## 2015-10-20 ENCOUNTER — Ambulatory Visit: Payer: Self-pay

## 2015-10-20 ENCOUNTER — Ambulatory Visit (INDEPENDENT_AMBULATORY_CARE_PROVIDER_SITE_OTHER): Payer: BLUE CROSS/BLUE SHIELD | Admitting: General Surgery

## 2015-10-20 VITALS — BP 130/68 | HR 76 | Resp 12 | Ht 66.0 in | Wt 209.0 lb

## 2015-10-20 DIAGNOSIS — D0512 Intraductal carcinoma in situ of left breast: Secondary | ICD-10-CM

## 2015-10-20 NOTE — Patient Instructions (Signed)
Patient care kit given to patient.  Instructed no showers, sponge bath while mammosite in place, take antibiotic. Follow up with [place] as arranged. Discussed wearing your bra for support at all times.

## 2015-10-20 NOTE — Progress Notes (Signed)
Patient ID: Sonya Lopez, female   DOB: 09-Aug-1955, 60 y.o.   MRN: [id number]  Chief Complaint  Patient presents with  . Routine Post Op    mammosite placement    HPI Sonya Lopez is a 60 y.o. female here today for a mammosite placement. HPI  Past Medical History:  Diagnosis Date  . Allergy   . Anxiety    situational anxiety  . Arrhythmia    A-fib/A-flutter  . Atrial flutter ([place])   . Cancer ([place])   . Depression   . Difficult intubation 2009   [name] had to use  'boogee"  . Female cystocele 01/05/2011  . Fibromyalgia   . GERD (gastroesophageal reflux disease)   . Headache(784.0)   . Kidney stones   . Obesity   . PONV (postoperative nausea and vomiting)    h/o difficult intubation and anes had to use "boogee"    Past Surgical History:  Procedure Laterality Date  . ANTERIOR AND POSTERIOR REPAIR  11/30/2011   Procedure: ANTERIOR (CYSTOCELE) AND POSTERIOR REPAIR (RECTOCELE);  Surgeon: Reece Packer, MD;  Location: [place] [facility];  Service: Urology;  Laterality: N/A;  with cysto  . BLADDER SUSPENSION  11/30/2011   Procedure: [facility] PROCEDURE;  Surgeon: Reece Packer, MD;  Location: [place] [facility];  Service: Urology;  Laterality: N/A;  graft 10x6  . BREAST BIOPSY Right 2007   negative core  . BREAST LUMPECTOMY WITH NEEDLE LOCALIZATION Left 09/30/2015   Procedure: BREAST LUMPECTOMY WITH NEEDLE LOCALIZATION;  Surgeon: Robert Bellow, MD;  Location: [facility];  Service: General;  Laterality: Left;  . CESAREAN SECTION     x 1  . COLONOSCOPY  2009  . CYSTOSCOPY  11/30/2011   Procedure: CYSTOSCOPY;  Surgeon: Reece Packer, MD;  Location: [place] [facility];  Service: Urology;;  . Brigitte Pulse AND CURETTAGE OF UTERUS  05/05/2007  . GALLBLADDER SURGERY  2004  . TONSILLECTOMY  1961  . VAGINAL HYSTERECTOMY  11/30/2011   Procedure: HYSTERECTOMY VAGINAL;  Surgeon: Emily Filbert, MD;  Location: [place] [facility];  Service: Gynecology;  Laterality: N/A;    Family History   Problem Relation Age of Onset  . Diabetes Mother   . Hearing loss Mother   . Hypertension Mother   . Heart disease Father   . Heart disease Paternal Grandfather   . Asthma Daughter   . Asthma Daughter   . Migraines Daughter   . Fibromyalgia Daughter   . Interstitial cystitis Daughter   . Asthma Daughter   . Colitis Daughter   . Breast cancer Neg Hx     Social History Social History  Substance Use Topics  . Smoking status: Never Smoker  . Smokeless tobacco: Never Used  . Alcohol use 0.0 oz/week     Comment: rare    Allergies  Allergen Reactions  . Adhesive [Tape] Dermatitis    SKIN TURNS RED-PAPER TAPE OK TO USE  . Levaquin  [Levofloxacin]   . Macrodantin [Nitrofurantoin] Nausea And Vomiting  . Metronidazole Nausea Only  . Nitrofurantoin Macrocrystal Nausea And Vomiting  . Sulfamethoxazole-Trimethoprim Nausea Only  . Cephalexin Rash  . Clindamycin Hcl Rash  . Doxycycline Swelling and Rash  . Erythromycin Rash    Current Outpatient Prescriptions  Medication Sig Dispense Refill  . amiodarone (PACERONE) 200 MG tablet Take 0.5 tablets (100 mg total) by mouth at bedtime. 30 tablet 0  . amoxicillin (AMOXIL) 500 MG capsule Take 1 capsule (500 mg total) by mouth 2 (two) times daily.  Take 2 tablets 1 hour before office procedure on 10-20-15 23 capsule 0  . aspirin EC 81 MG tablet Take 81 mg by mouth daily.    . Azelaic Acid (FINACEA) 15 % cream Apply 1 application topically 2 (two) times daily as needed. For rosacea    . Calcium Carbonate-Vitamin D (CALTRATE 600+D) 600-400 MG-UNIT per tablet Take 1 tablet by mouth daily.     . Clindamycin Phosphate foam as needed. FOR BACK  5  . DULoxetine (CYMBALTA) 60 MG capsule Take 1 capsule (60 mg total) by mouth daily. (Patient taking differently: Take 30-60 mg by mouth at bedtime. ) 30 capsule 2  . gabapentin (NEURONTIN) 100 MG capsule Take 1 capsule (100 mg total) by mouth 2 (two) times daily. (Patient taking differently: Take 100 mg  by mouth at bedtime. ) 60 capsule 5  . HYDROcodone-acetaminophen (NORCO) 5-325 MG tablet Take 1-2 tablets by mouth every 4 (four) hours as needed for moderate pain. 30 tablet 0  . magnesium oxide (MAG-OX) 400 MG tablet Take 400 mg by mouth every morning.     . metaxalone (SKELAXIN) 800 MG tablet Take 1 tablet (800 mg total) by mouth at bedtime as needed for muscle spasms. 90 tablet 5  . metoprolol tartrate (LOPRESSOR) 25 MG tablet Take 25 mg by mouth as needed.    . mometasone (NASONEX) 50 MCG/ACT nasal spray Place 2 sprays into the nose every morning.     . naratriptan (AMERGE) 2.5 MG tablet Take 1 tablet (2.5 mg total) by mouth as needed. (Patient taking differently: Take 2.5 mg by mouth as needed for migraine. ) 9 tablet 5  . Probiotic Product (PROBIOTIC PO) Take 1 capsule by mouth daily.     . propranolol (INDERAL) 10 MG tablet Take 1 tablet (10 mg total) by mouth 3 (three) times daily as needed (palpitations). 90 tablet 4  . ranitidine (ZANTAC) 150 MG capsule Take 150 mg by mouth as needed for heartburn.    . tamsulosin (FLOMAX) 0.4 MG CAPS capsule Take 0.8 mg by mouth daily after breakfast.      Current Facility-Administered Medications  Medication Dose Route Frequency Provider Last Rate Last Dose  . lidocaine (XYLOCAINE) 1 % (with pres) injection 10 mL  10 mL Intradermal Once Steele Sizer, MD      . triamcinolone acetonide (KENALOG-40) injection (RADIOLOGY ONLY) 40 mg  40 mg Intra-articular Once Steele Sizer, MD        Review of Systems Review of Systems  Constitutional: Negative.   Respiratory: Negative.   Cardiovascular: Negative.     Blood pressure 130/68, pulse 76, resp. rate 12, height 5\' 6"  (1.676 m), weight 209 lb (94.8 kg).  Physical Exam Physical Exam  Constitutional: She is oriented to person, place, and time. She appears well-developed and well-nourished.  Pulmonary/Chest:    Neurological: She is alert and oriented to person, place, and time.  Skin: Skin is  warm and dry.    Data Reviewed Radiation oncology notes reviewed. Candidate for accelerated partial breast radiation.  Assessment    High-grade DCIS, resected the negative margins.    Plan    The procedure was reviewed with the patient and she was amenable to proceed. Ultrasound once again confirmed an adequate cavity. 10 mL of 0.5% Xylocaine with 0.25% Marcaine with 1-200,000 epinephrine was utilized well tolerated. The cavity was drained with an 8 mm trocar. A cavity evaluation device was advanced under ultrasound guidance. Inflations at 45 cm showed an adequate spacing of 1  cm. The treatment balloon was placed and insufflated with a mix of saline and Omnipaque. 45 mL was instilled. A spherical balloon was noted and minimal spacing of 1 cm was appreciated. No evidence of loculated fluid or air trapping. Bacitracin was applied to the site followed by dry dressing.  The patient will present for CT imaging on Wednesday, September 20. Follow. 2 weeks to initiate her antiestrogen therapy.     This information has been scribed by Gaspar Cola CMA.   Robert Bellow 10/20/2015, 8:16 PM

## 2015-10-22 ENCOUNTER — Ambulatory Visit
Admission: RE | Admit: 2015-10-22 | Discharge: 2015-10-22 | Disposition: A | Discharge status: Home / Self Care | Payer: BLUE CROSS/BLUE SHIELD | Source: Ambulatory Visit | Attending: Radiation Oncology | Admitting: Radiation Oncology

## 2015-10-22 DIAGNOSIS — D0512 Intraductal carcinoma in situ of left breast: Secondary | ICD-10-CM | POA: Diagnosis not present

## 2015-10-23 ENCOUNTER — Ambulatory Visit
Admission: RE | Admit: 2015-10-23 | Discharge: 2015-10-23 | Disposition: A | Discharge status: Home / Self Care | Payer: BLUE CROSS/BLUE SHIELD | Source: Ambulatory Visit | Attending: Radiation Oncology | Admitting: Radiation Oncology

## 2015-10-23 DIAGNOSIS — D0512 Intraductal carcinoma in situ of left breast: Secondary | ICD-10-CM | POA: Diagnosis not present

## 2015-10-27 ENCOUNTER — Ambulatory Visit
Admission: RE | Admit: 2015-10-27 | Discharge: 2015-10-27 | Disposition: A | Discharge status: Home / Self Care | Payer: BLUE CROSS/BLUE SHIELD | Source: Ambulatory Visit | Attending: Radiation Oncology | Admitting: Radiation Oncology

## 2015-10-27 ENCOUNTER — Other Ambulatory Visit: Payer: Self-pay | Admitting: Family Medicine

## 2015-10-27 DIAGNOSIS — D0512 Intraductal carcinoma in situ of left breast: Secondary | ICD-10-CM | POA: Diagnosis not present

## 2015-10-28 ENCOUNTER — Telehealth: Payer: Self-pay

## 2015-10-28 ENCOUNTER — Ambulatory Visit
Admission: RE | Admit: 2015-10-28 | Discharge: 2015-10-28 | Disposition: A | Discharge status: Home / Self Care | Payer: BLUE CROSS/BLUE SHIELD | Source: Ambulatory Visit | Attending: Radiation Oncology | Admitting: Radiation Oncology

## 2015-10-28 ENCOUNTER — Ambulatory Visit: Payer: BLUE CROSS/BLUE SHIELD

## 2015-10-28 ENCOUNTER — Other Ambulatory Visit: Payer: Self-pay | Admitting: Family Medicine

## 2015-10-28 DIAGNOSIS — D0512 Intraductal carcinoma in situ of left breast: Secondary | ICD-10-CM | POA: Diagnosis not present

## 2015-10-28 MED ORDER — FLUCONAZOLE 150 MG PO TABS: 150.0000 mg | ORAL_TABLET | ORAL | 0 refills | Status: DC

## 2015-10-28 NOTE — Telephone Encounter (Signed)
Patient states she is doing radiation for her breast cancer and the doctor there put her on Amoxicillin from September 18th through this Thursday. Patient is starting to develop a yeast infection from the antibiotic and is requesting medication for relief from the yeast. Please send into [facility].

## 2015-10-29 ENCOUNTER — Ambulatory Visit
Admission: RE | Admit: 2015-10-29 | Discharge: 2015-10-29 | Disposition: A | Discharge status: Home / Self Care | Payer: BLUE CROSS/BLUE SHIELD | Source: Ambulatory Visit | Attending: Radiation Oncology | Admitting: Radiation Oncology

## 2015-10-29 DIAGNOSIS — D0512 Intraductal carcinoma in situ of left breast: Secondary | ICD-10-CM | POA: Diagnosis not present

## 2015-10-30 ENCOUNTER — Ambulatory Visit
Admission: RE | Admit: 2015-10-30 | Discharge: 2015-10-30 | Disposition: A | Discharge status: Home / Self Care | Payer: BLUE CROSS/BLUE SHIELD | Source: Ambulatory Visit | Attending: Radiation Oncology | Admitting: Radiation Oncology

## 2015-10-30 DIAGNOSIS — D0512 Intraductal carcinoma in situ of left breast: Secondary | ICD-10-CM | POA: Diagnosis not present

## 2015-11-05 ENCOUNTER — Ambulatory Visit (INDEPENDENT_AMBULATORY_CARE_PROVIDER_SITE_OTHER): Payer: BLUE CROSS/BLUE SHIELD | Admitting: General Surgery

## 2015-11-05 ENCOUNTER — Encounter: Payer: Self-pay | Admitting: General Surgery

## 2015-11-05 ENCOUNTER — Encounter: Payer: Self-pay | Admitting: Family Medicine

## 2015-11-05 VITALS — BP 124/78 | HR 72 | Resp 12 | Ht 66.0 in | Wt 211.0 lb

## 2015-11-05 DIAGNOSIS — R920 Mammographic microcalcification found on diagnostic imaging of breast: Secondary | ICD-10-CM

## 2015-11-05 MED ORDER — TAMOXIFEN CITRATE 20 MG PO TABS: 20.0000 mg | ORAL_TABLET | Freq: Every day | ORAL | 3 refills | Status: DC

## 2015-11-05 NOTE — Progress Notes (Signed)
Patient ID: [name], female   DOB: 1955/06/03, 60 y.o.   MRN: [id number]  Chief Complaint  Patient presents with  . Follow-up    mammoosite     HPI Sonya Lopez is a 60 y.o. female here today for her two week follow up mammosite .Patient states she is doing well. Minimal discomfort during MammoSite balloon removal. HPI  Past Medical History:  Diagnosis Date  . Allergy   . Anxiety    situational anxiety  . Arrhythmia    A-fib/A-flutter  . Atrial flutter ([place])   . Cancer ([place])   . Depression   . Difficult intubation 2009   [place] had to use  'boogee"  . Female cystocele 01/05/2011  . Fibromyalgia   . GERD (gastroesophageal reflux disease)   . Headache(784.0)   . Kidney stones   . Obesity   . PONV (postoperative nausea and vomiting)    h/o difficult intubation and anes had to use "boogee"    Past Surgical History:  Procedure Laterality Date  . ANTERIOR AND POSTERIOR REPAIR  11/30/2011   Procedure: ANTERIOR (CYSTOCELE) AND POSTERIOR REPAIR (RECTOCELE);  Surgeon: Reece Packer, MD;  Location: [place];  Service: Urology;  Laterality: N/A;  with cysto  . BLADDER SUSPENSION  11/30/2011   Procedure: [facility] PROCEDURE;  Surgeon: Reece Packer, MD;  Location: [place] [facility];  Service: Urology;  Laterality: N/A;  graft 10x6  . BREAST BIOPSY Right 2007   negative core  . BREAST LUMPECTOMY WITH NEEDLE LOCALIZATION Left 09/30/2015   Procedure: BREAST LUMPECTOMY WITH NEEDLE LOCALIZATION;  Surgeon: Robert Bellow, MD;  Location: [facility];  Service: General;  Laterality: Left;  . CESAREAN SECTION     x 1  . COLONOSCOPY  2009  . CYSTOSCOPY  11/30/2011   Procedure: CYSTOSCOPY;  Surgeon: Reece Packer, MD;  Location: [facility];  Service: Urology;;  . Sonya Lopez AND CURETTAGE OF UTERUS  05/05/2007  . GALLBLADDER SURGERY  2004  . TONSILLECTOMY  1961  . VAGINAL HYSTERECTOMY  11/30/2011   Procedure: HYSTERECTOMY VAGINAL;  Surgeon: Emily Filbert, MD;   Location: [facility];  Service: Gynecology;  Laterality: N/A;    Family History  Problem Relation Age of Onset  . Diabetes Mother   . Hearing loss Mother   . Hypertension Mother   . Heart disease Father   . Heart disease Paternal Grandfather   . Asthma Daughter   . Asthma Daughter   . Migraines Daughter   . Fibromyalgia Daughter   . Interstitial cystitis Daughter   . Asthma Daughter   . Colitis Daughter   . Breast cancer Neg Hx     Social History Social History  Substance Use Topics  . Smoking status: Never Smoker  . Smokeless tobacco: Never Used  . Alcohol use 0.0 oz/week     Comment: rare    Allergies  Allergen Reactions  . Adhesive [Tape] Dermatitis    SKIN TURNS RED-PAPER TAPE OK TO USE  . Levaquin  [Levofloxacin]   . Macrodantin [Nitrofurantoin] Nausea And Vomiting  . Metronidazole Nausea Only  . Nitrofurantoin Macrocrystal Nausea And Vomiting  . Sulfamethoxazole-Trimethoprim Nausea Only  . Cephalexin Rash  . Clindamycin Hcl Rash  . Doxycycline Swelling and Rash  . Erythromycin Rash    Current Outpatient Prescriptions  Medication Sig Dispense Refill  . amiodarone (PACERONE) 200 MG tablet Take 0.5 tablets (100 mg total) by mouth at bedtime. 30 tablet 0  . aspirin EC 81 MG  tablet Take 81 mg by mouth daily.    . Azelaic Acid (FINACEA) 15 % cream Apply 1 application topically 2 (two) times daily as needed. For rosacea    . Calcium Carbonate-Vitamin D (CALTRATE 600+D) 600-400 MG-UNIT per tablet Take 1 tablet by mouth daily.     . Clindamycin Phosphate foam as needed. FOR BACK  5  . DULoxetine (CYMBALTA) 60 MG capsule Take 1 capsule (60 mg total) by mouth daily. (Patient taking differently: Take 30-60 mg by mouth at bedtime. ) 30 capsule 2  . fluconazole (DIFLUCAN) 150 MG tablet Take 1 tablet (150 mg total) by mouth every other day. 3 tablet 0  . gabapentin (NEURONTIN) 100 MG capsule TAKE ONE CAPSULE BY MOUTH TWO TIMES DAILY 60 capsule 2  . HYDROcodone-acetaminophen  (NORCO) 5-325 MG tablet Take 1-2 tablets by mouth every 4 (four) hours as needed for moderate pain. 30 tablet 0  . magnesium oxide (MAG-OX) 400 MG tablet Take 400 mg by mouth every morning.     . metaxalone (SKELAXIN) 800 MG tablet Take 1 tablet (800 mg total) by mouth at bedtime as needed for muscle spasms. 90 tablet 5  . metoprolol tartrate (LOPRESSOR) 25 MG tablet Take 25 mg by mouth as needed.    . mometasone (NASONEX) 50 MCG/ACT nasal spray Place 2 sprays into the nose every morning.     . naratriptan (AMERGE) 2.5 MG tablet Take 1 tablet (2.5 mg total) by mouth as needed. (Patient taking differently: Take 2.5 mg by mouth as needed for migraine. ) 9 tablet 5  . Probiotic Product (PROBIOTIC PO) Take 1 capsule by mouth daily.     . propranolol (INDERAL) 10 MG tablet Take 1 tablet (10 mg total) by mouth 3 (three) times daily as needed (palpitations). 90 tablet 4  . ranitidine (ZANTAC) 150 MG capsule Take 150 mg by mouth as needed for heartburn.    . tamsulosin (FLOMAX) 0.4 MG CAPS capsule Take 0.8 mg by mouth daily after breakfast.     . tamoxifen (NOLVADEX) 20 MG tablet Take 1 tablet (20 mg total) by mouth daily. 90 tablet 3   Current Facility-Administered Medications  Medication Dose Route Frequency Provider Last Rate Last Dose  . lidocaine (XYLOCAINE) 1 % (with pres) injection 10 mL  10 mL Intradermal Once Steele Sizer, MD      . triamcinolone acetonide (KENALOG-40) injection (RADIOLOGY ONLY) 40 mg  40 mg Intra-articular Once Steele Sizer, MD        Review of Systems Review of Systems  Constitutional: Negative.   Respiratory: Negative.   Cardiovascular: Negative.     Blood pressure 124/78, Lopez 72, resp. rate 12, height 5\' 6"  (1.676 m), weight 211 lb (95.7 kg).  Physical Exam Physical Exam  Constitutional: She is oriented to person, place, and time. She appears well-developed and well-nourished.  Eyes: Conjunctivae are normal. No scleral icterus.  Neck: Neck supple.   Cardiovascular: Normal rate, regular rhythm and normal heart sounds.   Pulmonary/Chest: Effort normal and breath sounds normal.    Lymphadenopathy:    She has no cervical adenopathy.  Neurological: She is alert and oriented to person, place, and time.  Skin: Skin is warm and dry.    Data Reviewed ER/PR positive DCIS.  Assessment    Doing well status post wide excision and accelerated partial breast radiation.    Plan    Indications for antiestrogen therapy reviewed. The patient was placed on Cymbalta about 9 months ago due to stress with her  mother's illness, assist with management of her fibromyalgia and her oldest daughters separation. At this time she was given the option of changing to a different antidepressant that does not accelerate metabolism of tamoxifen, making use of aromatase inhibitor other significant increased cost or tapering off her Cymbalta. The latter was chosen.  She had abrasion been started on 60 mg per day, the last several months she's been making use of 30 mg per day. She'll decrease to an every other day dose for the next 2 weeks and if she does well will decrease to a every 3 day dose for the next 2 weeks. We will go ahead and institute tamoxifen therapy while she is weaning off the Cymbalta.  The patient was advised of the risks including that related DVT and vasomotor symptoms. She status post hysterectomy) sees.   Return one month. This information has been scribed by Gaspar Cola CMA.    Robert Bellow 11/05/2015, 9:00 PM

## 2015-11-05 NOTE — Patient Instructions (Signed)
Return in one month.  

## 2015-11-12 ENCOUNTER — Ambulatory Visit (INDEPENDENT_AMBULATORY_CARE_PROVIDER_SITE_OTHER): Payer: BLUE CROSS/BLUE SHIELD | Admitting: Family Medicine

## 2015-11-12 ENCOUNTER — Encounter: Payer: Self-pay | Admitting: Family Medicine

## 2015-11-12 VITALS — BP 118/64 | HR 81 | Temp 98.0°F | Resp 16 | Ht 66.0 in | Wt 211.5 lb

## 2015-11-12 DIAGNOSIS — F320 Major depressive disorder, single episode, mild: Secondary | ICD-10-CM | POA: Diagnosis not present

## 2015-11-12 DIAGNOSIS — Z5181 Encounter for therapeutic drug level monitoring: Secondary | ICD-10-CM

## 2015-11-12 DIAGNOSIS — Z13820 Encounter for screening for osteoporosis: Secondary | ICD-10-CM

## 2015-11-12 DIAGNOSIS — E2839 Other primary ovarian failure: Secondary | ICD-10-CM | POA: Diagnosis not present

## 2015-11-12 DIAGNOSIS — D0512 Intraductal carcinoma in situ of left breast: Secondary | ICD-10-CM | POA: Diagnosis not present

## 2015-11-12 DIAGNOSIS — J069 Acute upper respiratory infection, unspecified: Secondary | ICD-10-CM

## 2015-11-12 DIAGNOSIS — Z79810 Long term (current) use of selective estrogen receptor modulators (SERMs): Secondary | ICD-10-CM | POA: Diagnosis not present

## 2015-11-12 DIAGNOSIS — B9789 Other viral agents as the cause of diseases classified elsewhere: Secondary | ICD-10-CM

## 2015-11-12 MED ORDER — VENLAFAXINE HCL ER 37.5 MG PO CP24: 37.5000 mg | ORAL_CAPSULE | Freq: Every day | ORAL | 0 refills | Status: DC

## 2015-11-12 NOTE — Progress Notes (Addendum)
Name: Sonya Lopez   MRN: [id number]    DOB: 12/10/1955   Date:11/12/2015       Progress Note  Subjective  Chief Complaint  Chief Complaint  Patient presents with  . Medication Reaction    Patient states Dr. Bary Castilla could not take Cymbalta and Tamoxifen together. Would like to discuss other medication options.     HPI  DCIS: s/p lumpectomy on left breast on 09/29/2015 and is now starting on Tamoxifen, however Dr. Fleet Contras explained to her that is contra-indicated with Cymbalta. She is here today to discuss options. Based on Epocrates interactions it is safe to switch to Effexor. She is under  A lot of stress between taking care of her mother, daughter recently separated, she is also home schooling her grand-daughter. She has been forgetful, she has crying spells over the past few weeks.  URI: patient states that her two daughters had similar symptoms, she states started with clear rhinorrhea, nasal congestion and sneezing, followed by mild cough, she states symptoms are improving, no fever or chills.   Patient Active Problem List   Diagnosis Date Noted  . DCIS (ductal carcinoma in situ) of breast 09/16/2015  . Long-term use of high-risk medication 02/25/2015  . Increased urinary frequency 08/01/2014  . Fibrocystic breast disease 07/29/2014  . Mild major depression ([place]) 07/29/2014  . Gastric reflux 07/29/2014  . Chronic interstitial cystitis 07/29/2014  . Calculus of kidney 07/29/2014  . Allergic rhinitis 07/29/2014  . Acne erythematosa 07/29/2014  . Lumbosacral radiculitis 07/29/2014  . Tinnitus 07/29/2014  . Mixed incontinence, urge and stress (female) (female) 08/23/2013  . Hyperlipidemia 04/25/2013  . H/O: hysterectomy 11/30/2011  . Atrial flutter ([place]) 05/03/2011  . Diet-controlled type 2 diabetes mellitus ([place]) 04/16/2011  . Bladder cystocele 01/05/2011  . Migraine without aura and without status migrainosus, not intractable 12/29/2010  . Fibromyalgia 12/29/2010  .  Obesity 12/29/2010    Past Surgical History:  Procedure Laterality Date  . ANTERIOR AND POSTERIOR REPAIR  11/30/2011   Procedure: ANTERIOR (CYSTOCELE) AND POSTERIOR REPAIR (RECTOCELE);  Surgeon: Reece Packer, MD;  Location: [facility];  Service: Urology;  Laterality: N/A;  with cysto  . BLADDER SUSPENSION  11/30/2011   Procedure: [facility] PROCEDURE;  Surgeon: Reece Packer, MD;  Location: [facility];  Service: Urology;  Laterality: N/A;  graft 10x6  . BREAST BIOPSY Right 2007   negative core  . BREAST LUMPECTOMY WITH NEEDLE LOCALIZATION Left 09/30/2015   Procedure: BREAST LUMPECTOMY WITH NEEDLE LOCALIZATION;  Surgeon: Robert Bellow, MD;  Location: [facility];  Service: General;  Laterality: Left;  . CESAREAN SECTION     x 1  . COLONOSCOPY  2009  . CYSTOSCOPY  11/30/2011   Procedure: CYSTOSCOPY;  Surgeon: Reece Packer, MD;  Location: [facility];  Service: Urology;;  . Brigitte Pulse AND CURETTAGE OF UTERUS  05/05/2007  . GALLBLADDER SURGERY  2004  . TONSILLECTOMY  1961  . VAGINAL HYSTERECTOMY  11/30/2011   Procedure: HYSTERECTOMY VAGINAL;  Surgeon: Emily Filbert, MD;  Location: [facility];  Service: Gynecology;  Laterality: N/A;    Family History  Problem Relation Age of Onset  . Diabetes Mother   . Hearing loss Mother   . Hypertension Mother   . Heart disease Father   . Heart disease Paternal Grandfather   . Asthma Daughter   . Asthma Daughter   . Migraines Daughter   . Fibromyalgia Daughter   . Interstitial cystitis Daughter   . Asthma Daughter   .  Colitis Daughter   . Breast cancer Neg Hx     Social History   Social History  . Marital status: Married    Spouse name: N/A  . Number of children: N/A  . Years of education: N/A   Occupational History  . Not on file.   Social History Main Topics  . Smoking status: Never Smoker  . Smokeless tobacco: Never Used  . Alcohol use 0.0 oz/week     Comment: rare  . Drug use: No  . Sexual activity: Yes    Partners: Male   Other  Topics Concern  . Not on file   Social History Narrative  . No narrative on file     Current Outpatient Prescriptions:  .  amiodarone (PACERONE) 200 MG tablet, Take 0.5 tablets (100 mg total) by mouth at bedtime., Disp: 30 tablet, Rfl: 0 .  aspirin EC 81 MG tablet, Take 81 mg by mouth daily., Disp: , Rfl:  .  Azelaic Acid (FINACEA) 15 % cream, Apply 1 application topically 2 (two) times daily as needed. For rosacea, Disp: , Rfl:  .  Calcium Carbonate-Vitamin D (CALTRATE 600+D) 600-400 MG-UNIT per tablet, Take 1 tablet by mouth daily. , Disp: , Rfl:  .  Clindamycin Phosphate foam, as needed. FOR BACK, Disp: , Rfl: 5 .  DULoxetine (CYMBALTA) 60 MG capsule, Take 1 capsule (60 mg total) by mouth daily. (Patient taking differently: Take 30-60 mg by mouth at bedtime. ), Disp: 30 capsule, Rfl: 2 .  gabapentin (NEURONTIN) 100 MG capsule, TAKE ONE CAPSULE BY MOUTH TWO TIMES DAILY, Disp: 60 capsule, Rfl: 2 .  magnesium oxide (MAG-OX) 400 MG tablet, Take 400 mg by mouth every morning. , Disp: , Rfl:  .  metaxalone (SKELAXIN) 800 MG tablet, Take 1 tablet (800 mg total) by mouth at bedtime as needed for muscle spasms., Disp: 90 tablet, Rfl: 5 .  metoprolol tartrate (LOPRESSOR) 25 MG tablet, Take 25 mg by mouth as needed., Disp: , Rfl:  .  mometasone (NASONEX) 50 MCG/ACT nasal spray, Place 2 sprays into the nose every morning. , Disp: , Rfl:  .  naratriptan (AMERGE) 2.5 MG tablet, Take 1 tablet (2.5 mg total) by mouth as needed. (Patient taking differently: Take 2.5 mg by mouth as needed for migraine. ), Disp: 9 tablet, Rfl: 5 .  Probiotic Product (PROBIOTIC PO), Take 1 capsule by mouth daily. , Disp: , Rfl:  .  propranolol (INDERAL) 10 MG tablet, Take 1 tablet (10 mg total) by mouth 3 (three) times daily as needed (palpitations)., Disp: 90 tablet, Rfl: 4 .  ranitidine (ZANTAC) 150 MG capsule, Take 150 mg by mouth as needed for heartburn., Disp: , Rfl:  .  tamoxifen (NOLVADEX) 20 MG tablet, Take 1 tablet  (20 mg total) by mouth daily., Disp: 90 tablet, Rfl: 3 .  tamsulosin (FLOMAX) 0.4 MG CAPS capsule, Take 0.8 mg by mouth daily after breakfast. , Disp: , Rfl:  .  fluconazole (DIFLUCAN) 150 MG tablet, Take 1 tablet (150 mg total) by mouth every other day. (Patient not taking: Reported on 11/12/2015), Disp: 3 tablet, Rfl: 0 .  HYDROcodone-acetaminophen (NORCO) 5-325 MG tablet, Take 1-2 tablets by mouth every 4 (four) hours as needed for moderate pain. (Patient not taking: Reported on 11/12/2015), Disp: 30 tablet, Rfl: 0 .  venlafaxine XR (EFFEXOR XR) 37.5 MG 24 hr capsule, Take 1 capsule (37.5 mg total) by mouth daily with breakfast. Increase to two daily after the first week, Disp: 60 capsule, Rfl:  0  Allergies  Allergen Reactions  . Adhesive [Tape] Dermatitis    SKIN TURNS RED-PAPER TAPE OK TO USE  . Levaquin  [Levofloxacin]   . Macrodantin [Nitrofurantoin] Nausea And Vomiting  . Metronidazole Nausea Only  . Nitrofurantoin Macrocrystal Nausea And Vomiting  . Sulfamethoxazole-Trimethoprim Nausea Only  . Cephalexin Rash  . Clindamycin Hcl Rash  . Doxycycline Swelling and Rash  . Erythromycin Rash     ROS  Ten systems reviewed and is negative except as mentioned in HPI   Objective  Vitals:   11/12/15 1029  BP: 118/64  Pulse: 81  Resp: 16  Temp: 98 F (36.7 C)  TempSrc: Oral  SpO2: 94%  Weight: 211 lb 8 oz (95.9 kg)  Height: 5\' 6"  (1.676 m)    Body mass index is 34.14 kg/m.  Physical Exam  Constitutional: Patient appears well-developed and well-nourished. Obese  No distress.  HEENT: head atraumatic, normocephalic, pupils equal and reactive to light,  neck supple, throat within normal limits, boggy turbinates, clear rhinorrhea, tender left maxillary sinus Cardiovascular: Normal rate, regular rhythm and normal heart sounds.  No murmur heard. No BLE edema. Pulmonary/Chest: Effort normal and breath sounds normal. No respiratory distress. Abdominal: Soft.  There is no  tenderness. Psychiatric: Patient has a normal mood and affect. behavior is normal. Judgment and thought content normal.   PHQ2/9: Depression screen [facility] 2/9 10/14/2015 09/24/2015 06/02/2015 03/04/2015 01/31/2015  Decreased Interest 0 0 0 0 0  Down, Depressed, Hopeless 0 0 0 0 0  PHQ - 2 Score 0 0 0 0 0    Fall Risk: Fall Risk  10/14/2015 09/24/2015 06/02/2015 03/04/2015 01/31/2015  Falls in the past year? No No No No No      Assessment & Plan  1. Ductal carcinoma in situ (DCIS) of left breast  Continue follow up with Dr. Fleet Contras  2. Encounter for monitoring tamoxifen therapy  - DG Bone Density; Future  3. Ovarian failure  - DG Bone Density; Future  4. Screening for osteoporosis  - DG Bone Density; Future  5. Mild major depression ([facility])  - venlafaxine XR (EFFEXOR XR) 37.5 MG 24 hr capsule; Take 1 capsule (37.5 mg total) by mouth daily with breakfast. Increase to two daily after the first week  Dispense: 60 capsule; Refill: 0   6. Viral upper respiratory tract infection  Reassurance for now

## 2015-11-18 ENCOUNTER — Ambulatory Visit
Admission: RE | Admit: 2015-11-18 | Discharge: 2015-11-18 | Disposition: A | Discharge status: Home / Self Care | Payer: BLUE CROSS/BLUE SHIELD | Source: Ambulatory Visit | Attending: Family Medicine | Admitting: Family Medicine

## 2015-11-18 DIAGNOSIS — Z79810 Long term (current) use of selective estrogen receptor modulators (SERMs): Secondary | ICD-10-CM | POA: Insufficient documentation

## 2015-11-18 DIAGNOSIS — Z13820 Encounter for screening for osteoporosis: Secondary | ICD-10-CM | POA: Diagnosis not present

## 2015-11-18 DIAGNOSIS — Z780 Asymptomatic menopausal state: Secondary | ICD-10-CM | POA: Diagnosis not present

## 2015-11-18 DIAGNOSIS — Z5181 Encounter for therapeutic drug level monitoring: Secondary | ICD-10-CM | POA: Diagnosis not present

## 2015-11-18 DIAGNOSIS — E2839 Other primary ovarian failure: Secondary | ICD-10-CM

## 2015-11-25 ENCOUNTER — Encounter: Payer: Self-pay | Admitting: Family Medicine

## 2015-11-25 ENCOUNTER — Ambulatory Visit (INDEPENDENT_AMBULATORY_CARE_PROVIDER_SITE_OTHER): Payer: BLUE CROSS/BLUE SHIELD | Admitting: Family Medicine

## 2015-11-25 VITALS — BP 110/58 | HR 80 | Temp 98.1°F | Resp 16 | Ht 66.0 in | Wt 209.2 lb

## 2015-11-25 DIAGNOSIS — F320 Major depressive disorder, single episode, mild: Secondary | ICD-10-CM

## 2015-11-25 MED ORDER — VENLAFAXINE HCL ER 75 MG PO CP24: 75.0000 mg | ORAL_CAPSULE | Freq: Every day | ORAL | 0 refills | Status: DC

## 2015-11-25 NOTE — Progress Notes (Signed)
Name: Sonya Lopez   MRN: [id number]    DOB: 1955/06/17   Date:11/25/2015       Progress Note  Subjective  Chief Complaint  Chief Complaint  Patient presents with  . Discuss Medication  . Depression    Doing well with new medication, just having slight tremors, nausea, and hotflashes but could be from new medication for cancer treatments.     HPI  Major Depression Mild: A lot of stress between taking care of her mother, daughter recently separated, she is no longer  home schooling her grand-daughter, her daughter Sonya Lopez is doing that now ). She was starting Effexor in place of Cymbalta - she has noticed mild tremors on hands, and mild nausea after taking the pills. 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    Patient Active Problem List   Diagnosis Date Noted  . DCIS (ductal carcinoma in situ) of breast 09/16/2015  . Long-term use of high-risk medication 02/25/2015  . Increased urinary frequency 08/01/2014  . Fibrocystic breast disease 07/29/2014  . Mild major depression ([place]) 07/29/2014  . Gastric reflux 07/29/2014  . Chronic interstitial cystitis 07/29/2014  . Calculus of kidney 07/29/2014  . Allergic rhinitis 07/29/2014  . Acne erythematosa 07/29/2014  . Lumbosacral radiculitis 07/29/2014  . Tinnitus 07/29/2014  . Mixed incontinence, urge and stress (female) (female) 08/23/2013  . Hyperlipidemia 04/25/2013  . H/O: hysterectomy 11/30/2011  . Atrial flutter ([place]) 05/03/2011  . Diet-controlled type 2 diabetes mellitus ([place]) 04/16/2011  . Bladder cystocele 01/05/2011  . Migraine without aura and without status migrainosus, not intractable 12/29/2010  . Fibromyalgia 12/29/2010  . Obesity 12/29/2010    Past Surgical History:  Procedure Laterality Date  . ANTERIOR AND POSTERIOR REPAIR  11/30/2011   Procedure: ANTERIOR (CYSTOCELE) AND POSTERIOR REPAIR (RECTOCELE);  Surgeon: Reece Packer, MD;  Location: [facility];  Service: Urology;  Laterality: N/A;  with cysto  . BLADDER SUSPENSION  11/30/2011   Procedure: [facility] PROCEDURE;  Surgeon: Reece Packer, MD;  Location: [facility];  Service: Urology;  Laterality: N/A;  graft 10x6  . BREAST BIOPSY Right 2007   negative core  . BREAST LUMPECTOMY WITH NEEDLE LOCALIZATION Left 09/30/2015   Procedure: BREAST LUMPECTOMY WITH NEEDLE LOCALIZATION;  Surgeon: Robert Bellow, MD;  Location: [facility];  Service: General;  Laterality: Left;  . CESAREAN SECTION     x 1  . COLONOSCOPY  2009  . CYSTOSCOPY  11/30/2011   Procedure: CYSTOSCOPY;  Surgeon: Reece Packer, MD;  Location: [facility];  Service: Urology;;  . Brigitte Pulse AND CURETTAGE OF UTERUS  05/05/2007  . GALLBLADDER SURGERY  2004  . TONSILLECTOMY  1961  . VAGINAL HYSTERECTOMY  11/30/2011   Procedure: HYSTERECTOMY VAGINAL;  Surgeon: Emily Filbert, MD;  Location: [facility];  Service: Gynecology;  Laterality: N/A;    Family History  Problem Relation Age of Onset  . Diabetes Mother   . Hearing loss Mother   . Hypertension Mother   . Heart disease Father   . Heart disease Paternal Grandfather   . Asthma Daughter   . Asthma Daughter   . Migraines Daughter   . Fibromyalgia Daughter   . Interstitial cystitis Daughter   . Asthma Daughter   . Colitis Daughter   . Breast cancer Neg Hx     Social History   Social History  . Marital status: Married    Spouse name:  N/A  . Number of children: N/A  . Years of education: N/A   Occupational History  . Not on file.   Social History Main Topics  . Smoking status: Never Smoker  . Smokeless tobacco: Never Used  . Alcohol use 0.0 oz/week     Comment: rare  . Drug use: No  . Sexual activity: Yes    Partners: Male   Other Topics Concern  . Not on file   Social History Narrative  . No narrative on file     Current Outpatient Prescriptions:  .  amiodarone (PACERONE) 200 MG tablet, Take 0.5 tablets (100 mg total) by mouth  at bedtime., Disp: 30 tablet, Rfl: 0 .  aspirin EC 81 MG tablet, Take 81 mg by mouth daily., Disp: , Rfl:  .  Azelaic Acid (FINACEA) 15 % cream, Apply 1 application topically 2 (two) times daily as needed. For rosacea, Disp: , Rfl:  .  Calcium Carbonate-Vitamin D (CALTRATE 600+D) 600-400 MG-UNIT per tablet, Take 1 tablet by mouth daily. , Disp: , Rfl:  .  Clindamycin Phosphate foam, as needed. FOR BACK, Disp: , Rfl: 5 .  fluconazole (DIFLUCAN) 150 MG tablet, Take 1 tablet (150 mg total) by mouth every other day., Disp: 3 tablet, Rfl: 0 .  gabapentin (NEURONTIN) 100 MG capsule, TAKE ONE CAPSULE BY MOUTH TWO TIMES DAILY, Disp: 60 capsule, Rfl: 2 .  HYDROcodone-acetaminophen (NORCO) 5-325 MG tablet, Take 1-2 tablets by mouth every 4 (four) hours as needed for moderate pain., Disp: 30 tablet, Rfl: 0 .  magnesium oxide (MAG-OX) 400 MG tablet, Take 400 mg by mouth every morning. , Disp: , Rfl:  .  metaxalone (SKELAXIN) 800 MG tablet, Take 1 tablet (800 mg total) by mouth at bedtime as needed for muscle spasms., Disp: 90 tablet, Rfl: 5 .  metoprolol tartrate (LOPRESSOR) 25 MG tablet, Take 25 mg by mouth as needed., Disp: , Rfl:  .  mometasone (NASONEX) 50 MCG/ACT nasal spray, Place 2 sprays into the nose every morning. , Disp: , Rfl:  .  naratriptan (AMERGE) 2.5 MG tablet, Take 1 tablet (2.5 mg total) by mouth as needed. (Patient taking differently: Take 2.5 mg by mouth as needed for migraine. ), Disp: 9 tablet, Rfl: 5 .  Probiotic Product (PROBIOTIC PO), Take 1 capsule by mouth daily. , Disp: , Rfl:  .  propranolol (INDERAL) 10 MG tablet, Take 1 tablet (10 mg total) by mouth 3 (three) times daily as needed (palpitations)., Disp: 90 tablet, Rfl: 4 .  ranitidine (ZANTAC) 150 MG capsule, Take 150 mg by mouth as needed for heartburn., Disp: , Rfl:  .  tamoxifen (NOLVADEX) 20 MG tablet, Take 1 tablet (20 mg total) by mouth daily., Disp: 90 tablet, Rfl: 3 .  tamsulosin (FLOMAX) 0.4 MG CAPS capsule, Take 0.8 mg  by mouth daily after breakfast. , Disp: , Rfl:  .  venlafaxine XR (EFFEXOR-XR) 75 MG 24 hr capsule, Take 1 capsule (75 mg total) by mouth daily with breakfast., Disp: 30 capsule, Rfl: 0  Allergies  Allergen Reactions  . Adhesive [Tape] Dermatitis    SKIN TURNS RED-PAPER TAPE OK TO USE  . Levaquin  [Levofloxacin]   . Macrodantin [Nitrofurantoin] Nausea And Vomiting  . Metronidazole Nausea Only  . Nitrofurantoin Macrocrystal Nausea And Vomiting  . Sulfamethoxazole-Trimethoprim Nausea Only  . Cephalexin Rash  . Clindamycin Hcl Rash  . Doxycycline Swelling and Rash  . Erythromycin Rash     ROS  Ten systems reviewed and is negative  except as mentioned in HPI   Objective  Vitals:   11/25/15 1027  BP: (!) 110/58  Pulse: 80  Resp: 16  Temp: 98.1 F (36.7 C)  TempSrc: Oral  SpO2: 97%  Weight: 209 lb 3.2 oz (94.9 kg)  Height: 5\' 6"  (1.676 m)    Body mass index is 33.77 kg/m.  Physical Exam  Constitutional: Patient appears well-developed and well-nourished. Obese No distress.  HEENT: head atraumatic, normocephalic, pupils equal and reactive to light, neck supple, throat within normal limits Cardiovascular: Normal rate, regular rhythm and normal heart sounds.  No murmur heard. No BLE edema. Pulmonary/Chest: Effort normal and breath sounds normal. No respiratory distress. Abdominal: Soft.  There is no tenderness. Psychiatric: Patient has a normal mood and affect. behavior is normal. Judgment and thought content normal.      PHQ2/9: Depression screen [facility] 2/9 11/25/2015 10/14/2015 09/24/2015 06/02/2015 03/04/2015  Decreased Interest 0 0 0 0 0  Down, Depressed, Hopeless 0 0 0 0 0  PHQ - 2 Score 0 0 0 0 0     Fall Risk: Fall Risk  11/25/2015 10/14/2015 09/24/2015 06/02/2015 03/04/2015  Falls in the past year? Yes No No No No  Number falls in past yr: 1 - - - -  Injury with Fall? No - - - -    Functional Status Survey: Is the patient deaf or have difficulty hearing?:  No Does the patient have difficulty seeing, even when wearing glasses/contacts?: No Does the patient have difficulty concentrating, remembering, or making decisions?: No Does the patient have difficulty walking or climbing stairs?: No Does the patient have difficulty dressing or bathing?: No Does the patient have difficulty doing errands alone such as visiting a doctor's office or shopping?: No    Assessment & Plan    1. Mild major depression (HCC)  - venlafaxine XR (EFFEXOR-XR) 75 MG 24 hr capsule; Take 1 capsule (75 mg total) by mouth daily with breakfast.  Dispense: 30 capsule; Refill: 0  Doing better, discussed mindfulness.

## 2015-11-26 ENCOUNTER — Other Ambulatory Visit: Payer: Self-pay | Admitting: Family Medicine

## 2015-11-26 DIAGNOSIS — G43009 Migraine without aura, not intractable, without status migrainosus: Secondary | ICD-10-CM

## 2015-11-26 NOTE — Telephone Encounter (Signed)
Patient requesting refill of Amerge.

## 2015-12-03 ENCOUNTER — Encounter: Payer: Self-pay | Admitting: Radiation Oncology

## 2015-12-03 ENCOUNTER — Ambulatory Visit
Admission: RE | Admit: 2015-12-03 | Discharge: 2015-12-03 | Disposition: A | Discharge status: Home / Self Care | Payer: BLUE CROSS/BLUE SHIELD | Source: Ambulatory Visit | Attending: Radiation Oncology | Admitting: Radiation Oncology

## 2015-12-03 ENCOUNTER — Ambulatory Visit: Payer: BLUE CROSS/BLUE SHIELD | Admitting: Family Medicine

## 2015-12-03 VITALS — BP 123/74 | HR 63 | Temp 98.0°F | Resp 18 | Wt 207.3 lb

## 2015-12-03 DIAGNOSIS — Z79810 Long term (current) use of selective estrogen receptor modulators (SERMs): Secondary | ICD-10-CM | POA: Insufficient documentation

## 2015-12-03 DIAGNOSIS — D0512 Intraductal carcinoma in situ of left breast: Secondary | ICD-10-CM | POA: Diagnosis present

## 2015-12-03 DIAGNOSIS — Z923 Personal history of irradiation: Secondary | ICD-10-CM | POA: Diagnosis not present

## 2015-12-03 DIAGNOSIS — Z170 Estrogen receptor positive status [ER+]: Secondary | ICD-10-CM | POA: Diagnosis not present

## 2015-12-03 DIAGNOSIS — C50412 Malignant neoplasm of upper-outer quadrant of left female breast: Secondary | ICD-10-CM

## 2015-12-03 NOTE — Progress Notes (Signed)
Radiation Oncology Follow up Note  Name: Sonya Lopez   Date:   12/03/2015 MRN:  [id number] DOB: [birth year]    This 60 y.o. female presents to the clinic today for one-month follow-up status post accelerated partial breast irradiation to her left breast for ductal carcinoma in situ.  REFERRING PROVIDER: Steele Sizer, MD  HPI: Patient is a 60 year old female now seen out 1 month having completed accelerated partial breast radiation to her left breast for ductal carcinoma in situ high-grade. Seen today in routine follow-up she is doing well. She specifically denies breast tenderness cough or bone pain. She's been started on tamoxifen and is tolerating that well without side effect..  COMPLICATIONS OF TREATMENT: none  FOLLOW UP COMPLIANCE: keeps appointments   PHYSICAL EXAM:  BP 123/74   Pulse 63   Temp 98 F (36.7 C)   Resp 18   Wt 207 lb 5.5 oz (94 kg)   BMI 33.47 kg/m  Lungs are clear to A&P cardiac examination essentially unremarkable with regular rate and rhythm. No dominant mass or nodularity is noted in right breast breast in 2 positions examined. Incision is well-healed. No axillary or supraclavicular adenopathy is appreciated. Cosmetic result is excellent. She does have some thickening in the lumpectomy site which we would expect from lumpectomy and high dose rate remote afterloading in the left breast with no other abnormality detected.. There is a small nodular density in her lumpectomy site measuring approximately 1 cm. Well-developed well-nourished patient in NAD. HEENT reveals PERLA, EOMI, discs not visualized.  Oral cavity is clear. No oral mucosal lesions are identified. Neck is clear without evidence of cervical or supraclavicular adenopathy. Lungs are clear to A&P. Cardiac examination is essentially unremarkable with regular rate and rhythm without murmur rub or thrill. Abdomen is benign with no organomegaly or masses noted. Motor sensory and DTR levels are equal and  symmetric in the upper and lower extremities. Cranial nerves II through XII are grossly intact. Proprioception is intact. No peripheral adenopathy or edema is identified. No motor or sensory levels are noted. Crude visual fields are within normal range.  RADIOLOGY RESULTS: No current films for review  PLAN: At the present time she is doing well 1 month out from accelerated partial breast radiation. I'm please were overall progress her cosmetic result is excellent. I've asked to see her back in 4-5 months for follow-up. She continues on tamoxifen without side effect. Patient knows to call with any concerns.  I would like to take this opportunity to thank you for allowing me to participate in the care of your patient.Armstead Peaks., MD

## 2015-12-04 NOTE — Progress Notes (Signed)
  Oncology Nurse Navigator Documentation  Navigator Location: [facility] (12/04/15 1500)   )Navigator Encounter Type: Telephone (12/04/15 1500)                     Patient Visit Type: Follow-up (12/04/15 1500)                              Time Spent with Patient: 15 (12/04/15 1500)  Patient had post mammosite visit with Dr. Baruch Gouty yesterday, and will follow-up with Dr. Bary Castilla next week.  States she is doing well on Tamoxifen, but just began noticing mild leg pain this. States she has been taking Tamoxifen x 3 weeks.  Encouraged to notify Dr. Bary Castilla if it does not improve.  She has a follow-up appointment with him on 12/09/15.

## 2015-12-05 ENCOUNTER — Encounter: Payer: Self-pay | Admitting: General Surgery

## 2015-12-09 ENCOUNTER — Encounter: Payer: Self-pay | Admitting: General Surgery

## 2015-12-09 ENCOUNTER — Ambulatory Visit (INDEPENDENT_AMBULATORY_CARE_PROVIDER_SITE_OTHER): Payer: BLUE CROSS/BLUE SHIELD | Admitting: General Surgery

## 2015-12-09 VITALS — BP 130/78 | HR 68 | Resp 12 | Ht 66.0 in | Wt 210.0 lb

## 2015-12-09 DIAGNOSIS — C50912 Malignant neoplasm of unspecified site of left female breast: Secondary | ICD-10-CM

## 2015-12-09 MED ORDER — ANASTROZOLE 1 MG PO TABS: 1.0000 mg | ORAL_TABLET | Freq: Every day | ORAL | 12 refills | Status: DC

## 2015-12-09 NOTE — Progress Notes (Signed)
Patient ID: Sonya Lopez, female   DOB: 06/20/1955, 60 y.o.   MRN: [id number]  Chief Complaint  Patient presents with  . Follow-up    lumpectomy    HPI Sonya Lopez is a 60 y.o. female here today for her follow up lumpectomy don eon 09/30/2015. Patient states she is having some side effect with her Tamoxifen, like leg pains ,pelvic pains and hot flashes.  The patient had been using tamoxifen for about 3 weeks when the right anterior lower leg discomfort became severe enough that she discontinue the medication. Since discontinuation the pelvic discomfort/heaviness and the pins and needle sensation on the anterior surface of the right lower leg has resolved.  The patient reports that she's done well with the change from Celexa to Effexor, this was undertaken with the idea that she would be making use of tamoxifen which is incompatible with ongoing Celexa use.  HPI  Past Medical History:  Diagnosis Date  . Allergy   . Anxiety    situational anxiety  . Arrhythmia    A-fib/A-flutter  . Atrial flutter ([place])   . Breast cancer ([place]) 09/2015   left breast cancer  . Breast neoplasm, Tis (DCIS), left 10/2015   ER 50%, PR 11-50%.  . Cancer ([place])   . Depression   . Difficult intubation 2009   [place] had to use  'boogee"  . Female cystocele 01/05/2011  . Fibromyalgia   . GERD (gastroesophageal reflux disease)   . Headache(784.0)   . Kidney stones   . Obesity   . PONV (postoperative nausea and vomiting)    h/o difficult intubation and anes had to use "boogee"    Past Surgical History:  Procedure Laterality Date  . ANTERIOR AND POSTERIOR REPAIR  11/30/2011   Procedure: ANTERIOR (CYSTOCELE) AND POSTERIOR REPAIR (RECTOCELE);  Surgeon: Reece Packer, MD;  Location: [facility];  Service: Urology;  Laterality: N/A;  with cysto  . BLADDER SUSPENSION  11/30/2011   Procedure: [facility] PROCEDURE;  Surgeon: Reece Packer, MD;  Location: [place] [facility];  Service:  Urology;  Laterality: N/A;  graft 10x6  . BREAST BIOPSY Right 2007   negative core  . BREAST LUMPECTOMY WITH NEEDLE LOCALIZATION Left 09/30/2015   Procedure: BREAST LUMPECTOMY WITH NEEDLE LOCALIZATION;  Surgeon: Robert Bellow, MD;  Location: [facility];  Service: General;  Laterality: Left;  . CESAREAN SECTION     x 1  . COLONOSCOPY  2009  . CYSTOSCOPY  11/30/2011   Procedure: CYSTOSCOPY;  Surgeon: Reece Packer, MD;  Location: [facility];  Service: Urology;;  . Brigitte Pulse AND CURETTAGE OF UTERUS  05/05/2007  . GALLBLADDER SURGERY  2004  . TONSILLECTOMY  1961  . VAGINAL HYSTERECTOMY  11/30/2011   Procedure: HYSTERECTOMY VAGINAL;  Surgeon: Emily Filbert, MD;  Location: [facility];  Service: Gynecology;  Laterality: N/A;    Family History  Problem Relation Age of Onset  . Diabetes Mother   . Hearing loss Mother   . Hypertension Mother   . Heart disease Father   . Heart disease Paternal Grandfather   . Asthma Daughter   . Asthma Daughter   . Migraines Daughter   . Fibromyalgia Daughter   . Interstitial cystitis Daughter   . Asthma Daughter   . Colitis Daughter   . Breast cancer Neg Hx     Social History Social History  Substance Use Topics  . Smoking status: Never Smoker  . Smokeless tobacco: Never Used  . Alcohol  use 0.0 oz/week     Comment: rare    Allergies  Allergen Reactions  . Adhesive [Tape] Dermatitis    SKIN TURNS RED-PAPER TAPE OK TO USE  . Levaquin  [Levofloxacin]   . Macrodantin [Nitrofurantoin] Nausea And Vomiting  . Metronidazole Nausea Only  . Nitrofurantoin Macrocrystal Nausea And Vomiting  . Sulfamethoxazole-Trimethoprim Nausea Only  . Cephalexin Rash  . Clindamycin Hcl Rash  . Doxycycline Swelling and Rash  . Erythromycin Rash    Current Outpatient Prescriptions  Medication Sig Dispense Refill  . amiodarone (PACERONE) 200 MG tablet Take 0.5 tablets (100 mg total) by mouth at bedtime. 30 tablet 0  . aspirin EC 81 MG tablet Take 81 mg by mouth daily.     . Azelaic Acid (FINACEA) 15 % cream Apply 1 application topically 2 (two) times daily as needed. For rosacea    . Calcium Carbonate-Vitamin D (CALTRATE 600+D) 600-400 MG-UNIT per tablet Take 1 tablet by mouth daily.     . Clindamycin Phosphate foam as needed. FOR BACK  5  . gabapentin (NEURONTIN) 100 MG capsule TAKE ONE CAPSULE BY MOUTH TWO TIMES DAILY 60 capsule 2  . magnesium oxide (MAG-OX) 400 MG tablet Take 400 mg by mouth every morning.     . metaxalone (SKELAXIN) 800 MG tablet Take 1 tablet (800 mg total) by mouth at bedtime as needed for muscle spasms. 90 tablet 5  . metoprolol tartrate (LOPRESSOR) 25 MG tablet Take 25 mg by mouth as needed.    . mometasone (NASONEX) 50 MCG/ACT nasal spray Place 2 sprays into the nose every morning.     . naratriptan (AMERGE) 2.5 MG tablet TAKE 1 TABLET BY MOUTH AS NEEDED 9 tablet 2  . Probiotic Product (PROBIOTIC PO) Take 1 capsule by mouth daily.     . propranolol (INDERAL) 10 MG tablet Take 1 tablet (10 mg total) by mouth 3 (three) times daily as needed (palpitations). 90 tablet 4  . ranitidine (ZANTAC) 150 MG capsule Take 150 mg by mouth as needed for heartburn.    . tamoxifen (NOLVADEX) 20 MG tablet Take 1 tablet (20 mg total) by mouth daily. 90 tablet 3  . tamsulosin (FLOMAX) 0.4 MG CAPS capsule Take 0.8 mg by mouth daily after breakfast.     . venlafaxine XR (EFFEXOR-XR) 75 MG 24 hr capsule Take 1 capsule (75 mg total) by mouth daily with breakfast. 30 capsule 0  . anastrozole (ARIMIDEX) 1 MG tablet Take 1 tablet (1 mg total) by mouth daily. 30 tablet 12   No current facility-administered medications for this visit.     Review of Systems Review of Systems  Constitutional: Negative.   Respiratory: Negative.   Cardiovascular: Negative.     Blood pressure 130/78, pulse 68, resp. rate 12, height 5\' 6"  (1.676 m), weight 210 lb (95.3 kg).  Physical Exam Physical Exam  Constitutional: She is oriented to person, place, and time. She appears  well-developed and well-nourished.  Eyes: Conjunctivae are normal. No scleral icterus.  Neck: Neck supple.  Cardiovascular: Normal rate, regular rhythm and normal heart sounds.   Pulmonary/Chest: Effort normal and breath sounds normal. Right breast exhibits no inverted nipple, no mass, no nipple discharge, no skin change and no tenderness. Left breast exhibits no inverted nipple, no mass, no nipple discharge, no skin change and no tenderness.    Slight thickening at the laterally edge left breast.  Musculoskeletal:       Legs: Lymphadenopathy:    She has no cervical adenopathy.  She has no axillary adenopathy.  Neurological: She is alert and oriented to person, place, and time.  Skin: Skin is warm and dry.    Data Reviewed  ER 50% DCIS.  Bone density completed 11/18/2015 showed normal femur and spine density.  Assessment    Poor tolerance of tamoxifen.n    Options for management at this time include 1) substitution of Aromasin for tamoxifen recognizing there is an increased risk of osteoporosis and the potential for muscle aches versus 2) no antiestrogen therapy.  At this time, we'll make use of a trial of Aromasin 1 mg daily and plan on a follow-up examination in one month to assess her tolerance of the medication. As she is doing well with Effexor, she did not want to switch back to Celexa.       This information has been scribed by Gaspar Cola CMA.  Robert Bellow 12/09/2015, 10:20 AM

## 2015-12-09 NOTE — Patient Instructions (Signed)
Return ion one month 

## 2015-12-15 ENCOUNTER — Encounter: Payer: Self-pay | Admitting: Family Medicine

## 2015-12-15 ENCOUNTER — Other Ambulatory Visit: Payer: Self-pay | Admitting: Family Medicine

## 2015-12-15 MED ORDER — DULOXETINE HCL 60 MG PO CPEP: 60.0000 mg | ORAL_CAPSULE | Freq: Every day | ORAL | 3 refills | Status: DC

## 2015-12-19 ENCOUNTER — Other Ambulatory Visit: Payer: Self-pay | Admitting: *Deleted

## 2015-12-29 ENCOUNTER — Encounter: Payer: Self-pay | Admitting: General Surgery

## 2016-01-02 ENCOUNTER — Telehealth: Payer: Self-pay | Admitting: Family Medicine

## 2016-01-02 ENCOUNTER — Other Ambulatory Visit: Payer: Self-pay

## 2016-01-02 ENCOUNTER — Other Ambulatory Visit: Payer: Self-pay | Admitting: Family Medicine

## 2016-01-02 DIAGNOSIS — E7849 Other hyperlipidemia: Secondary | ICD-10-CM

## 2016-01-02 DIAGNOSIS — Z79899 Other long term (current) drug therapy: Secondary | ICD-10-CM

## 2016-01-02 DIAGNOSIS — E119 Type 2 diabetes mellitus without complications: Secondary | ICD-10-CM

## 2016-01-02 NOTE — Telephone Encounter (Signed)
Done Please print out for patient

## 2016-01-02 NOTE — Telephone Encounter (Signed)
Pt has upcoming appt for 01/13/16 and is requesting to come in sooner for lab work.

## 2016-01-02 NOTE — Telephone Encounter (Signed)
Printed out and patient notified.

## 2016-01-05 LAB — COMPLETE METABOLIC PANEL WITH GFR
ALBUMIN: 3.7 g/dL (ref 3.6–5.1)
ALK PHOS: 93 U/L (ref 33–130)
ALT: 11 U/L (ref 6–29)
AST: 12 U/L (ref 10–35)
BUN: 20 mg/dL (ref 7–25)
CALCIUM: 9 mg/dL (ref 8.6–10.4)
CO2: 30 mmol/L (ref 20–31)
Chloride: 104 mmol/L (ref 98–110)
Creat: 0.9 mg/dL (ref 0.50–0.99)
GFR, EST NON AFRICAN AMERICAN: 69 mL/min (ref >=60)
GFR, Est African American: 79 mL/min (ref >=60)
Glucose, Bld: 90 mg/dL (ref 65–99)
POTASSIUM: 4.7 mmol/L (ref 3.5–5.3)
Sodium: 140 mmol/L (ref 135–146)
Total Bilirubin: 0.4 mg/dL (ref 0.2–1.2)
Total Protein: 6.3 g/dL (ref 6.1–8.1)

## 2016-01-05 LAB — CBC WITH DIFFERENTIAL/PLATELET
BASOS ABS: 0 {cells}/uL (ref 0–200)
Basophils Relative: 0 %
EOS PCT: 1 %
Eosinophils Absolute: 71 cells/uL (ref 15–500)
HEMATOCRIT: 40.4 % (ref 35.0–45.0)
HEMOGLOBIN: 13.1 g/dL (ref 11.7–15.5)
LYMPHS ABS: 994 {cells}/uL (ref 850–3900)
Lymphocytes Relative: 14 %
MCH: 31 pg (ref 27.0–33.0)
MCHC: 32.4 g/dL (ref 32.0–36.0)
MCV: 95.5 fL (ref 80.0–100.0)
MONO ABS: 426 {cells}/uL (ref 200–950)
MPV: 9.7 fL (ref 7.5–12.5)
Monocytes Relative: 6 %
NEUTROS PCT: 79 %
Neutro Abs: 5609 cells/uL (ref 1500–7800)
Platelets: 286 10*3/uL (ref 140–400)
RBC: 4.23 MIL/uL (ref 3.80–5.10)
RDW: 14.5 % (ref 11.0–15.0)
WBC: 7.1 10*3/uL (ref 3.8–10.8)

## 2016-01-05 LAB — HEMOGLOBIN A1C
HEMOGLOBIN A1C: 5.4 % (ref <5.7)
Mean Plasma Glucose: 108 mg/dL

## 2016-01-05 LAB — LIPID PANEL
CHOLESTEROL: 177 mg/dL (ref <200)
HDL: 57 mg/dL (ref >50)
LDL CALC: 105 mg/dL — AB (ref <100)
TRIGLYCERIDES: 76 mg/dL (ref <150)
Total CHOL/HDL Ratio: 3.1 Ratio (ref <5.0)
VLDL: 15 mg/dL (ref <30)

## 2016-01-07 ENCOUNTER — Ambulatory Visit: Payer: BLUE CROSS/BLUE SHIELD | Admitting: General Surgery

## 2016-01-08 ENCOUNTER — Encounter: Payer: Self-pay | Admitting: Cardiovascular Disease

## 2016-01-13 ENCOUNTER — Ambulatory Visit
Admission: RE | Admit: 2016-01-13 | Discharge: 2016-01-13 | Disposition: A | Discharge status: Home / Self Care | Payer: 59 | Source: Ambulatory Visit | Attending: Family Medicine | Admitting: Family Medicine

## 2016-01-13 ENCOUNTER — Other Ambulatory Visit: Payer: Self-pay | Admitting: Family Medicine

## 2016-01-13 ENCOUNTER — Encounter: Payer: Self-pay | Admitting: Family Medicine

## 2016-01-13 ENCOUNTER — Ambulatory Visit (INDEPENDENT_AMBULATORY_CARE_PROVIDER_SITE_OTHER): Payer: 59 | Admitting: Family Medicine

## 2016-01-13 ENCOUNTER — Ambulatory Visit: Admission: RE | Admit: 2016-01-13 | Payer: BLUE CROSS/BLUE SHIELD | Source: Ambulatory Visit

## 2016-01-13 ENCOUNTER — Ambulatory Visit (HOSPITAL_BASED_OUTPATIENT_CLINIC_OR_DEPARTMENT_OTHER): Admission: RE | Admit: 2016-01-13 | Payer: BLUE CROSS/BLUE SHIELD | Source: Ambulatory Visit

## 2016-01-13 VITALS — BP 122/68 | HR 78 | Temp 97.9°F | Resp 16 | Ht 66.0 in | Wt 209.9 lb

## 2016-01-13 DIAGNOSIS — I4892 Unspecified atrial flutter: Secondary | ICD-10-CM | POA: Diagnosis not present

## 2016-01-13 DIAGNOSIS — M898X6 Other specified disorders of bone, lower leg: Secondary | ICD-10-CM | POA: Diagnosis not present

## 2016-01-13 DIAGNOSIS — E782 Mixed hyperlipidemia: Secondary | ICD-10-CM

## 2016-01-13 DIAGNOSIS — Z853 Personal history of malignant neoplasm of breast: Secondary | ICD-10-CM

## 2016-01-13 DIAGNOSIS — M7731 Calcaneal spur, right foot: Secondary | ICD-10-CM | POA: Insufficient documentation

## 2016-01-13 DIAGNOSIS — M797 Fibromyalgia: Secondary | ICD-10-CM

## 2016-01-13 DIAGNOSIS — G43009 Migraine without aura, not intractable, without status migrainosus: Secondary | ICD-10-CM

## 2016-01-13 DIAGNOSIS — F320 Major depressive disorder, single episode, mild: Secondary | ICD-10-CM | POA: Diagnosis not present

## 2016-01-13 MED ORDER — METAXALONE 800 MG PO TABS: 800.0000 mg | ORAL_TABLET | Freq: Every evening | ORAL | 2 refills | Status: DC | PRN

## 2016-01-13 NOTE — Progress Notes (Signed)
Name: Sonya Lopez   MRN: [id number]    DOB: May 24, 1955   Date:01/13/2016       Progress Note  Subjective  Chief Complaint  Chief Complaint  Patient presents with  . Follow-up    6 week F/U  . Depression    Dr. Bary Castilla took her off Tamoxifen so patient could go back on Cymbalta. Patient states she is doing well on medication except for it making her sleepy if she still for too long. She has to be up moving around to stay away.    HPI  FMS: She states that Cymbalta and Gabapentin seems to be helping with symptoms. Pain is still daily, pain level is 6/10 on right anterior tibia area, but it is 4/10 mostly on hips and back, otherwise lower than that.   Atrial Flutter:  Episodes are stable with Amiodarone,  She is supposed to take Metoprolol and Inderal prn only but not recently.  HR goes up to 150's - seeing Dr. Rockey Situ. We will check TSH on her next visit  Migraine Headaches: episodes down to a few times a month usually on left maxillary area and radiates to left forehead and down to left side of neck, Axert  kicks in within 40 minutes, sometimes associated with nausea, but no vomiting.   Mild Major Depression: feeling overwhelmed, over the past 62  months she has been under a lot of stress. Initially problems at her church - new pastor ( they now moved to another church in [place] ) and in November she started to home-schooling her grand-daughter Jillyn Ledger ) , daughter separated July 2017, they had to change church about one year ago.  She is on Cymbalta and states it is helping her deal with all the stress in her life, she is getting a little sleepy in am's if she does not take medication by 6 pm. Also advised to take Gabapentin earlier.   DMII: she is on diet only and hgbA1C is under control, urine micro was negative last visit. She denies polyphagia, no polyuria or polydipsia. Eye exam is up to date  GERD: under control with medication, she tries alternating Prilosec and  Zantac.  History of left breast cancer: she had lumpectomy, could not tolerate estrogen suppressive therapy.   Patient Active Problem List   Diagnosis Date Noted  . History of left breast cancer 09/16/2015  . Long-term use of high-risk medication 02/25/2015  . Increased urinary frequency 08/01/2014  . Fibrocystic breast disease 07/29/2014  . Mild major depression ([place]) 07/29/2014  . Gastric reflux 07/29/2014  . Chronic interstitial cystitis 07/29/2014  . Calculus of kidney 07/29/2014  . Allergic rhinitis 07/29/2014  . Acne erythematosa 07/29/2014  . Lumbosacral radiculitis 07/29/2014  . Tinnitus 07/29/2014  . Mixed incontinence, urge and stress (female) (female) 08/23/2013  . Hyperlipidemia 04/25/2013  . H/O: hysterectomy 11/30/2011  . Atrial flutter ([place]) 05/03/2011  . Diet-controlled type 2 diabetes mellitus ([place]) 04/16/2011  . Bladder cystocele 01/05/2011  . Migraine without aura and without status migrainosus, not intractable 12/29/2010  . Fibromyalgia 12/29/2010  . Obesity 12/29/2010    Past Surgical History:  Procedure Laterality Date  . ANTERIOR AND POSTERIOR REPAIR  11/30/2011   Procedure: ANTERIOR (CYSTOCELE) AND POSTERIOR REPAIR (RECTOCELE);  Surgeon: Reece Packer, MD;  Location: [facility];  Service: Urology;  Laterality: N/A;  with cysto  . BLADDER SUSPENSION  11/30/2011   Procedure: [facility] PROCEDURE;  Surgeon: Reece Packer, MD;  Location: [facility];  Service: Urology;  Laterality: N/A;  graft 10x6  . BREAST BIOPSY Right 2007   negative core  . BREAST LUMPECTOMY WITH NEEDLE LOCALIZATION Left 09/30/2015   Procedure: BREAST LUMPECTOMY WITH NEEDLE LOCALIZATION;  Surgeon: Robert Bellow, MD;  Location: [facility];  Service: General;  Laterality: Left;  . CESAREAN SECTION     x 1  . COLONOSCOPY  2009  . CYSTOSCOPY  11/30/2011   Procedure: CYSTOSCOPY;  Surgeon: Reece Packer, MD;  Location: [facility];  Service: Urology;;  . Brigitte Pulse AND CURETTAGE OF UTERUS   05/05/2007  . GALLBLADDER SURGERY  2004  . TONSILLECTOMY  1961  . VAGINAL HYSTERECTOMY  11/30/2011   Procedure: HYSTERECTOMY VAGINAL;  Surgeon: Emily Filbert, MD;  Location: [facility];  Service: Gynecology;  Laterality: N/A;    Family History  Problem Relation Age of Onset  . Diabetes Mother   . Hearing loss Mother   . Hypertension Mother   . Heart disease Father   . Heart disease Paternal Grandfather   . Asthma Daughter   . Asthma Daughter   . Migraines Daughter   . Fibromyalgia Daughter   . Interstitial cystitis Daughter   . Asthma Daughter   . Colitis Daughter   . Breast cancer Neg Hx     Social History   Social History  . Marital status: Married    Spouse name: N/A  . Number of children: N/A  . Years of education: N/A   Occupational History  . Not on file.   Social History Main Topics  . Smoking status: Never Smoker  . Smokeless tobacco: Never Used  . Alcohol use 0.0 oz/week     Comment: rare  . Drug use: No  . Sexual activity: Yes    Partners: Male   Other Topics Concern  . Not on file   Social History Narrative  . No narrative on file     Current Outpatient Prescriptions:  .  amiodarone (PACERONE) 200 MG tablet, Take 0.5 tablets (100 mg total) by mouth at bedtime., Disp: 30 tablet, Rfl: 0 .  aspirin EC 81 MG tablet, Take 81 mg by mouth daily., Disp: , Rfl:  .  Azelaic Acid (FINACEA) 15 % cream, Apply 1 application topically 2 (two) times daily as needed. For rosacea, Disp: , Rfl:  .  Calcium Carbonate-Vitamin D (CALTRATE 600+D) 600-400 MG-UNIT per tablet, Take 1 tablet by mouth daily. , Disp: , Rfl:  .  Clindamycin Phosphate foam, Apply 1 Units topically as needed. For rosacea  , Disp: , Rfl: 5 .  DULoxetine (CYMBALTA) 60 MG capsule, Take 1 capsule (60 mg total) by mouth daily. In place of Effexor, Disp: 30 capsule, Rfl: 3 .  gabapentin (NEURONTIN) 100 MG capsule, TAKE ONE CAPSULE BY MOUTH TWO TIMES DAILY, Disp: 60 capsule, Rfl: 2 .  magnesium oxide (MAG-OX)  400 MG tablet, Take 400 mg by mouth every morning. , Disp: , Rfl:  .  metaxalone (SKELAXIN) 800 MG tablet, Take 1 tablet (800 mg total) by mouth at bedtime as needed for muscle spasms., Disp: 90 tablet, Rfl: 2 .  metoprolol tartrate (LOPRESSOR) 25 MG tablet, Take 25 mg by mouth as needed., Disp: , Rfl:  .  mometasone (NASONEX) 50 MCG/ACT nasal spray, Place 2 sprays into the nose every morning. , Disp: , Rfl:  .  naratriptan (AMERGE) 2.5 MG tablet, TAKE 1 TABLET BY MOUTH AS NEEDED, Disp: 9 tablet, Rfl: 2 .  Probiotic Product (PROBIOTIC PO), Take 1 capsule by mouth daily. ,  Disp: , Rfl:  .  propranolol (INDERAL) 10 MG tablet, Take 1 tablet (10 mg total) by mouth 3 (three) times daily as needed (palpitations)., Disp: 90 tablet, Rfl: 4 .  ranitidine (ZANTAC) 150 MG capsule, Take 150 mg by mouth as needed for heartburn., Disp: , Rfl:  .  tamsulosin (FLOMAX) 0.4 MG CAPS capsule, Take 0.8 mg by mouth daily after breakfast. , Disp: , Rfl:   Allergies  Allergen Reactions  . Adhesive [Tape] Dermatitis    SKIN TURNS RED-PAPER TAPE OK TO USE  . Levaquin  [Levofloxacin]   . Macrodantin [Nitrofurantoin] Nausea And Vomiting  . Metronidazole Nausea Only  . Nitrofurantoin Macrocrystal Nausea And Vomiting  . Sulfamethoxazole-Trimethoprim Nausea Only  . Cephalexin Rash  . Clindamycin Hcl Rash  . Doxycycline Swelling and Rash  . Erythromycin Rash     ROS  Constitutional: Negative for fever or weight change.  Respiratory: Negative for cough and shortness of breath.   Cardiovascular: Negative for chest pain or palpitations.  Gastrointestinal: Negative for abdominal pain, no bowel changes.  Musculoskeletal: Negative for gait problem or joint swelling.  Skin: Negative for rash.  Neurological: Negative for dizziness, positive for headache.  No other specific complaints in a complete review of systems (except as listed in HPI above).  Objective  Vitals:   01/13/16 1034  BP: 122/68  Pulse: 78  Resp:  16  Temp: 97.9 F (36.6 C)  TempSrc: Oral  SpO2: 94%  Weight: 209 lb 14.4 oz (95.2 kg)  Height: _0  (1.676 m)    Body mass index is 33.88 kg/m.  Physical Exam  Constitutional: Patient appears well-developed and well-nourished. Obese  No distress.  HEENT: head atraumatic, normocephalic, pupils equal and reactive to light,  neck supple, throat within normal limits Cardiovascular: Normal rate, regular rhythm and normal heart sounds.  No murmur heard. No BLE edema. Pulmonary/Chest: Effort normal and breath sounds normal. No respiratory distress. Abdominal: Soft.  There is no tenderness. Psychiatric: Patient has a normal mood and affect. behavior is normal. Judgment and thought content normal. Trigger point positive, also has pain on right anterior tibia  Recent Results (from the past 2160 hour(s))  CBC with Differential/Platelet     Status: None   Collection Time: 01/05/16  8:28 AM  Result Value Ref Range   WBC 7.1 3.8 - 10.8 K/uL   RBC 4.23 3.80 - 5.10 MIL/uL   Hemoglobin 13.1 11.7 - 15.5 g/dL   HCT 40.4 35.0 - 45.0 %   MCV 95.5 80.0 - 100.0 fL   MCH 31.0 27.0 - 33.0 pg   MCHC 32.4 32.0 - 36.0 g/dL   RDW 14.5 11.0 - 15.0 %   Platelets 286 140 - 400 K/uL   MPV 9.7 7.5 - 12.5 fL   Neutro Abs 5,609 1,500 - 7,800 cells/uL   Lymphs Abs 994 850 - 3,900 cells/uL   Monocytes Absolute 426 200 - 950 cells/uL   Eosinophils Absolute 71 15 - 500 cells/uL   Basophils Absolute 0 0 - 200 cells/uL   Neutrophils Relative % 79 %   Lymphocytes Relative 14 %   Monocytes Relative 6 %   Eosinophils Relative 1 %   Basophils Relative 0 %   Smear Review Criteria for review not met   COMPLETE METABOLIC PANEL WITH GFR     Status: None   Collection Time: 01/05/16  8:28 AM  Result Value Ref Range   Sodium 140 135 - 146 mmol/L   Potassium 4.7 3.5 -  5.3 mmol/L   Chloride 104 98 - 110 mmol/L   CO2 30 20 - 31 mmol/L   Glucose, Bld 90 65 - 99 mg/dL   BUN 20 7 - 25 mg/dL   Creat 0.90 0.50 - 0.99  mg/dL    Comment:   For patients > or = 62 years of age: The upper reference limit for Creatinine is approximately 13% higher for people identified as African-American.      Total Bilirubin 0.4 0.2 - 1.2 mg/dL   Alkaline Phosphatase 93 33 - 130 U/L   AST 12 10 - 35 U/L   ALT 11 6 - 29 U/L   Total Protein 6.3 6.1 - 8.1 g/dL   Albumin 3.7 3.6 - 5.1 g/dL   Calcium 9.0 8.6 - 10.4 mg/dL   GFR, Est African American 79 >=60 mL/min   GFR, Est Non African American 69 >=60 mL/min  Hemoglobin A1c     Status: None   Collection Time: 01/05/16  8:28 AM  Result Value Ref Range   Hgb A1c MFr Bld 5.4 <5.7 %    Comment:   For the purpose of screening for the presence of diabetes:   <5.7%       Consistent with the absence of diabetes 5.7-6.4 %   Consistent with increased risk for diabetes (prediabetes) >=6.5 %     Consistent with diabetes   This assay result is consistent with a decreased risk of diabetes.   Currently, no consensus exists regarding use of hemoglobin A1c for diagnosis of diabetes in children.   According to American Diabetes Association (ADA) guidelines, hemoglobin A1c <7.0% represents optimal control in non-pregnant diabetic patients. Different metrics may apply to specific patient populations. Standards of Medical Care in Diabetes (ADA).      Mean Plasma Glucose 108 mg/dL  Lipid panel     Status: Abnormal   Collection Time: 01/05/16  8:28 AM  Result Value Ref Range   Cholesterol 177 <200 mg/dL    Comment: ** Please note change in reference range(s). **      Triglycerides 76 <150 mg/dL    Comment: ** Please note change in reference range(s). **      HDL 57 >50 mg/dL    Comment: ** Please note change in reference range(s). **      Total CHOL/HDL Ratio 3.1 <5.0 Ratio   VLDL 15 <30 mg/dL   LDL Cholesterol 105 (H) <100 mg/dL    Comment: ** Please note change in reference range(s). **       Diabetic Foot Exam: Diabetic Foot Exam - Simple   Simple Foot  Form Diabetic Foot exam was performed with the following findings:  Yes 01/13/2016 11:44 AM  Visual Inspection No deformities, no ulcerations, no other skin breakdown bilaterally:  Yes Sensation Testing Intact to touch and monofilament testing bilaterally:  Yes Pulse Check Posterior Tibialis and Dorsalis pulse intact bilaterally:  Yes Comments      PHQ2/9: Depression screen [facility] 2/9 12/03/2015 11/25/2015 10/14/2015 09/24/2015 06/02/2015  Decreased Interest 0 0 0 0 0  Down, Depressed, Hopeless 0 0 0 0 0  PHQ - 2 Score 0 0 0 0 0     Fall Risk: Fall Risk  11/25/2015 10/14/2015 09/24/2015 06/02/2015 03/04/2015  Falls in the past year? Yes No No No No  Number falls in past yr: 1 - - - -  Injury with Fall? No - - - -     Assessment & Plan   1. Mild major  depression ([place])  Continue medication, return sooner if needed  2. Migraine without aura and without status migrainosus, not intractable  Continue medication   3. Fibromyalgia  - metaxalone (SKELAXIN) 800 MG tablet; Take 1 tablet (800 mg total) by mouth at bedtime as needed for muscle spasms.  Dispense: 90 tablet; Refill: 2  4. Atrial flutter, unspecified type ([place])  Doing well at this time  5. Mixed hyperlipidemia  Reviewed labs, doing well   6. History of left breast cancer  Keep follow up with Dr. Fleet Contras and Dr. Baruch Gouty  7. Pain of right tibia  - XR Tibia/Fibula Right

## 2016-01-20 ENCOUNTER — Other Ambulatory Visit: Payer: Self-pay | Admitting: Family Medicine

## 2016-01-20 ENCOUNTER — Encounter: Payer: Self-pay | Admitting: Family Medicine

## 2016-01-20 MED ORDER — SULFAMETHOXAZOLE-TRIMETHOPRIM 800-160 MG PO TABS: 1.0000 | ORAL_TABLET | Freq: Two times a day (BID) | ORAL | 0 refills | Status: DC

## 2016-04-13 ENCOUNTER — Telehealth: Payer: BLUE CROSS/BLUE SHIELD | Admitting: Family

## 2016-04-13 DIAGNOSIS — Z1283 Encounter for screening for malignant neoplasm of skin: Secondary | ICD-10-CM | POA: Diagnosis not present

## 2016-04-13 DIAGNOSIS — B9689 Other specified bacterial agents as the cause of diseases classified elsewhere: Secondary | ICD-10-CM

## 2016-04-13 DIAGNOSIS — J019 Acute sinusitis, unspecified: Secondary | ICD-10-CM

## 2016-04-13 DIAGNOSIS — L718 Other rosacea: Secondary | ICD-10-CM | POA: Diagnosis not present

## 2016-04-13 DIAGNOSIS — D485 Neoplasm of uncertain behavior of skin: Secondary | ICD-10-CM | POA: Diagnosis not present

## 2016-04-13 MED ORDER — AMOXICILLIN 500 MG PO TABS: 1000.0000 mg | ORAL_TABLET | Freq: Two times a day (BID) | ORAL | 0 refills | Status: DC

## 2016-04-13 NOTE — Progress Notes (Signed)
We are sorry that you are not feeling well.  Here is how we plan to help!  Based on what you have shared with me it looks like you have sinusitis.  Sinusitis is inflammation and infection in the sinus cavities of the head.  Based on your presentation I believe you most likely have Acute Bacterial Sinusitis.  This is an infection caused by bacteria and is treated with antibiotics. I have prescribed Amoxil. You may use an oral decongestant such as Mucinex D or if you have glaucoma or high blood pressure use plain Mucinex. Saline nasal spray help and can safely be used as often as needed for congestion.  If you develop worsening sinus pain, fever or notice severe headache and vision changes, or if symptoms are not better after completion of antibiotic, please schedule an appointment with a health care provider.    Sinus infections are not as easily transmitted as other respiratory infection, however we still recommend that you avoid close contact with loved ones, especially the very young and elderly.  Remember to wash your hands thoroughly throughout the day as this is the number one way to prevent the spread of infection!  Home Care:  Only take medications as instructed by your medical team.  Complete the entire course of an antibiotic.  Do not take these medications with alcohol.  A steam or ultrasonic humidifier can help congestion.  You can place a towel over your head and breathe in the steam from hot water coming from a faucet.  Avoid close contacts especially the very young and the elderly.  Cover your mouth when you cough or sneeze.  Always remember to wash your hands.  Get Help Right Away If:  You develop worsening fever or sinus pain.  You develop a severe head ache or visual changes.  Your symptoms persist after you have completed your treatment plan.  Make sure you  Understand these instructions.  Will watch your condition.  Will get help right away if you are not doing  well or get worse.  Your e-visit answers were reviewed by a board certified advanced clinical practitioner to complete your personal care plan.  Depending on the condition, your plan could have included both over the counter or prescription medications.  If there is a problem please reply  once you have received a response from your provider.  Your safety is important to [name].  If you have drug allergies check your prescription carefully.    You can use MyChart to ask questions about today's visit, request a non-urgent call back, or ask for a work or school excuse for 24 hours related to this e-Visit. If it has been greater than 24 hours you will need to follow up with your provider, or enter a new e-Visit to address those concerns.  You will get an e-mail in the next two days asking about your experience.  I hope that your e-visit has been valuable and will speed your recovery. Thank you for using e-visits.

## 2016-04-19 ENCOUNTER — Other Ambulatory Visit: Payer: Self-pay | Admitting: Cardiovascular Disease

## 2016-04-19 NOTE — Telephone Encounter (Signed)
Is this okay to refill even though Dr. Rockey Situ did not prescribe

## 2016-04-28 ENCOUNTER — Ambulatory Visit (INDEPENDENT_AMBULATORY_CARE_PROVIDER_SITE_OTHER): Payer: 59 | Admitting: Family Medicine

## 2016-04-28 ENCOUNTER — Encounter: Payer: Self-pay | Admitting: Family Medicine

## 2016-04-28 VITALS — BP 108/62 | HR 79 | Temp 97.9°F | Resp 18 | Ht 66.0 in | Wt 215.4 lb

## 2016-04-28 DIAGNOSIS — R5383 Other fatigue: Secondary | ICD-10-CM

## 2016-04-28 DIAGNOSIS — R0683 Snoring: Secondary | ICD-10-CM

## 2016-04-28 DIAGNOSIS — M62838 Other muscle spasm: Secondary | ICD-10-CM

## 2016-04-28 DIAGNOSIS — M7062 Trochanteric bursitis, left hip: Secondary | ICD-10-CM | POA: Diagnosis not present

## 2016-04-28 LAB — VITAMIN B12: VITAMIN B 12: 372 pg/mL (ref 200–1100)

## 2016-04-28 MED ORDER — TRIAMCINOLONE ACETONIDE 40 MG/ML IJ SUSP
40.0000 mg | Freq: Once | INTRAMUSCULAR | Status: AC
  Administered 2016-04-28: 40 mg via INTRAMUSCULAR

## 2016-04-28 MED ORDER — TRIAMCINOLONE ACETONIDE 40 MG/ML IJ SUSP: 40.0000 mg | Freq: Once | INTRAMUSCULAR | Status: DC

## 2016-04-28 MED ORDER — LIDOCAINE HCL (PF) 1 % IJ SOLN
2.0000 mL | Freq: Once | INTRAMUSCULAR | Status: AC
  Administered 2016-04-28: 2 mL via INTRADERMAL

## 2016-04-28 NOTE — Progress Notes (Signed)
Name: Sonya Lopez   MRN: [id number]    DOB: [birth year]   Date:04/28/2016       Progress Note  Subjective  Chief Complaint  Chief Complaint  Patient presents with  . Hip Pain    Onset-couple of months and has been treating her pain with massages, chiropractor, bio-freeze and ibuprofen. Also experiencing neck pain with left hip pain.      HPI  Hip pain: Sonya Lopez has a long history of trochanteric bursitis, but states she has been feeling more stiff lately. She states left outer hip is the most painful, but the right side is also bothering her. She has tried going to chiropractor, otc medication without much help. She has steroid injections in the past with good response.   Left side neck/shoulder pain: she has been seeing chiropractor but has pain on left shoulder, left side of neck, mild decrease in abduction of left shoulder because of pain on lateral aspect. No rashes.   Fatigue:  She has been feeling extremely tired for months, she is very concerned about the need to take naps, and difficulty staying awake while driving. She also snores. Labs from Dec were unremarkable, she has no change in stress level. Discussed sleep apnea, hypothyroidism, vitamin deficiency, the fact that she was treated for cancer last year and may just now be able to realize what happened, depression, or even season affective disorder. We will check labs, sleep study and follow up in one month   Patient Active Problem List   Diagnosis Date Noted  . History of left breast cancer 09/16/2015  . Long-term use of high-risk medication 02/25/2015  . Increased urinary frequency 08/01/2014  . Fibrocystic breast disease 07/29/2014  . Mild major depression ([place]) 07/29/2014  . Gastric reflux 07/29/2014  . Chronic interstitial cystitis 07/29/2014  . Calculus of kidney 07/29/2014  . Allergic rhinitis 07/29/2014  . Acne erythematosa 07/29/2014  . Lumbosacral radiculitis 07/29/2014  . Tinnitus 07/29/2014  . Mixed  incontinence, urge and stress (female) (female) 08/23/2013  . Hyperlipidemia 04/25/2013  . H/O: hysterectomy 11/30/2011  . Atrial flutter ([place]) 05/03/2011  . Diet-controlled type 2 diabetes mellitus ([place]) 04/16/2011  . Bladder cystocele 01/05/2011  . Migraine without aura and without status migrainosus, not intractable 12/29/2010  . Fibromyalgia 12/29/2010  . Obesity 12/29/2010    Past Surgical History:  Procedure Laterality Date  . ANTERIOR AND POSTERIOR REPAIR  11/30/2011   Procedure: ANTERIOR (CYSTOCELE) AND POSTERIOR REPAIR (RECTOCELE);  Surgeon: Reece Packer, MD;  Location: [facility];  Service: Urology;  Laterality: N/A;  with cysto  . BLADDER SUSPENSION  11/30/2011   Procedure: [facility] PROCEDURE;  Surgeon: Reece Packer, MD;  Location: [facility];  Service: Urology;  Laterality: N/A;  graft 10x6  . BREAST BIOPSY Right 2007   negative core  . BREAST LUMPECTOMY WITH NEEDLE LOCALIZATION Left 09/30/2015   Procedure: BREAST LUMPECTOMY WITH NEEDLE LOCALIZATION;  Surgeon: Robert Bellow, MD;  Location: [facility];  Service: General;  Laterality: Left;  . CESAREAN SECTION     x 1  . COLONOSCOPY  2009  . CYSTOSCOPY  11/30/2011   Procedure: CYSTOSCOPY;  Surgeon: Reece Packer, MD;  Location: [facility];  Service: Urology;;  . Brigitte Pulse AND CURETTAGE OF UTERUS  05/05/2007  . GALLBLADDER SURGERY  2004  . TONSILLECTOMY  1961  . VAGINAL HYSTERECTOMY  11/30/2011   Procedure: HYSTERECTOMY VAGINAL;  Surgeon: Emily Filbert, MD;  Location: [facility];  Service: Gynecology;  Laterality: N/A;  Family History  Problem Relation Age of Onset  . Diabetes Mother   . Hearing loss Mother   . Hypertension Mother   . Heart disease Father   . Heart disease Paternal Grandfather   . Asthma Daughter   . Asthma Daughter   . Migraines Daughter   . Fibromyalgia Daughter   . Interstitial cystitis Daughter   . Asthma Daughter   . Colitis Daughter   . Breast cancer Neg Hx     Social History   Social  History  . Marital status: Married    Spouse name: N/A  . Number of children: N/A  . Years of education: N/A   Occupational History  . Not on file.   Social History Main Topics  . Smoking status: Never Smoker  . Smokeless tobacco: Never Used  . Alcohol use 0.0 oz/week     Comment: rare  . Drug use: No  . Sexual activity: Yes    Partners: Male   Other Topics Concern  . Not on file   Social History Narrative  . No narrative on file     Current Outpatient Prescriptions:  .  amiodarone (PACERONE) 200 MG tablet, TAKE 1 TABLET TWICE A DAY AS NEEDED, Disp: 60 tablet, Rfl: 3 .  aspirin EC 81 MG tablet, Take 81 mg by mouth daily., Disp: , Rfl:  .  Azelaic Acid (FINACEA) 15 % cream, Apply 1 application topically 2 (two) times daily as needed. For rosacea, Disp: , Rfl:  .  Calcium Carbonate-Vitamin D (CALTRATE 600+D) 600-400 MG-UNIT per tablet, Take 1 tablet by mouth daily. , Disp: , Rfl:  .  Clindamycin Phosphate foam, Apply 1 Units topically as needed. For rosacea  , Disp: , Rfl: 5 .  DULoxetine (CYMBALTA) 60 MG capsule, Take 1 capsule (60 mg total) by mouth daily. In place of Effexor, Disp: 30 capsule, Rfl: 3 .  FINACEA 15 % FOAM, , Disp: , Rfl:  .  gabapentin (NEURONTIN) 100 MG capsule, TAKE ONE CAPSULE BY MOUTH TWO TIMES DAILY, Disp: 60 capsule, Rfl: 2 .  magnesium oxide (MAG-OX) 400 MG tablet, Take 400 mg by mouth every morning. , Disp: , Rfl:  .  metaxalone (SKELAXIN) 800 MG tablet, Take 1 tablet (800 mg total) by mouth at bedtime as needed for muscle spasms., Disp: 90 tablet, Rfl: 2 .  metoprolol tartrate (LOPRESSOR) 25 MG tablet, Take 25 mg by mouth as needed., Disp: , Rfl:  .  mometasone (NASONEX) 50 MCG/ACT nasal spray, Place 2 sprays into the nose every morning. , Disp: , Rfl:  .  naratriptan (AMERGE) 2.5 MG tablet, TAKE 1 TABLET BY MOUTH AS NEEDED, Disp: 9 tablet, Rfl: 2 .  Probiotic Product (PROBIOTIC PO), Take 1 capsule by mouth daily. , Disp: , Rfl:  .  propranolol  (INDERAL) 10 MG tablet, Take 1 tablet (10 mg total) by mouth 3 (three) times daily as needed (palpitations)., Disp: 90 tablet, Rfl: 4 .  ranitidine (ZANTAC) 150 MG capsule, Take 150 mg by mouth as needed for heartburn., Disp: , Rfl:  .  tamsulosin (FLOMAX) 0.4 MG CAPS capsule, Take 0.8 mg by mouth daily after breakfast. , Disp: , Rfl:   Current Facility-Administered Medications:  .  triamcinolone acetonide (KENALOG-40) injection 40 mg, 40 mg, Intramuscular, Once, Steele Sizer, MD .  triamcinolone acetonide (KENALOG-40) injection 40 mg, 40 mg, Intramuscular, Once, Steele Sizer, MD  Allergies  Allergen Reactions  . Adhesive [Tape] Dermatitis    SKIN TURNS RED-PAPER TAPE OK TO  USE  . Levaquin  [Levofloxacin]   . Macrodantin [Nitrofurantoin] Nausea And Vomiting  . Metronidazole Nausea Only  . Nitrofurantoin Macrocrystal Nausea And Vomiting  . Sulfamethoxazole-Trimethoprim Nausea Only  . Cephalexin Rash  . Clindamycin Hcl Rash  . Doxycycline Swelling and Rash  . Erythromycin Rash     ROS   Constitutional: Negative for fever, positive for  weight change.  Respiratory: Negative for cough and shortness of breath.   Cardiovascular: Negative for chest pain or palpitations.  Gastrointestinal: Negative for abdominal pain, no bowel changes.  Musculoskeletal: Positive  for gait problem no  joint swelling.  Skin: Negative for rash.  Neurological: Negative for dizziness or headache.  No other specific complaints in a complete review of systems (except as listed in HPI above).  Objective  Vitals:   04/28/16 1441  BP: 108/62  Pulse: 79  Resp: 18  Temp: 97.9 F (36.6 C)  TempSrc: Oral  SpO2: 94%  Weight: 215 lb 6.4 oz (97.7 kg)  Height: 5\' 6"  (1.676 m)    Body mass index is 34.77 kg/m.  Physical Exam  Constitutional: Patient appears well-developed and well-nourished. Obese  No distress.  HEENT: head atraumatic, normocephalic, pupils equal and reactive to light,  neck supple,  throat within normal limits Cardiovascular: Normal rate, regular rhythm and normal heart sounds.  No murmur heard. No BLE edema. Pulmonary/Chest: Effort normal and breath sounds normal. No respiratory distress. Abdominal: Soft.  There is no tenderness. Psychiatric: Patient has a normal mood and affect. behavior is normal. Judgment and thought content normal. Muscular Skeletal: pain during palpation of both trochanteric bursas but worse on left side, pain during palpation of left biceps bursa, also pain during palpation of left trapezium muscle on left side, very tight muscle, normal rom of left shoulde  PHQ2/9: Depression screen [facility] 2/9 04/28/2016 12/03/2015 11/25/2015 10/14/2015 09/24/2015  Decreased Interest 0 0 0 0 0  Down, Depressed, Hopeless 0 0 0 0 0  PHQ - 2 Score 0 0 0 0 0     Fall Risk: Fall Risk  04/28/2016 11/25/2015 10/14/2015 09/24/2015 06/02/2015  Falls in the past year? Yes Yes No No No  Number falls in past yr: 1 1 - - -  Injury with Fall? No No - - -      Functional Status Survey: Is the patient deaf or have difficulty hearing?: No Does the patient have difficulty seeing, even when wearing glasses/contacts?: No Does the patient have difficulty concentrating, remembering, or making decisions?: No Does the patient have difficulty walking or climbing stairs?: No Does the patient have difficulty dressing or bathing?: No Does the patient have difficulty doing errands alone such as visiting a doctor's office or shopping?: No    Assessment & Plan  1. Muscle spasm  - triamcinolone acetonide (KENALOG-40) injection 40 mg; Inject 1 mL (40 mg total) into the muscle once.  Consent form signed Localized muscle group Injection with lidocaine 1% and Kenalog 40mg /1 ml on muscle  Patient tolerated procedure well No side effects  2. Trochanteric bursitis, left hip  - triamcinolone acetonide (KENALOG-40) injection 40 mg; Inject 1 mL (40 mg total) into the muscle once. Consent  form signed Localized bursa - left trochanteric bursa Area prepped with alcohol  Injection with lidocaine 1% and Kenalog 40mg /1 ml on bursa sack Patient tolerated procedure well No side effects  3. Other fatigue  - Vitamin B12 - VITAMIN D 25 Hydroxy (Vit-D Deficiency, Fractures) - TSH  4. Snoring  - Ambulatory referral  to Sleep Studies

## 2016-04-29 LAB — TSH: TSH: 1.81 m[IU]/L

## 2016-04-29 LAB — VITAMIN D 25 HYDROXY (VIT D DEFICIENCY, FRACTURES): VIT D 25 HYDROXY: 36 ng/mL (ref 30–100)

## 2016-04-30 ENCOUNTER — Encounter: Payer: Self-pay | Admitting: Family Medicine

## 2016-05-03 ENCOUNTER — Other Ambulatory Visit: Payer: Self-pay | Admitting: Family Medicine

## 2016-05-05 ENCOUNTER — Encounter: Payer: Self-pay | Admitting: Radiation Oncology

## 2016-05-05 ENCOUNTER — Ambulatory Visit
Admission: RE | Admit: 2016-05-05 | Discharge: 2016-05-05 | Disposition: A | Discharge status: Home / Self Care | Payer: 59 | Source: Ambulatory Visit | Attending: Radiation Oncology | Admitting: Radiation Oncology

## 2016-05-05 VITALS — BP 134/83 | HR 69 | Temp 97.8°F | Wt 209.1 lb

## 2016-05-05 DIAGNOSIS — C50412 Malignant neoplasm of upper-outer quadrant of left female breast: Secondary | ICD-10-CM

## 2016-05-05 DIAGNOSIS — Z923 Personal history of irradiation: Secondary | ICD-10-CM | POA: Insufficient documentation

## 2016-05-05 DIAGNOSIS — Z170 Estrogen receptor positive status [ER+]: Secondary | ICD-10-CM

## 2016-05-05 DIAGNOSIS — Z86000 Personal history of in-situ neoplasm of breast: Secondary | ICD-10-CM | POA: Insufficient documentation

## 2016-05-05 NOTE — Progress Notes (Signed)
.  Radiation Oncology Follow up Note  Name: Sonya Lopez   Date:   05/05/2016 MRN:  [id number] DOB: 04/18/1955    This 61 y.o. female presents to the clinic today for 6 month follow-up status post accelerated partial breast radiation to her left breast for ductal carcinoma in situ.  REFERRING PROVIDER: Steele Sizer, MD  HPI: Patient is a 61 year old female now out 6 months having completed accelerated partial breast rest irradiation to her left breast for ductal carcinoma in situ. She seen today in routine follow-up and is doing well. She is was started on tamoxifen although had significant side effects and is no longer on antiestrogen therapy. She had an episode about 6 weeks ago with some acute tenderness in the left breast which was self-limiting and short-lived. I believe were dealing with possible fat necrosis. She otherwise is without complaint..  COMPLICATIONS OF TREATMENT: none  FOLLOW UP COMPLIANCE: keeps appointments   PHYSICAL EXAM:  BP 134/83   Pulse 69   Temp 97.8 F (36.6 C)   Wt 209 lb 1.7 oz (94.8 kg)   BMI 33.75 kg/m  Lungs are clear to A&P cardiac examination essentially unremarkable with regular rate and rhythm. No dominant mass or nodularity is noted in either breast in 2 positions examined. Incision is well-healed. No axillary or supraclavicular adenopathy is appreciated. Cosmetic result is excellent. I see no evidence of any abnormality in the area of her prior pain. Well-developed well-nourished patient in NAD. HEENT reveals PERLA, EOMI, discs not visualized.  Oral cavity is clear. No oral mucosal lesions are identified. Neck is clear without evidence of cervical or supraclavicular adenopathy. Lungs are clear to A&P. Cardiac examination is essentially unremarkable with regular rate and rhythm without murmur rub or thrill. Abdomen is benign with no organomegaly or masses noted. Motor sensory and DTR levels are equal and symmetric in the upper and lower  extremities. Cranial nerves II through XII are grossly intact. Proprioception is intact. No peripheral adenopathy or edema is identified. No motor or sensory levels are noted. Crude visual fields are within normal range.  RADIOLOGY RESULTS: Patient to see surgeon the end of the month at which time diagnostic mammograms will be ordered  PLAN: Present time patient is doing well with no evidence of disease. I believe she had acute episode of possible fat necrosis. Otherwise she continues to do well. I've asked to see her back in 6 months for follow-up. I will then go to once your follow-up appointments. Patient knows to call sooner with any concerns. She again will discuss antiestrogen therapy with her surgeon.  I would like to take this opportunity to thank you for allowing me to participate in the care of your patient.Armstead Peaks., MD

## 2016-05-17 ENCOUNTER — Encounter: Payer: Self-pay | Admitting: Family Medicine

## 2016-05-17 DIAGNOSIS — G4730 Sleep apnea, unspecified: Secondary | ICD-10-CM | POA: Diagnosis not present

## 2016-05-24 ENCOUNTER — Inpatient Hospital Stay: Payer: Self-pay

## 2016-05-24 ENCOUNTER — Ambulatory Visit (INDEPENDENT_AMBULATORY_CARE_PROVIDER_SITE_OTHER): Payer: 59 | Admitting: General Surgery

## 2016-05-24 ENCOUNTER — Encounter: Payer: Self-pay | Admitting: *Deleted

## 2016-05-24 ENCOUNTER — Encounter: Payer: Self-pay | Admitting: General Surgery

## 2016-05-24 VITALS — BP 118/68 | HR 74 | Resp 14 | Ht 67.0 in | Wt 205.0 lb

## 2016-05-24 DIAGNOSIS — C50912 Malignant neoplasm of unspecified site of left female breast: Secondary | ICD-10-CM

## 2016-05-24 DIAGNOSIS — N6310 Unspecified lump in the right breast, unspecified quadrant: Secondary | ICD-10-CM

## 2016-05-24 DIAGNOSIS — N6001 Solitary cyst of right breast: Secondary | ICD-10-CM

## 2016-05-24 NOTE — Progress Notes (Signed)
Patient ID: Sonya Lopez, female   DOB: January 30, 1956, 61 y.o.   MRN: [id number]  Chief Complaint  Patient presents with  . Breast Cancer    HPI [name] is a 61 y.o. female  Here today for her follow up breast cancer check. Patient states she is having a lot of hot flashes.  Patient states she noticed a lump in her right breast about three weeks ago.HPI  Past Medical History:  Diagnosis Date  . Allergy   . Anxiety    situational anxiety  . Arrhythmia    A-fib/A-flutter  . Atrial flutter ([place])   . Breast cancer ([place]) 09/2015   left breast cancer  . Breast neoplasm, [place] (DCIS), left 10/2015   ER 50%, PR 11-50%.  . Cancer ([place])   . Depression   . Difficult intubation 2009   [place] had to use  'boogee"  . Female cystocele 01/05/2011  . Fibromyalgia   . GERD (gastroesophageal reflux disease)   . Headache(784.0)   . Kidney stones   . Obesity   . PONV (postoperative nausea and vomiting)    h/o difficult intubation and anes had to use "boogee"    Past Surgical History:  Procedure Laterality Date  . ANTERIOR AND POSTERIOR REPAIR  11/30/2011   Procedure: ANTERIOR (CYSTOCELE) AND POSTERIOR REPAIR (RECTOCELE);  Surgeon: Reece Packer, MD;  Location: [facility];  Service: Urology;  Laterality: N/A;  with cysto  . BLADDER SUSPENSION  11/30/2011   Procedure: [facility] PROCEDURE;  Surgeon: Reece Packer, MD;  Location: [facility];  Service: Urology;  Laterality: N/A;  graft 10x6  . BREAST BIOPSY Right 2007   negative core  . BREAST LUMPECTOMY WITH NEEDLE LOCALIZATION Left 09/30/2015   Procedure: BREAST LUMPECTOMY WITH NEEDLE LOCALIZATION;  Surgeon: Robert Bellow, MD;  Location: [facility];  Service: General;  Laterality: Left;  . CESAREAN SECTION     x 1  . COLONOSCOPY  2009  . CYSTOSCOPY  11/30/2011   Procedure: CYSTOSCOPY;  Surgeon: Reece Packer, MD;  Location: [facility];  Service: Urology;;  . Brigitte Pulse AND CURETTAGE OF UTERUS  05/05/2007  .  GALLBLADDER SURGERY  2004  . TONSILLECTOMY  1961  . VAGINAL HYSTERECTOMY  11/30/2011   Procedure: HYSTERECTOMY VAGINAL;  Surgeon: Emily Filbert, MD;  Location: [facility];  Service: Gynecology;  Laterality: N/A;    Family History  Problem Relation Age of Onset  . Diabetes Mother   . Hearing loss Mother   . Hypertension Mother   . Heart disease Father   . Heart disease Paternal Grandfather   . Asthma Daughter   . Asthma Daughter   . Migraines Daughter   . Fibromyalgia Daughter   . Interstitial cystitis Daughter   . Asthma Daughter   . Colitis Daughter   . Breast cancer Neg Hx     Social History Social History  Substance Use Topics  . Smoking status: Never Smoker  . Smokeless tobacco: Never Used  . Alcohol use 0.0 oz/week     Comment: rare    Allergies  Allergen Reactions  . Adhesive [Tape] Dermatitis    SKIN TURNS RED-PAPER TAPE OK TO USE  . Levaquin  [Levofloxacin]   . Macrodantin [Nitrofurantoin] Nausea And Vomiting  . Metronidazole Nausea Only  . Nitrofurantoin Macrocrystal Nausea And Vomiting  . Sulfamethoxazole-Trimethoprim Nausea Only  . Cephalexin Rash  . Clindamycin Hcl Rash  . Doxycycline Swelling and Rash  . Erythromycin Rash  Current Outpatient Prescriptions  Medication Sig Dispense Refill  . amiodarone (PACERONE) 200 MG tablet TAKE 1 TABLET TWICE A DAY AS NEEDED 60 tablet 3  . Azelaic Acid (FINACEA) 15 % cream Apply 1 application topically 2 (two) times daily as needed. For rosacea    . Calcium Carbonate-Vitamin D (CALTRATE 600+D) 600-400 MG-UNIT per tablet Take 1 tablet by mouth daily.     . Clindamycin Phosphate foam Apply 1 Units topically as needed. For rosacea    5  . DULoxetine (CYMBALTA) 60 MG capsule Take 1 capsule (60 mg total) by mouth daily. In place of Effexor 30 capsule 3  . FINACEA 15 % FOAM     . gabapentin (NEURONTIN) 100 MG capsule TAKE 1 CAPSULE BY MOUTH TWO TIMES DAILY 60 capsule 0  . magnesium oxide (MAG-OX) 400 MG tablet Take 400 mg  by mouth every morning.     . metaxalone (SKELAXIN) 800 MG tablet Take 1 tablet (800 mg total) by mouth at bedtime as needed for muscle spasms. 90 tablet 2  . mometasone (NASONEX) 50 MCG/ACT nasal spray Place 2 sprays into the nose every morning.     . naratriptan (AMERGE) 2.5 MG tablet TAKE 1 TABLET BY MOUTH AS NEEDED 9 tablet 2  . Probiotic Product (PROBIOTIC PO) Take 1 capsule by mouth daily.     . ranitidine (ZANTAC) 150 MG capsule Take 150 mg by mouth as needed for heartburn.    . tamsulosin (FLOMAX) 0.4 MG CAPS capsule Take 0.8 mg by mouth daily after breakfast.      No current facility-administered medications for this visit.     Review of Systems Review of Systems  Constitutional: Negative.   Respiratory: Negative.   Cardiovascular: Negative.     Blood pressure 118/68, pulse 74, resp. rate 14, height 5\' 7"  (1.702 m), weight 205 lb (93 kg).  Physical Exam Physical Exam  Constitutional: She is oriented to person, place, and time. She appears well-developed and well-nourished.  Eyes: Conjunctivae are normal. No scleral icterus.  Neck: Neck supple.  Cardiovascular: Normal rate, regular rhythm and normal heart sounds.   Pulmonary/Chest: Effort normal and breath sounds normal. Right breast exhibits no inverted nipple, no mass, no nipple discharge, no skin change and no tenderness. Left breast exhibits no inverted nipple, no mass, no nipple discharge, no skin change and no tenderness.    Right breast thickening at 10 o'clock position,  7 cm from nipple.  Lymphadenopathy:    She has no cervical adenopathy.    She has no axillary adenopathy.  Neurological: She is alert and oriented to person, place, and time.  Skin: Skin is warm and dry.    Data Reviewed Ultrasound examination of the right breast was undertaken in the area of focal thickening in the 10:00 position. Numerous simple cyst were identified. These exhibited smooth walls, posterior acoustic enhancement and no vascular  flow. The largest measured 3.12 cm. In the 11:00 position, 5 cm from the nipple a 0.76 x 0.92 x 1.1 cm simple cyst was noted. At the 12:00 position multiple simple cyst were noted measuring less than 6 mm in diameter. Imaging along the lateral aspect of the chest wall between the anterior and midaxillary line showed a possibly of breast parenchyma but focal tenderness with scanning over the serratus muscle. BI-RADS-2.  Assessment    Right lateral chest wall tenderness unrelated to the breast.  Stable left breast exam.    Plan    Observation alone is required for the  multiple right breast cyst.       The patient has been asked to return to the office in 4 months with a bilateral diagnostic mammogram.  HPI, Physical Exam, Assessment and Plan have been scribed under the direction and in the presence of Hervey Ard, MD.  Gaspar Cola, CMA  I have completed the exam and reviewed the above documentation for accuracy and completeness.  I agree with the above.  Haematologist has been used and any errors in dictation or transcription are unintentional.  Hervey Ard, M.D., F.A.C.S.    Robert Bellow 05/25/2016, 8:15 PM

## 2016-05-24 NOTE — Patient Instructions (Addendum)
The patient has been asked to return to the office in 4 months with a bilateral diagnostic mammogram

## 2016-05-25 DIAGNOSIS — N6001 Solitary cyst of right breast: Secondary | ICD-10-CM | POA: Insufficient documentation

## 2016-05-26 ENCOUNTER — Ambulatory Visit (INDEPENDENT_AMBULATORY_CARE_PROVIDER_SITE_OTHER): Payer: 59 | Admitting: Family Medicine

## 2016-05-26 ENCOUNTER — Encounter: Payer: Self-pay | Admitting: Family Medicine

## 2016-05-26 VITALS — BP 112/68 | HR 96 | Temp 97.8°F | Resp 16 | Ht 66.0 in | Wt 209.0 lb

## 2016-05-26 DIAGNOSIS — M62838 Other muscle spasm: Secondary | ICD-10-CM

## 2016-05-26 DIAGNOSIS — G43009 Migraine without aura, not intractable, without status migrainosus: Secondary | ICD-10-CM | POA: Diagnosis not present

## 2016-05-26 MED ORDER — TRIAMCINOLONE ACETONIDE 40 MG/ML IJ SUSP
40.0000 mg | Freq: Once | INTRAMUSCULAR | Status: AC
  Administered 2016-05-26: 40 mg via INTRAMUSCULAR

## 2016-05-26 MED ORDER — LIDOCAINE HCL (PF) 1 % IJ SOLN
2.0000 mL | Freq: Once | INTRAMUSCULAR | Status: AC
  Administered 2016-05-26: 2 mL via INTRADERMAL

## 2016-05-26 NOTE — Progress Notes (Signed)
Name: Sonya Lopez   MRN: [id number]    DOB: [birth year]   Date:05/26/2016       Progress Note  Subjective  Chief Complaint  Chief Complaint  Patient presents with  . Neck Pain  . Migraine    HPI  Migraine headache: episodes about every 2 weeks, but had a severe episode this past weekend, she had to take two doses of Amerge, episode lasted 11 hours. She states she felt nauseated, but no vomiting. Headache was left frontal and radiated to left nuchal area, which triggered worsening of trapezium muscle pain. She states since than the pain that had gone down to 2/10 is back up at 5/10 and she would like another trigger point injections. She still seeing chiropractor and is having massage and manipulation therapy.   Patient Active Problem List   Diagnosis Date Noted  . Benign breast cyst in female, right 05/25/2016  . History of left breast cancer 09/16/2015  . Long-term use of high-risk medication 02/25/2015  . Increased urinary frequency 08/01/2014  . Mild major depression ([place]) 07/29/2014  . Gastric reflux 07/29/2014  . Chronic interstitial cystitis 07/29/2014  . Calculus of kidney 07/29/2014  . Allergic rhinitis 07/29/2014  . Acne erythematosa 07/29/2014  . Lumbosacral radiculitis 07/29/2014  . Tinnitus 07/29/2014  . Mixed incontinence, urge and stress (female) (female) 08/23/2013  . Hyperlipidemia 04/25/2013  . H/O: hysterectomy 11/30/2011  . Atrial flutter ([place]) 05/03/2011  . Diet-controlled type 2 diabetes mellitus ([place]) 04/16/2011  . Bladder cystocele 01/05/2011  . Migraine without aura and without status migrainosus, not intractable 12/29/2010  . Fibromyalgia 12/29/2010  . Obesity 12/29/2010    Past Surgical History:  Procedure Laterality Date  . ANTERIOR AND POSTERIOR REPAIR  11/30/2011   Procedure: ANTERIOR (CYSTOCELE) AND POSTERIOR REPAIR (RECTOCELE);  Surgeon: Reece Packer, MD;  Location: [facility];  Service: Urology;  Laterality: N/A;  with cysto  .  BLADDER SUSPENSION  11/30/2011   Procedure: [facility] PROCEDURE;  Surgeon: Reece Packer, MD;  Location: [place];  Service: Urology;  Laterality: N/A;  graft 10x6  . BREAST BIOPSY Right 2007   negative core  . BREAST LUMPECTOMY WITH NEEDLE LOCALIZATION Left 09/30/2015   Procedure: BREAST LUMPECTOMY WITH NEEDLE LOCALIZATION;  Surgeon: Robert Bellow, MD;  Location: [facility];  Service: General;  Laterality: Left;  . CESAREAN SECTION     x 1  . COLONOSCOPY  2009  . CYSTOSCOPY  11/30/2011   Procedure: CYSTOSCOPY;  Surgeon: Reece Packer, MD;  Location: [facility];  Service: Urology;;  . Brigitte Pulse AND CURETTAGE OF UTERUS  05/05/2007  . GALLBLADDER SURGERY  2004  . TONSILLECTOMY  1961  . VAGINAL HYSTERECTOMY  11/30/2011   Procedure: HYSTERECTOMY VAGINAL;  Surgeon: Emily Filbert, MD;  Location: [facility];  Service: Gynecology;  Laterality: N/A;    Family History  Problem Relation Age of Onset  . Diabetes Mother   . Hearing loss Mother   . Hypertension Mother   . Heart disease Father   . Heart disease Paternal Grandfather   . Asthma Daughter   . Asthma Daughter   . Migraines Daughter   . Fibromyalgia Daughter   . Interstitial cystitis Daughter   . Asthma Daughter   . Colitis Daughter   . Breast cancer Neg Hx     Social History   Social History  . Marital status: Married    Spouse name: N/A  . Number of children: N/A  . Years of education: N/A  Occupational History  . Not on file.   Social History Main Topics  . Smoking status: Never Smoker  . Smokeless tobacco: Never Used  . Alcohol use 0.0 oz/week     Comment: rare  . Drug use: No  . Sexual activity: Yes    Partners: Male   Other Topics Concern  . Not on file   Social History Narrative  . No narrative on file     Current Outpatient Prescriptions:  .  amiodarone (PACERONE) 200 MG tablet, TAKE 1 TABLET TWICE A DAY AS NEEDED, Disp: 60 tablet, Rfl: 3 .  Azelaic Acid (FINACEA) 15 % cream, Apply 1 application topically  2 (two) times daily as needed. For rosacea, Disp: , Rfl:  .  Calcium Carbonate-Vitamin D (CALTRATE 600+D) 600-400 MG-UNIT per tablet, Take 1 tablet by mouth daily. , Disp: , Rfl:  .  Clindamycin Phosphate foam, Apply 1 Units topically as needed. For rosacea  , Disp: , Rfl: 5 .  DULoxetine (CYMBALTA) 60 MG capsule, Take 1 capsule (60 mg total) by mouth daily. In place of Effexor, Disp: 30 capsule, Rfl: 3 .  FINACEA 15 % FOAM, , Disp: , Rfl:  .  gabapentin (NEURONTIN) 100 MG capsule, TAKE 1 CAPSULE BY MOUTH TWO TIMES DAILY, Disp: 60 capsule, Rfl: 0 .  magnesium oxide (MAG-OX) 400 MG tablet, Take 400 mg by mouth every morning. , Disp: , Rfl:  .  metaxalone (SKELAXIN) 800 MG tablet, Take 1 tablet (800 mg total) by mouth at bedtime as needed for muscle spasms., Disp: 90 tablet, Rfl: 2 .  mometasone (NASONEX) 50 MCG/ACT nasal spray, Place 2 sprays into the nose every morning. , Disp: , Rfl:  .  naratriptan (AMERGE) 2.5 MG tablet, TAKE 1 TABLET BY MOUTH AS NEEDED, Disp: 9 tablet, Rfl: 2 .  Probiotic Product (PROBIOTIC PO), Take 1 capsule by mouth daily. , Disp: , Rfl:  .  ranitidine (ZANTAC) 150 MG capsule, Take 150 mg by mouth as needed for heartburn., Disp: , Rfl:  .  tamsulosin (FLOMAX) 0.4 MG CAPS capsule, Take 0.8 mg by mouth daily after breakfast. , Disp: , Rfl:   Allergies  Allergen Reactions  . Adhesive [Tape] Dermatitis    SKIN TURNS RED-PAPER TAPE OK TO USE  . Levaquin  [Levofloxacin]   . Macrodantin [Nitrofurantoin] Nausea And Vomiting  . Metronidazole Nausea Only  . Nitrofurantoin Macrocrystal Nausea And Vomiting  . Sulfamethoxazole-Trimethoprim Nausea Only  . Cephalexin Rash  . Clindamycin Hcl Rash  . Doxycycline Swelling and Rash  . Erythromycin Rash     ROS  Ten systems reviewed and is negative except as mentioned in HPI   Objective  Vitals:   05/26/16 0811  BP: 112/68  Pulse: 96  Resp: 16  Temp: 97.8 F (36.6 C)  TempSrc: Oral  SpO2: 96%  Weight: 209 lb (94.8 kg)   Height: 5\' 6"  (1.676 m)    Body mass index is 33.73 kg/m.  Physical Exam  Constitutional: Patient appears well-developed and well-nourished. Obese No distress.  HEENT: head atraumatic, normocephalic, pupils equal and reactive to light, neck supple, but has pain on left trapezium area wit right lateral bending, no tingling or numbness, throat within normal limits Cardiovascular: Normal rate, regular rhythm and normal heart sounds.  No murmur heard. No BLE edema. Pulmonary/Chest: Effort normal and breath sounds normal. No respiratory distress. Abdominal: Soft.  There is no tenderness. Psychiatric: Patient has a normal mood and affect. behavior is normal. Judgment and thought content normal.  Muscular Skeletal: pain on left trapezium area, normal rom of both shoulders  Recent Results (from the past 2160 hour(s))  Vitamin B12     Status: None   Collection Time: 04/28/16  3:25 PM  Result Value Ref Range   Vitamin B-12 372 200 - 1,100 pg/mL  VITAMIN D 25 Hydroxy (Vit-D Deficiency, Fractures)     Status: None   Collection Time: 04/28/16  3:25 PM  Result Value Ref Range   Vit D, 25-Hydroxy 36 30 - 100 ng/mL    Comment: Vitamin D Status           25-OH Vitamin D        Deficiency                <20 ng/mL        Insufficiency         20 - 29 ng/mL        Optimal             > or = 30 ng/mL   For 25-OH Vitamin D testing on patients on D2-supplementation and patients for whom quantitation of D2 and D3 fractions is required, the QuestAssureD 25-OH VIT D, (D2,D3), LC/MS/MS is recommended: order code [id number] (patients > 2 yrs).   TSH     Status: None   Collection Time: 04/28/16  3:25 PM  Result Value Ref Range   TSH 1.81 mIU/L    Comment:   Reference Range   > or = 20 Years  0.40-4.50   Pregnancy Range First trimester  0.26-2.66 Second trimester 0.55-2.73 Third trimester  0.43-2.91         PHQ2/9: Depression screen [facility] 2/9 04/28/2016 12/03/2015 11/25/2015 10/14/2015 09/24/2015   Decreased Interest 0 0 0 0 0  Down, Depressed, Hopeless 0 0 0 0 0  PHQ - 2 Score 0 0 0 0 0     Fall Risk: Fall Risk  04/28/2016 11/25/2015 10/14/2015 09/24/2015 06/02/2015  Falls in the past year? Yes Yes No No No  Number falls in past yr: 1 1 - - -  Injury with Fall? No No - - -    Assessment & Plan  1. Muscle spasm  Consent form signed Localized muscle group Injection with lidocaine 1% and Kenalog 40mg /1 ml on muscle  Patient tolerated procedure well No side effects   2. Migraine without aura and without status migrainosus, not intractable  Continue current medications, and advised to take Skelaxin if migraine goes nuchal area.

## 2016-06-01 ENCOUNTER — Other Ambulatory Visit: Payer: Self-pay | Admitting: Family Medicine

## 2016-06-01 DIAGNOSIS — F320 Major depressive disorder, single episode, mild: Secondary | ICD-10-CM

## 2016-06-01 NOTE — Telephone Encounter (Signed)
Patient requesting refill of Duloxetine to [facility].

## 2016-06-17 ENCOUNTER — Other Ambulatory Visit: Payer: Self-pay

## 2016-06-17 DIAGNOSIS — G43009 Migraine without aura, not intractable, without status migrainosus: Secondary | ICD-10-CM

## 2016-06-17 NOTE — Telephone Encounter (Signed)
Patient requesting refill of naratriptan to [place].

## 2016-06-20 MED ORDER — NARATRIPTAN HCL 2.5 MG PO TABS: 2.5000 mg | ORAL_TABLET | ORAL | 2 refills | Status: DC | PRN

## 2016-06-29 ENCOUNTER — Other Ambulatory Visit: Payer: Self-pay | Admitting: Family Medicine

## 2016-06-29 DIAGNOSIS — M797 Fibromyalgia: Secondary | ICD-10-CM

## 2016-06-29 NOTE — Telephone Encounter (Signed)
30 day supply provided. Pt has follow up with Dr. Shirley Friar 07/13/16, refills may be provided during this visit.

## 2016-07-13 ENCOUNTER — Encounter: Payer: Self-pay | Admitting: Family Medicine

## 2016-07-13 ENCOUNTER — Encounter: Payer: Self-pay | Admitting: Cardiovascular Disease

## 2016-07-13 ENCOUNTER — Ambulatory Visit (INDEPENDENT_AMBULATORY_CARE_PROVIDER_SITE_OTHER): Payer: 59 | Admitting: Family Medicine

## 2016-07-13 VITALS — BP 110/60 | HR 90 | Temp 97.9°F | Resp 16 | Ht 66.0 in | Wt 214.2 lb

## 2016-07-13 DIAGNOSIS — E782 Mixed hyperlipidemia: Secondary | ICD-10-CM

## 2016-07-13 DIAGNOSIS — M7062 Trochanteric bursitis, left hip: Secondary | ICD-10-CM

## 2016-07-13 DIAGNOSIS — E119 Type 2 diabetes mellitus without complications: Secondary | ICD-10-CM

## 2016-07-13 DIAGNOSIS — Z1159 Encounter for screening for other viral diseases: Secondary | ICD-10-CM

## 2016-07-13 DIAGNOSIS — R251 Tremor, unspecified: Secondary | ICD-10-CM | POA: Diagnosis not present

## 2016-07-13 DIAGNOSIS — G43009 Migraine without aura, not intractable, without status migrainosus: Secondary | ICD-10-CM | POA: Diagnosis not present

## 2016-07-13 DIAGNOSIS — F320 Major depressive disorder, single episode, mild: Secondary | ICD-10-CM | POA: Diagnosis not present

## 2016-07-13 DIAGNOSIS — I4892 Unspecified atrial flutter: Secondary | ICD-10-CM

## 2016-07-13 DIAGNOSIS — M797 Fibromyalgia: Secondary | ICD-10-CM

## 2016-07-13 MED ORDER — GABAPENTIN 100 MG PO CAPS: 100.0000 mg | ORAL_CAPSULE | Freq: Two times a day (BID) | ORAL | 1 refills | Status: DC

## 2016-07-13 NOTE — Progress Notes (Signed)
Name: Sonya Lopez   MRN: [id number]    DOB: 1955-09-05   Date:07/13/2016       Progress Note  Subjective  Chief Complaint  Chief Complaint  Patient presents with  . Depression    6 month follow up  . Fibromyalgia  . Atrial Fibrillation  . Hyperlipidemia    HPI  FMS: She states that Cymbalta and Gabapentin seems to be helping with symptoms. Pain is still daily, pain overall 4/10, hip is down to 2/10 since she started using a pillow between legs while sleeping.   Atrial Flutter: Episodes are stable with Amiodarone,  She is supposed to take Metoprolol and Inderal prn only but not recently.  HR goes up to 150's - seeing Dr. Rockey Situ. TSH normal, however she is worried about side effects of Amiodarone, such as hand tremors, fatigue, she will contact Dr. Rockey Situ and see if the medication can be changed  Migraine Headaches: episodes down to a few times a month usually on left maxillary area and radiates to left forehead and down to left side of neck, Axert  kicks in within 40 minutes, sometimes associated with nausea, but no vomiting. Last episode this past weekend.  Usually triggered by stress  Mild Major Depression: feeling overwhelmed, over the past 18 months she has been under a lot of stress. Initially problems at her church - new pastor ( they now moved to another church in [place] ) she is concerned about Jillyn Ledger going to public school in the Fall , daughter separated July 2017, they had to change church about one year ago. She is on Cymbalta and states it is helping her deal with all the stress in her life.  DMII: she is on diet only and hgbA1C is under control, urine micro was negative last visit, we will recheck it today. She denies polyphagia, no polyuria or polydipsia. Eye exam is up to date  GERD: under control with medication, she tries alternating Prilosec and Zantac.  History of left breast cancer: she had lumpectomy, could not tolerate estrogen suppressive  therapy.    Patient Active Problem List   Diagnosis Date Noted  . Benign breast cyst in female, right 05/25/2016  . History of left breast cancer 09/16/2015  . Long-term use of high-risk medication 02/25/2015  . Increased urinary frequency 08/01/2014  . Mild major depression ([place]) 07/29/2014  . Gastric reflux 07/29/2014  . Chronic interstitial cystitis 07/29/2014  . Calculus of kidney 07/29/2014  . Allergic rhinitis 07/29/2014  . Acne erythematosa 07/29/2014  . Lumbosacral radiculitis 07/29/2014  . Tinnitus 07/29/2014  . Mixed incontinence, urge and stress (female) (female) 08/23/2013  . Hyperlipidemia 04/25/2013  . H/O: hysterectomy 11/30/2011  . Atrial flutter ([place]) 05/03/2011  . Diet-controlled type 2 diabetes mellitus ([place]) 04/16/2011  . Bladder cystocele 01/05/2011  . Migraine without aura and without status migrainosus, not intractable 12/29/2010  . Fibromyalgia 12/29/2010  . Obesity 12/29/2010    Past Surgical History:  Procedure Laterality Date  . ANTERIOR AND POSTERIOR REPAIR  11/30/2011   Procedure: ANTERIOR (CYSTOCELE) AND POSTERIOR REPAIR (RECTOCELE);  Surgeon: Reece Packer, MD;  Location: [facility];  Service: Urology;  Laterality: N/A;  with cysto  . BLADDER SUSPENSION  11/30/2011   Procedure: [facility] PROCEDURE;  Surgeon: Reece Packer, MD;  Location: [place] [facility];  Service: Urology;  Laterality: N/A;  graft 10x6  . BREAST BIOPSY Right 2007   negative core  . BREAST LUMPECTOMY WITH NEEDLE LOCALIZATION Left 09/30/2015   Procedure: BREAST LUMPECTOMY  WITH NEEDLE LOCALIZATION;  Surgeon: Robert Bellow, MD;  Location: [facility];  Service: General;  Laterality: Left;  . CESAREAN SECTION     x 1  . COLONOSCOPY  2009  . CYSTOSCOPY  11/30/2011   Procedure: CYSTOSCOPY;  Surgeon: Reece Packer, MD;  Location: [facility];  Service: Urology;;  . Brigitte Pulse AND CURETTAGE OF UTERUS  05/05/2007  . GALLBLADDER SURGERY  2004  . TONSILLECTOMY  1961  . VAGINAL HYSTERECTOMY   11/30/2011   Procedure: HYSTERECTOMY VAGINAL;  Surgeon: Emily Filbert, MD;  Location: [facility];  Service: Gynecology;  Laterality: N/A;    Family History  Problem Relation Age of Onset  . Diabetes Mother   . Hearing loss Mother   . Hypertension Mother   . Heart disease Father   . Heart disease Paternal Grandfather   . Asthma Daughter   . Asthma Daughter   . Migraines Daughter   . Fibromyalgia Daughter   . Interstitial cystitis Daughter   . Asthma Daughter   . Colitis Daughter   . Breast cancer Neg Hx     Social History   Social History  . Marital status: Married    Spouse name: N/A  . Number of children: N/A  . Years of education: N/A   Occupational History  . Not on file.   Social History Main Topics  . Smoking status: Never Smoker  . Smokeless tobacco: Never Used  . Alcohol use 0.0 oz/week     Comment: rare  . Drug use: No  . Sexual activity: Yes    Partners: Male   Other Topics Concern  . Not on file   Social History Narrative  . No narrative on file     Current Outpatient Prescriptions:  .  cyanocobalamin 500 MCG tablet, Take 500 mcg by mouth daily., Disp: , Rfl:  .  Multiple Vitamin (MULTIVITAMIN) tablet, Take 1 tablet by mouth daily., Disp: , Rfl:  .  Triprolidine-Pseudoephedrine (ANTIHISTAMINE PO), Take 10 mg by mouth., Disp: , Rfl:  .  amiodarone (PACERONE) 200 MG tablet, TAKE 1 TABLET TWICE A DAY AS NEEDED, Disp: 60 tablet, Rfl: 3 .  Azelaic Acid (FINACEA) 15 % cream, Apply 1 application topically 2 (two) times daily as needed. For rosacea, Disp: , Rfl:  .  Calcium Carbonate-Vitamin D (CALTRATE 600+D) 600-400 MG-UNIT per tablet, Take 1 tablet by mouth daily. , Disp: , Rfl:  .  Clindamycin Phosphate foam, Apply 1 Units topically as needed. For rosacea  , Disp: , Rfl: 5 .  DULoxetine (CYMBALTA) 60 MG capsule, TAKE ONE CAPSULE DAILY, Disp: 30 capsule, Rfl: 5 .  FINACEA 15 % FOAM, , Disp: , Rfl:  .  gabapentin (NEURONTIN) 100 MG capsule, Take 1 capsule (100  mg total) by mouth 2 (two) times daily., Disp: 180 capsule, Rfl: 1 .  magnesium oxide (MAG-OX) 400 MG tablet, Take 400 mg by mouth every morning. , Disp: , Rfl:  .  metaxalone (SKELAXIN) 800 MG tablet, Take 1 tablet (800 mg total) by mouth at bedtime as needed for muscle spasms., Disp: 90 tablet, Rfl: 2 .  mometasone (NASONEX) 50 MCG/ACT nasal spray, Place 2 sprays into the nose every morning. , Disp: , Rfl:  .  naratriptan (AMERGE) 2.5 MG tablet, Take 1 tablet (2.5 mg total) by mouth as needed. Take one (1) tablet at onset of headache; if returns or does not resolve, may repeat after 4 hours; do not exceed five (5) mg in 24 hours., Disp: 9  tablet, Rfl: 2 .  Probiotic Product (PROBIOTIC PO), Take 1 capsule by mouth daily. , Disp: , Rfl:  .  ranitidine (ZANTAC) 150 MG capsule, Take 150 mg by mouth as needed for heartburn., Disp: , Rfl:  .  tamsulosin (FLOMAX) 0.4 MG CAPS capsule, Take 0.8 mg by mouth daily after breakfast. , Disp: , Rfl:   Allergies  Allergen Reactions  . Adhesive [Tape] Dermatitis    SKIN TURNS RED-PAPER TAPE OK TO USE  . Levaquin  [Levofloxacin]   . Macrodantin [Nitrofurantoin] Nausea And Vomiting  . Metronidazole Nausea Only  . Nitrofurantoin Macrocrystal Nausea And Vomiting  . Sulfamethoxazole-Trimethoprim Nausea Only  . Cephalexin Rash  . Clindamycin Hcl Rash  . Doxycycline Swelling and Rash  . Erythromycin Rash     ROS  Constitutional: Negative for fever or weight change.  Respiratory: Negative for cough and shortness of breath.   Cardiovascular: Negative for chest pain or palpitations.  Gastrointestinal: Negative for abdominal pain, no bowel changes.  Musculoskeletal: Negative for gait problem or joint swelling.  Skin: Negative for rash.  Neurological: Negative for dizziness or headache.  No other specific complaints in a complete review of systems (except as listed in HPI above).  Objective  Vitals:   07/13/16 0910  BP: 110/60  Pulse: 90  Resp: 16   Temp: 97.9 F (36.6 C)  SpO2: 97%  Weight: 214 lb 3 oz (97.2 kg)  Height: 5\' 6"  (1.676 m)    Body mass index is 34.57 kg/m.  Physical Exam  Constitutional: Patient appears well-developed and well-nourished. Obese  No distress.  HEENT: head atraumatic, normocephalic, pupils equal and reactive to light, neck supple, throat within normal limits Cardiovascular: Normal rate, regular rhythm and normal heart sounds.  No murmur heard. No BLE edema. Pulmonary/Chest: Effort normal and breath sounds normal. No respiratory distress. Abdominal: Soft.  There is no tenderness. Psychiatric: Patient has a normal mood and affect. behavior is normal. Judgment and thought content normal. Muscular Skeletal: trigger point positive   Recent Results (from the past 2160 hour(s))  Vitamin B12     Status: None   Collection Time: 04/28/16  3:25 PM  Result Value Ref Range   Vitamin B-12 372 200 - 1,100 pg/mL  VITAMIN D 25 Hydroxy (Vit-D Deficiency, Fractures)     Status: None   Collection Time: 04/28/16  3:25 PM  Result Value Ref Range   Vit D, 25-Hydroxy 36 30 - 100 ng/mL    Comment: Vitamin D Status           25-OH Vitamin D        Deficiency                <20 ng/mL        Insufficiency         20 - 29 ng/mL        Optimal             > or = 30 ng/mL   For 25-OH Vitamin D testing on patients on D2-supplementation and patients for whom quantitation of D2 and D3 fractions is required, the QuestAssureD 25-OH VIT D, (D2,D3), LC/MS/MS is recommended: order code [id number] (patients > 2 yrs).   TSH     Status: None   Collection Time: 04/28/16  3:25 PM  Result Value Ref Range   TSH 1.81 mIU/L    Comment:   Reference Range   > or = 20 Years  0.40-4.50   Pregnancy Range First trimester  0.26-2.66 Second trimester 0.55-2.73 Third trimester  0.43-2.91         PHQ2/9: Depression screen [facility] 2/9 04/28/2016 12/03/2015 11/25/2015 10/14/2015 09/24/2015  Decreased Interest 0 0 0 0 0  Down, Depressed,  Hopeless 0 0 0 0 0  PHQ - 2 Score 0 0 0 0 0     Fall Risk: Fall Risk  04/28/2016 11/25/2015 10/14/2015 09/24/2015 06/02/2015  Falls in the past year? Yes Yes No No No  Number falls in past yr: 1 1 - - -  Injury with Fall? No No - - -     Assessment & Plan  1. Migraine without aura and without status migrainosus, not intractable  Continue medication   2. Trochanteric bursitis, left hip  Doing better  3. Mixed hyperlipidemia  Continue life style modification   4. Mild major depression (HCC)  Continue Cymbalta  5. Fibromyalgia  - gabapentin (NEURONTIN) 100 MG capsule; Take 1 capsule (100 mg total) by mouth 2 (two) times daily.  Dispense: 180 capsule; Refill: 1  6. Atrial flutter, unspecified type [facility])  She will contact Dr. Rockey Situ and see if they can stop Amiodarone  7. Encounter for hepatitis C screening test for low risk patient  - Hepatitis C antibody  8. Diet-controlled type 2 diabetes mellitus (HCC)  - Hemoglobin A1c

## 2016-07-14 LAB — HEMOGLOBIN A1C
Hgb A1c MFr Bld: 5.5 % (ref <5.7)
MEAN PLASMA GLUCOSE: 111 mg/dL

## 2016-07-14 LAB — HEPATITIS C ANTIBODY: HCV Ab: NEGATIVE

## 2016-07-15 DIAGNOSIS — R799 Abnormal finding of blood chemistry, unspecified: Secondary | ICD-10-CM | POA: Diagnosis not present

## 2016-07-15 DIAGNOSIS — E569 Vitamin deficiency, unspecified: Secondary | ICD-10-CM | POA: Diagnosis not present

## 2016-07-15 DIAGNOSIS — E039 Hypothyroidism, unspecified: Secondary | ICD-10-CM | POA: Diagnosis not present

## 2016-07-15 DIAGNOSIS — D510 Vitamin B12 deficiency anemia due to intrinsic factor deficiency: Secondary | ICD-10-CM | POA: Diagnosis not present

## 2016-07-15 DIAGNOSIS — R5382 Chronic fatigue, unspecified: Secondary | ICD-10-CM | POA: Diagnosis not present

## 2016-08-24 ENCOUNTER — Ambulatory Visit: Payer: 59 | Admitting: Cardiovascular Disease

## 2016-09-03 ENCOUNTER — Telehealth: Payer: 59 | Admitting: Family

## 2016-09-03 DIAGNOSIS — R399 Unspecified symptoms and signs involving the genitourinary system: Secondary | ICD-10-CM

## 2016-09-03 MED ORDER — AMOXICILLIN-POT CLAVULANATE 875-125 MG PO TABS: 1.0000 | ORAL_TABLET | Freq: Two times a day (BID) | ORAL | 0 refills | Status: DC

## 2016-09-03 NOTE — Progress Notes (Signed)
We are sorry that you are not feeling well.  Here is how we plan to help!  Based on what you shared with me it looks like you most likely have a simple urinary tract infection.  A UTI (Urinary Tract Infection) is a bacterial infection of the bladder.  Most cases of urinary tract infections are simple to treat but a key part of your care is to encourage you to drink plenty of fluids and watch your symptoms carefully.  I have prescribed Augmentin twice a day  for 7 days.  Your symptoms should gradually improve. Call us if the burning in your urine worsens, you develop worsening fever, back pain or pelvic pain or if your symptoms do not resolve after completing the antibiotic.  Urinary tract infections can be prevented by drinking plenty of water to keep your body hydrated.  Also be sure when you wipe, wipe from front to back and don't hold it in!  If possible, empty your bladder every 4 hours.  Your e-visit answers were reviewed by a board certified advanced clinical practitioner to complete your personal care plan.  Depending on the condition, your plan could have included both over the counter or prescription medications.  If there is a problem please reply  once you have received a response from your provider.  Your safety is important to [name].  If you have drug allergies check your prescription carefully.    You can use MyChart to ask questions about today's visit, request a non-urgent call back, or ask for a work or school excuse for 24 hours related to this e-Visit. If it has been greater than 24 hours you will need to follow up with your provider, or enter a new e-Visit to address those concerns.   You will get an e-mail in the next two days asking about your experience.  I hope that your e-visit has been valuable and will speed your recovery. Thank you for using e-visits.

## 2016-09-07 ENCOUNTER — Ambulatory Visit
Admission: RE | Admit: 2016-09-07 | Discharge: 2016-09-07 | Disposition: A | Discharge status: Home / Self Care | Payer: 59 | Source: Ambulatory Visit | Attending: General Surgery | Admitting: General Surgery

## 2016-09-07 DIAGNOSIS — N6001 Solitary cyst of right breast: Secondary | ICD-10-CM | POA: Diagnosis not present

## 2016-09-07 DIAGNOSIS — C50912 Malignant neoplasm of unspecified site of left female breast: Secondary | ICD-10-CM

## 2016-09-07 DIAGNOSIS — N6489 Other specified disorders of breast: Secondary | ICD-10-CM | POA: Diagnosis not present

## 2016-09-07 DIAGNOSIS — R922 Inconclusive mammogram: Secondary | ICD-10-CM | POA: Diagnosis not present

## 2016-09-07 HISTORY — DX: Personal history of irradiation: Z92.3

## 2016-09-12 NOTE — Progress Notes (Signed)
Cardiology Office Note  Date:  09/13/2016   ID:  Sonya Lopez, DOB May 19, 1955, MRN [id number]  PCP:  Steele Sizer, MD   Chief Complaint  Patient presents with  . other    12 month follow up. Meds reviewed by the pt. verbally. "doing well."     HPI:  [name] is a very pleasant 61 year old woman with a history of diabetes,  GERD,  hospital admission in 04/16/2011 for atrial flutter with RVR.  She presents for routine followup of her arrhythmia.   Off amio since 09/01/2016  No arrhythmia in 2 months without medication Had a tremor, constipation, felt it could've been from the amiodarone Had 10 episodes of arrhythmia /flutter  on the amiodarone Active, no new issues She does have propranolol and amiodarone to take at home for breakthrough flutter  No significant shortness of breath,  she has chronic mild nonpitting lower extremity edema worse in the summer  Recent lab work reviewed with her Total chole 177, LDL 105 Hemoglobin A1c in the 5 range  Father had significant history of coronary artery disease but was a heavy smoker  EKG on today's visit shows normal sinus rhythm with rate 59 bpm, no significant ST or T-wave changes  Other past medical history reviewed She details numerous episodes of atrial flutter at the end of 2016 Episode September 6, September 9, September 19, September 26, September 30, October 26, 12/08/2014, November 18, November 25, December 22 Typically episodes will last 1.5 up to 2 hours  Heart rate typically runs 110, sometimes 155 bpm, one episode 170 bpm Relatively symptomatic when she has episodes  She does report significant stress in the family, particularly over the summer 2016 She was started on Cymbalta with improvement of her symptoms. Feels less stressed  EKG on today's visit shows sinus rhythm with rate 62 bpm, no significant ST or T-wave changes  Previous episodes of atrial flutter in November 2015, January 2016 typically  lasting less than 2 hours In the past she did not want to take a medication every day  She takes care of her mother who has significant medical issues. Significant stress at home. She does report having some chest pain in February and early March 2015 which seemed to resolve by starting a proton pump inhibitor. No further chest discomfort with biking or other exercise.  Previous  Echocardiogram is essentially normal with normal systolic function, ejection fraction greater than 55%, no significant valvular disease for wall motion abnormality. Total cholesterol 196, LDL 121, HDL 59. Cholesterol is up from 160s since then she has been taking great   PMH:   has a past medical history of Allergy; Anxiety; Arrhythmia; Atrial flutter ([place]); Breast cancer ([place]) (09/2015); Breast neoplasm, Tis (DCIS), left (10/2015); Depression; Difficult intubation (2009); Female cystocele (01/05/2011); Fibromyalgia; GERD (gastroesophageal reflux disease); Headache(784.0); Kidney stones; Obesity; Personal history of radiation therapy; and PONV (postoperative nausea and vomiting).  PSH:    Past Surgical History:  Procedure Laterality Date  . ANTERIOR AND POSTERIOR REPAIR  11/30/2011   Procedure: ANTERIOR (CYSTOCELE) AND POSTERIOR REPAIR (RECTOCELE);  Surgeon: Reece Packer, MD;  Location: [facility];  Service: Urology;  Laterality: N/A;  with cysto  . BLADDER SUSPENSION  11/30/2011   Procedure: [facility] PROCEDURE;  Surgeon: Reece Packer, MD;  Location: [facility];  Service: Urology;  Laterality: N/A;  graft 10x6  . BREAST BIOPSY Right 2007   negative core  . BREAST BIOPSY Left 09/16/2015   DCIS  . BREAST LUMPECTOMY  Left 09/30/2015   DCIS clear margins and rad tx  . BREAST LUMPECTOMY WITH NEEDLE LOCALIZATION Left 09/30/2015   Procedure: BREAST LUMPECTOMY WITH NEEDLE LOCALIZATION;  Surgeon: Robert Bellow, MD;  Location: [facility];  Service: General;  Laterality: Left;  . CESAREAN SECTION     x 1  . COLONOSCOPY   2009  . CYSTOSCOPY  11/30/2011   Procedure: CYSTOSCOPY;  Surgeon: Reece Packer, MD;  Location: [facility];  Service: Urology;;  . Brigitte Pulse AND CURETTAGE OF UTERUS  05/05/2007  . GALLBLADDER SURGERY  2004  . TONSILLECTOMY  1961  . VAGINAL HYSTERECTOMY  11/30/2011   Procedure: HYSTERECTOMY VAGINAL;  Surgeon: Emily Filbert, MD;  Location: [facility];  Service: Gynecology;  Laterality: N/A;    Current Outpatient Prescriptions  Medication Sig Dispense Refill  . amiodarone (PACERONE) 200 MG tablet TAKE 1 TABLET TWICE A DAY AS NEEDED 60 tablet 3  . Calcium Carbonate-Vitamin D (CALTRATE 600+D) 600-400 MG-UNIT per tablet Take 1 tablet by mouth daily.     . Clindamycin Phosphate foam Apply 1 Units topically as needed. For rosacea    5  . cyanocobalamin 500 MCG tablet Take 500 mcg by mouth daily.    . DULoxetine (CYMBALTA) 60 MG capsule TAKE ONE CAPSULE DAILY 30 capsule 5  . FINACEA 15 % FOAM     . gabapentin (NEURONTIN) 100 MG capsule Take 1 capsule (100 mg total) by mouth 2 (two) times daily. 180 capsule 1  . magnesium oxide (MAG-OX) 400 MG tablet Take 400 mg by mouth every morning.     . metaxalone (SKELAXIN) 800 MG tablet Take 1 tablet (800 mg total) by mouth at bedtime as needed for muscle spasms. 90 tablet 2  . mometasone (NASONEX) 50 MCG/ACT nasal spray Place 2 sprays into the nose every morning.     . Multiple Vitamin (MULTIVITAMIN) tablet Take 1 tablet by mouth daily.    . naratriptan (AMERGE) 2.5 MG tablet Take 1 tablet (2.5 mg total) by mouth as needed. Take one (1) tablet at onset of headache; if returns or does not resolve, may repeat after 4 hours; do not exceed five (5) mg in 24 hours. 9 tablet 2  . Probiotic Product (PROBIOTIC PO) Take 1 capsule by mouth daily.     . ranitidine (ZANTAC) 150 MG capsule Take 150 mg by mouth as needed for heartburn.    . tamsulosin (FLOMAX) 0.4 MG CAPS capsule Take 0.8 mg by mouth daily after breakfast.      No current facility-administered medications for  this visit.      Allergies:   Adhesive [tape]; Levaquin  [levofloxacin]; Macrodantin [nitrofurantoin]; Metronidazole; Nitrofurantoin macrocrystal; Sulfamethoxazole-trimethoprim; Cephalexin; Clindamycin hcl; Doxycycline; and Erythromycin   Social History:  The patient  reports that she has never smoked. She has never used smokeless tobacco. She reports that she drinks alcohol. She reports that she does not use drugs.   Family History:   family history includes Asthma in her daughter, daughter, and daughter; Colitis in her daughter; Diabetes in her mother; Fibromyalgia in her daughter; Hearing loss in her mother; Heart disease in her father and paternal grandfather; Hypertension in her mother; Interstitial cystitis in her daughter; Migraines in her daughter.    Review of Systems: Review of Systems  Constitutional: Negative.   Respiratory: Negative.   Cardiovascular: Negative.   Gastrointestinal: Negative.   Musculoskeletal: Negative.   Neurological: Negative.   Psychiatric/Behavioral: Negative.   All other systems reviewed and are negative.  PHYSICAL EXAM: VS:  BP 120/70 (BP Location: Left Arm, Patient Position: Sitting, Cuff Size: Normal)   Pulse (!) 58   Ht 5\' 6"  (1.676 m)   Wt 212 lb 4 oz (96.3 kg)   BMI 34.26 kg/m  , BMI Body mass index is 34.26 kg/m. GEN: Well nourished, well developed, in no acute distress  HEENT: normal  Neck: no JVD, carotid bruits, or masses Cardiac: RRR; no murmurs, rubs, or gallops,no edema  Respiratory:  clear to auscultation bilaterally, normal work of breathing GI: soft, nontender, nondistended, + BS MS: no deformity or atrophy  Skin: warm and dry, no rash Neuro:  Strength and sensation are intact Psych: euthymic mood, full affect    Recent Labs: 01/05/2016: ALT 11; BUN 20; Creat 0.90; Hemoglobin 13.1; Platelets 286; Potassium 4.7; Sodium 140 04/28/2016: TSH 1.81    Lipid Panel Lab Results  Component Value Date   CHOL 177 01/05/2016    HDL 57 01/05/2016   LDLCALC 105 (H) 01/05/2016   TRIG 76 01/05/2016      Wt Readings from Last 3 Encounters:  09/13/16 212 lb 4 oz (96.3 kg)  07/13/16 214 lb 3 oz (97.2 kg)  05/26/16 209 lb (94.8 kg)       ASSESSMENT AND PLAN:  Atrial flutter, unspecified type ([place]) - Plan: EKG 12-Lead Long discussion with her, recently doing well, stopped her amiodarone out of fear of side effects including tremor and constipation  we did discuss possible ablation for recurrent episodes, she is interested Recommended she come into the office when she has her next episode, we will try to obtain EKG documenting atrial flutter. Other option would be 30 day monitor Currently not on anticoagulation. Previously discussed with her. declined in the past  Hyperlipidemia Long discussion concerning cholesterol management  Recommended weight loss, no indication for cholesterol medication unless she prefers aggressive management Cholesterol down to 170 with dietary changes  Disposition:   F/U  12 months   Total encounter time more than 25 minutes  Greater than 50% was spent in counseling and coordination of care with the patient    Orders Placed This Encounter  Procedures  . EKG 12-Lead     Signed, Esmond Plants, M.D., Ph.D. 09/13/2016  [place], [place]

## 2016-09-13 ENCOUNTER — Encounter: Payer: Self-pay | Admitting: Cardiovascular Disease

## 2016-09-13 ENCOUNTER — Ambulatory Visit (INDEPENDENT_AMBULATORY_CARE_PROVIDER_SITE_OTHER): Payer: 59 | Admitting: Cardiovascular Disease

## 2016-09-13 ENCOUNTER — Ambulatory Visit: Payer: 59 | Admitting: General Surgery

## 2016-09-13 VITALS — BP 120/70 | HR 58 | Ht 66.0 in | Wt 212.2 lb

## 2016-09-13 DIAGNOSIS — E782 Mixed hyperlipidemia: Secondary | ICD-10-CM | POA: Diagnosis not present

## 2016-09-13 DIAGNOSIS — E119 Type 2 diabetes mellitus without complications: Secondary | ICD-10-CM | POA: Diagnosis not present

## 2016-09-13 DIAGNOSIS — I4892 Unspecified atrial flutter: Secondary | ICD-10-CM | POA: Diagnosis not present

## 2016-09-13 NOTE — Patient Instructions (Signed)

## 2016-09-15 ENCOUNTER — Other Ambulatory Visit: Payer: Self-pay | Admitting: Family Medicine

## 2016-09-15 ENCOUNTER — Encounter: Payer: Self-pay | Admitting: General Surgery

## 2016-09-15 ENCOUNTER — Ambulatory Visit (INDEPENDENT_AMBULATORY_CARE_PROVIDER_SITE_OTHER): Payer: 59 | Admitting: General Surgery

## 2016-09-15 VITALS — BP 118/70 | HR 60 | Ht 67.0 in | Wt 212.0 lb

## 2016-09-15 DIAGNOSIS — G43009 Migraine without aura, not intractable, without status migrainosus: Secondary | ICD-10-CM

## 2016-09-15 DIAGNOSIS — N6001 Solitary cyst of right breast: Secondary | ICD-10-CM

## 2016-09-15 DIAGNOSIS — D0512 Intraductal carcinoma in situ of left breast: Secondary | ICD-10-CM | POA: Diagnosis not present

## 2016-09-15 NOTE — Patient Instructions (Signed)
Patient will be asked to return to the office in one year with a bilateral diagnotic mammogram. The patient is aware to call back for any questions or concerns. 

## 2016-09-15 NOTE — Progress Notes (Signed)
Patient ID: Sonya Lopez, female   DOB: [birth year], 61 y.o.   MRN: [id number]  Chief Complaint  Patient presents with  . Follow-up    mammogram     HPI [name] is a 61 y.o. female who presents for a breast evaluation. The most recent mammogram and right breast ultrasound was done on 09/07/2016.  Patient does perform regular self breast checks and gets regular mammograms done.    HPI  Past Medical History:  Diagnosis Date  . Allergy   . Anxiety    situational anxiety  . Arrhythmia    A-fib/A-flutter  . Atrial flutter ([place])   . Breast cancer ([place]) 09/2015   left breast cancer with lumpectomy and rad tx  . Breast neoplasm, Tis (DCIS), left 10/2015   ER 50%, PR 11-50%.  . Depression   . Difficult intubation 2009   [place] had to use  'boogee"  . Female cystocele 01/05/2011  . Fibromyalgia   . GERD (gastroesophageal reflux disease)   . Headache(784.0)   . Kidney stones   . Obesity   . Personal history of radiation therapy   . PONV (postoperative nausea and vomiting)    h/o difficult intubation and anes had to use "boogee"    Past Surgical History:  Procedure Laterality Date  . ANTERIOR AND POSTERIOR REPAIR  11/30/2011   Procedure: ANTERIOR (CYSTOCELE) AND POSTERIOR REPAIR (RECTOCELE);  Surgeon: Reece Packer, MD;  Location: [facility];  Service: Urology;  Laterality: N/A;  with cysto  . BLADDER SUSPENSION  11/30/2011   Procedure: [facility] PROCEDURE;  Surgeon: Reece Packer, MD;  Location: [facility];  Service: Urology;  Laterality: N/A;  graft 10x6  . BREAST BIOPSY Right 2007   negative core  . BREAST BIOPSY Left 09/16/2015   DCIS  . BREAST LUMPECTOMY Left 09/30/2015   DCIS clear margins and rad tx  . BREAST LUMPECTOMY WITH NEEDLE LOCALIZATION Left 09/30/2015   Procedure: BREAST LUMPECTOMY WITH NEEDLE LOCALIZATION;  Surgeon: Robert Bellow, MD;  Location: [facility];  Service: General;  Laterality: Left;  . CESAREAN SECTION     x  1  . COLONOSCOPY  2009  . CYSTOSCOPY  11/30/2011   Procedure: CYSTOSCOPY;  Surgeon: Reece Packer, MD;  Location: [facility];  Service: Urology;;  . Brigitte Pulse AND CURETTAGE OF UTERUS  05/05/2007  . GALLBLADDER SURGERY  2004  . TONSILLECTOMY  1961  . VAGINAL HYSTERECTOMY  11/30/2011   Procedure: HYSTERECTOMY VAGINAL;  Surgeon: Emily Filbert, MD;  Location: [facility];  Service: Gynecology;  Laterality: N/A;    Family History  Problem Relation Age of Onset  . Diabetes Mother   . Hearing loss Mother   . Hypertension Mother   . Heart disease Father   . Heart disease Paternal Grandfather   . Asthma Daughter   . Asthma Daughter   . Migraines Daughter   . Fibromyalgia Daughter   . Interstitial cystitis Daughter   . Asthma Daughter   . Colitis Daughter   . Breast cancer Neg Hx     Social History Social History  Substance Use Topics  . Smoking status: Never Smoker  . Smokeless tobacco: Never Used  . Alcohol use 0.0 oz/week     Comment: rare    Allergies  Allergen Reactions  . Adhesive [Tape] Dermatitis    SKIN TURNS RED-PAPER TAPE OK TO USE  . Levaquin  [Levofloxacin]   . Macrodantin [Nitrofurantoin] Nausea And Vomiting  . Metronidazole Nausea  Only  . Nitrofurantoin Macrocrystal Nausea And Vomiting  . Sulfamethoxazole-Trimethoprim Nausea Only  . Cephalexin Rash  . Clindamycin Hcl Rash  . Doxycycline Swelling and Rash  . Erythromycin Rash    Current Outpatient Prescriptions  Medication Sig Dispense Refill  . amiodarone (PACERONE) 200 MG tablet TAKE 1 TABLET TWICE A DAY AS NEEDED 60 tablet 3  . Calcium Carbonate-Vitamin D (CALTRATE 600+D) 600-400 MG-UNIT per tablet Take 1 tablet by mouth daily.     . Clindamycin Phosphate foam Apply 1 Units topically as needed. For rosacea    5  . cyanocobalamin 500 MCG tablet Take 500 mcg by mouth daily.    . DULoxetine (CYMBALTA) 60 MG capsule TAKE ONE CAPSULE DAILY 30 capsule 5  . FINACEA 15 % FOAM     . gabapentin (NEURONTIN) 100 MG  capsule Take 1 capsule (100 mg total) by mouth 2 (two) times daily. 180 capsule 1  . magnesium oxide (MAG-OX) 400 MG tablet Take 400 mg by mouth every morning.     . metaxalone (SKELAXIN) 800 MG tablet Take 1 tablet (800 mg total) by mouth at bedtime as needed for muscle spasms. 90 tablet 2  . mometasone (NASONEX) 50 MCG/ACT nasal spray Place 2 sprays into the nose every morning.     . Multiple Vitamin (MULTIVITAMIN) tablet Take 1 tablet by mouth daily.    . naratriptan (AMERGE) 2.5 MG tablet Take 1 tablet (2.5 mg total) by mouth as needed. Take one (1) tablet at onset of headache; if returns or does not resolve, may repeat after 4 hours; do not exceed five (5) mg in 24 hours. 9 tablet 2  . Probiotic Product (PROBIOTIC PO) Take 1 capsule by mouth daily.     . ranitidine (ZANTAC) 150 MG capsule Take 150 mg by mouth as needed for heartburn.    . tamsulosin (FLOMAX) 0.4 MG CAPS capsule Take 0.8 mg by mouth daily after breakfast.      No current facility-administered medications for this visit.     Review of Systems Review of Systems  Constitutional: Negative.   Respiratory: Negative.   Cardiovascular: Negative.     Blood pressure 118/70, pulse 60, height 5\' 7"  (1.702 m), weight 212 lb (96.2 kg).  Physical Exam Physical Exam  Constitutional: She is oriented to person, place, and time. She appears well-developed and well-nourished.  Eyes: Conjunctivae are normal. No scleral icterus.  Neck: Neck supple.  Cardiovascular: Normal rate, regular rhythm and normal heart sounds.   Pulmonary/Chest: Effort normal and breath sounds normal. Right breast exhibits no inverted nipple, no mass, no nipple discharge, no skin change and no tenderness. Left breast exhibits no inverted nipple, no mass, no nipple discharge, no skin change and no tenderness.    Abdominal: Soft. Bowel sounds are normal. There is no tenderness.  Lymphadenopathy:    She has no cervical adenopathy.    She has no axillary  adenopathy.  Neurological: She is alert and oriented to person, place, and time.  Skin: Skin is warm and dry.    Data Reviewed Mammograms and ultrasound of the right breast dated 09/07/2016 reviewed. BI-RADS-2.  Minimal scarring at the site of wide excision and MammoSite treatment.  Assessment    No evidence of recurrent DCIS.    Plan    The patient has been unable to tolerate tamoxifen secondary to leg cramps or Aromasin secondary to hot flashes in spite of Effexor. We'll continue annual clinical exam and mammographic exams.  Breast cysts are smaller than  noted the time of her office ultrasound in April 2018. As they are asymptomatic no intervention is required.    Patient will be asked to return to the office in one year with a bilateral diagnotic mammogram. The patient is aware to call back for any questions or concerns.   HPI, Physical Exam, Assessment and Plan have been scribed under the direction and in the presence of Hervey Ard, MD.  Gaspar Cola, CMA  I have completed the exam and reviewed the above documentation for accuracy and completeness.  I agree with the above.  Haematologist has been used and any errors in dictation or transcription are unintentional.  Hervey Ard, M.D., F.A.C.S.  Robert Bellow 09/15/2016, 9:19 AM

## 2016-09-15 NOTE — Telephone Encounter (Signed)
Patient requesting refill of Amerge to [facility].

## 2016-09-30 IMAGING — MG MM BREAST SURGICAL SPECIMEN
1 series · 1 of 1 positions shown · non-contrast
Comparison: Previous exam(s).

CLINICAL DATA: Left lumpectomy for recently diagnosed ductal
carcinoma in situ.

EXAM:
SPECIMEN RADIOGRAPH OF THE LEFT BREAST

[L SPECIMEN]
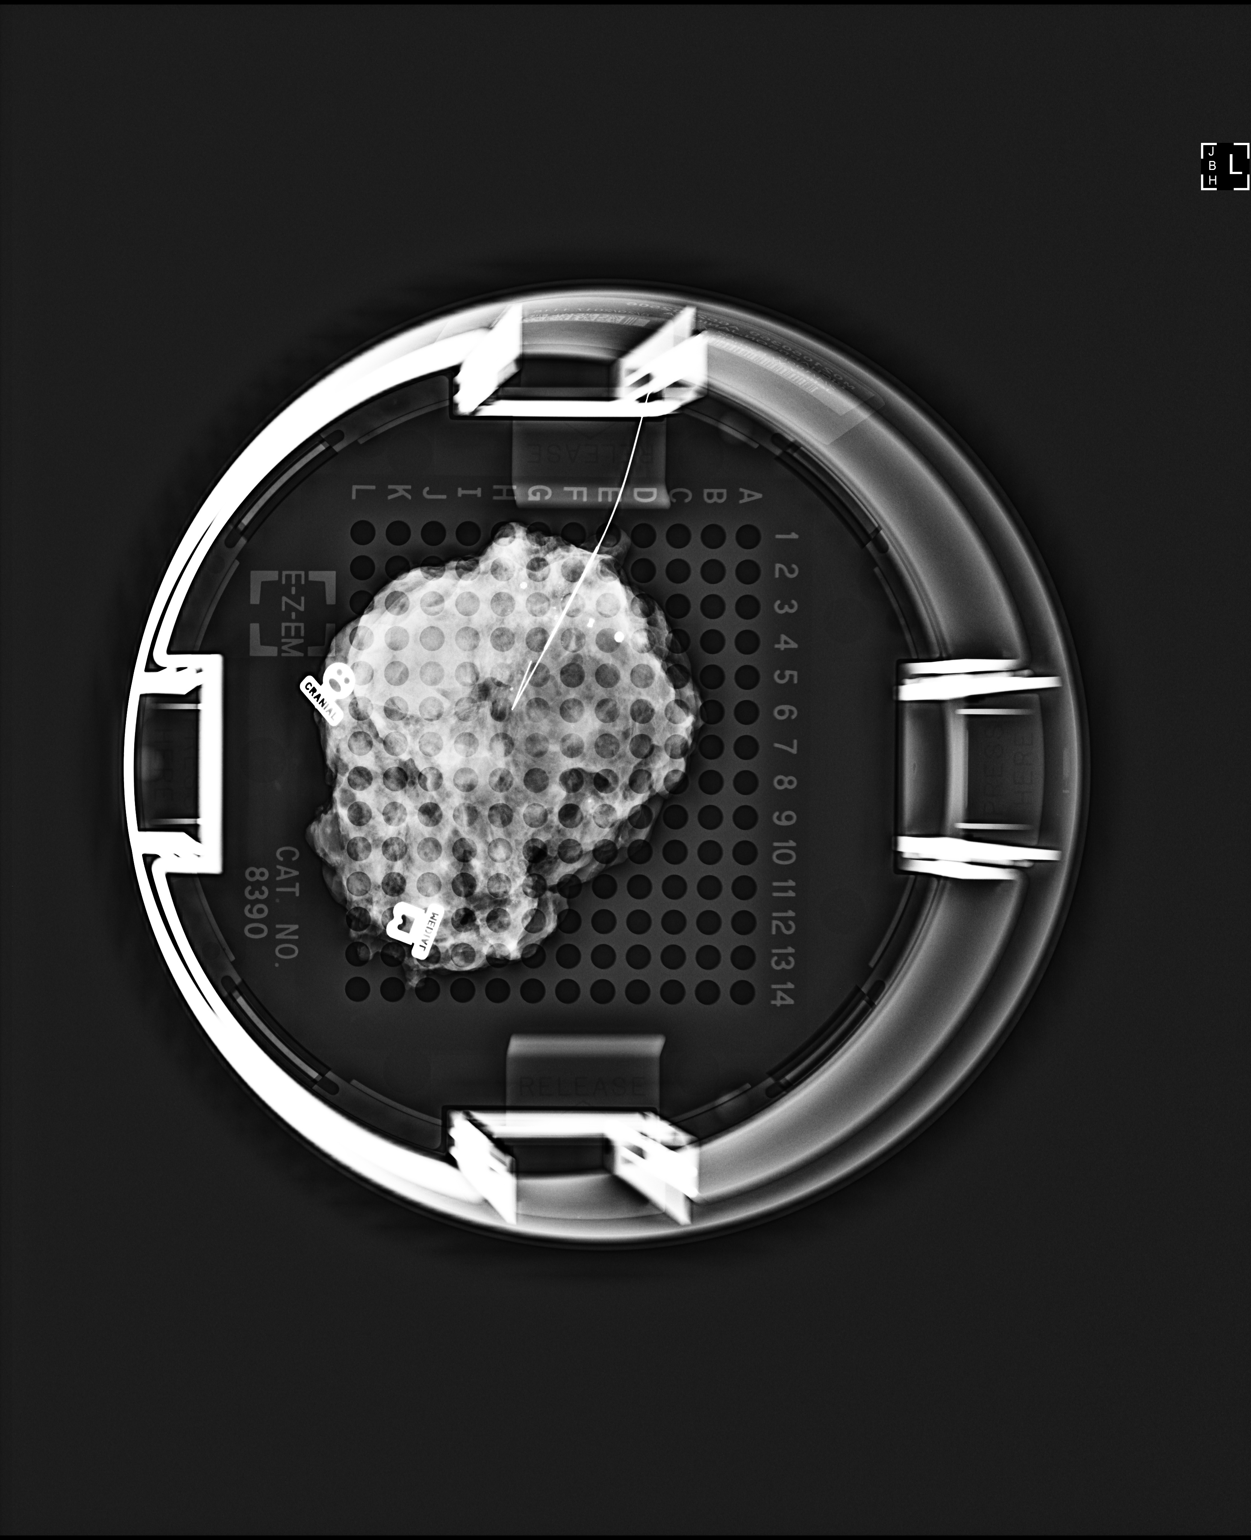

[1 of 1 positions shown; findings below may reference images not displayed]

FINDINGS: Status post excision of the left breast. The wire tip, biopsy marker
clip and residual calcifications are present.
IMPRESSION: Specimen radiograph of the left breast.

## 2016-10-22 ENCOUNTER — Encounter: Payer: Self-pay | Admitting: General Surgery

## 2016-10-27 ENCOUNTER — Other Ambulatory Visit: Payer: Self-pay | Admitting: Cardiovascular Disease

## 2016-11-03 ENCOUNTER — Encounter: Payer: Self-pay | Admitting: Radiation Oncology

## 2016-11-03 ENCOUNTER — Ambulatory Visit
Admission: RE | Admit: 2016-11-03 | Discharge: 2016-11-03 | Disposition: A | Discharge status: Home / Self Care | Payer: 59 | Source: Ambulatory Visit | Attending: Radiation Oncology | Admitting: Radiation Oncology

## 2016-11-03 VITALS — BP 114/72 | HR 65 | Temp 98.2°F | Resp 20 | Wt 209.4 lb

## 2016-11-03 DIAGNOSIS — Z170 Estrogen receptor positive status [ER+]: Secondary | ICD-10-CM

## 2016-11-03 DIAGNOSIS — Z923 Personal history of irradiation: Secondary | ICD-10-CM | POA: Insufficient documentation

## 2016-11-03 DIAGNOSIS — Z86000 Personal history of in-situ neoplasm of breast: Secondary | ICD-10-CM | POA: Diagnosis not present

## 2016-11-03 DIAGNOSIS — C50412 Malignant neoplasm of upper-outer quadrant of left female breast: Secondary | ICD-10-CM

## 2016-11-03 NOTE — Progress Notes (Signed)
Radiation Oncology Follow up Note  Name: Sonya Lopez   Date:   11/03/2016 MRN:  [id number] DOB: [birth year]    This 61 y.o. female presents to the clinic today for 1 year follow-up status post accelerated partial breast radiation to her left breast for ductal carcinoma in situ.  REFERRING PROVIDER: Steele Sizer, MD  HPI: patient is a 61 year old female now out 1 year having completed accelerated partial breast irradiation to her left breast for ER/PR positive ductal carcinoma in situ. She is seen today in routine follow-up and is doing well. She specifically denies breast tenderness cough or bone pain..her last mammogram and ultrasound was performed in August was BI-RADS 2 benign which I have personally reviewed. She was tried on antiestrogen therapy although after 2 trials of different medications she has discontinued based on side effect profile.  COMPLICATIONS OF TREATMENT: none  FOLLOW UP COMPLIANCE: keeps appointments   PHYSICAL EXAM:  BP 114/72   Pulse 65   Temp 98.2 F (36.8 C)   Resp 20   Wt 209 lb 7 oz (95 kg)   BMI 32.80 kg/m  Lungs are clear to A&P cardiac examination essentially unremarkable with regular rate and rhythm. No dominant mass or nodularity is noted in either breast in 2 positions examined. Incision is well-healed. No axillary or supraclavicular adenopathy is appreciated. Cosmetic result is excellent. Well-developed well-nourished patient in NAD. HEENT reveals PERLA, EOMI, discs not visualized.  Oral cavity is clear. No oral mucosal lesions are identified. Neck is clear without evidence of cervical or supraclavicular adenopathy. Lungs are clear to A&P. Cardiac examination is essentially unremarkable with regular rate and rhythm without murmur rub or thrill. Abdomen is benign with no organomegaly or masses noted. Motor sensory and DTR levels are equal and symmetric in the upper and lower extremities. Cranial nerves II through XII are grossly intact.  Proprioception is intact. No peripheral adenopathy or edema is identified. No motor or sensory levels are noted. Crude visual fields are within normal range.  RADIOLOGY RESULTS: mammograms and ultrasound reviewed  PLAN: present time patient continues to do well with no evidence of disease now out 1 year. I've asked to see her back in 1 year for follow-up. She or he has follow-up mammogram scheduled. Patient is to call at anytime with any concerns.  I would like to take this opportunity to thank you for allowing me to participate in the care of your patient.Armstead Peaks., MD

## 2016-11-30 ENCOUNTER — Other Ambulatory Visit: Payer: Self-pay | Admitting: Family Medicine

## 2016-11-30 DIAGNOSIS — F320 Major depressive disorder, single episode, mild: Secondary | ICD-10-CM

## 2016-11-30 NOTE — Telephone Encounter (Addendum)
Refill request for general medication: Duloxetine  Last office visit: 07/13/2016  Last physical exam: none indicated  Follow up Appt: 01/12/2017

## 2016-12-10 ENCOUNTER — Telehealth: Payer: Self-pay | Admitting: Cardiovascular Disease

## 2016-12-10 DIAGNOSIS — I4892 Unspecified atrial flutter: Secondary | ICD-10-CM

## 2016-12-10 NOTE — Telephone Encounter (Signed)
Spoke with patient and she brought in 2 EKG reports from her [facility] and she wanted Dr. Rockey Situ to review and proceed with possible ablation. Let her know that I would pass this on for Dr. Rockey Situ to review and then I would be in touch. She was appreciative for the call and had no further questions at this time.

## 2016-12-10 NOTE — Telephone Encounter (Signed)
Pt dropped off EKGs Dr. Rockey Situ requested Placed in Nurse Box

## 2016-12-14 ENCOUNTER — Other Ambulatory Visit: Payer: Self-pay | Admitting: Cardiovascular Disease

## 2016-12-15 NOTE — Telephone Encounter (Signed)
I reviewed the strips that she dropped off There is some concern of atrial fibrillation which is different from atrial flutter Ablation would be different location in the heart and atrial fibrillation ablation is sometimes more difficult Would recommend a monitor either 2-week or 30-day to identify whether she is having flutter or ablation

## 2016-12-16 NOTE — Telephone Encounter (Signed)
Spoke with patient and reviewed Dr. Donivan Scull recommendations. She verbalized understanding and was agreeable to wear monitor. Reviewed both monitors available and order entered for Zio 2 week monitor to be placed. Advised that I would have scheduling call to set up appointment to wear monitor. She verbalized understanding of our conversation, agreement with plan, and had no further questions at this time.

## 2016-12-29 ENCOUNTER — Ambulatory Visit (INDEPENDENT_AMBULATORY_CARE_PROVIDER_SITE_OTHER): Payer: 59

## 2016-12-29 DIAGNOSIS — I4892 Unspecified atrial flutter: Secondary | ICD-10-CM | POA: Diagnosis not present

## 2017-01-12 ENCOUNTER — Ambulatory Visit: Payer: 59 | Admitting: Family Medicine

## 2017-01-18 DIAGNOSIS — I480 Paroxysmal atrial fibrillation: Secondary | ICD-10-CM | POA: Diagnosis not present

## 2017-01-19 ENCOUNTER — Encounter: Payer: Self-pay | Admitting: Family Medicine

## 2017-02-02 DIAGNOSIS — D3141 Benign neoplasm of right ciliary body: Secondary | ICD-10-CM | POA: Diagnosis not present

## 2017-02-03 ENCOUNTER — Other Ambulatory Visit: Payer: Self-pay | Admitting: Family Medicine

## 2017-02-03 DIAGNOSIS — F320 Major depressive disorder, single episode, mild: Secondary | ICD-10-CM

## 2017-02-03 NOTE — Telephone Encounter (Signed)
Refill request for general medication. Duloxetine to [facility].   Last office visit: 07/13/2016  Follow up on 02/10/2017

## 2017-02-10 ENCOUNTER — Telehealth: Payer: Self-pay | Admitting: Cardiovascular Disease

## 2017-02-10 ENCOUNTER — Encounter: Payer: Self-pay | Admitting: Family Medicine

## 2017-02-10 ENCOUNTER — Ambulatory Visit: Payer: 59 | Admitting: Family Medicine

## 2017-02-10 VITALS — BP 110/60 | HR 66 | Resp 16 | Ht 63.0 in | Wt 202.6 lb

## 2017-02-10 DIAGNOSIS — Z1211 Encounter for screening for malignant neoplasm of colon: Secondary | ICD-10-CM

## 2017-02-10 DIAGNOSIS — E119 Type 2 diabetes mellitus without complications: Secondary | ICD-10-CM

## 2017-02-10 DIAGNOSIS — M542 Cervicalgia: Secondary | ICD-10-CM

## 2017-02-10 DIAGNOSIS — I4892 Unspecified atrial flutter: Secondary | ICD-10-CM | POA: Diagnosis not present

## 2017-02-10 DIAGNOSIS — M25551 Pain in right hip: Secondary | ICD-10-CM | POA: Diagnosis not present

## 2017-02-10 DIAGNOSIS — Z853 Personal history of malignant neoplasm of breast: Secondary | ICD-10-CM

## 2017-02-10 DIAGNOSIS — M5416 Radiculopathy, lumbar region: Secondary | ICD-10-CM

## 2017-02-10 DIAGNOSIS — E538 Deficiency of other specified B group vitamins: Secondary | ICD-10-CM

## 2017-02-10 DIAGNOSIS — Z79899 Other long term (current) drug therapy: Secondary | ICD-10-CM

## 2017-02-10 DIAGNOSIS — E559 Vitamin D deficiency, unspecified: Secondary | ICD-10-CM

## 2017-02-10 DIAGNOSIS — Z23 Encounter for immunization: Secondary | ICD-10-CM | POA: Diagnosis not present

## 2017-02-10 DIAGNOSIS — R251 Tremor, unspecified: Secondary | ICD-10-CM

## 2017-02-10 DIAGNOSIS — F320 Major depressive disorder, single episode, mild: Secondary | ICD-10-CM | POA: Diagnosis not present

## 2017-02-10 DIAGNOSIS — E1169 Type 2 diabetes mellitus with other specified complication: Secondary | ICD-10-CM | POA: Diagnosis not present

## 2017-02-10 DIAGNOSIS — J3089 Other allergic rhinitis: Secondary | ICD-10-CM

## 2017-02-10 DIAGNOSIS — M797 Fibromyalgia: Secondary | ICD-10-CM

## 2017-02-10 DIAGNOSIS — G43009 Migraine without aura, not intractable, without status migrainosus: Secondary | ICD-10-CM | POA: Diagnosis not present

## 2017-02-10 DIAGNOSIS — M25552 Pain in left hip: Secondary | ICD-10-CM

## 2017-02-10 DIAGNOSIS — E785 Hyperlipidemia, unspecified: Secondary | ICD-10-CM

## 2017-02-10 MED ORDER — APIXABAN 5 MG PO TABS: 5.0000 mg | ORAL_TABLET | Freq: Two times a day (BID) | ORAL | 11 refills | Status: DC

## 2017-02-10 MED ORDER — ZOSTER VAC RECOMB ADJUVANTED 50 MCG/0.5ML IM SUSR: 0.5000 mL | Freq: Once | INTRAMUSCULAR | 0 refills | Status: AC

## 2017-02-10 MED ORDER — METAXALONE 800 MG PO TABS: 800.0000 mg | ORAL_TABLET | Freq: Every evening | ORAL | 2 refills | Status: DC | PRN

## 2017-02-10 MED ORDER — DULOXETINE HCL 60 MG PO CPEP: 60.0000 mg | ORAL_CAPSULE | Freq: Every day | ORAL | 1 refills | Status: DC

## 2017-02-10 MED ORDER — GABAPENTIN 100 MG PO CAPS: 100.0000 mg | ORAL_CAPSULE | Freq: Two times a day (BID) | ORAL | 1 refills | Status: DC

## 2017-02-10 MED ORDER — LEVOCETIRIZINE DIHYDROCHLORIDE 5 MG PO TABS: 5.0000 mg | ORAL_TABLET | Freq: Every evening | ORAL | 0 refills | Status: DC

## 2017-02-10 NOTE — Telephone Encounter (Signed)
The patient is aware of her monitor results. She is agreeable to starting eliquis 5 mg BID. Her question was if she needed to take propranolol daily. She currently has propranolol 10 mg to take TID PRN. She also has amiodarone 200 mg BID PRN. I advised her I will clarify with Dr. Rockey Situ, but her baseline HR's typically run around 60 bpm so she may need to continue PRN dosing. She is aware I will pull samples and a coupon card for eliquis for her and put this at the front desk for pick up along with any other recommendations regarding her medications.  She voices understanding.

## 2017-02-10 NOTE — Addendum Note (Signed)
Addended by: Chilton Greathouse on: 02/10/2017 11:09 AM   Modules accepted: Orders

## 2017-02-10 NOTE — Progress Notes (Signed)
Name: Sonya Lopez   MRN: [id number]    DOB: 03-21-1955   Date:02/10/2017       Progress Note  Subjective  Chief Complaint  Chief Complaint  Patient presents with  . Migraine  . Hyperlipidemia  . Depression  . Fibromyalgia    HPI   FMS: She states that Cymbalta and Gabapentin seems to be helping with symptoms. Pain is still daily, pain  HIp pain: left side is chronic, right side is now radiating down right leg, and also constant a pain up to 8/10. Denies weakness on lower extremity. She also has neck pain left side, discussed options and she would like to see Ortho   Atrial Flutter: Episodes are stable with Amiodarone, but currently only taking Amiodarone prn and tolerating it well.  She is has propranolol prn only when . HR goes up to 150's - seeing Dr. Rockey Situ. TSH normal and is due for repeat level.  Migraine Headaches: episodes down to a few times a monthusually on left maxillary area and radiates to left forehead and down to left side of neck, Axert kicks in within 40 minutes, sometimes associated with nausea, but no vomiting. At most one episode per week.   Mild Major Depression: she is still taking Duloxetine, she is currently worried about her daughter that will have partial thyroidectomy, mother recently hospitalized and is in a nursing home. She denies anhedonia or hopelessness.   DMII: she is on diet only and hgbA1C is under control, urine micro was negative last visit, but is due for repeat  She denies polyphagia, no polyuria or polydipsia. Eye exam is up to date, foot exam today   GERD: under control with medication, she tries alternating Prilosec and Zantac.  History of left breast cancer: she had lumpectomy, could not tolerate estrogen suppressive therapy.   Obesity: she has changed her diet and is exercise more has lost 10 lbs since 09/2016  Patient Active Problem List   Diagnosis Date Noted  . Benign breast cyst in female, right 05/25/2016  .  History of left breast cancer 09/16/2015  . Long-term use of high-risk medication 02/25/2015  . Increased urinary frequency 08/01/2014  . Mild major depression ([place]) 07/29/2014  . Gastric reflux 07/29/2014  . Chronic interstitial cystitis 07/29/2014  . Calculus of kidney 07/29/2014  . Allergic rhinitis 07/29/2014  . Acne erythematosa 07/29/2014  . Lumbosacral radiculitis 07/29/2014  . Tinnitus 07/29/2014  . Mixed incontinence, urge and stress (female) (female) 08/23/2013  . Hyperlipidemia 04/25/2013  . H/O: hysterectomy 11/30/2011  . Atrial flutter ([place]) 05/03/2011  . Diet-controlled type 2 diabetes mellitus ([place]) 04/16/2011  . Bladder cystocele 01/05/2011  . Migraine without aura and without status migrainosus, not intractable 12/29/2010  . Fibromyalgia 12/29/2010  . Obesity 12/29/2010    Past Surgical History:  Procedure Laterality Date  . ANTERIOR AND POSTERIOR REPAIR  11/30/2011   Procedure: ANTERIOR (CYSTOCELE) AND POSTERIOR REPAIR (RECTOCELE);  Surgeon: Reece Packer, MD;  Location: [facility];  Service: Urology;  Laterality: N/A;  with cysto  . BLADDER SUSPENSION  11/30/2011   Procedure: [facility] PROCEDURE;  Surgeon: Reece Packer, MD;  Location: [facility];  Service: Urology;  Laterality: N/A;  graft 10x6  . BREAST BIOPSY Right 2007   negative core  . BREAST BIOPSY Left 09/16/2015   DCIS  . BREAST LUMPECTOMY Left 09/30/2015   DCIS clear margins and rad tx  . BREAST LUMPECTOMY WITH NEEDLE LOCALIZATION Left 09/30/2015   Procedure: BREAST LUMPECTOMY WITH NEEDLE LOCALIZATION;  Surgeon: Robert Bellow, MD;  Location: [facility];  Service: General;  Laterality: Left;  . CESAREAN SECTION     x 1  . COLONOSCOPY  2009  . CYSTOSCOPY  11/30/2011   Procedure: CYSTOSCOPY;  Surgeon: Reece Packer, MD;  Location: [facility];  Service: Urology;;  . Brigitte Pulse AND CURETTAGE OF UTERUS  05/05/2007  . GALLBLADDER SURGERY  2004  . TONSILLECTOMY  1961  . VAGINAL HYSTERECTOMY  11/30/2011    Procedure: HYSTERECTOMY VAGINAL;  Surgeon: Emily Filbert, MD;  Location: [facility];  Service: Gynecology;  Laterality: N/A;    Family History  Problem Relation Age of Onset  . Diabetes Mother   . Hearing loss Mother   . Hypertension Mother   . Heart disease Father   . Heart disease Paternal Grandfather   . Asthma Daughter   . Asthma Daughter   . Migraines Daughter   . Fibromyalgia Daughter   . Interstitial cystitis Daughter   . Asthma Daughter   . Colitis Daughter   . Breast cancer Neg Hx     Social History   Socioeconomic History  . Marital status: Married    Spouse name: Not on file  . Number of children: Not on file  . Years of education: Not on file  . Highest education level: Not on file  Social Needs  . Financial resource strain: Not on file  . Food insecurity - worry: Not on file  . Food insecurity - inability: Not on file  . Transportation needs - medical: Not on file  . Transportation needs - non-medical: Not on file  Occupational History  . Not on file  Tobacco Use  . Smoking status: Never Smoker  . Smokeless tobacco: Never Used  Substance and Sexual Activity  . Alcohol use: Yes    Alcohol/week: 0.0 oz    Comment: rare  . Drug use: No  . Sexual activity: Yes    Partners: Male  Other Topics Concern  . Not on file  Social History Narrative  . Not on file     Current Outpatient Medications:  .  amiodarone (PACERONE) 200 MG tablet, TAKE 1 TABLET BY MOUTH TWICE DAILY AS NEEDED, Disp: 60 tablet, Rfl: 3 .  Calcium Carbonate-Vitamin D (CALTRATE 600+D) 600-400 MG-UNIT per tablet, Take 1 tablet by mouth daily. , Disp: , Rfl:  .  CAMBIA 50 MG PACK, USE 1 PACKET DAILY FOR MIGRAINES MAY REPEAT IN 15 MIN IF NO RESPONSE, Disp: , Rfl: 3 .  Clindamycin Phosphate foam, Apply 1 Units topically as needed. For rosacea  , Disp: , Rfl: 5 .  DULoxetine (CYMBALTA) 60 MG capsule, Take 1 capsule (60 mg total) by mouth daily., Disp: 90 capsule, Rfl: 1 .  FINACEA 15 % FOAM, , Disp:  , Rfl:  .  gabapentin (NEURONTIN) 100 MG capsule, Take 1 capsule (100 mg total) by mouth 2 (two) times daily., Disp: 180 capsule, Rfl: 1 .  magnesium oxide (MAG-OX) 400 MG tablet, Take 400 mg by mouth every morning. , Disp: , Rfl:  .  metaxalone (SKELAXIN) 800 MG tablet, Take 1 tablet (800 mg total) by mouth at bedtime as needed for muscle spasms., Disp: 90 tablet, Rfl: 2 .  mometasone (NASONEX) 50 MCG/ACT nasal spray, Place 2 sprays into the nose every morning. , Disp: , Rfl:  .  naratriptan (AMERGE) 2.5 MG tablet, TAKE ONE TABLET BY MOUTH AS NEEDED, Disp: 9 tablet, Rfl: 5 .  Nutritional Supplements (HEPATIC COMPLEX PO), Take by  mouth., Disp: , Rfl:  .  omeprazole (PRILOSEC) 20 MG capsule, Take 20 mg by mouth daily., Disp: , Rfl:  .  Probiotic Product (PROBIOTIC PO), Take 1 capsule by mouth daily. , Disp: , Rfl:  .  tamsulosin (FLOMAX) 0.4 MG CAPS capsule, Take 0.8 mg by mouth daily after breakfast. , Disp: , Rfl:  .  aspirin EC 81 MG tablet, Take 81 mg by mouth daily., Disp: , Rfl:  .  levocetirizine (XYZAL) 5 MG tablet, Take 1 tablet (5 mg total) by mouth every evening., Disp: 30 tablet, Rfl: 0 .  Multiple Vitamin (MULTIVITAMIN) tablet, Take 1 tablet by mouth daily., Disp: , Rfl:  .  Zoster Vaccine Adjuvanted [facility]) injection, Inject 0.5 mLs into the muscle once for 1 dose., Disp: 0.5 mL, Rfl: 0  Allergies  Allergen Reactions  . Adhesive [Tape] Dermatitis    SKIN TURNS RED-PAPER TAPE OK TO USE  . Levaquin  [Levofloxacin]   . Macrodantin [Nitrofurantoin] Nausea And Vomiting  . Metronidazole Nausea Only  . Nitrofurantoin Macrocrystal Nausea And Vomiting  . Sulfamethoxazole-Trimethoprim Nausea Only  . Cephalexin Rash  . Clindamycin Hcl Rash  . Doxycycline Swelling and Rash  . Erythromycin Rash     ROS  Constitutional: Negative for fever, positive for  weight change.  Respiratory: Negative for cough and shortness of breath.   Cardiovascular: Negative for chest pain or  palpitations.  Gastrointestinal: Negative for abdominal pain, no bowel changes.  Musculoskeletal: Positive  for gait problem but no joint swelling.  Skin: Negative for rash.  Neurological: Negative for dizziness or headache.  No other specific complaints in a complete review of systems (except as listed in HPI above).  Objective  Vitals:   02/10/17 0935  BP: 110/60  Pulse: 66  Resp: 16  SpO2: 96%  Weight: 202 lb 9.6 oz (91.9 kg)  Height: 5\' 3"  (1.6 m)    Body mass index is 35.89 kg/m.  Physical Exam  Constitutional: Patient appears well-developed and well-nourished. Obese  No distress.  HEENT: head atraumatic, normocephalic, pupils equal and reactive to light,  neck supple, throat within normal limits Cardiovascular: Normal rate, regular rhythm and normal heart sounds.  No murmur heard. No BLE edema. Pulmonary/Chest: Effort normal and breath sounds normal. No respiratory distress. Abdominal: Soft.  There is no tenderness. Psychiatric: Patient has a normal mood and affect. behavior is normal. Judgment and thought content normal. Muscular Skeletal: trigger point positive, pain during palpation of right trapezium muscle, negative straight leg raise, pain during palpation of both outer hips.   PHQ2/9: Depression screen [facility] 2/9 11/03/2016 04/28/2016 12/03/2015 11/25/2015 10/14/2015  Decreased Interest 0 0 0 0 0  Down, Depressed, Hopeless 0 0 0 0 0  PHQ - 2 Score 0 0 0 0 0     Fall Risk: Fall Risk  02/10/2017 11/03/2016 04/28/2016 11/25/2015 10/14/2015  Falls in the past year? No No Yes Yes No  Number falls in past yr: - - 1 1 -  Injury with Fall? - - No No -     Assessment & Plan  1. Mild major depression (HCC)  Under stress, but stable with Cymbalta  2. Colon cancer screening  - Cologuard  3. Diet-controlled type 2 diabetes mellitus (HCC)  - Hemoglobin A1c - Microalbumin / creatinine urine ratio - COMPLETE METABOLIC PANEL WITH GFR  4. Atrial flutter, unspecified  type ([place])  Seeing Dr. Rockey Situ   5. Fibromyalgia  Still has daily pain  6. Tremors of nervous system  Stable, off beta-blockers  7. History of left breast cancer  Still seeing Dr. Fleet Contras and Dr. Oren Bracket yearly  8. Migraine without aura and without status migrainosus, not intractable  Doing better   9. Dyslipidemia associated with type 2 diabetes mellitus (HCC)  - Lipid panel  10. Pain of both hip joints  - Ambulatory referral to Orthopedic Surgery  11. Right lumbar radiculitis  - Ambulatory referral to Orthopedic Surgery  12. Neck pain on left side  - Ambulatory referral to Orthopedic Surgery  13. Need for shingles vaccine  - Zoster Vaccine Adjuvanted [facility]) injection; Inject 0.5 mLs into the muscle once for 1 dose.  Dispense: 0.5 mL; Refill: 0  14. Need for prophylactic vaccination against diphtheria and tetanus  - Tdap vaccine greater than or equal to 7yo IM  15. Long-term use of high-risk medication  - COMPLETE METABOLIC PANEL WITH GFR - CBC With Differential  16. B12 deficiency  - Vitamin B12  17. Vitamin D deficiency  - VITAMIN D 25 Hydroxy (Vit-D Deficiency, Fractures)  18. Perennial allergic rhinitis  - levocetirizine (XYZAL) 5 MG tablet; Take 1 tablet (5 mg total) by mouth every evening.  Dispense: 30 tablet; Refill: 0

## 2017-02-10 NOTE — Telephone Encounter (Signed)
Patient states she is returning call for test results. Please call.  

## 2017-02-11 ENCOUNTER — Encounter: Payer: Self-pay | Admitting: *Deleted

## 2017-02-11 DIAGNOSIS — E119 Type 2 diabetes mellitus without complications: Secondary | ICD-10-CM | POA: Diagnosis not present

## 2017-02-11 DIAGNOSIS — E1169 Type 2 diabetes mellitus with other specified complication: Secondary | ICD-10-CM | POA: Diagnosis not present

## 2017-02-11 DIAGNOSIS — E785 Hyperlipidemia, unspecified: Secondary | ICD-10-CM | POA: Diagnosis not present

## 2017-02-11 NOTE — Addendum Note (Signed)
Addended by: Inda Coke on: 02/11/2017 08:32 AM   Modules accepted: Orders

## 2017-02-11 NOTE — Telephone Encounter (Signed)
Reviewed with Dr. Rockey Situ. The patient may continue with PRN amiodarone and propranolol at this time. She may stop aspirin per Dr. Rockey Situ.  Eliquis 5 mg  Lot: [id number] Exp: 3/21 # 3 boxes given  Also enclosed: 1) RX for eliquis 5 mg BID with a free 30-day trial card. 2) Instructions to the patient to stop aspirin, continue PRN propranolol and amiodarone, and call if episodes of elevated/ irregular heart rates become more frequent.

## 2017-02-12 LAB — MICROALBUMIN / CREATININE URINE RATIO
Creatinine, Urine: 89 mg/dL (ref 20–275)
MICROALB UR: 0.2 mg/dL
MICROALB/CREAT RATIO: 2 ug/mg{creat} (ref <30)

## 2017-02-12 LAB — CBC WITH DIFFERENTIAL/PLATELET
BASOS PCT: 0.7 %
Basophils Absolute: 43 cells/uL (ref 0–200)
Eosinophils Absolute: 73 cells/uL (ref 15–500)
Eosinophils Relative: 1.2 %
HCT: 38.6 % (ref 35.0–45.0)
Hemoglobin: 13 g/dL (ref 11.7–15.5)
Lymphs Abs: 1074 cells/uL (ref 850–3900)
MCH: 30.7 pg (ref 27.0–33.0)
MCHC: 33.7 g/dL (ref 32.0–36.0)
MCV: 91.3 fL (ref 80.0–100.0)
MPV: 10.5 fL (ref 7.5–12.5)
Monocytes Relative: 6.7 %
Neutro Abs: 4502 cells/uL (ref 1500–7800)
Neutrophils Relative %: 73.8 %
PLATELETS: 301 10*3/uL (ref 140–400)
RBC: 4.23 10*6/uL (ref 3.80–5.10)
RDW: 13.7 % (ref 11.0–15.0)
TOTAL LYMPHOCYTE: 17.6 %
WBC mixed population: 409 cells/uL (ref 200–950)
WBC: 6.1 10*3/uL (ref 3.8–10.8)

## 2017-02-12 LAB — COMPLETE METABOLIC PANEL WITH GFR
AG Ratio: 1.7 (calc) (ref 1.0–2.5)
ALBUMIN MSPROF: 4 g/dL (ref 3.6–5.1)
ALT: 11 U/L (ref 6–29)
AST: 12 U/L (ref 10–35)
Alkaline phosphatase (APISO): 138 U/L — ABNORMAL HIGH (ref 33–130)
BILIRUBIN TOTAL: 0.4 mg/dL (ref 0.2–1.2)
BUN / CREAT RATIO: 23 (calc) — AB (ref 6–22)
BUN: 23 mg/dL (ref 7–25)
CO2: 29 mmol/L (ref 20–32)
CREATININE: 1.01 mg/dL — AB (ref 0.50–0.99)
Calcium: 9 mg/dL (ref 8.6–10.4)
Chloride: 103 mmol/L (ref 98–110)
GFR, EST AFRICAN AMERICAN: 69 mL/min/{1.73_m2} (ref >=60)
GFR, Est Non African American: 59 mL/min/{1.73_m2} — ABNORMAL LOW (ref >=60)
GLUCOSE: 88 mg/dL (ref 65–99)
Globulin: 2.3 g/dL (calc) (ref 1.9–3.7)
Potassium: 4.5 mmol/L (ref 3.5–5.3)
Sodium: 140 mmol/L (ref 135–146)
Total Protein: 6.3 g/dL (ref 6.1–8.1)

## 2017-02-12 LAB — LIPID PANEL
Cholesterol: 168 mg/dL (ref <200)
HDL: 66 mg/dL (ref >50)
LDL Cholesterol (Calc): 88 mg/dL (calc)
Non-HDL Cholesterol (Calc): 102 mg/dL (calc) (ref <130)
TRIGLYCERIDES: 55 mg/dL (ref <150)
Total CHOL/HDL Ratio: 2.5 (calc) (ref <5.0)

## 2017-02-12 LAB — HEMOGLOBIN A1C
Hgb A1c MFr Bld: 5.4 % of total Hgb (ref <5.7)
Mean Plasma Glucose: 108 (calc)
eAG (mmol/L): 6 (calc)

## 2017-02-12 LAB — VITAMIN D 25 HYDROXY (VIT D DEFICIENCY, FRACTURES): VIT D 25 HYDROXY: 35 ng/mL (ref 30–100)

## 2017-02-12 LAB — VITAMIN B12: Vitamin B-12: 643 pg/mL (ref 200–1100)

## 2017-02-12 LAB — TSH: TSH: 2.22 m[IU]/L (ref 0.40–4.50)

## 2017-02-15 NOTE — Addendum Note (Signed)
Addended by: Inda Coke on: 02/15/2017 08:25 AM   Modules accepted: Orders

## 2017-02-22 DIAGNOSIS — M7061 Trochanteric bursitis, right hip: Secondary | ICD-10-CM | POA: Diagnosis not present

## 2017-02-22 DIAGNOSIS — M545 Low back pain, unspecified: Secondary | ICD-10-CM | POA: Insufficient documentation

## 2017-02-22 DIAGNOSIS — M7062 Trochanteric bursitis, left hip: Secondary | ICD-10-CM | POA: Diagnosis not present

## 2017-03-01 DIAGNOSIS — Z1211 Encounter for screening for malignant neoplasm of colon: Secondary | ICD-10-CM | POA: Diagnosis not present

## 2017-03-01 DIAGNOSIS — Z1212 Encounter for screening for malignant neoplasm of rectum: Secondary | ICD-10-CM | POA: Diagnosis not present

## 2017-03-03 LAB — HM COLONOSCOPY

## 2017-03-09 DIAGNOSIS — M545 Low back pain: Secondary | ICD-10-CM | POA: Diagnosis not present

## 2017-03-09 DIAGNOSIS — M6281 Muscle weakness (generalized): Secondary | ICD-10-CM | POA: Diagnosis not present

## 2017-03-09 DIAGNOSIS — M25559 Pain in unspecified hip: Secondary | ICD-10-CM | POA: Diagnosis not present

## 2017-03-11 ENCOUNTER — Encounter: Payer: Self-pay | Admitting: Family Medicine

## 2017-03-11 LAB — COLOGUARD: COLOGUARD: NEGATIVE

## 2017-03-14 ENCOUNTER — Encounter: Payer: Self-pay | Admitting: Family Medicine

## 2017-03-15 DIAGNOSIS — M545 Low back pain: Secondary | ICD-10-CM | POA: Diagnosis not present

## 2017-03-15 DIAGNOSIS — M25559 Pain in unspecified hip: Secondary | ICD-10-CM | POA: Diagnosis not present

## 2017-03-15 DIAGNOSIS — M6281 Muscle weakness (generalized): Secondary | ICD-10-CM | POA: Diagnosis not present

## 2017-03-16 ENCOUNTER — Other Ambulatory Visit: Payer: Self-pay | Admitting: Family Medicine

## 2017-03-16 DIAGNOSIS — G43009 Migraine without aura, not intractable, without status migrainosus: Secondary | ICD-10-CM

## 2017-03-16 MED ORDER — NARATRIPTAN HCL 2.5 MG PO TABS: 2.5000 mg | ORAL_TABLET | ORAL | 5 refills | Status: DC | PRN

## 2017-03-16 NOTE — Telephone Encounter (Signed)
Copied from [place]. Topic: Quick Communication - Rx Refill/Question >> Mar 16, 2017 10:49 AM Bea Graff, NT wrote: Medication: naratriptan [facility])   Has the patient contacted their pharmacy? Yes.     (Agent: If no, request that the patient contact the pharmacy for the refill.)   Preferred Pharmacy (with phone number or street name): [facility].    Agent: Please be advised that RX refills may take up to 3 business days. We ask that you follow-up with your pharmacy.

## 2017-03-16 NOTE — Telephone Encounter (Signed)
[facility] refill Last OV: 02/10/17 Last Refill:09/15/16 Pharmacy:[facility]

## 2017-03-16 NOTE — Telephone Encounter (Signed)
Thank you request has been tee'd up and sent to the Doctor awaiting approval.

## 2017-03-23 DIAGNOSIS — M545 Low back pain: Secondary | ICD-10-CM | POA: Diagnosis not present

## 2017-03-23 DIAGNOSIS — M6281 Muscle weakness (generalized): Secondary | ICD-10-CM | POA: Diagnosis not present

## 2017-03-23 DIAGNOSIS — M25559 Pain in unspecified hip: Secondary | ICD-10-CM | POA: Diagnosis not present

## 2017-03-29 DIAGNOSIS — R350 Frequency of micturition: Secondary | ICD-10-CM | POA: Diagnosis not present

## 2017-03-29 DIAGNOSIS — N200 Calculus of kidney: Secondary | ICD-10-CM | POA: Diagnosis not present

## 2017-03-29 DIAGNOSIS — R3914 Feeling of incomplete bladder emptying: Secondary | ICD-10-CM | POA: Diagnosis not present

## 2017-03-30 DIAGNOSIS — M25559 Pain in unspecified hip: Secondary | ICD-10-CM | POA: Diagnosis not present

## 2017-03-30 DIAGNOSIS — M545 Low back pain: Secondary | ICD-10-CM | POA: Diagnosis not present

## 2017-03-30 DIAGNOSIS — M6281 Muscle weakness (generalized): Secondary | ICD-10-CM | POA: Diagnosis not present

## 2017-04-06 DIAGNOSIS — M25559 Pain in unspecified hip: Secondary | ICD-10-CM | POA: Diagnosis not present

## 2017-04-06 DIAGNOSIS — M545 Low back pain: Secondary | ICD-10-CM | POA: Diagnosis not present

## 2017-04-06 DIAGNOSIS — M6281 Muscle weakness (generalized): Secondary | ICD-10-CM | POA: Diagnosis not present

## 2017-04-12 DIAGNOSIS — Z1283 Encounter for screening for malignant neoplasm of skin: Secondary | ICD-10-CM | POA: Diagnosis not present

## 2017-04-12 DIAGNOSIS — D485 Neoplasm of uncertain behavior of skin: Secondary | ICD-10-CM | POA: Diagnosis not present

## 2017-04-12 DIAGNOSIS — D229 Melanocytic nevi, unspecified: Secondary | ICD-10-CM | POA: Diagnosis not present

## 2017-04-12 DIAGNOSIS — B078 Other viral warts: Secondary | ICD-10-CM | POA: Diagnosis not present

## 2017-04-22 DIAGNOSIS — M6281 Muscle weakness (generalized): Secondary | ICD-10-CM | POA: Diagnosis not present

## 2017-04-22 DIAGNOSIS — M25559 Pain in unspecified hip: Secondary | ICD-10-CM | POA: Diagnosis not present

## 2017-04-22 DIAGNOSIS — M545 Low back pain: Secondary | ICD-10-CM | POA: Diagnosis not present

## 2017-05-10 DIAGNOSIS — M545 Low back pain: Secondary | ICD-10-CM | POA: Diagnosis not present

## 2017-05-10 DIAGNOSIS — M6281 Muscle weakness (generalized): Secondary | ICD-10-CM | POA: Diagnosis not present

## 2017-05-10 DIAGNOSIS — M25559 Pain in unspecified hip: Secondary | ICD-10-CM | POA: Diagnosis not present

## 2017-05-30 ENCOUNTER — Encounter: Payer: 59 | Admitting: Family Medicine

## 2017-05-30 DIAGNOSIS — M25559 Pain in unspecified hip: Secondary | ICD-10-CM | POA: Diagnosis not present

## 2017-05-30 DIAGNOSIS — M6281 Muscle weakness (generalized): Secondary | ICD-10-CM | POA: Diagnosis not present

## 2017-05-30 DIAGNOSIS — M545 Low back pain: Secondary | ICD-10-CM | POA: Diagnosis not present

## 2017-06-06 DIAGNOSIS — M545 Low back pain: Secondary | ICD-10-CM | POA: Diagnosis not present

## 2017-06-06 DIAGNOSIS — M25559 Pain in unspecified hip: Secondary | ICD-10-CM | POA: Diagnosis not present

## 2017-06-06 DIAGNOSIS — M6281 Muscle weakness (generalized): Secondary | ICD-10-CM | POA: Diagnosis not present

## 2017-06-13 DIAGNOSIS — M6281 Muscle weakness (generalized): Secondary | ICD-10-CM | POA: Diagnosis not present

## 2017-06-13 DIAGNOSIS — M25559 Pain in unspecified hip: Secondary | ICD-10-CM | POA: Diagnosis not present

## 2017-06-13 DIAGNOSIS — M545 Low back pain: Secondary | ICD-10-CM | POA: Diagnosis not present

## 2017-06-20 DIAGNOSIS — M25559 Pain in unspecified hip: Secondary | ICD-10-CM | POA: Diagnosis not present

## 2017-06-20 DIAGNOSIS — M6281 Muscle weakness (generalized): Secondary | ICD-10-CM | POA: Diagnosis not present

## 2017-06-20 DIAGNOSIS — M545 Low back pain: Secondary | ICD-10-CM | POA: Diagnosis not present

## 2017-06-24 DIAGNOSIS — M7062 Trochanteric bursitis, left hip: Secondary | ICD-10-CM | POA: Insufficient documentation

## 2017-07-27 ENCOUNTER — Other Ambulatory Visit: Payer: Self-pay

## 2017-07-27 DIAGNOSIS — D0512 Intraductal carcinoma in situ of left breast: Secondary | ICD-10-CM

## 2017-08-02 DIAGNOSIS — M7062 Trochanteric bursitis, left hip: Secondary | ICD-10-CM | POA: Diagnosis not present

## 2017-08-10 ENCOUNTER — Ambulatory Visit: Payer: 59 | Admitting: Family Medicine

## 2017-08-10 ENCOUNTER — Encounter: Payer: Self-pay | Admitting: Family Medicine

## 2017-08-10 VITALS — BP 114/64 | HR 72 | Temp 98.1°F | Resp 16 | Ht 63.0 in | Wt 207.3 lb

## 2017-08-10 DIAGNOSIS — M797 Fibromyalgia: Secondary | ICD-10-CM | POA: Diagnosis not present

## 2017-08-10 DIAGNOSIS — E538 Deficiency of other specified B group vitamins: Secondary | ICD-10-CM

## 2017-08-10 DIAGNOSIS — F320 Major depressive disorder, single episode, mild: Secondary | ICD-10-CM | POA: Diagnosis not present

## 2017-08-10 DIAGNOSIS — I4892 Unspecified atrial flutter: Secondary | ICD-10-CM | POA: Diagnosis not present

## 2017-08-10 DIAGNOSIS — E119 Type 2 diabetes mellitus without complications: Secondary | ICD-10-CM | POA: Diagnosis not present

## 2017-08-10 DIAGNOSIS — J3089 Other allergic rhinitis: Secondary | ICD-10-CM

## 2017-08-10 DIAGNOSIS — E559 Vitamin D deficiency, unspecified: Secondary | ICD-10-CM

## 2017-08-10 DIAGNOSIS — E785 Hyperlipidemia, unspecified: Secondary | ICD-10-CM

## 2017-08-10 DIAGNOSIS — G43009 Migraine without aura, not intractable, without status migrainosus: Secondary | ICD-10-CM

## 2017-08-10 DIAGNOSIS — E1169 Type 2 diabetes mellitus with other specified complication: Secondary | ICD-10-CM

## 2017-08-10 MED ORDER — DULOXETINE HCL 60 MG PO CPEP: 60.0000 mg | ORAL_CAPSULE | Freq: Every day | ORAL | 1 refills | Status: DC

## 2017-08-10 MED ORDER — TOPIRAMATE 25 MG PO TABS: 25.0000 mg | ORAL_TABLET | Freq: Every day | ORAL | 0 refills | Status: DC

## 2017-08-10 MED ORDER — GABAPENTIN 300 MG PO CAPS: 300.0000 mg | ORAL_CAPSULE | Freq: Every day | ORAL | 0 refills | Status: DC

## 2017-08-10 NOTE — Progress Notes (Signed)
Name: Sonya Lopez   MRN: [id number]    DOB: 23-Apr-1955   Date:08/10/2017       Progress Note  Subjective  Chief Complaint  Chief Complaint  Patient presents with  . Medication Refill  . Depression  . FMS  . Migraine    Worst in the heat but controlled with medication  . Diabetes  . Gastroesophageal Reflux    Well controlled   . History of left breast cancer    HPI  FMS: She states that Cymbalta and Gabapentin seems to be helping with symptoms, she has been trying to move more often and also seems to help . Pain is still daily, aching like  Bilateral hip pain: seen by [facility] and had PT , pain on right hip resolved, left side now has radiculitis afraid of taking Meloxicam, advised to take it for one week and after that prn because of kidney disease.   Atrial Flutter/Afib: Episodes are stable with Amiodarone, but currently only taking Amiodarone prn and tolerating it well.  She is has propranolol prn only when . HR last week went up to 180, had to take 8 pacerones to make it resolve, it lasted for 4-5 hours. She states episode started very fast , got dizzy, she sat right away, no nausea or vomiting, but caused a immediate headache and severe palpitation.   Migraine Headaches: episodes down to a few times a monthusually on left maxillary area and radiates to left forehead and down to left side of neck, Axert kicks in within 40 minutes, sometimes associated with nausea, but no vomiting. Having episodes more often over the past 2 weeks, about 4 per week. Taking Amerge prn and it seems to control symptoms. Discussed Topamax and she is willing to try it.   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 Eye exam is up to date, foot exam up to date. Last GFR was low, we will monitor   GERD: under control with medication, she tries alternating Prilosec and Zantac.  History of left breast cancer: she had lumpectomy, could not tolerate estrogen suppressive therapy. mammogram up to date   Obesity: she has changed her diet and is exercise more has lost 10 lbs since 09/2016 until 02/2017 but gained 4 lbs back since mother's funeral    Patient Active Problem List   Diagnosis Date Noted  . Trochanteric bursitis of left hip 06/24/2017  . Low back pain 02/22/2017  . Benign breast cyst in female, right 05/25/2016  . History of left breast cancer 09/16/2015  . Long-term use of high-risk medication 02/25/2015  . Increased urinary frequency 08/01/2014  . Mild major depression ([place]) 07/29/2014  . Gastric reflux 07/29/2014  . Chronic interstitial cystitis 07/29/2014  . Calculus of kidney 07/29/2014  . Allergic rhinitis 07/29/2014  . Acne erythematosa 07/29/2014  . Lumbosacral radiculitis 07/29/2014  . Tinnitus 07/29/2014  . Mixed incontinence, urge and stress (female) (female) 08/23/2013  . Hyperlipidemia 04/25/2013  . H/O: hysterectomy 11/30/2011  . Atrial flutter ([place]) 05/03/2011  . Diet-controlled type 2 diabetes mellitus ([place]) 04/16/2011  . Bladder cystocele 01/05/2011  . Migraine without aura and without status migrainosus, not  intractable 12/29/2010  . Fibromyalgia 12/29/2010  . Obesity 12/29/2010    Past Surgical History:  Procedure Laterality Date  . ANTERIOR AND POSTERIOR REPAIR  11/30/2011   Procedure: ANTERIOR (CYSTOCELE) AND POSTERIOR REPAIR (RECTOCELE);  Surgeon: Reece Packer, MD;  Location: [facility];  Service: Urology;  Laterality: N/A;  with cysto  . BLADDER SUSPENSION  11/30/2011   Procedure: [facility] PROCEDURE;  Surgeon: Reece Packer, MD;  Location: [facility];  Service: Urology;  Laterality: N/A;  graft  10x6  . BREAST BIOPSY Right 2007   negative core  . BREAST BIOPSY Left 09/16/2015   DCIS  . BREAST LUMPECTOMY Left 09/30/2015   DCIS clear margins and rad tx  . BREAST LUMPECTOMY WITH NEEDLE LOCALIZATION Left 09/30/2015   Procedure: BREAST LUMPECTOMY WITH NEEDLE LOCALIZATION;  Surgeon: Robert Bellow, MD;  Location: [facility];  Service: General;  Laterality: Left;  . CESAREAN SECTION     x 1  . COLONOSCOPY  2009  . CYSTOSCOPY  11/30/2011   Procedure: CYSTOSCOPY;  Surgeon: Reece Packer, MD;  Location: [facility];  Service: Urology;;  . Brigitte Pulse AND CURETTAGE OF UTERUS  05/05/2007  . GALLBLADDER SURGERY  2004  . TONSILLECTOMY  1961  . VAGINAL HYSTERECTOMY  11/30/2011   Procedure: HYSTERECTOMY VAGINAL;  Surgeon: Emily Filbert, MD;  Location: [facility];  Service: Gynecology;  Laterality: N/A;    Family History  Problem Relation Age of Onset  . Diabetes Mother   . Hearing loss Mother   . Hypertension Mother   . Heart disease Father   . Heart disease Paternal Grandfather   . Asthma Daughter   . Asthma Daughter   . Migraines Daughter   . Fibromyalgia Daughter   . Interstitial cystitis Daughter   . Asthma Daughter   . Colitis Daughter   . Breast cancer Neg Hx     Social History   Socioeconomic History  . Marital status: Married    Spouse name: Environmental consultant   . Number of children: 3  . Years of education: Not on file  . Highest education level: Not on file  Occupational History  . Not on file  Social Needs  . Financial resource strain: Not on file  . Food insecurity:    Worry: Not on file    Inability: Not on file  . Transportation needs:    Medical: Not on file    Non-medical: Not on file  Tobacco Use  . Smoking status: Never Smoker  . Smokeless tobacco: Never Used  Substance and Sexual Activity  . Alcohol use: Yes    Alcohol/week: 0.0 oz    Comment: rare  . Drug use: No  . Sexual activity: Yes    Partners: Male  Lifestyle  . Physical activity:    Days per week: 6  days    Minutes per session: 20 min  . Stress: To some extent  Relationships  . Social connections:    Talks on phone: More than three times a week    Gets together: More than three times a week    Attends religious service: More than 4 times per year    Active member of club or organization: Yes    Attends meetings of clubs or organizations: More than 4 times per year    Relationship status: Married  . Intimate partner violence:    Fear of current or ex partner: No    Emotionally abused: No    Physically abused: No  Forced sexual activity: No  Other Topics Concern  . Not on file  Social History Narrative  . Not on file     Current Outpatient Medications:  .  amiodarone (PACERONE) 200 MG tablet, TAKE 1 TABLET BY MOUTH TWICE DAILY AS NEEDED, Disp: 60 tablet, Rfl: 3 .  apixaban (ELIQUIS) 5 MG TABS tablet, Take 1 tablet (5 mg total) by mouth 2 (two) times daily., Disp: 60 tablet, Rfl: 11 .  Clindamycin Phosphate foam, Apply 1 Units topically as needed. For rosacea  , Disp: , Rfl: 5 .  cyclobenzaprine (FLEXERIL) 5 MG tablet, Take 5 mg by mouth 2 (two) times daily as needed for muscle spasms., Disp: , Rfl:  .  DULoxetine (CYMBALTA) 60 MG capsule, Take 1 capsule (60 mg total) by mouth daily., Disp: 90 capsule, Rfl: 1 .  FINACEA 15 % FOAM, , Disp: , Rfl:  .  gabapentin (NEURONTIN) 300 MG capsule, Take 1 capsule (300 mg total) by mouth at bedtime., Disp: 90 capsule, Rfl: 0 .  levocetirizine (XYZAL) 5 MG tablet, Take 1 tablet (5 mg total) by mouth every evening., Disp: 30 tablet, Rfl: 0 .  magnesium oxide (MAG-OX) 400 MG tablet, Take 400 mg by mouth every morning. , Disp: , Rfl:  .  meloxicam (MOBIC) 15 MG tablet, Take 15 mg by mouth daily., Disp: , Rfl:  .  mometasone (NASONEX) 50 MCG/ACT nasal spray, Place 2 sprays into the nose every morning. , Disp: , Rfl:  .  naratriptan (AMERGE) 2.5 MG tablet, Take 1 tablet (2.5 mg total) by mouth as needed. Take one (1) tablet at onset of headache;  if returns or does not resolve, may repeat after 4 hours; do not exceed five (5) mg in 24 hours., Disp: 9 tablet, Rfl: 5 .  omeprazole (PRILOSEC) 20 MG capsule, Take 20 mg by mouth daily., Disp: , Rfl:  .  Probiotic Product (PROBIOTIC PO), Take 1 capsule by mouth daily. , Disp: , Rfl:  .  propranolol (INDERAL) 10 MG tablet, Take 1 tablet by mouth daily. Take 1 tablet (10 mg) by mouth three times a day as needed for fast heart rates , Disp: , Rfl:  .  tamsulosin (FLOMAX) 0.4 MG CAPS capsule, Take 0.8 mg by mouth daily after breakfast. , Disp: , Rfl:  .  Calcium Carbonate-Vitamin D (CALTRATE 600+D) 600-400 MG-UNIT per tablet, Take 1 tablet by mouth daily. , Disp: , Rfl:  .  CAMBIA 50 MG PACK, USE 1 PACKET DAILY FOR MIGRAINES MAY REPEAT IN 15 MIN IF NO RESPONSE, Disp: , Rfl: 3 .  Multiple Vitamin (MULTIVITAMIN) tablet, Take 1 tablet by mouth daily., Disp: , Rfl:  .  Nutritional Supplements (HEPATIC COMPLEX PO), Take by mouth., Disp: , Rfl:   Allergies  Allergen Reactions  . Adhesive [Tape] Dermatitis    SKIN TURNS RED-PAPER TAPE OK TO USE  . Levaquin  [Levofloxacin]   . Macrodantin [Nitrofurantoin] Nausea And Vomiting  . Metronidazole Nausea Only  . Nitrofurantoin Macrocrystal Nausea And Vomiting  . Sulfamethoxazole-Trimethoprim Nausea Only  . Cephalexin Rash  . Clindamycin Hcl Rash  . Doxycycline Swelling and Rash  . Erythromycin Rash     ROS  Constitutional: Negative for fever or weight change.  Respiratory: Negative for cough and shortness of breath.   Cardiovascular: Negative for chest pain or palpitations.  Gastrointestinal: Negative for abdominal pain, no bowel changes.  Musculoskeletal: Negative for gait problem or joint swelling.  Skin: Negative for rash.  Neurological: Negative for dizziness or  headache.  No other specific complaints in a complete review of systems (except as listed in HPI above).  Objective  Vitals:   08/10/17 0914  BP: 114/64  Pulse: 72  Resp: 16   Temp: 98.1 F (36.7 C)  TempSrc: Oral  SpO2: 98%  Weight: 207 lb 4.8 oz (94 kg)  Height: 5\' 3"  (1.6 m)    Body mass index is 36.72 kg/m.  Physical Exam  Constitutional: Patient appears well-developed and well-nourished. Obese  No distress.  HEENT: head atraumatic, normocephalic, pupils equal and reactive to light, neck supple, throat within normal limits Cardiovascular: Normal rate, regular rhythm and normal heart sounds.  No murmur heard. No BLE edema. Pulmonary/Chest: Effort normal and breath sounds normal. No respiratory distress. Abdominal: Soft.  There is no tenderness. Psychiatric: Patient has a normal mood and affect. behavior is normal. Judgment and thought content normal Muscular Skeletal: trigger point positive.   PHQ2/9: Depression screen [facility] 2/9 08/10/2017 11/03/2016 04/28/2016 12/03/2015 11/25/2015  Decreased Interest 0 0 0 0 0  Down, Depressed, Hopeless 1 0 0 0 0  PHQ - 2 Score 1 0 0 0 0  Altered sleeping 1 - - - -  Tired, decreased energy 1 - - - -  Change in appetite 1 - - - -  Feeling bad or failure about yourself  1 - - - -  Trouble concentrating 0 - - - -  Moving slowly or fidgety/restless 0 - - - -  Suicidal thoughts 0 - - - -  PHQ-9 Score 5 - - - -  Difficult doing work/chores Somewhat difficult - - - -     Fall Risk: Fall Risk  08/10/2017 02/10/2017 11/03/2016 04/28/2016 11/25/2015  Falls in the past year? No No No Yes Yes  Number falls in past yr: - - - 1 1  Injury with Fall? - - - No No     Functional Status Survey: Is the patient deaf or have difficulty hearing?: No Does the patient have difficulty seeing, even when wearing glasses/contacts?: Yes(reading glasses) Does the patient have difficulty concentrating, remembering, or making decisions?: No Does the patient have difficulty walking or climbing stairs?: No Does the patient have difficulty dressing or bathing?: No Does the patient have difficulty doing errands alone such as visiting a  doctor's office or shopping?: No   Assessment & Plan  1. Atrial flutter, unspecified type (HCC)  Stable , under the care of Dr. Rockey Situ   2. Mild major depression (HCC)  - DULoxetine (CYMBALTA) 60 MG capsule; Take 1 capsule (60 mg total) by mouth daily.  Dispense: 90 capsule; Refill: 1  3. Fibromyalgia  - gabapentin (NEURONTIN) 300 MG capsule; Take 1 capsule (300 mg total) by mouth at bedtime.  Dispense: 90 capsule; Refill: 0  4. Diet-controlled type 2 diabetes mellitus ([place])  HgbA1C next visit  5. Perennial allergic rhinitis   6. Vitamin D deficiency  Continue supplementation   7. B12 deficiency  Last level was at goal   8. Dyslipidemia associated with type 2 diabetes mellitus ([place])  Not on statin therapy, on diet only   9. Migraine without aura and without status migrainosus, not intractable  - topiramate (TOPAMAX) 25 MG tablet; Take 1 tablet (25 mg total) by mouth at bedtime. Go up by one pill every 3 days to a max of 4 per night  Dispense: 60 tablet; Refill: 0

## 2017-08-24 DIAGNOSIS — E119 Type 2 diabetes mellitus without complications: Secondary | ICD-10-CM | POA: Diagnosis not present

## 2017-08-24 DIAGNOSIS — E785 Hyperlipidemia, unspecified: Secondary | ICD-10-CM | POA: Diagnosis not present

## 2017-08-24 DIAGNOSIS — E1169 Type 2 diabetes mellitus with other specified complication: Secondary | ICD-10-CM | POA: Diagnosis not present

## 2017-08-25 LAB — COMPLETE METABOLIC PANEL WITH GFR
AG Ratio: 1.9 (calc) (ref 1.0–2.5)
ALBUMIN MSPROF: 4.1 g/dL (ref 3.6–5.1)
ALKALINE PHOSPHATASE (APISO): 139 U/L — AB (ref 33–130)
ALT: 14 U/L (ref 6–29)
AST: 13 U/L (ref 10–35)
BILIRUBIN TOTAL: 0.4 mg/dL (ref 0.2–1.2)
BUN: 21 mg/dL (ref 7–25)
CO2: 31 mmol/L (ref 20–32)
CREATININE: 0.87 mg/dL (ref 0.50–0.99)
Calcium: 9.2 mg/dL (ref 8.6–10.4)
Chloride: 103 mmol/L (ref 98–110)
GFR, EST NON AFRICAN AMERICAN: 70 mL/min/{1.73_m2} (ref >=60)
GFR, Est African American: 82 mL/min/{1.73_m2} (ref >=60)
GLOBULIN: 2.2 g/dL (ref 1.9–3.7)
Glucose, Bld: 80 mg/dL (ref 65–99)
Potassium: 4.4 mmol/L (ref 3.5–5.3)
SODIUM: 139 mmol/L (ref 135–146)
Total Protein: 6.3 g/dL (ref 6.1–8.1)

## 2017-08-25 LAB — HEMOGLOBIN A1C
EAG (MMOL/L): 6.5 (calc)
Hgb A1c MFr Bld: 5.7 % of total Hgb — ABNORMAL HIGH (ref <5.7)
Mean Plasma Glucose: 117 (calc)

## 2017-08-25 LAB — LIPID PANEL
CHOL/HDL RATIO: 3.1 (calc) (ref <5.0)
CHOLESTEROL: 208 mg/dL — AB (ref <200)
HDL: 68 mg/dL (ref >50)
LDL CHOLESTEROL (CALC): 124 mg/dL — AB
Non-HDL Cholesterol (Calc): 140 mg/dL (calc) — ABNORMAL HIGH (ref <130)
TRIGLYCERIDES: 70 mg/dL (ref <150)

## 2017-08-25 LAB — CBC WITH DIFFERENTIAL/PLATELET
BASOS ABS: 42 {cells}/uL (ref 0–200)
BASOS PCT: 0.6 %
EOS ABS: 77 {cells}/uL (ref 15–500)
Eosinophils Relative: 1.1 %
HCT: 40.8 % (ref 35.0–45.0)
Hemoglobin: 13.7 g/dL (ref 11.7–15.5)
Lymphs Abs: 1449 cells/uL (ref 850–3900)
MCH: 31.1 pg (ref 27.0–33.0)
MCHC: 33.6 g/dL (ref 32.0–36.0)
MCV: 92.7 fL (ref 80.0–100.0)
MPV: 9.8 fL (ref 7.5–12.5)
Monocytes Relative: 6.1 %
NEUTROS PCT: 71.5 %
Neutro Abs: 5005 cells/uL (ref 1500–7800)
PLATELETS: 300 10*3/uL (ref 140–400)
RBC: 4.4 10*6/uL (ref 3.80–5.10)
RDW: 13.1 % (ref 11.0–15.0)
TOTAL LYMPHOCYTE: 20.7 %
WBC: 7 10*3/uL (ref 3.8–10.8)
WBCMIX: 427 {cells}/uL (ref 200–950)

## 2017-08-26 DIAGNOSIS — Z719 Counseling, unspecified: Secondary | ICD-10-CM | POA: Diagnosis not present

## 2017-08-30 ENCOUNTER — Ambulatory Visit (INDEPENDENT_AMBULATORY_CARE_PROVIDER_SITE_OTHER): Payer: 59 | Admitting: Family Medicine

## 2017-08-30 ENCOUNTER — Encounter: Payer: Self-pay | Admitting: Family Medicine

## 2017-08-30 VITALS — BP 104/60 | HR 101 | Temp 98.0°F | Resp 18 | Ht 63.0 in | Wt 209.2 lb

## 2017-08-30 DIAGNOSIS — N3946 Mixed incontinence: Secondary | ICD-10-CM

## 2017-08-30 DIAGNOSIS — Z01419 Encounter for gynecological examination (general) (routine) without abnormal findings: Secondary | ICD-10-CM

## 2017-08-30 NOTE — Patient Instructions (Signed)
Preventive Care 40-64 Years, Female Preventive care refers to lifestyle choices and visits with your health care provider that can promote health and wellness. What does preventive care include?  A yearly physical exam. This is also called an annual well check.  Dental exams once or twice a year.  Routine eye exams. Ask your health care provider how often you should have your eyes checked.  Personal lifestyle choices, including: ? Daily care of your teeth and gums. ? Regular physical activity. ? Eating a healthy diet. ? Avoiding tobacco and drug use. ? Limiting alcohol use. ? Practicing safe sex. ? Taking low-dose aspirin daily starting at age 58. ? Taking vitamin and mineral supplements as recommended by your health care provider. What happens during an annual well check? The services and screenings done by your health care provider during your annual well check will depend on your age, overall health, lifestyle risk factors, and family history of disease. Counseling Your health care provider may ask you questions about your:  Alcohol use.  Tobacco use.  Drug use.  Emotional well-being.  Home and relationship well-being.  Sexual activity.  Eating habits.  Work and work Statistician.  Method of birth control.  Menstrual cycle.  Pregnancy history.  Screening You may have the following tests or measurements:  Height, weight, and BMI.  Blood pressure.  Lipid and cholesterol levels. These may be checked every 5 years, or more frequently if you are over 81 years old.  Skin check.  Lung cancer screening. You may have this screening every year starting at age 78 if you have a 30-pack-year history of smoking and currently smoke or have quit within the past 15 years.  Fecal occult blood test (FOBT) of the stool. You may have this test every year starting at age 65.  Flexible sigmoidoscopy or colonoscopy. You may have a sigmoidoscopy every 5 years or a colonoscopy  every 10 years starting at age 30.  Hepatitis C blood test.  Hepatitis B blood test.  Sexually transmitted disease (STD) testing.  Diabetes screening. This is done by checking your blood sugar (glucose) after you have not eaten for a while (fasting). You may have this done every 1-3 years.  Mammogram. This may be done every 1-2 years. Talk to your health care provider about when you should start having regular mammograms. This may depend on whether you have a family history of breast cancer.  BRCA-related cancer screening. This may be done if you have a family history of breast, ovarian, tubal, or peritoneal cancers.  Pelvic exam and Pap test. This may be done every 3 years starting at age 80. Starting at age 36, this may be done every 5 years if you have a Pap test in combination with an HPV test.  Bone density scan. This is done to screen for osteoporosis. You may have this scan if you are at high risk for osteoporosis.  Discuss your test results, treatment options, and if necessary, the need for more tests with your health care provider. Vaccines Your health care provider may recommend certain vaccines, such as:  Influenza vaccine. This is recommended every year.  Tetanus, diphtheria, and acellular pertussis (Tdap, Td) vaccine. You may need a Td booster every 10 years.  Varicella vaccine. You may need this if you have not been vaccinated.  Zoster vaccine. You may need this after age 5.  Measles, mumps, and rubella (MMR) vaccine. You may need at least one dose of MMR if you were born in  1959 or later. You may also need a second dose.  Pneumococcal 13-valent conjugate (PCV13) vaccine. You may need this if you have certain conditions and were not previously vaccinated.  Pneumococcal polysaccharide (PPSV23) vaccine. You may need one or two doses if you smoke cigarettes or if you have certain conditions.  Meningococcal vaccine. You may need this if you have certain  conditions.  Hepatitis A vaccine. You may need this if you have certain conditions or if you travel or work in places where you may be exposed to hepatitis A.  Hepatitis B vaccine. You may need this if you have certain conditions or if you travel or work in places where you may be exposed to hepatitis B.  Haemophilus influenzae type b (Hib) vaccine. You may need this if you have certain conditions.  Talk to your health care provider about which screenings and vaccines you need and how often you need them. This information is not intended to replace advice given to you by your health care provider. Make sure you discuss any questions you have with your health care provider. Document Released: 02/14/2015 Document Revised: 10/08/2015 Document Reviewed: 11/19/2014 Elsevier Interactive Patient Education  2018 [employer]  

## 2017-08-30 NOTE — Progress Notes (Signed)
Name: Sonya Lopez   MRN: [id number]    DOB: 07-05-1955   Date:08/30/2017       Progress Note  Subjective  Chief Complaint  Chief Complaint  Patient presents with  . Annual Exam    HPI   Patient presents for annual CPE   Diet: she just enrolled on a health program through her insurance and is has changed her diet, we discussed lab results, increase of hgbA1C and also lipid panel showed increased LDL and she will work on life style modification  Exercise: walking almost daily   USPSTF grade A and B recommendations    Office Visit from 08/10/2017 in [facility]  AUDIT-C Score  1     Depression:  Depression screen [facility] 2/9 08/30/2017 08/10/2017 11/03/2016 04/28/2016 12/03/2015  Decreased Interest 0 0 0 0 0  Down, Depressed, Hopeless 1 1 0 0 0  PHQ - 2 Score 1 1 0 0 0  Altered sleeping 0 1 - - -  Tired, decreased energy 1 1 - - -  Change in appetite 1 1 - - -  Feeling bad or failure about yourself  0 1 - - -  Trouble concentrating 0 0 - - -  Moving slowly or fidgety/restless 0 0 - - -  Suicidal thoughts 0 0 - - -  PHQ-9 Score 3 5 - - -  Difficult doing work/chores Not difficult at all Somewhat difficult - - -   Hypertension: BP Readings from Last 3 Encounters:  08/30/17 104/60  08/10/17 114/64  02/10/17 110/60   Obesity: Wt Readings from Last 3 Encounters:  08/30/17 209 lb 3.2 oz (94.9 kg)  08/10/17 207 lb 4.8 oz (94 kg)  02/10/17 202 lb 9.6 oz (91.9 kg)   BMI Readings from Last 3 Encounters:  08/30/17 37.06 kg/m  08/10/17 36.72 kg/m  02/10/17 35.89 kg/m    Hep C Screening: N/A STD testing and prevention (HIV/chl/gon/syphilis): N/A Intimate partner violence: negative screen  Sexual History/Pain during Intercourse: Menstrual History/LMP/Abnormal Bleeding: s/p hysterectomy  Incontinence Symptoms: stable   Advanced Care Planning: A voluntary discussion about advance care planning including the explanation and discussion of advance  directives.  Discussed health care proxy and Living will, and the patient was able to identify a health care proxy as husband .  Patient does have a living will at present time.  Breast cancer:  HM Mammogram  Date Value Ref Range Status  02/27/2013 Normal  Final    BRCA gene screening: she had breast cancer and did not have genetic testing  Cervical cancer screening: N/A  Osteoporosis Screening:  HM Dexa Scan  Date Value Ref Range Status  10/03/2006 1.7/-0.2  Final    Lipids:  Lab Results  Component Value Date   CHOL 208 (H) 08/24/2017   CHOL 168 02/11/2017   CHOL 177 01/05/2016   Lab Results  Component Value Date   HDL 68 08/24/2017   HDL 66 02/11/2017   HDL 57 01/05/2016   Lab Results  Component Value Date   LDLCALC 124 (H) 08/24/2017   LDLCALC 88 02/11/2017   LDLCALC 105 (H) 01/05/2016   Lab Results  Component Value Date   TRIG 70 08/24/2017   TRIG 55 02/11/2017   TRIG 76 01/05/2016   Lab Results  Component Value Date   CHOLHDL 3.1 08/24/2017   CHOLHDL 2.5 02/11/2017   CHOLHDL 3.1 01/05/2016   No results found for: LDLDIRECT  Glucose:  Glucose  Date Value Ref Range Status  04/17/2011 90 65 - 99 mg/dL Final  04/16/2011 127 (H) 65 - 99 mg/dL Final   Glucose, Bld  Date Value Ref Range Status  08/24/2017 80 65 - 99 mg/dL Final    Comment:    .            Fasting reference interval .   02/11/2017 88 65 - 99 mg/dL Final    Comment:    .            Fasting reference interval .   01/05/2016 90 65 - 99 mg/dL Final    Skin cancer: discussed atypical lesions - she sees dermatologist yearly  Colorectal cancer: up to date 2019  Lung cancer:   Low Dose CT Chest recommended if Age 60-80 years, 30 pack-year currently smoking OR have quit w/in 15years. Patient does not qualify.   ECG:09/2016  Patient Active Problem List   Diagnosis Date Noted  . Trochanteric bursitis of left hip 06/24/2017  . Low back pain 02/22/2017  . Benign breast cyst in female,  right 05/25/2016  . History of left breast cancer 09/16/2015  . Long-term use of high-risk medication 02/25/2015  . Increased urinary frequency 08/01/2014  . Mild major depression ([place]) 07/29/2014  . Gastric reflux 07/29/2014  . Chronic interstitial cystitis 07/29/2014  . Calculus of kidney 07/29/2014  . Allergic rhinitis 07/29/2014  . Acne erythematosa 07/29/2014  . Lumbosacral radiculitis 07/29/2014  . Tinnitus 07/29/2014  . Mixed incontinence, urge and stress (female) (female) 08/23/2013  . Hyperlipidemia 04/25/2013  . H/O: hysterectomy 11/30/2011  . Atrial flutter ([place]) 05/03/2011  . Diet-controlled type 2 diabetes mellitus ([place]) 04/16/2011  . Bladder cystocele 01/05/2011  . Migraine without aura and without status migrainosus, not intractable 12/29/2010  . Fibromyalgia 12/29/2010  . Obesity 12/29/2010    Past Surgical History:  Procedure Laterality Date  . ANTERIOR AND POSTERIOR REPAIR  11/30/2011   Procedure: ANTERIOR (CYSTOCELE) AND POSTERIOR REPAIR (RECTOCELE);  Surgeon: Reece Packer, MD;  Location: [facility];  Service: Urology;  Laterality: N/A;  with cysto  . BLADDER SUSPENSION  11/30/2011   Procedure: [facility] PROCEDURE;  Surgeon: Reece Packer, MD;  Location: [facility];  Service: Urology;  Laterality: N/A;  graft 10x6  . BREAST BIOPSY Right 2007   negative core  . BREAST BIOPSY Left 09/16/2015   DCIS  . BREAST LUMPECTOMY Left 09/30/2015   DCIS clear margins and rad tx  . BREAST LUMPECTOMY WITH NEEDLE LOCALIZATION Left 09/30/2015   Procedure: BREAST LUMPECTOMY WITH NEEDLE LOCALIZATION;  Surgeon: Robert Bellow, MD;  Location: [facility];  Service: General;  Laterality: Left;  . CESAREAN SECTION     x 1  . COLONOSCOPY  2009  . CYSTOSCOPY  11/30/2011   Procedure: CYSTOSCOPY;  Surgeon: Reece Packer, MD;  Location: [facility];  Service: Urology;;  . Brigitte Pulse AND CURETTAGE OF UTERUS  05/05/2007  . GALLBLADDER SURGERY  2004  . TONSILLECTOMY  1961  . VAGINAL  HYSTERECTOMY  11/30/2011   Procedure: HYSTERECTOMY VAGINAL;  Surgeon: Emily Filbert, MD;  Location: [facility];  Service: Gynecology;  Laterality: N/A;    Family History  Problem Relation Age of Onset  . Diabetes Mother   . Hearing loss Mother   . Hypertension Mother   . Heart disease Father   . Heart disease Paternal Grandfather   . Asthma Daughter   . Asthma Daughter   . Migraines Daughter   . Fibromyalgia Daughter   . Interstitial cystitis Daughter   .  Asthma Daughter   . Colitis Daughter   . Diabetes Brother   . Hypertension Brother   . Breast cancer Neg Hx     Social History   Socioeconomic History  . Marital status: Married    Spouse name: Environmental consultant   . Number of children: 3  . Years of education: 16  . Highest education level: Bachelor's degree (e.g., BA, AB, BS)  Occupational History  . Not on file  Social Needs  . Financial resource strain: Not hard at all  . Food insecurity:    Worry: Never true    Inability: Never true  . Transportation needs:    Medical: No    Non-medical: No  Tobacco Use  . Smoking status: Never Smoker  . Smokeless tobacco: Never Used  Substance and Sexual Activity  . Alcohol use: Not Currently    Alcohol/week: 0.0 oz    Frequency: Never    Comment: Due to AF  . Drug use: No  . Sexual activity: Yes    Partners: Male  Lifestyle  . Physical activity:    Days per week: 6 days    Minutes per session: 20 min  . Stress: To some extent  Relationships  . Social connections:    Talks on phone: More than three times a week    Gets together: More than three times a week    Attends religious service: More than 4 times per year    Active member of club or organization: Yes    Attends meetings of clubs or organizations: More than 4 times per year    Relationship status: Married  . Intimate partner violence:    Fear of current or ex partner: No    Emotionally abused: No    Physically abused: No    Forced sexual activity: No  Other Topics  Concern  . Not on file  Social History Narrative  . Not on file     Current Outpatient Medications:  .  amiodarone (PACERONE) 200 MG tablet, TAKE 1 TABLET BY MOUTH TWICE DAILY AS NEEDED, Disp: 60 tablet, Rfl: 3 .  apixaban (ELIQUIS) 5 MG TABS tablet, Take 1 tablet (5 mg total) by mouth 2 (two) times daily., Disp: 60 tablet, Rfl: 11 .  Calcium Carbonate-Vitamin D (CALTRATE 600+D) 600-400 MG-UNIT per tablet, Take 1 tablet by mouth daily. , Disp: , Rfl:  .  CAMBIA 50 MG PACK, USE 1 PACKET DAILY FOR MIGRAINES MAY REPEAT IN 15 MIN IF NO RESPONSE, Disp: , Rfl: 3 .  Clindamycin Phosphate foam, Apply 1 Units topically as needed. For rosacea  , Disp: , Rfl: 5 .  cyclobenzaprine (FLEXERIL) 5 MG tablet, Take 5 mg by mouth 2 (two) times daily as needed for muscle spasms., Disp: , Rfl:  .  DULoxetine (CYMBALTA) 60 MG capsule, Take 1 capsule (60 mg total) by mouth daily., Disp: 90 capsule, Rfl: 1 .  FINACEA 15 % FOAM, , Disp: , Rfl:  .  gabapentin (NEURONTIN) 300 MG capsule, Take 1 capsule (300 mg total) by mouth at bedtime., Disp: 90 capsule, Rfl: 0 .  levocetirizine (XYZAL) 5 MG tablet, Take 1 tablet (5 mg total) by mouth every evening., Disp: 30 tablet, Rfl: 0 .  magnesium oxide (MAG-OX) 400 MG tablet, Take 400 mg by mouth every morning. , Disp: , Rfl:  .  meloxicam (MOBIC) 15 MG tablet, Take 15 mg by mouth daily., Disp: , Rfl:  .  mometasone (NASONEX) 50 MCG/ACT nasal spray, Place 2 sprays into  the nose every morning. , Disp: , Rfl:  .  Multiple Vitamin (MULTIVITAMIN) tablet, Take 1 tablet by mouth daily., Disp: , Rfl:  .  naratriptan (AMERGE) 2.5 MG tablet, Take 1 tablet (2.5 mg total) by mouth as needed. Take one (1) tablet at onset of headache; if returns or does not resolve, may repeat after 4 hours; do not exceed five (5) mg in 24 hours., Disp: 9 tablet, Rfl: 5 .  Nutritional Supplements (HEPATIC COMPLEX PO), Take by mouth., Disp: , Rfl:  .  omeprazole (PRILOSEC) 20 MG capsule, Take 20 mg by mouth  daily., Disp: , Rfl:  .  Probiotic Product (PROBIOTIC PO), Take 1 capsule by mouth daily. , Disp: , Rfl:  .  propranolol (INDERAL) 10 MG tablet, Take 1 tablet by mouth daily. Take 1 tablet (10 mg) by mouth three times a day as needed for fast heart rates , Disp: , Rfl:  .  tamsulosin (FLOMAX) 0.4 MG CAPS capsule, Take 0.8 mg by mouth daily after breakfast. , Disp: , Rfl:  .  topiramate (TOPAMAX) 25 MG tablet, Take 1 tablet (25 mg total) by mouth at bedtime. Go up by one pill every 3 days to a max of 4 per night, Disp: 60 tablet, Rfl: 0  Allergies  Allergen Reactions  . Adhesive [Tape] Dermatitis    SKIN TURNS RED-PAPER TAPE OK TO USE  . Levaquin  [Levofloxacin]   . Macrodantin [Nitrofurantoin] Nausea And Vomiting  . Metronidazole Nausea Only  . Nitrofurantoin Macrocrystal Nausea And Vomiting  . Sulfamethoxazole-Trimethoprim Nausea Only  . Cephalexin Rash  . Clindamycin Hcl Rash  . Doxycycline Swelling and Rash  . Erythromycin Rash     ROS  Constitutional: Negative for fever or weight change.  Respiratory: Negative for cough and shortness of breath.   Cardiovascular: Negative for chest pain or palpitations.  Gastrointestinal: Negative for abdominal pain, no bowel changes.  Musculoskeletal: Negative for gait problem or joint swelling.  Skin: Negative for rash.  Neurological: Negative for dizziness or headache.  No other specific complaints in a complete review of systems (except as listed in HPI above).  Objective  Vitals:   08/30/17 1347  BP: 104/60  Pulse: (!) 101  Resp: 18  Temp: 98 F (36.7 C)  TempSrc: Oral  SpO2: 96%  Weight: 209 lb 3.2 oz (94.9 kg)  Height: 5' 3" (1.6 m)    Body mass index is 37.06 kg/m.  Physical Exam  Constitutional: Patient appears well-developed and obese  No distress.  HENT: Head: Normocephalic and atraumatic. Ears: B TMs ok, no erythema or effusion; Nose: Nose normal. Mouth/Throat: Oropharynx is clear and moist. No oropharyngeal  exudate.  Eyes: Conjunctivae and EOM are normal. Pupils are equal, round, and reactive to light. No scleral icterus.  Neck: Normal range of motion. Neck supple. No JVD present. No thyromegaly present.  Cardiovascular: Normal rate, regular rhythm and normal heart sounds.  No murmur heard. No BLE edema. Pulmonary/Chest: Effort normal and breath sounds normal. No respiratory distress. Abdominal: Soft. Bowel sounds are normal, no distension. There is no tenderness. no masses Breast: no lumps or masses, no nipple discharge or rashes FEMALE GENITALIA:  External genitalia normal External urethra normal Pelvic not done RECTAL: not done Musculoskeletal: Normal range of motion, no joint effusions. No gross deformities Neurological: he is alert and oriented to person, place, and time. No cranial nerve deficit. Coordination, balance, strength, speech and gait are normal.  Skin: Skin is warm and dry. No rash noted. No  erythema.  Psychiatric: Patient has a normal mood and affect. behavior is normal. Judgment and thought content normal.  Recent Results (from the past 2160 hour(s))  Hemoglobin A1c     Status: Abnormal   Collection Time: 08/24/17  8:25 AM  Result Value Ref Range   Hgb A1c MFr Bld 5.7 (H) <5.7 % of total Hgb    Comment: For someone without known diabetes, a hemoglobin  A1c value between 5.7% and 6.4% is consistent with prediabetes and should be confirmed with a  follow-up test. . For someone with known diabetes, a value <7% indicates that their diabetes is well controlled. A1c targets should be individualized based on duration of diabetes, age, comorbid conditions, and other considerations. . This assay result is consistent with an increased risk of diabetes. . Currently, no consensus exists regarding use of hemoglobin A1c for diagnosis of diabetes for children. .    Mean Plasma Glucose 117 (calc)   eAG (mmol/L) 6.5 (calc)  Lipid panel     Status: Abnormal   Collection Time:  08/24/17  8:25 AM  Result Value Ref Range   Cholesterol 208 (H) <200 mg/dL   HDL 68 >50 mg/dL   Triglycerides 70 <150 mg/dL   LDL Cholesterol (Calc) 124 (H) mg/dL (calc)    Comment: Reference range: <100 . Desirable range <100 mg/dL for primary prevention;   <70 mg/dL for patients with CHD or diabetic patients  with > or = 2 CHD risk factors. Marland Kitchen LDL-C is now calculated using the Martin-Hopkins  calculation, which is a validated novel method providing  better accuracy than the Friedewald equation in the  estimation of LDL-C.  Cresenciano Genre et al. Annamaria Helling. 2947;654(65): 2061-2068  (http://education.[employer].com/faq/FAQ164)    Total CHOL/HDL Ratio 3.1 <5.0 (calc)   Non-HDL Cholesterol (Calc) 140 (H) <130 mg/dL (calc)    Comment: For patients with diabetes plus 1 major ASCVD risk  factor, treating to a non-HDL-C goal of <100 mg/dL  (LDL-C of <70 mg/dL) is considered a therapeutic  option.   COMPLETE METABOLIC PANEL WITH GFR     Status: Abnormal   Collection Time: 08/24/17  8:25 AM  Result Value Ref Range   Glucose, Bld 80 65 - 99 mg/dL    Comment: .            Fasting reference interval .    BUN 21 7 - 25 mg/dL   Creat 0.87 0.50 - 0.99 mg/dL    Comment: For patients >63 years of age, the reference limit for Creatinine is approximately 13% higher for people identified as African-American. .    GFR, Est Non African American 70 > OR = 60 mL/min/1.56m   GFR, Est African American 82 > OR = 60 mL/min/1.729m  BUN/Creatinine Ratio NOT APPLICABLE 6 - 22 (calc)   Sodium 139 135 - 146 mmol/L   Potassium 4.4 3.5 - 5.3 mmol/L   Chloride 103 98 - 110 mmol/L   CO2 31 20 - 32 mmol/L   Calcium 9.2 8.6 - 10.4 mg/dL   Total Protein 6.3 6.1 - 8.1 g/dL   Albumin 4.1 3.6 - 5.1 g/dL   Globulin 2.2 1.9 - 3.7 g/dL (calc)   AG Ratio 1.9 1.0 - 2.5 (calc)   Total Bilirubin 0.4 0.2 - 1.2 mg/dL   Alkaline phosphatase (APISO) 139 (H) 33 - 130 U/L   AST 13 10 - 35 U/L   ALT 14 6 - 29 U/L  CBC  with Differential/Platelet     Status:  None   Collection Time: 08/24/17  8:25 AM  Result Value Ref Range   WBC 7.0 3.8 - 10.8 Thousand/uL   RBC 4.40 3.80 - 5.10 Million/uL   Hemoglobin 13.7 11.7 - 15.5 g/dL   HCT 40.8 35.0 - 45.0 %   MCV 92.7 80.0 - 100.0 fL   MCH 31.1 27.0 - 33.0 pg   MCHC 33.6 32.0 - 36.0 g/dL   RDW 13.1 11.0 - 15.0 %   Platelets 300 140 - 400 Thousand/uL   MPV 9.8 7.5 - 12.5 fL   Neutro Abs 5,005 1,500 - 7,800 cells/uL   Lymphs Abs 1,449 850 - 3,900 cells/uL   WBC mixed population 427 200 - 950 cells/uL   Eosinophils Absolute 77 15 - 500 cells/uL   Basophils Absolute 42 0 - 200 cells/uL   Neutrophils Relative % 71.5 %   Total Lymphocyte 20.7 %   Monocytes Relative 6.1 %   Eosinophils Relative 1.1 %   Basophils Relative 0.6 %      PHQ2/9: Depression screen [facility] 2/9 08/30/2017 08/10/2017 11/03/2016 04/28/2016 12/03/2015  Decreased Interest 0 0 0 0 0  Down, Depressed, Hopeless 1 1 0 0 0  PHQ - 2 Score 1 1 0 0 0  Altered sleeping 0 1 - - -  Tired, decreased energy 1 1 - - -  Change in appetite 1 1 - - -  Feeling bad or failure about yourself  0 1 - - -  Trouble concentrating 0 0 - - -  Moving slowly or fidgety/restless 0 0 - - -  Suicidal thoughts 0 0 - - -  PHQ-9 Score 3 5 - - -  Difficult doing work/chores Not difficult at all Somewhat difficult - - -     Fall Risk: Fall Risk  08/30/2017 08/10/2017 02/10/2017 11/03/2016 04/28/2016  Falls in the past year? No No No No Yes  Number falls in past yr: - - - - 1  Injury with Fall? - - - - No    Functional Status Survey: Is the patient deaf or have difficulty hearing?: No Does the patient have difficulty seeing, even when wearing glasses/contacts?: Yes(glasses) Does the patient have difficulty concentrating, remembering, or making decisions?: No Does the patient have difficulty walking or climbing stairs?: No Does the patient have difficulty dressing or bathing?: No Does the patient have difficulty doing  errands alone such as visiting a doctor's office or shopping?: No   Assessment & Plan  1. Well woman exam  -USPSTF grade A and B recommendations reviewed with patient; age-appropriate recommendations, preventive care, screening tests, etc discussed and encouraged; healthy living encouraged; see AVS for patient education given to patient -Discussed importance of 150 minutes of physical activity weekly, eat two servings of fish weekly, eat one serving of tree nuts ( cashews, pistachios, pecans, almonds.Marland Kitchen) every other day, eat 6 servings of fruit/vegetables daily and drink plenty of water and avoid sweet beverages.    2. Mixed incontinence, urge and stress (female) (female)  - Ambulatory referral to Urology

## 2017-09-11 NOTE — Progress Notes (Signed)
Cardiology Office Note  Date:  09/13/2017   ID:  GARRETT BOWRING, DOB 1956-01-29, MRN [id number]  PCP:  Steele Sizer, MD   Chief Complaint  Patient presents with  . OTHER    12 month f/u no complaints today. Meds reviewed verbally with pt.    HPI:  Sonya Lopez is a very pleasant 62 year old woman with a history of  diabetes,  GERD,  hospital admission in 04/16/2011 for atrial flutter with RVR.  CT coronary calcium score zero 09/2015 She presents for routine followup of her atrial fibrillation and flutter  Unable to tolerate amiodarone in the past, she had tremor  takes propranolol as needed for breakthrough atrial fibrillation/flutter She monitors heart rate using  [facility] Reports having continued episodes of paroxysmal H for ablation flutter lasting sometimes several hours at a time  07/27/2017, atrial fib, lasted hours. Brings in documentation today showing atrial fibrillation 08/10/17 arrhythmia per patinet, 2-3 hours 08/14/2017 08/17/2017  On several episodes had lightheadedness and leg swelling   lab work reviewed with her Total chole 177, LDL 105 Hemoglobin A1c in the 5 range  Father had significant history of coronary artery disease but was a heavy smoker  EKG personally reviewed by myself on todays visit Shows normal sinus rhythm rate 58 bpm no significant ST or T-wave changes  Other past medical history reviewed She details numerous episodes of atrial flutter at the end of 2016 Episode September 6, September 9, September 19, September 26, September 30, October 26, 12/08/2014, November 18, November 25, December 22 Typically episodes will last 1.5 up to 2 hours  Heart rate typically runs 110, sometimes 155 bpm, one episode 170 bpm Relatively symptomatic when she has episodes  She does report significant stress in the family, particularly over the summer 2016 She was started on Cymbalta with improvement of her symptoms. Feels less stressed  EKG on  today's visit shows sinus rhythm with rate 62 bpm, no significant ST or T-wave changes  Previous episodes of atrial flutter in November 2015, January 2016 typically lasting less than 2 hours In the past she did not want to take a medication every day  She takes care of her mother who has significant medical issues. Significant stress at home. She does report having some chest pain in February and early March 2015 which seemed to resolve by starting a proton pump inhibitor. No further chest discomfort with biking or other exercise.  Previous  Echocardiogram is essentially normal with normal systolic function, ejection fraction greater than 55%, no significant valvular disease for wall motion abnormality. Total cholesterol 196, LDL 121, HDL 59. 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  PSH:    Past Surgical History:  Procedure Laterality Date  . ANTERIOR AND POSTERIOR REPAIR  11/30/2011   Procedure: ANTERIOR (CYSTOCELE) AND POSTERIOR REPAIR (RECTOCELE);  Surgeon: Reece Packer, MD;  Location: [facility];  Service: Urology;  Laterality: N/A;  with cysto  . BLADDER SUSPENSION  11/30/2011   Procedure: [facility] PROCEDURE;  Surgeon: Reece Packer, MD;  Location: [facility];  Service: Urology;  Laterality: N/A;  graft 10x6  . BREAST BIOPSY Right 2007  negative core  . BREAST BIOPSY Left 09/16/2015   DCIS  . BREAST LUMPECTOMY Left 09/30/2015   DCIS clear margins and rad tx  . BREAST LUMPECTOMY WITH NEEDLE LOCALIZATION Left 09/30/2015   Procedure: BREAST  LUMPECTOMY WITH NEEDLE LOCALIZATION;  Surgeon: Robert Bellow, MD;  Location: [facility];  Service: General;  Laterality: Left;  . CESAREAN SECTION     x 1  . COLONOSCOPY  2009  . CYSTOSCOPY  11/30/2011   Procedure: CYSTOSCOPY;  Surgeon: Reece Packer, MD;  Location: [facility];  Service: Urology;;  . Brigitte Pulse AND CURETTAGE OF UTERUS  05/05/2007  . GALLBLADDER SURGERY  2004  . TONSILLECTOMY  1961  . VAGINAL HYSTERECTOMY  11/30/2011   Procedure: HYSTERECTOMY VAGINAL;  Surgeon: Emily Filbert, MD;  Location: [facility];  Service: Gynecology;  Laterality: N/A;    Current Outpatient Medications  Medication Sig Dispense Refill  . amiodarone (PACERONE) 200 MG tablet TAKE 1 TABLET BY MOUTH TWICE DAILY AS NEEDED 60 tablet 3  . apixaban (ELIQUIS) 5 MG TABS tablet Take 1 tablet (5 mg total) by mouth 2 (two) times daily. 60 tablet 11  . Clindamycin Phosphate foam Apply 1 Units topically as needed. For rosacea    5  . cyclobenzaprine (FLEXERIL) 5 MG tablet Take 5 mg by mouth 2 (two) times daily as needed for muscle spasms.    . DULoxetine (CYMBALTA) 60 MG capsule Take 1 capsule (60 mg total) by mouth daily. 90 capsule 1  . FINACEA 15 % FOAM     . gabapentin (NEURONTIN) 100 MG capsule Take 100 mg by mouth at bedtime.    Marland Kitchen levocetirizine (XYZAL) 5 MG tablet Take 1 tablet (5 mg total) by mouth every evening. (Patient taking differently: Take 5 mg by mouth as needed. ) 30 tablet 0  . magnesium oxide (MAG-OX) 400 MG tablet Take 400 mg by mouth every morning.     . meloxicam (MOBIC) 15 MG tablet Take 15 mg by mouth as needed.     . mometasone (NASONEX) 50 MCG/ACT nasal spray Place 2 sprays into the nose every morning.     . naratriptan (AMERGE) 2.5 MG tablet Take 1 tablet (2.5 mg total) by mouth as needed. Take one (1) tablet at onset of headache; if returns or does not resolve, may repeat after 4 hours; do not exceed five (5) mg in 24 hours. 9 tablet 5  . omeprazole (PRILOSEC) 20 MG capsule Take 20 mg by mouth  daily.    . Probiotic Product (PROBIOTIC PO) Take 1 capsule by mouth daily.     . propranolol (INDERAL) 10 MG tablet Take 1 tablet by mouth daily. Take 1 tablet (10 mg) by mouth three times a day as needed for fast heart rates     . tamsulosin (FLOMAX) 0.4 MG CAPS capsule Take 0.8 mg by mouth daily after breakfast.     . topiramate (TOPAMAX) 25 MG tablet Take 1 tablet (25 mg total) by mouth at bedtime. Go up by one pill every 3 days to a max of 4 per night (Patient not taking: Reported on 09/13/2017) 60 tablet 0   No current facility-administered medications for this visit.      Allergies:   Adhesive [tape]; Levaquin  [levofloxacin]; Macrodantin [nitrofurantoin]; Metronidazole; Nitrofurantoin macrocrystal; Sulfamethoxazole-trimethoprim; Cephalexin; Clindamycin hcl; Doxycycline; and Erythromycin   Social History:  The patient  reports that she has never smoked. She has never used smokeless tobacco. She reports that she drank alcohol. She reports that  she does not use drugs.   Family History:   family history includes Asthma in her daughter, daughter, and daughter; Colitis in her daughter; Diabetes in her brother and mother; Fibromyalgia in her daughter; Hearing loss in her mother; Heart disease in her father and paternal grandfather; Hypertension in her brother and mother; Interstitial cystitis in her daughter; Migraines in her daughter.    Review of Systems: Review of Systems  Constitutional: Negative.   Respiratory: Negative.   Cardiovascular: Positive for palpitations.  Gastrointestinal: Negative.   Musculoskeletal: Negative.   Neurological: Negative.   Psychiatric/Behavioral: Negative.   All other systems reviewed and are negative.    PHYSICAL EXAM: VS:  BP 122/64 (BP Location: Left Arm, Patient Position: Sitting, Cuff Size: Large)   Pulse (!) 58   Ht 5\' 6"  (1.676 m)   Wt 210 lb 8 oz (95.5 kg)   BMI 33.98 kg/m  , BMI Body mass index is 33.98 kg/m. Constitutional:  oriented to  person, place, and time. No distress.  HENT:  Head: Normocephalic and atraumatic.  Eyes:  no discharge. No scleral icterus.  Neck: Normal range of motion. Neck supple. No JVD present.  Cardiovascular: Normal rate, regular rhythm, normal heart sounds and intact distal pulses. Exam reveals no gallop and no friction rub. No edema No murmur heard. Pulmonary/Chest: Effort normal and breath sounds normal. No stridor. No respiratory distress.  no wheezes.  no rales.  no tenderness.  Abdominal: Soft.  no distension.  no tenderness.  Musculoskeletal: Normal range of motion.  no  tenderness or deformity.  Neurological:  normal muscle tone. Coordination normal. No atrophy Skin: Skin is warm and dry. No rash noted. not diaphoretic.  Psychiatric:  normal mood and affect. behavior is normal. Thought content normal.    Recent Labs: 02/11/2017: TSH 2.22 08/24/2017: ALT 14; BUN 21; Creat 0.87; Hemoglobin 13.7; Platelets 300; Potassium 4.4; Sodium 139    Lipid Panel Lab Results  Component Value Date   CHOL 208 (H) 08/24/2017   HDL 68 08/24/2017   LDLCALC 124 (H) 08/24/2017   TRIG 70 08/24/2017      Wt Readings from Last 3 Encounters:  09/13/17 210 lb 8 oz (95.5 kg)  08/30/17 209 lb 3.2 oz (94.9 kg)  08/10/17 207 lb 4.8 oz (94 kg)       ASSESSMENT AND PLAN:  Atrial flutter, unspecified type ([place]) - Plan: EKG 12-Lead Also episodes of atrial fibrillation On anticoagulation for stroke prevention Discussed various treatment options with her She does not want to retry amiodarone Recommended she start flecainide 50 mg twice a day Given her bradycardia we will hold off on adding around-the-clock beta blocker For breakthrough arrhythmia recommended she take extra flecainide with propranolol Flecainide treadmill ordered for 2 weeks  Hyperlipidemia Cholesterol down to 170 with dietary changes CT coronary calcium score 0  Disposition:   F/U  6 months   Total encounter time more than 25  minutes  Greater than 50% was spent in counseling and coordination of care with the patient    Orders Placed This Encounter  Procedures  . EKG 12-Lead     Signed, Esmond Plants, M.D., Ph.D. 09/13/2017  [place], [place]

## 2017-09-13 ENCOUNTER — Encounter: Payer: Self-pay | Admitting: Cardiovascular Disease

## 2017-09-13 ENCOUNTER — Other Ambulatory Visit: Payer: Self-pay | Admitting: Family Medicine

## 2017-09-13 ENCOUNTER — Ambulatory Visit: Payer: 59 | Admitting: Cardiovascular Disease

## 2017-09-13 VITALS — BP 122/64 | HR 58 | Ht 66.0 in | Wt 210.5 lb

## 2017-09-13 DIAGNOSIS — I4892 Unspecified atrial flutter: Secondary | ICD-10-CM | POA: Diagnosis not present

## 2017-09-13 DIAGNOSIS — E119 Type 2 diabetes mellitus without complications: Secondary | ICD-10-CM

## 2017-09-13 DIAGNOSIS — E782 Mixed hyperlipidemia: Secondary | ICD-10-CM

## 2017-09-13 DIAGNOSIS — Z719 Counseling, unspecified: Secondary | ICD-10-CM | POA: Diagnosis not present

## 2017-09-13 DIAGNOSIS — G43009 Migraine without aura, not intractable, without status migrainosus: Secondary | ICD-10-CM

## 2017-09-13 DIAGNOSIS — I480 Paroxysmal atrial fibrillation: Secondary | ICD-10-CM | POA: Insufficient documentation

## 2017-09-13 MED ORDER — FLECAINIDE ACETATE 50 MG PO TABS: 50.0000 mg | ORAL_TABLET | Freq: Two times a day (BID) | ORAL | 6 refills | Status: DC

## 2017-09-13 NOTE — Patient Instructions (Addendum)
Medication Instructions:   Please start flecainide 50 mg twice a day  For breakthrough atrial fib or flutter Take extra flecainide and take a propranolol  Labwork:  No new labs needed  Testing/Procedures:  Flecainide --->  treadmill in the office in 2 to 3 weeks   Follow-Up: It was a pleasure seeing you in the office today. Please call us if you have new issues that need to be addressed before your next appt.  [phone]  Your physician wants you to follow-up in: 6 months.  You will receive a reminder letter in the mail two months in advance. If you don't receive a letter, please call our office to schedule the follow-up appointment.  If you need a refill on your cardiac medications before your next appointment, please call your pharmacy.  For educational health videos Log in to : [email or web address] Or : [email or web address], password : [email or web address]

## 2017-09-13 NOTE — Telephone Encounter (Signed)
Refill request for general medication. Naratriptan to [facility]   Last office visit 08/30/2017  Follow up on 03/02/2018

## 2017-09-19 NOTE — Progress Notes (Signed)
09/20/2017 11:06 AM   Sonya Lopez 16-Sep-1955 [id number]  Referring provider: Steele Sizer, MD [address] [place], [place] [address]  Chief Complaint  Patient presents with  . Urinary Incontinence    HPI: Patient is a 62 -year-old Caucasian female who is referred to [name] by Dr. Steele Sizer for urinary incontinence.  Patient states that she has had urinary incontinence for since 2014.   She was given a prescription for tamsulosin 0.8 mg daily.    Patient has incontinence with stress incontinence and urge incontinence.  She states that she often has to stand after urinating and sit back down to empty her bladder,  She is having one accident during the day.  She is experiencing one accident at night.    Her incontinence volume is moderate.  She is wearing 1 to 2 pads/depends daily.    She is having associated urinary frequency x q 1 1/2 hours in the morning and q 2 hours in the afternoon, strong urgency, nocturia x 1-3, intermittency and hesitancy.  Patient denies any gross hematuria, but she is seeing red specks in her urine daily, dysuria or suprapubic/flank pain.  Patient denies any fevers, chills, nausea or vomiting.   She has a history of nephrolithiasis.  She has spontaneously passed two stones.  She does not have a recent history of urinary tract infections.  She had a bladder tacking with her hysterectomy in 2013.  Dr. Clovia Cuff and Dr. Matilde Sprang performed the cystoscopy, cystocele repair, rectocele repair, vault prolapse and sling and graft.    She is not sexually active.    She is post menopausal.  She has a history of breast cancer.    She denies constipation and/or diarrhea.  She is drinking two 16 ounces at breakfast, 8 ounces with lunch and 16 ounces with dinner of water daily.   She drinks an occasional soda.  She does not drink tea.  She does not drink juices.  She is not drinking alcohol  Her PVR is 20 mL.    She has two daughters with  IC.    PMH: Past Medical History:  Diagnosis Date  . Allergy   . Anxiety    situational anxiety  . Arrhythmia    A-fib/A-flutter  . Atrial flutter ([place])   . Breast cancer ([place]) 09/2015   left breast cancer with lumpectomy and rad tx  . 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   . Depression   . Difficult intubation 2009   [place] had to use  'boogee"  . Female cystocele 01/05/2011  . Fibromyalgia   . GERD (gastroesophageal reflux disease)   . Headache(784.0)   . Kidney stones   . Obesity   . Personal history of radiation therapy   . PONV (postoperative nausea and vomiting)    h/o difficult intubation and anes had to use "boogee"    Surgical History: Past Surgical History:  Procedure Laterality Date  . ANTERIOR AND POSTERIOR REPAIR  11/30/2011   Procedure: ANTERIOR (CYSTOCELE) AND POSTERIOR REPAIR (RECTOCELE);  Surgeon: Reece Packer, MD;  Location: [facility];  Service: Urology;  Laterality: N/A;  with cysto  . BLADDER SUSPENSION  11/30/2011   Procedure: [facility] PROCEDURE;  Surgeon: Reece Packer, MD;  Location: [facility];  Service: Urology;  Laterality: N/A;  graft 10x6  . BREAST BIOPSY Right 2007   negative core  . BREAST BIOPSY  Left 09/16/2015   DCIS  . BREAST LUMPECTOMY Left 09/30/2015   DCIS clear margins and rad tx  . BREAST LUMPECTOMY WITH NEEDLE LOCALIZATION Left 09/30/2015   Procedure: BREAST LUMPECTOMY WITH NEEDLE LOCALIZATION;  Surgeon: Robert Bellow, MD;  Location: [facility];  Service: General;  Laterality: Left;  . CESAREAN SECTION     x 1  . COLONOSCOPY  2009  . CYSTOSCOPY  11/30/2011   Procedure: CYSTOSCOPY;  Surgeon: Reece Packer, MD;  Location: [facility];  Service: Urology;;  . Brigitte Pulse AND CURETTAGE OF UTERUS  05/05/2007  . GALLBLADDER SURGERY  2004  . TONSILLECTOMY  1961  . VAGINAL HYSTERECTOMY  11/30/2011   Procedure: HYSTERECTOMY VAGINAL;  Surgeon: Emily Filbert, MD;  Location: [facility];  Service: Gynecology;  Laterality: N/A;    Home Medications:  Allergies as of 09/20/2017      Reactions   Adhesive [tape] Dermatitis   SKIN TURNS RED-PAPER TAPE OK TO USE   Levaquin  [levofloxacin]    Macrodantin [nitrofurantoin] Nausea And Vomiting   Metronidazole Nausea Only   Nitrofurantoin Macrocrystal Nausea And Vomiting   Sulfamethoxazole-trimethoprim Nausea Only   Cephalexin Rash   Clindamycin Hcl Rash   Doxycycline Swelling, Rash   Erythromycin Rash      Medication List        Accurate as of 09/20/17 11:06 AM. Always use your most recent med list.          apixaban 5 MG Tabs tablet Commonly known as:  ELIQUIS Take 1 tablet (5 mg total) by mouth 2 (two) times daily.   Clindamycin Phosphate foam Apply 1 Units topically as needed. For rosacea   cyclobenzaprine 5 MG tablet Commonly known as:  FLEXERIL Take 5 mg by mouth 2 (two) times daily as needed for muscle spasms.   DULoxetine 60 MG capsule Commonly known as:  CYMBALTA Take 1 capsule (60 mg total) by mouth daily.   FINACEA 15 % Foam Generic drug:  Azelaic Acid   flecainide 50 MG tablet Commonly known as:  TAMBOCOR Take 1 tablet (50 mg total) by mouth 2 (two) times daily.   gabapentin 100 MG capsule Commonly known as:  NEURONTIN Take 100 mg by mouth at bedtime.   levocetirizine 5 MG tablet Commonly known as:  XYZAL Take 1 tablet (5 mg total) by mouth every evening.   magnesium oxide 400 MG tablet Commonly known as:  MAG-OX Take 400 mg by mouth every morning.   meloxicam 15 MG tablet Commonly known as:  MOBIC Take 15 mg by mouth as needed.   naratriptan 2.5 MG tablet Commonly known as:  AMERGE TAKE 1 TABLET AT ONSET OF HEADACHE MAY REPEAT AFTER 4 HOURS IF RETURNS OR DOES NOT RESOLVE MAX OF 2 TABS PER DAY   NASONEX 50 MCG/ACT nasal spray Generic drug:  mometasone Place 2 sprays into the nose every morning.   omeprazole 20 MG capsule Commonly known as:   PRILOSEC Take 20 mg by mouth daily.   PROBIOTIC PO Take 1 capsule by mouth daily.   propranolol 10 MG tablet Commonly known as:  INDERAL Take 1 tablet by mouth daily. Take 1 tablet (10 mg) by mouth three times a day as needed for fast heart rates   tamsulosin 0.4 MG Caps capsule Commonly known as:  FLOMAX Take 0.8 mg by mouth daily after breakfast.   topiramate 25 MG tablet Commonly known as:  TOPAMAX Take 1 tablet (25 mg total) by mouth at bedtime. Go up  by one pill every 3 days to a max of 4 per night       Allergies:  Allergies  Allergen Reactions  . Adhesive [Tape] Dermatitis    SKIN TURNS RED-PAPER TAPE OK TO USE  . Levaquin  [Levofloxacin]   . Macrodantin [Nitrofurantoin] Nausea And Vomiting  . Metronidazole Nausea Only  . Nitrofurantoin Macrocrystal Nausea And Vomiting  . Sulfamethoxazole-Trimethoprim Nausea Only  . Cephalexin Rash  . Clindamycin Hcl Rash  . Doxycycline Swelling and Rash  . Erythromycin Rash    Family History: Family History  Problem Relation Age of Onset  . Diabetes Mother   . Hearing loss Mother   . Hypertension Mother   . Heart disease Father   . Heart disease Paternal Grandfather   . Asthma Daughter   . Asthma Daughter   . Migraines Daughter   . Fibromyalgia Daughter   . Interstitial cystitis Daughter   . Asthma Daughter   . Colitis Daughter   . Diabetes Brother   . Hypertension Brother   . Breast cancer Neg Hx     Social History:  reports that she has never smoked. She has never used smokeless tobacco. She reports that she drank alcohol. She reports that she does not use drugs.  ROS: UROLOGY Frequent Urination?: Yes Hard to postpone urination?: Yes Burning/pain with urination?: No Get up at night to urinate?: Yes Leakage of urine?: Yes Urine stream starts and stops?: Yes Trouble starting stream?: Yes Do you have to strain to urinate?: No Blood in urine?: No Urinary tract infection?: No Sexually transmitted disease?:  No Injury to kidneys or bladder?: No Painful intercourse?: No Weak stream?: No Currently pregnant?: No Vaginal bleeding?: No Last menstrual period?: Hysterectomy   Gastrointestinal Nausea?: No Vomiting?: No Indigestion/heartburn?: No Diarrhea?: No Constipation?: No  Constitutional Fever: No Night sweats?: No Weight loss?: No Fatigue?: No  Skin Skin rash/lesions?: No Itching?: No  Eyes Blurred vision?: No Double vision?: No  Ears/Nose/Throat Sore throat?: No Sinus problems?: Yes  Hematologic/Lymphatic Swollen glands?: No Easy bruising?: Yes  Cardiovascular Leg swelling?: No Chest pain?: No  Respiratory Cough?: No Shortness of breath?: No  Endocrine Excessive thirst?: No  Musculoskeletal Back pain?: No Joint pain?: No  Neurological Headaches?: Yes Dizziness?: Yes  Psychologic Depression?: No Anxiety?: Yes  Physical Exam: BP 109/73 (BP Location: Left Arm, Patient Position: Sitting, Cuff Size: Large)   Pulse 76   Ht 5\' 6"  (1.676 m)   Wt 210 lb (95.3 kg)   BMI 33.89 kg/m   Constitutional: Well nourished. Alert and oriented, No acute distress. HEENT: [place] AT, moist mucus membranes. Trachea midline, no masses. Cardiovascular: No clubbing, cyanosis, or edema. Respiratory: Normal respiratory effort, no increased work of breathing. GI: Abdomen is soft, non tender, non distended, no abdominal masses. Liver and spleen not palpable.  No hernias appreciated.  Stool sample for occult testing is not indicated.   GU: No CVA tenderness.  No bladder fullness or masses.  Atrophic external genitalia, normal pubic hair distribution, no lesions.  Normal urethral meatus, no lesions, no prolapse, no discharge.   No urethral masses, tenderness and/or tenderness. No bladder fullness, tenderness or masses. Pale vagina mucosa, poor estrogen effect, no discharge, no lesions, poor pelvic support, grade II cystocele and no rectocele noted.  Cervix, uterus and adnexa are  surgically absent.   Anus and perineum are without rashes or lesions.    Skin: No rashes, bruises or suspicious lesions. Lymph: No cervical or inguinal adenopathy. Neurologic: Grossly intact, no  focal deficits, moving all 4 extremities. Psychiatric: Normal mood and affect.  Laboratory Data: Lab Results  Component Value Date   WBC 7.0 08/24/2017   HGB 13.7 08/24/2017   HCT 40.8 08/24/2017   MCV 92.7 08/24/2017   PLT 300 08/24/2017    Lab Results  Component Value Date   CREATININE 0.87 08/24/2017    No results found for: PSA  No results found for: TESTOSTERONE  Lab Results  Component Value Date   HGBA1C 5.7 (H) 08/24/2017    Lab Results  Component Value Date   TSH 2.22 02/11/2017       Component Value Date/Time   CHOL 208 (H) 08/24/2017 0825   CHOL 209 (H) 06/04/2015 0753   CHOL 164 04/17/2011 0507   HDL 68 08/24/2017 0825   HDL 58 06/04/2015 0753   HDL 43 04/17/2011 0507   CHOLHDL 3.1 08/24/2017 0825   VLDL 15 01/05/2016 0828   VLDL 17 04/17/2011 0507   LDLCALC 124 (H) 08/24/2017 0825   LDLCALC 104 (H) 04/17/2011 0507    Lab Results  Component Value Date   AST 13 08/24/2017   Lab Results  Component Value Date   ALT 14 08/24/2017   No components found for: ALKALINEPHOPHATASE No components found for: BILIRUBINTOTAL  No results found for: ESTRADIOL  Urinalysis    Component Value Date/Time   BILIRUBINUR negative 08/01/2014 1112   PROTEINUR negative 08/01/2014 1112   UROBILINOGEN 0.2 08/01/2014 1112   NITRITE negat 08/01/2014 1112   LEUKOCYTESUR Negative 08/01/2014 1112    I have reviewed the labs.   Pertinent Imaging: Results for Sonya, Lopez (MRN [id number]) as of 09/20/2017 10:52  Ref. Range 09/20/2017 09:39  Scan Result Unknown 41mL     Assessment & Plan:    1. Mixed Incontinence Discussed behavioral therapies, bladder training and bladder control strategies Discussed the role of UDS and how it may be helpful for further  investigation-she declines at this time Offered medical therapy Toviaz 4 mg samples, #28 as Myrbetriq interacts with Flecainide  She will discontinue the Flomax at this time RTC in 3 weeks for PVR and symptom recheck   2. Vaginal atrophy Patient not a candidate due to history of breast cancer for vaginal estrogen cream   Return in about 3 weeks (around 10/11/2017) for PVR and OAB questionnaire.  These notes generated with voice recognition software. I apologize for typographical errors.  Zara Council, PA-C  [facility] [address] [place], [place] [address] [phone]

## 2017-09-20 ENCOUNTER — Ambulatory Visit: Payer: 59 | Admitting: Urology

## 2017-09-20 ENCOUNTER — Encounter: Payer: Self-pay | Admitting: Urology

## 2017-09-20 VITALS — BP 109/73 | HR 76 | Ht 66.0 in | Wt 210.0 lb

## 2017-09-20 DIAGNOSIS — Z719 Counseling, unspecified: Secondary | ICD-10-CM | POA: Diagnosis not present

## 2017-09-20 DIAGNOSIS — N3946 Mixed incontinence: Secondary | ICD-10-CM

## 2017-09-20 LAB — BLADDER SCAN AMB NON-IMAGING

## 2017-09-20 MED ORDER — FESOTERODINE FUMARATE ER 4 MG PO TB24: 4.0000 mg | ORAL_TABLET | Freq: Every day | ORAL | 0 refills | Status: DC

## 2017-09-21 ENCOUNTER — Ambulatory Visit
Admission: RE | Admit: 2017-09-21 | Discharge: 2017-09-21 | Disposition: A | Discharge status: Home / Self Care | Payer: 59 | Source: Ambulatory Visit | Attending: General Surgery | Admitting: General Surgery

## 2017-09-21 DIAGNOSIS — D0512 Intraductal carcinoma in situ of left breast: Secondary | ICD-10-CM | POA: Diagnosis not present

## 2017-09-21 DIAGNOSIS — R922 Inconclusive mammogram: Secondary | ICD-10-CM | POA: Diagnosis not present

## 2017-09-27 ENCOUNTER — Ambulatory Visit (INDEPENDENT_AMBULATORY_CARE_PROVIDER_SITE_OTHER): Payer: 59

## 2017-09-27 ENCOUNTER — Encounter: Payer: Self-pay | Admitting: General Surgery

## 2017-09-27 ENCOUNTER — Ambulatory Visit: Payer: 59 | Admitting: General Surgery

## 2017-09-27 VITALS — BP 132/76 | HR 72 | Resp 12 | Ht 66.0 in | Wt 210.0 lb

## 2017-09-27 DIAGNOSIS — I4892 Unspecified atrial flutter: Secondary | ICD-10-CM

## 2017-09-27 DIAGNOSIS — D0512 Intraductal carcinoma in situ of left breast: Secondary | ICD-10-CM | POA: Diagnosis not present

## 2017-09-27 NOTE — Patient Instructions (Signed)
The patient has been asked to return to the office in one year with a bilateral diagnostic mammogram.The patient is aware to call back for any questions or concerns. 

## 2017-09-27 NOTE — Progress Notes (Signed)
Patient ID: Sonya Lopez, female   DOB: 1955-09-10, 62 y.o.   MRN: [id number]  Chief Complaint  Patient presents with  . Follow-up    HPI Sonya Lopez is a 62 y.o. female who presents for a breast evaluation. The most recent mammogram was done on 09/21/2017.  Patient does perform regular self breast checks and gets regular mammograms done.   Color guard was done 2019.  HPI  Past Medical History:  Diagnosis Date  . Allergy   . Anxiety    situational anxiety  . Arrhythmia    A-fib/A-flutter  . Atrial flutter ([place])   . Breast cancer ([place]) 09/2015   left breast cancer with lumpectomy and rad tx  . 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   . Depression   . Difficult intubation 2009   [place] had to use  'boogee"  . Female cystocele 01/05/2011  . Fibromyalgia   . GERD (gastroesophageal reflux disease)   . Headache(784.0)   . Kidney stones   . Obesity   . Personal history of radiation therapy 2017   LEFT lumpectomy  . PONV (postoperative nausea and vomiting)    h/o difficult intubation and anes had to use "boogee"    Past Surgical History:  Procedure Laterality Date  . ANTERIOR AND POSTERIOR REPAIR  11/30/2011   Procedure: ANTERIOR (CYSTOCELE) AND POSTERIOR REPAIR (RECTOCELE);  Surgeon: Reece Packer, MD;  Location: [facility];  Service: Urology;  Laterality: N/A;  with cysto  . BLADDER SUSPENSION  11/30/2011   Procedure: [facility] PROCEDURE;  Surgeon: Reece Packer, MD;  Location: [facility];  Service: Urology;  Laterality: N/A;  graft 10x6  . BREAST BIOPSY Right 2007   negative core  . BREAST BIOPSY Left 09/16/2015   DCIS  . BREAST LUMPECTOMY Left 09/30/2015   DCIS clear margins and rad tx/HIGH GRADE DUCTAL CARCINOMA IN SITU, COMEDO TYPE  . BREAST LUMPECTOMY WITH NEEDLE LOCALIZATION Left 09/30/2015   Procedure: BREAST LUMPECTOMY WITH NEEDLE LOCALIZATION;  Surgeon: Robert Bellow, MD;  Location: [facility];  Service: General;  Laterality: Left;  . CESAREAN SECTION     x 1  . COLONOSCOPY  2009  . CYSTOSCOPY  11/30/2011   Procedure: CYSTOSCOPY;  Surgeon: Reece Packer, MD;  Location: [place] [facility];  Service: Urology;;  . Brigitte Pulse AND CURETTAGE OF UTERUS  05/05/2007  . GALLBLADDER SURGERY  2004  . TONSILLECTOMY  1961  . VAGINAL HYSTERECTOMY  11/30/2011   Procedure: HYSTERECTOMY VAGINAL;  Surgeon: Emily Filbert, MD;  Location: [facility];  Service: Gynecology;  Laterality: N/A;    Family History  Problem Relation Age of Onset  . Diabetes Mother   . Hearing loss Mother   . Hypertension Mother   . Heart disease Father   . Heart disease Paternal Grandfather   . Asthma Daughter   . Asthma Daughter   . Migraines Daughter   . Fibromyalgia Daughter   . Interstitial cystitis Daughter   . Asthma Daughter   . Colitis Daughter   . Thyroid cancer Daughter 65  . Diabetes Brother   . Hypertension Brother   . Breast cancer Neg Hx     Social History Social History   Tobacco Use  . Smoking status: Never Smoker  . Smokeless tobacco: Never Used  Substance Use Topics  . Alcohol use: Not Currently    Alcohol/week: 0.0 standard drinks    Frequency: Never  Comment: Due to AF  . Drug use: No    Allergies  Allergen Reactions  . Adhesive [Tape] Dermatitis    SKIN TURNS RED-PAPER TAPE OK TO USE  . Levaquin  [Levofloxacin]   . Macrodantin [Nitrofurantoin] Nausea And Vomiting  . Metronidazole Nausea Only  . Nitrofurantoin Macrocrystal Nausea And Vomiting  . Sulfamethoxazole-Trimethoprim Nausea Only  . Cephalexin Rash  . Clindamycin Hcl Rash  . Doxycycline Swelling and Rash  . Erythromycin Rash    Current Outpatient Medications  Medication Sig Dispense Refill  . apixaban (ELIQUIS) 5 MG TABS tablet Take 1 tablet (5 mg total) by mouth 2 (two) times daily. 60 tablet 11  . Clindamycin Phosphate foam Apply 1 Units topically as needed. For rosacea    5  .  cyclobenzaprine (FLEXERIL) 5 MG tablet Take 5 mg by mouth 2 (two) times daily as needed for muscle spasms.    . DULoxetine (CYMBALTA) 60 MG capsule Take 1 capsule (60 mg total) by mouth daily. 90 capsule 1  . FINACEA 15 % FOAM     . flecainide (TAMBOCOR) 50 MG tablet Take 1 tablet (50 mg total) by mouth 2 (two) times daily. 60 tablet 6  . gabapentin (NEURONTIN) 100 MG capsule Take 100 mg by mouth at bedtime.    Marland Kitchen levocetirizine (XYZAL) 5 MG tablet Take 1 tablet (5 mg total) by mouth every evening. (Patient taking differently: Take 5 mg by mouth as needed. ) 30 tablet 0  . magnesium oxide (MAG-OX) 400 MG tablet Take 400 mg by mouth every morning.     . meloxicam (MOBIC) 15 MG tablet Take 15 mg by mouth as needed.     . mometasone (NASONEX) 50 MCG/ACT nasal spray Place 2 sprays into the nose every morning.     . naratriptan (AMERGE) 2.5 MG tablet TAKE 1 TABLET AT ONSET OF HEADACHE MAY REPEAT AFTER 4 HOURS IF RETURNS OR DOES NOT RESOLVE MAX OF 2 TABS PER DAY 9 tablet 5  . omeprazole (PRILOSEC) 20 MG capsule Take 20 mg by mouth daily.    . Probiotic Product (PROBIOTIC PO) Take 1 capsule by mouth daily.     . propranolol (INDERAL) 10 MG tablet Take 1 tablet by mouth daily. Take 1 tablet (10 mg) by mouth three times a day as needed for fast heart rates     . tamsulosin (FLOMAX) 0.4 MG CAPS capsule Take 0.8 mg by mouth daily after breakfast.     . topiramate (TOPAMAX) 25 MG tablet Take 1 tablet (25 mg total) by mouth at bedtime. Go up by one pill every 3 days to a max of 4 per night 60 tablet 0   No current facility-administered medications for this visit.     Review of Systems Review of Systems  Constitutional: Negative.   Respiratory: Negative.   Cardiovascular: Negative.     Blood pressure 132/76, pulse 72, resp. rate 12, height 5\' 6"  (1.676 m), weight 210 lb (95.3 kg).  Physical Exam Physical Exam  Constitutional: She is oriented to person, place, and time. She appears well-developed and  well-nourished.  Eyes: Conjunctivae are normal. No scleral icterus.  Neck: Neck supple.  Cardiovascular: Normal rate, regular rhythm and normal heart sounds.  Pulmonary/Chest: Effort normal and breath sounds normal. Right breast exhibits no inverted nipple, no mass, no nipple discharge, no skin change and no tenderness. Left breast exhibits no inverted nipple, no mass, no nipple discharge, no skin change and no tenderness.  Lymphadenopathy:    She  has no cervical adenopathy.    She has no axillary adenopathy.  Neurological: She is alert and oriented to person, place, and time.  Skin: Skin is warm and dry.    Data Reviewed March 02, 2017 Cologuard testing: Negative  September 21, 2017 bilateral diagnostic mammograms reviewed.  Postsurgical changes in the left breast.  BI-RADS-2.  Assessment    Benign breast exam.  Inability to tolerate antiestrogen therapy.    Plan The patient has been asked to return to the office in one year with a bilateral diagnostic mammogram. The patient is aware to call back for any questions or concerns.    HPI, Physical Exam, Assessment and Plan have been scribed under the direction and in the presence of Hervey Ard, MD.  Gaspar Cola, CMA  I have completed the exam and reviewed the above documentation for accuracy and completeness.  I agree with the above.  Haematologist has been used and any errors in dictation or transcription are unintentional.  Hervey Ard, M.D., F.A.C.S.  Forest Gleason Lyndel Dancel 09/27/2017, 3:08 PM

## 2017-09-29 LAB — EXERCISE TOLERANCE TEST
CHL RATE OF PERCEIVED EXERTION: 16
CSEPED: 5 min
CSEPHR: 87 %
Estimated workload: 7 METS
Exercise duration (sec): 57 s
MPHR: 156 {beats}/min
Peak HR: 136 {beats}/min
Rest HR: 75 {beats}/min

## 2017-10-05 DIAGNOSIS — Z719 Counseling, unspecified: Secondary | ICD-10-CM | POA: Diagnosis not present

## 2017-10-06 DIAGNOSIS — Z719 Counseling, unspecified: Secondary | ICD-10-CM | POA: Diagnosis not present

## 2017-10-10 ENCOUNTER — Encounter: Payer: Self-pay | Admitting: General Surgery

## 2017-10-11 DIAGNOSIS — Z719 Counseling, unspecified: Secondary | ICD-10-CM | POA: Diagnosis not present

## 2017-10-12 ENCOUNTER — Encounter: Payer: Self-pay | Admitting: Urology

## 2017-10-12 ENCOUNTER — Ambulatory Visit: Payer: 59 | Admitting: Urology

## 2017-10-12 VITALS — BP 101/62 | HR 97 | Ht 66.0 in | Wt 209.1 lb

## 2017-10-12 DIAGNOSIS — N3946 Mixed incontinence: Secondary | ICD-10-CM

## 2017-10-12 DIAGNOSIS — N952 Postmenopausal atrophic vaginitis: Secondary | ICD-10-CM | POA: Diagnosis not present

## 2017-10-12 LAB — MICROSCOPIC EXAMINATION
RBC, UA: NONE SEEN /hpf (ref 0–2)
WBC UA: NONE SEEN /HPF (ref 0–5)

## 2017-10-12 LAB — URINALYSIS, COMPLETE
Bilirubin, UA: NEGATIVE
Glucose, UA: NEGATIVE
KETONES UA: NEGATIVE
Leukocytes, UA: NEGATIVE
NITRITE UA: NEGATIVE
Protein, UA: NEGATIVE
RBC, UA: NEGATIVE
Specific Gravity, UA: 1.015 (ref 1.005–1.030)
Urobilinogen, Ur: 0.2 mg/dL (ref 0.2–1.0)
pH, UA: 6 (ref 5.0–7.5)

## 2017-10-12 LAB — BLADDER SCAN AMB NON-IMAGING: Scan Result: 47

## 2017-10-12 MED ORDER — AMOXICILLIN-POT CLAVULANATE 875-125 MG PO TABS: 1.0000 | ORAL_TABLET | Freq: Two times a day (BID) | ORAL | 0 refills | Status: DC

## 2017-10-12 MED ORDER — TAMSULOSIN HCL 0.4 MG PO CAPS: 0.8000 mg | ORAL_CAPSULE | Freq: Every day | ORAL | 3 refills | Status: DC

## 2017-10-12 NOTE — Progress Notes (Addendum)
10/12/2017 10:38 AM   Sonya Lopez September 08, 1955 [id number]  Referring provider: Steele Sizer, MD [address] Sonya Lopez, [place] [address]  Chief Complaint  Patient presents with  . Urinary Incontinence    HPI: Patient is 62 year old Caucasian female with a history of mixed urinary incontinence and vaginal atrophy who presents today after a 3-week trial of Toviaz 4 mg daily.  Background history Patient is a 4 -year-old Caucasian female who is referred to [name] by Dr. Steele Sizer for urinary incontinence.  Patient states that she has had urinary incontinence for since 2014.   She was given a prescription for tamsulosin 0.8 mg daily.  Patient has incontinence with stress incontinence and urge incontinence.  She states that she often has to stand after urinating and sit back down to empty her bladder,  She is having one accident during the day.  She is experiencing one accident at night.  Her incontinence volume is moderate.  She is wearing 1 to 2 pads/depends daily.  She is having associated urinary frequency x q 1 1/2 hours in the morning and q 2 hours in the afternoon, strong urgency, nocturia x 1-3, intermittency and hesitancy.  Patient denies any gross hematuria, but she is seeing red specks in her urine daily, dysuria or suprapubic/flank pain.  Patient denies any fevers, chills, nausea or vomiting.  She has a history of nephrolithiasis.  She has spontaneously passed two stones.  She does not have a recent history of urinary tract infections.  She had a bladder tacking with her hysterectomy in 2013.  Dr. Clovia Cuff and Dr. Matilde Sprang performed the cystoscopy, cystocele repair, rectocele repair, vault prolapse and sling and graft.  She is not sexually active.  She is post menopausal.  She has a history of breast cancer. She denies constipation and/or diarrhea.  She is drinking two 16 ounces at breakfast, 8 ounces with lunch and 16 ounces with dinner of water daily.   She  drinks an occasional soda.  She does not drink tea.  She does not drink juices.  She is not drinking alcohol.  Her PVR is 20 mL.   She has two daughters with IC.    The patient has been experiencing urgency x 0-3, frequency x 8 or more, not restricting fluids to avoid visits to the restroom, is engaging in toilet mapping, incontinence x 4-7 and nocturia x 4-7.   Her PVR is 47 mL.   Patient denies any gross hematuria, dysuria or suprapubic/flank pain.  Patient denies any fevers, chills, nausea or vomiting.   She states that she did discontinue the the tamsulosin while she was on the Toviaz at first, but she reinstated the tamsulosin as she noted she had difficulty urinating.  She still has difficulty passing her urine today, but her PVR is minimal.    Her UA was positive for moderate bacteria.    PMH: Past Medical History:  Diagnosis Date  . Allergy   . Anxiety    situational anxiety  . Arrhythmia    A-fib/A-flutter  . Atrial flutter ([place])   . 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  MammoSite  . Depression   . Difficult intubation 2009   [place] had to use  'boogee"  . Female cystocele 01/05/2011  . Fibromyalgia   . GERD (gastroesophageal reflux disease)   . Headache(784.0)   . Kidney stones   . Obesity   .  Personal history of radiation therapy 2017   LEFT lumpectomy  . PONV (postoperative nausea and vomiting)    h/o difficult intubation and anes had to use "boogee"    Surgical History: Past Surgical History:  Procedure Laterality Date  . ANTERIOR AND POSTERIOR REPAIR  11/30/2011   Procedure: ANTERIOR (CYSTOCELE) AND POSTERIOR REPAIR (RECTOCELE);  Surgeon: Reece Packer, MD;  Location: [facility];  Service: Urology;  Laterality: N/A;  with cysto  . BLADDER SUSPENSION  11/30/2011   Procedure: [facility] PROCEDURE;  Surgeon: Reece Packer, MD;  Location: [facility];  Service: Urology;  Laterality:  N/A;  graft 10x6  . BREAST BIOPSY Right 2007   negative core  . BREAST BIOPSY Left 09/16/2015   DCIS  . BREAST LUMPECTOMY Left 09/30/2015   DCIS clear margins and rad tx/HIGH GRADE DUCTAL CARCINOMA IN SITU, COMEDO TYPE  . BREAST LUMPECTOMY WITH NEEDLE LOCALIZATION Left 09/30/2015   DCIS excision, MammoSite radiation;  Surgeon: Robert Bellow, MD;  Location: [facility];  Service: General;  Laterality: Left;  . CESAREAN SECTION     x 1  . COLONOSCOPY  2009  . CYSTOSCOPY  11/30/2011   Procedure: CYSTOSCOPY;  Surgeon: Reece Packer, MD;  Location: [facility];  Service: Urology;;  . Brigitte Pulse AND CURETTAGE OF UTERUS  05/05/2007  . GALLBLADDER SURGERY  2004  . TONSILLECTOMY  1961  . VAGINAL HYSTERECTOMY  11/30/2011   Procedure: HYSTERECTOMY VAGINAL;  Surgeon: Emily Filbert, MD;  Location: [facility];  Service: Gynecology;  Laterality: N/A;    Home Medications:  Allergies as of 10/12/2017      Reactions   Adhesive [tape] Dermatitis   SKIN TURNS RED-PAPER TAPE OK TO USE   Levaquin  [levofloxacin]    Macrodantin [nitrofurantoin] Nausea And Vomiting   Metronidazole Nausea Only   Nitrofurantoin Macrocrystal Nausea And Vomiting   Sulfamethoxazole-trimethoprim Nausea Only   Cephalexin Rash   Clindamycin Hcl Rash   Doxycycline Swelling, Rash   Erythromycin Rash      Medication List        Accurate as of 10/12/17 10:38 AM. Always use your most recent med list.          amoxicillin-clavulanate 875-125 MG tablet Commonly known as:  AUGMENTIN Take 1 tablet by mouth every 12 (twelve) hours.   apixaban 5 MG Tabs tablet Commonly known as:  ELIQUIS Take 1 tablet (5 mg total) by mouth 2 (two) times daily.   Clindamycin Phosphate foam Apply 1 Units topically as needed. For rosacea   cyclobenzaprine 5 MG tablet Commonly known as:  FLEXERIL Take 5 mg by mouth 2 (two) times daily as needed for muscle spasms.   DULoxetine 60 MG capsule Commonly known as:  CYMBALTA Take 1 capsule (60 mg total)  by mouth daily.   FINACEA 15 % Foam Generic drug:  Azelaic Acid   flecainide 50 MG tablet Commonly known as:  TAMBOCOR Take 1 tablet (50 mg total) by mouth 2 (two) times daily.   gabapentin 100 MG capsule Commonly known as:  NEURONTIN Take 100 mg by mouth at bedtime.   levocetirizine 5 MG tablet Commonly known as:  XYZAL Take 1 tablet (5 mg total) by mouth every evening.   magnesium oxide 400 MG tablet Commonly known as:  MAG-OX Take 400 mg by mouth every morning.   meloxicam 15 MG tablet Commonly known as:  MOBIC Take 15 mg by mouth as needed.   naratriptan 2.5 MG tablet Commonly known as:  AMERGE TAKE  1 TABLET AT ONSET OF HEADACHE MAY REPEAT AFTER 4 HOURS IF RETURNS OR DOES NOT RESOLVE MAX OF 2 TABS PER DAY   NASONEX 50 MCG/ACT nasal spray Generic drug:  mometasone Place 2 sprays into the nose every morning.   omeprazole 20 MG capsule Commonly known as:  PRILOSEC Take 20 mg by mouth daily.   PROBIOTIC PO Take 1 capsule by mouth daily.   propranolol 10 MG tablet Commonly known as:  INDERAL Take 1 tablet by mouth daily. Take 1 tablet (10 mg) by mouth three times a day as needed for fast heart rates   tamsulosin 0.4 MG Caps capsule Commonly known as:  FLOMAX Take 2 capsules (0.8 mg total) by mouth daily after breakfast.       Allergies:  Allergies  Allergen Reactions  . Adhesive [Tape] Dermatitis    SKIN TURNS RED-PAPER TAPE OK TO USE  . Levaquin  [Levofloxacin]   . Macrodantin [Nitrofurantoin] Nausea And Vomiting  . Metronidazole Nausea Only  . Nitrofurantoin Macrocrystal Nausea And Vomiting  . Sulfamethoxazole-Trimethoprim Nausea Only  . Cephalexin Rash  . Clindamycin Hcl Rash  . Doxycycline Swelling and Rash  . Erythromycin Rash    Family History: Family History  Problem Relation Age of Onset  . Diabetes Mother   . Hearing loss Mother   . Hypertension Mother   . Heart disease Father   . Heart disease Paternal Grandfather   . Asthma Daughter    . Asthma Daughter   . Migraines Daughter   . Fibromyalgia Daughter   . Interstitial cystitis Daughter   . Asthma Daughter   . Colitis Daughter   . Thyroid cancer Daughter 30  . Diabetes Brother   . Hypertension Brother   . Breast cancer Neg Hx     Social History:  reports that she has never smoked. She has never used smokeless tobacco. She reports that she drank alcohol. She reports that she does not use drugs.  ROS: UROLOGY Frequent Urination?: Yes Hard to postpone urination?: Yes Burning/pain with urination?: No Get up at night to urinate?: Yes Leakage of urine?: Yes Urine stream starts and stops?: Yes Trouble starting stream?: No Do you have to strain to urinate?: No Blood in urine?: No Urinary tract infection?: No Sexually transmitted disease?: No Injury to kidneys or bladder?: No Painful intercourse?: No Weak stream?: No Currently pregnant?: No Vaginal bleeding?: No Last menstrual period?: n  Gastrointestinal Nausea?: No Vomiting?: No Indigestion/heartburn?: No Diarrhea?: No Constipation?: No  Constitutional Fever: No Night sweats?: No Weight loss?: No Fatigue?: No  Skin Skin rash/lesions?: No Itching?: No  Eyes Blurred vision?: No Double vision?: No  Ears/Nose/Throat Sore throat?: No Sinus problems?: No  Hematologic/Lymphatic Swollen glands?: No Easy bruising?: No  Cardiovascular Leg swelling?: No Chest pain?: No  Respiratory Cough?: No Shortness of breath?: No  Endocrine Excessive thirst?: No  Musculoskeletal Back pain?: No Joint pain?: No  Neurological Headaches?: No Dizziness?: No  Psychologic Depression?: No Anxiety?: No  Physical Exam: BP 101/62 (BP Location: Left Arm, Patient Position: Sitting, Cuff Size: Normal)   Pulse 97   Ht 5\' 6"  (1.676 m)   Wt 209 lb 1.6 oz (94.8 kg)   BMI 33.75 kg/m   Constitutional: Well nourished. Alert and oriented, No acute distress. HEENT: [place] AT, moist mucus membranes. Trachea  midline, no masses. Cardiovascular: No clubbing, cyanosis, or edema. Respiratory: Normal respiratory effort, no increased work of breathing. Skin: No rashes, bruises or suspicious lesions. Lymph: No cervical or inguinal adenopathy.  Neurologic: Grossly intact, no focal deficits, moving all 4 extremities. Psychiatric: Normal mood and affect. .  Laboratory Data: Lab Results  Component Value Date   WBC 7.0 08/24/2017   HGB 13.7 08/24/2017   HCT 40.8 08/24/2017   MCV 92.7 08/24/2017   PLT 300 08/24/2017    Lab Results  Component Value Date   CREATININE 0.87 08/24/2017    No results found for: PSA  No results found for: TESTOSTERONE  Lab Results  Component Value Date   HGBA1C 5.7 (H) 08/24/2017    Lab Results  Component Value Date   TSH 2.22 02/11/2017       Component Value Date/Time   CHOL 208 (H) 08/24/2017 0825   CHOL 209 (H) 06/04/2015 0753   CHOL 164 04/17/2011 0507   HDL 68 08/24/2017 0825   HDL 58 06/04/2015 0753   HDL 43 04/17/2011 0507   CHOLHDL 3.1 08/24/2017 0825   VLDL 15 01/05/2016 0828   VLDL 17 04/17/2011 0507   LDLCALC 124 (H) 08/24/2017 0825   LDLCALC 104 (H) 04/17/2011 0507    Lab Results  Component Value Date   AST 13 08/24/2017   Lab Results  Component Value Date   ALT 14 08/24/2017   No components found for: ALKALINEPHOPHATASE No components found for: BILIRUBINTOTAL  No results found for: ESTRADIOL  Urinalysis Moderate bacteria.  See Epic.   I have reviewed the labs.   Pertinent Imaging: Results for RENNIE, HACK (MRN [id number]) as of 10/12/2017 10:24  Ref. Range 10/12/2017 10:03  Scan Result Unknown 47    Assessment & Plan:    1. Mixed Incontinence Increased medical therapy to Toviaz 8 mg samples, #28 as Myrbetriq interacts with Flecainide  She will continue the tamsulosin at this time as she did have episodes of difficulty passing her urine on the Toviaz She will contact the office by my chart if she finds the  Toviaz 8 mg more effective She will discontinue the Toviaz if she starts to have symptoms of urinary retention I have given her Augmentin to have on hand in case the symptoms are due to urinary tract infection as she is leaving for [place] for the week due to a family medical emergency, I advised her not to take the antibiotic and less she started to experience dysuria, gross hematuria and/or fever/chills  Sending the urine for culture   2. Vaginal atrophy Patient not a candidate due to history of breast cancer for vaginal estrogen cream   Return for Patient to call for follow-up.  These notes generated with voice recognition software. I apologize for typographical errors.  Zara Council, PA-C  [facility] [address] [place], [place] [address] [phone]

## 2017-10-12 NOTE — Addendum Note (Signed)
Addended by: Kyra Manges on: 10/12/2017 11:28 AM   Modules accepted: Orders

## 2017-10-25 DIAGNOSIS — Z719 Counseling, unspecified: Secondary | ICD-10-CM | POA: Diagnosis not present

## 2017-11-01 DIAGNOSIS — Z719 Counseling, unspecified: Secondary | ICD-10-CM | POA: Diagnosis not present

## 2017-11-07 ENCOUNTER — Ambulatory Visit
Admission: RE | Admit: 2017-11-07 | Discharge: 2017-11-07 | Disposition: A | Discharge status: Home / Self Care | Payer: 59 | Source: Ambulatory Visit | Attending: Radiation Oncology | Admitting: Radiation Oncology

## 2017-11-07 ENCOUNTER — Other Ambulatory Visit: Payer: Self-pay

## 2017-11-07 ENCOUNTER — Encounter: Payer: Self-pay | Admitting: Radiation Oncology

## 2017-11-07 DIAGNOSIS — Z923 Personal history of irradiation: Secondary | ICD-10-CM | POA: Insufficient documentation

## 2017-11-07 DIAGNOSIS — C50412 Malignant neoplasm of upper-outer quadrant of left female breast: Secondary | ICD-10-CM

## 2017-11-07 DIAGNOSIS — Z86000 Personal history of in-situ neoplasm of breast: Secondary | ICD-10-CM | POA: Insufficient documentation

## 2017-11-07 DIAGNOSIS — Z170 Estrogen receptor positive status [ER+]: Secondary | ICD-10-CM

## 2017-11-07 NOTE — Progress Notes (Signed)
Radiation Oncology Follow up Note  Name: Sonya Lopez   Date:   11/07/2017 MRN:  [id number] DOB: [birth year]    This 62 y.o. female presents to the clinic today for to year follow-up status post accelerated partial breast radiation to her left breast for ductal carcinoma in situ ER/PR positive.  REFERRING PROVIDER: Steele Sizer, MD  HPI: patient is a 62 year old female now out 2 years having completed accelerated partial breast radiation ER/PR positive ductal carcinoma in situ seen today in routine follow-up she is doing well. She specifically denies breast tenderness cough or bone pain..she did mammogram which I have reviewed back in August which was BI-RADS 2 benign she is not on antiestrogen therapy I believe because of the side effect profile.  COMPLICATIONS OF TREATMENT: none  FOLLOW UP COMPLIANCE: keeps appointments   PHYSICAL EXAM:  BP (P) 123/68 (BP Location: Left Arm, Patient Position: Sitting)   Pulse (P) 76   Temp (P) 98 F (36.7 C) (Tympanic)   Wt (P) 207 lb 5.5 oz (94 kg)   BMI (P) 33.47 kg/m  Lungs are clear to A&P cardiac examination essentially unremarkable with regular rate and rhythm. No dominant mass or nodularity is noted in either breast in 2 positions examined. Incision is well-healed. No axillary or supraclavicular adenopathy is appreciated. Cosmetic result is excellent.Well-developed well-nourished patient in NAD. HEENT reveals PERLA, EOMI, discs not visualized.  Oral cavity is clear. No oral mucosal lesions are identified. Neck is clear without evidence of cervical or supraclavicular adenopathy. Lungs are clear to A&P. Cardiac examination is essentially unremarkable with regular rate and rhythm without murmur rub or thrill. Abdomen is benign with no organomegaly or masses noted. Motor sensory and DTR levels are equal and symmetric in the upper and lower extremities. Cranial nerves II through XII are grossly intact. Proprioception is intact. No peripheral  adenopathy or edema is identified. No motor or sensory levels are noted. Crude visual fields are within normal range.  RADIOLOGY RESULTS: mammograms reviewed compatible above-stated findings  PLAN: present time patient is doing well with no evidence of disease 2 years out from accelerated partial breast radiation. Her current mammograms were fine. She is not on antiestrogen therapy I believe because of the side effect profile. I will see the patient back in 1 year for follow-up. She knows to call sooner with any concerns.  I would like to take this opportunity to thank you for allowing me to participate in the care of your patient.Noreene Filbert, MD

## 2017-11-08 DIAGNOSIS — Z719 Counseling, unspecified: Secondary | ICD-10-CM | POA: Diagnosis not present

## 2017-11-14 ENCOUNTER — Telehealth: Payer: Self-pay | Admitting: Urology

## 2017-11-14 NOTE — Telephone Encounter (Signed)
Would you check with Sonya Lopez to see if the Toviaz 8 mg daily were more effective than the 4 mg daily?

## 2017-11-15 ENCOUNTER — Encounter: Payer: Self-pay | Admitting: Family Medicine

## 2017-11-15 DIAGNOSIS — Z719 Counseling, unspecified: Secondary | ICD-10-CM | POA: Diagnosis not present

## 2017-11-15 NOTE — Telephone Encounter (Signed)
Spoke to patient and she had to stop the medication, it was making her feel awful. She stopped the medication last week and is doing much better now. She states she would prefer to just stay on the Flomax. She does not have side effects on that medication.

## 2017-11-22 DIAGNOSIS — Z719 Counseling, unspecified: Secondary | ICD-10-CM | POA: Diagnosis not present

## 2017-11-25 DIAGNOSIS — Z719 Counseling, unspecified: Secondary | ICD-10-CM | POA: Diagnosis not present

## 2017-11-29 DIAGNOSIS — Z719 Counseling, unspecified: Secondary | ICD-10-CM | POA: Diagnosis not present

## 2017-12-31 ENCOUNTER — Encounter: Payer: Self-pay | Admitting: Cardiovascular Disease

## 2018-01-03 ENCOUNTER — Telehealth: Payer: 59 | Admitting: Physician Assistant

## 2018-01-03 DIAGNOSIS — J019 Acute sinusitis, unspecified: Secondary | ICD-10-CM

## 2018-01-03 DIAGNOSIS — B9689 Other specified bacterial agents as the cause of diseases classified elsewhere: Secondary | ICD-10-CM

## 2018-01-03 MED ORDER — AMOXICILLIN-POT CLAVULANATE 875-125 MG PO TABS: 1.0000 | ORAL_TABLET | Freq: Two times a day (BID) | ORAL | 0 refills | Status: AC

## 2018-01-03 NOTE — Progress Notes (Signed)
We are sorry that you are not feeling well.  Here is how we plan to help!  Based on what you have shared with me it looks like you have sinusitis.  Sinusitis is inflammation and infection in the sinus cavities of the head.  Based on your presentation I believe you most likely have Acute Bacterial Sinusitis.  This is an infection caused by bacteria and is treated with antibiotics. I have prescribed Augmentin 875mg /125mg  one tablet twice daily with food, for 7 days. You may use an oral decongestant such as Mucinex D or if you have glaucoma or high blood pressure use plain Mucinex. Saline nasal spray help and can safely be used as often as needed for congestion.  If you develop worsening sinus pain, fever or notice severe headache and vision changes, or if symptoms are not better after completion of antibiotic, please schedule an appointment with a health care provider.    Sinus infections are not as easily transmitted as other respiratory infection, however we still recommend that you avoid close contact with loved ones, especially the very young and elderly.  Remember to wash your hands thoroughly throughout the day as this is the number one way to prevent the spread of infection!  Home Care: Only take medications as instructed by your medical team. Complete the entire course of an antibiotic. Do not take these medications with alcohol. A steam or ultrasonic humidifier can help congestion.  You can place a towel over your head and breathe in the steam from hot water coming from a faucet. Avoid close contacts especially the very young and the elderly. Cover your mouth when you cough or sneeze. Always remember to wash your hands.  Get Help Right Away If: You develop worsening fever or sinus pain. You develop a severe head ache or visual changes. Your symptoms persist after you have completed your treatment plan.  Make sure you Understand these instructions. Will watch your condition. Will get  help right away if you are not doing well or get worse.  Your e-visit answers were reviewed by a board certified advanced clinical practitioner to complete your personal care plan.  Depending on the condition, your plan could have included both over the counter or prescription medications.  If there is a problem please reply once you have received a response from your provider.  Your safety is important to [name].  If you have drug allergies check your prescription carefully.    You can use MyChart to ask questions about today's visit, request a non-urgent call back, or ask for a work or school excuse for 24 hours related to this e-Visit. If it has been greater than 24 hours you will need to follow up with your provider, or enter a new e-Visit to address those concerns.  You will get an e-mail in the next two days asking about your experience.  I hope that your e-visit has been valuable and will speed your recovery. Thank you for using e-visits.    ===View-only below this line===   ----- Message -----    From: Sonya Lopez    Sent: 01/03/2018 10:46 AM EST      To: E-Visit Mailing List Subject: E-Visit Submission: Sinus Problems  E-Visit Submission: Sinus Problems --------------------------------  Question: Which of the following have you been experiencing? Answer:   Pain around the nose and face            Neck pain  Throat pain            Cough  Question: Have these symptoms significantly worsened over the last two to three days? Answer:   Yes  Question: Describe your sore throat: Answer:   This is slight and seems to be in accordance with drainage.  Question: How long have you had a sore throat? Answer:   2 days  Question: Do you have any tenderness or swelling in your neck? Answer:   No  Question: Have you had any of the following? Answer:   None of the above  Question: How long have you been having these symptoms? Answer:   5 days  Question: Do you  have a fever? Answer:   No, I do not have a fever  Question: Do you smoke? Answer:   No  Question: Have you ever smoked? Answer:   I have never smoked  Question: Do you have any chronic illnesses, such as diabetes, heart disease, or lung disease, or any illness that would weaken your body's ability to fight infection? Answer:   No  Question: When you blow your nose, what color is the mucus? Answer:   Mostly thin and yellow or green  Question: Have you experienced similar problems in the past? Answer:   Yes  Question: What treatments have worked in the past?  Answer:   Augmentin has worked in the past.  Question: What treatment(s) in the past have been unsuccessful? Answer:   unknown  Question: Is this illness similar to previous illnesses you have had?  How is it the same?  How is it different? Answer:   It is the same as previous sinus infections in that the left side of my face hurts as a 7 of 10 scale.  I have a cough which seems mostly dry and the mucous won't seem to break up.  It is a hacking cough and I have some hoarseness.  Question: Have you recently been hospitalized? Answer:   No  Question: What medications are you currently taking for these symptoms? Answer:   Nose spray  Question: Please enter the names of any medications you are taking, or any other treatments you are trying. Answer:   I use Nasonex nasal spray 50 mcg for my symptom.  I also use Sambucus lozenges to help with cough/sore throat.  Question: Please list your medication allergies that you may have ? (If 'none' , please list as 'none') Answer:   Levaquin, macrodantin, metronidazole, sulfa, cephalexin, doxycycline, erythromycin clindamycin  Question: Please list any additional comments  Answer:

## 2018-01-28 ENCOUNTER — Encounter: Payer: Self-pay | Admitting: Cardiovascular Disease

## 2018-02-02 DIAGNOSIS — M25522 Pain in left elbow: Secondary | ICD-10-CM | POA: Diagnosis not present

## 2018-02-02 DIAGNOSIS — M25529 Pain in unspecified elbow: Secondary | ICD-10-CM | POA: Insufficient documentation

## 2018-02-08 ENCOUNTER — Telehealth: Payer: Self-pay

## 2018-02-08 NOTE — Telephone Encounter (Signed)
Patient's daughter came in for letter to excuse her from [employer] on Feb.12, 2020.

## 2018-02-16 ENCOUNTER — Encounter: Payer: Self-pay | Admitting: Family Medicine

## 2018-02-19 ENCOUNTER — Encounter: Payer: Self-pay | Admitting: Cardiovascular Disease

## 2018-02-22 ENCOUNTER — Encounter: Payer: Self-pay | Admitting: Cardiovascular Disease

## 2018-02-22 ENCOUNTER — Other Ambulatory Visit: Payer: Self-pay | Admitting: Family Medicine

## 2018-02-22 DIAGNOSIS — F320 Major depressive disorder, single episode, mild: Secondary | ICD-10-CM

## 2018-02-22 NOTE — Telephone Encounter (Signed)
Refill request for general medication: Cymbalta 60 mg  Last office visit: 08/30/2017  Last physical exam: 08/30/2017  Follow-ups on file. 03/02/2018

## 2018-02-28 ENCOUNTER — Other Ambulatory Visit: Payer: Self-pay | Admitting: Cardiovascular Disease

## 2018-02-28 NOTE — Telephone Encounter (Signed)
Please review for refill, Thanks !  

## 2018-03-02 ENCOUNTER — Ambulatory Visit: Payer: 59 | Admitting: Family Medicine

## 2018-03-02 ENCOUNTER — Encounter: Payer: Self-pay | Admitting: Family Medicine

## 2018-03-02 VITALS — BP 114/62 | HR 70 | Temp 97.7°F | Resp 16 | Ht 66.0 in | Wt 203.8 lb

## 2018-03-02 DIAGNOSIS — E538 Deficiency of other specified B group vitamins: Secondary | ICD-10-CM

## 2018-03-02 DIAGNOSIS — F320 Major depressive disorder, single episode, mild: Secondary | ICD-10-CM | POA: Diagnosis not present

## 2018-03-02 DIAGNOSIS — I4892 Unspecified atrial flutter: Secondary | ICD-10-CM

## 2018-03-02 DIAGNOSIS — E119 Type 2 diabetes mellitus without complications: Secondary | ICD-10-CM

## 2018-03-02 DIAGNOSIS — G43009 Migraine without aura, not intractable, without status migrainosus: Secondary | ICD-10-CM

## 2018-03-02 DIAGNOSIS — E1169 Type 2 diabetes mellitus with other specified complication: Secondary | ICD-10-CM

## 2018-03-02 DIAGNOSIS — E559 Vitamin D deficiency, unspecified: Secondary | ICD-10-CM

## 2018-03-02 DIAGNOSIS — N3946 Mixed incontinence: Secondary | ICD-10-CM

## 2018-03-02 DIAGNOSIS — K219 Gastro-esophageal reflux disease without esophagitis: Secondary | ICD-10-CM

## 2018-03-02 DIAGNOSIS — L299 Pruritus, unspecified: Secondary | ICD-10-CM

## 2018-03-02 DIAGNOSIS — M25551 Pain in right hip: Secondary | ICD-10-CM

## 2018-03-02 DIAGNOSIS — E785 Hyperlipidemia, unspecified: Secondary | ICD-10-CM | POA: Diagnosis not present

## 2018-03-02 DIAGNOSIS — M5410 Radiculopathy, site unspecified: Secondary | ICD-10-CM

## 2018-03-02 DIAGNOSIS — M25552 Pain in left hip: Secondary | ICD-10-CM

## 2018-03-02 DIAGNOSIS — J3089 Other allergic rhinitis: Secondary | ICD-10-CM

## 2018-03-02 DIAGNOSIS — M797 Fibromyalgia: Secondary | ICD-10-CM

## 2018-03-02 LAB — POCT GLYCOSYLATED HEMOGLOBIN (HGB A1C): HBA1C, POC (CONTROLLED DIABETIC RANGE): 5.5 % (ref 0.0–7.0)

## 2018-03-02 LAB — POCT UA - MICROALBUMIN: Microalbumin Ur, POC: NEGATIVE mg/L

## 2018-03-02 MED ORDER — TRIAMCINOLONE ACETONIDE 0.1 % EX CREA: 1.0000 "application " | TOPICAL_CREAM | Freq: Two times a day (BID) | CUTANEOUS | 0 refills | Status: DC

## 2018-03-02 MED ORDER — OMEPRAZOLE 20 MG PO CPDR: 20.0000 mg | DELAYED_RELEASE_CAPSULE | Freq: Every day | ORAL | 1 refills | Status: DC

## 2018-03-02 MED ORDER — GABAPENTIN 100 MG PO CAPS: 100.0000 mg | ORAL_CAPSULE | Freq: Two times a day (BID) | ORAL | 1 refills | Status: DC

## 2018-03-02 NOTE — Progress Notes (Signed)
Name: Sonya Lopez   MRN: [id number]    DOB: 07/22/1955   Date:03/02/2018       Progress Note  Subjective  Chief Complaint  Chief Complaint  Patient presents with  . Medication Refill    6 month F/U  . Depression  . Fibromyalgia  . Gastroesophageal Reflux  . Diabetes  . History of left breast cancer    HPI  FMS: She states that Cymbalta and Gabapentin seems to be helping with symptoms, she still has mental fogginess, and aches all over, pain level has been higher 6/10, worse during winter.   Bilateral hip pain: seen by [facility] and had PT , pain is getting worse again, she is going to see neurologist to get a work  Up done soon, taking low dose gabapentin but she wants to hold off for now. Pain goes from buttocks to left lower leg   Atrial Flutter/Afib: Episodes are stable with Amiodarone, but currently only taking Amiodarone prn and tolerating it well.She is has propranolol prn only when. HR up to 160's a few times over the past month. She still has associated dizziness, and because heart beat forceful it feels sore in her chest at the end of the episode. Not associated with SOB   Migraine Headaches: she had more intense episodes and 4 times this month, usually on left maxillary area and radiates to left forehead and down to left side of neck, Axert kicks in within 40 minutes but this last episode it took all day to resolve, sometimes associated with nausea, but no vomiting Taking Amerge prn and it seems to control symptoms. She never started on Topamax but since her daughter sees Dr. Jaynee Eagles, she would like to be referred to her  Mild Major Depression:she is still taking Duloxetine, phq 9 is stable, she denies crying spells, not sleeping well because of hip pain.   DMII: she is on diet only and hgbA1C is under control, urine micro is normal.She denies polyphagia, no polyuria or polydipsia. Eye exam is up to date, foot exam today . Recheck labs .  GERD: under  control with medication, she tries alternating Prilosec and Zantac.  History of left breast cancer: she had lumpectomy, could not tolerate estrogen suppressive therapy.mammogram is up to date.   Obesity: she states she has been eating healthier, except during holidays, she has lost 3 lbs since last visit 6 months ago. More vegetables and fruit   Pruritus on both heels: noticed last week, she stopped new body wash but still itches, slightly better hydrocortisone but did not last long, on right heel has a few bumps but clear on the left, intermittent episodes    Patient Active Problem List   Diagnosis Date Noted  . Pain, elbow joint 02/02/2018  . PAF (paroxysmal atrial fibrillation) ([place]) 09/13/2017  . Trochanteric bursitis of left hip 06/24/2017  . Low back pain 02/22/2017  . Benign breast cyst in female, right 05/25/2016  . History of left breast cancer 09/16/2015  . Long-term use of high-risk medication 02/25/2015  . Increased urinary frequency 08/01/2014  . Mild major depression ([place]) 07/29/2014  . Gastric reflux 07/29/2014  . Chronic interstitial cystitis 07/29/2014  . Calculus of kidney 07/29/2014  . Allergic rhinitis 07/29/2014  . Acne erythematosa 07/29/2014  . Lumbosacral radiculitis 07/29/2014  . Tinnitus 07/29/2014  . Mixed incontinence, urge and stress (female) (female) 08/23/2013  . Hyperlipidemia 04/25/2013  . H/O: hysterectomy 11/30/2011  . Atrial flutter ([place]) 05/03/2011  . Diet-controlled type  2 diabetes mellitus ([place]) 04/16/2011  . Bladder cystocele 01/05/2011  . Migraine without aura and without status migrainosus, not intractable 12/29/2010  . Fibromyalgia 12/29/2010  . Obesity 12/29/2010  . Migraine 12/29/2010    Past Surgical History:  Procedure Laterality Date  . ANTERIOR AND POSTERIOR REPAIR  11/30/2011   Procedure: ANTERIOR (CYSTOCELE) AND POSTERIOR REPAIR (RECTOCELE);  Surgeon: Reece Packer, MD;  Location: [facility];  Service: Urology;   Laterality: N/A;  with cysto  . BLADDER SUSPENSION  11/30/2011   Procedure: [facility] PROCEDURE;  Surgeon: Reece Packer, MD;  Location: [facility];  Service: Urology;  Laterality: N/A;  graft 10x6  . BREAST BIOPSY Right 2007   negative core  . BREAST BIOPSY Left 09/16/2015   DCIS  . BREAST LUMPECTOMY Left 09/30/2015   DCIS clear margins and rad tx/HIGH GRADE DUCTAL CARCINOMA IN SITU, COMEDO TYPE  . BREAST LUMPECTOMY WITH NEEDLE LOCALIZATION Left 09/30/2015   DCIS excision, MammoSite radiation;  Surgeon: Robert Bellow, MD;  Location: [facility];  Service: General;  Laterality: Left;  . CESAREAN SECTION     x 1  . COLONOSCOPY  2009  . CYSTOSCOPY  11/30/2011   Procedure: CYSTOSCOPY;  Surgeon: Reece Packer, MD;  Location: [facility];  Service: Urology;;  . Brigitte Pulse AND CURETTAGE OF UTERUS  05/05/2007  . GALLBLADDER SURGERY  2004  . TONSILLECTOMY  1961  . VAGINAL HYSTERECTOMY  11/30/2011   Procedure: HYSTERECTOMY VAGINAL;  Surgeon: Emily Filbert, MD;  Location: [facility];  Service: Gynecology;  Laterality: N/A;    Family History  Problem Relation Age of Onset  . Diabetes Mother   . Hearing loss Mother   . Hypertension Mother   . Heart disease Father   . Heart disease Paternal Grandfather   . Asthma Daughter   . Asthma Daughter   . Migraines Daughter   . Fibromyalgia Daughter   . Interstitial cystitis Daughter   . Asthma Daughter   . Colitis Daughter   . Thyroid cancer Daughter 46  . Diabetes Brother   . Hypertension Brother   . Breast cancer Neg Hx     Social History   Socioeconomic History  . Marital status: Married    Spouse name: Environmental consultant   . Number of children: 3  . Years of education: 16  . Highest education level: Bachelor's degree (e.g., BA, AB, BS)  Occupational History  . Not on file  Social Needs  . Financial resource strain: Not hard at all  . Food insecurity:    Worry: Never true    Inability: Never true  . Transportation needs:    Medical: No     Non-medical: No  Tobacco Use  . Smoking status: Never Smoker  . Smokeless tobacco: Never Used  Substance and Sexual Activity  . Alcohol use: Not Currently    Alcohol/week: 0.0 standard drinks    Frequency: Never    Comment: Due to AF  . Drug use: No  . Sexual activity: Yes    Partners: Male  Lifestyle  . Physical activity:    Days per week: 6 days    Minutes per session: 20 min  . Stress: To some extent  Relationships  . Social connections:    Talks on phone: More than three times a week    Gets together: More than three times a week    Attends religious service: More than 4 times per year    Active member of club or organization: Yes  Attends meetings of clubs or organizations: More than 4 times per year    Relationship status: Married  . Intimate partner violence:    Fear of current or ex partner: No    Emotionally abused: No    Physically abused: No    Forced sexual activity: No  Other Topics Concern  . Not on file  Social History Narrative  . Not on file     Current Outpatient Medications:  .  Clindamycin Phosphate foam, Apply 1 Units topically as needed. For rosacea  , Disp: , Rfl: 5 .  DULoxetine (CYMBALTA) 60 MG capsule, TAKE 1 CAPSULE BY MOUTH ONCE DAILY, Disp: 90 capsule, Rfl: 1 .  ELIQUIS 5 MG TABS tablet, TAKE 1 TABLET BY MOUTH TWICE A DAY, Disp: 60 tablet, Rfl: 6 .  FINACEA 15 % FOAM, , Disp: , Rfl:  .  flecainide (TAMBOCOR) 50 MG tablet, Take 1 tablet (50 mg total) by mouth 2 (two) times daily., Disp: 60 tablet, Rfl: 6 .  gabapentin (NEURONTIN) 100 MG capsule, Take 1 capsule (100 mg total) by mouth 2 (two) times daily., Disp: 180 capsule, Rfl: 1 .  levocetirizine (XYZAL) 5 MG tablet, Take 1 tablet (5 mg total) by mouth every evening. (Patient taking differently: Take 5 mg by mouth as needed. ), Disp: 30 tablet, Rfl: 0 .  magnesium oxide (MAG-OX) 400 MG tablet, Take 400 mg by mouth every morning. , Disp: , Rfl:  .  meloxicam (MOBIC) 15 MG tablet, Take 15 mg  by mouth as needed. , Disp: , Rfl:  .  mometasone (NASONEX) 50 MCG/ACT nasal spray, Place 2 sprays into the nose every morning. , Disp: , Rfl:  .  naratriptan (AMERGE) 2.5 MG tablet, TAKE 1 TABLET AT ONSET OF HEADACHE MAY REPEAT AFTER 4 HOURS IF RETURNS OR DOES NOT RESOLVE MAX OF 2 TABS PER DAY, Disp: 9 tablet, Rfl: 5 .  omeprazole (PRILOSEC) 20 MG capsule, Take 1 capsule (20 mg total) by mouth daily., Disp: 90 capsule, Rfl: 1 .  Probiotic Product (PROBIOTIC PO), Take 1 capsule by mouth daily. , Disp: , Rfl:  .  propranolol (INDERAL) 10 MG tablet, Take 1 tablet by mouth daily. Take 1 tablet (10 mg) by mouth three times a day as needed for fast heart rates , Disp: , Rfl:  .  tamsulosin (FLOMAX) 0.4 MG CAPS capsule, Take 2 capsules (0.8 mg total) by mouth daily after breakfast., Disp: 90 capsule, Rfl: 3  Allergies  Allergen Reactions  . Adhesive [Tape] Dermatitis    SKIN TURNS RED-PAPER TAPE OK TO USE  . Levaquin  [Levofloxacin]   . Macrodantin [Nitrofurantoin] Nausea And Vomiting  . Metronidazole Nausea Only  . Nitrofurantoin Macrocrystal Nausea And Vomiting  . Sulfamethoxazole-Trimethoprim Nausea Only  . Cephalexin Rash  . Clindamycin Hcl Rash  . Doxycycline Swelling and Rash  . Erythromycin Rash    I personally reviewed active problem list, medication list, allergies, family history, social history with the patient/caregiver today.   ROS  Constitutional: Negative for fever or weight change.  Respiratory: Negative for cough and shortness of breath.   Cardiovascular: Negative for chest pain or palpitations.  Gastrointestinal: Negative for abdominal pain, no bowel changes.  Musculoskeletal: Negative for gait problem or joint swelling.  Skin: Negative for rash.  Neurological: Negative for dizziness or headache.  No other specific complaints in a complete review of systems (except as listed in HPI above).  Objective  Vitals:   03/02/18 0908  BP: 114/62  Pulse:  70  Resp: 16   Temp: 97.7 F (36.5 C)  TempSrc: Oral  SpO2: 97%  Weight: 203 lb 12.8 oz (92.4 kg)  Height: 5\' 6"  (1.676 m)    Body mass index is 32.89 kg/m.  Physical Exam  Constitutional: Patient appears well-developed and well-nourished. Obese  No distress.  HEENT: head atraumatic, normocephalic, pupils equal and reactive to light, neck supple, throat within normal limits Cardiovascular: Normal rate, regular rhythm and normal heart sounds.  No murmur heard. No BLE edema. Muscular skeletal: pain during palpation of left lower back, negative straight leg raise, trigger point positive  Pulmonary/Chest: Effort normal and breath sounds normal. No respiratory distress. Abdominal: Soft.  There is no tenderness. Psychiatric: Patient has a normal mood and affect. behavior is normal. Judgment and thought content normal.  Recent Results (from the past 2160 hour(s))  POCT UA - Microalbumin     Status: Normal   Collection Time: 03/02/18  9:21 AM  Result Value Ref Range   Microalbumin Ur, POC negative mg/L   Creatinine, POC     Albumin/Creatinine Ratio, Urine, POC    POCT HgB A1C     Status: Normal   Collection Time: 03/02/18  9:23 AM  Result Value Ref Range   Hemoglobin A1C     HbA1c POC (<> result, manual entry)     HbA1c, POC (prediabetic range)     HbA1c, POC (controlled diabetic range) 5.5 0.0 - 7.0 %    Diabetic Foot Exam: Diabetic Foot Exam - Simple   Simple Foot Form Diabetic Foot exam was performed with the following findings:  Yes 03/02/2018  9:43 AM  Visual Inspection See comments:  Yes Sensation Testing Intact to touch and monofilament testing bilaterally:  Yes Pulse Check Posterior Tibialis and Dorsalis pulse intact bilaterally:  Yes Comments Spur on top of 3rd toe      PHQ2/9: Depression screen [facility] 2/9 03/02/2018 08/30/2017 08/10/2017 11/03/2016 04/28/2016  Decreased Interest 0 0 0 0 0  Down, Depressed, Hopeless 0 1 1 0 0  PHQ - 2 Score 0 1 1 0 0  Altered sleeping 3 0 1 - -   Tired, decreased energy 1 1 1  - -  Change in appetite 0 1 1 - -  Feeling bad or failure about yourself  0 0 1 - -  Trouble concentrating 0 0 0 - -  Moving slowly or fidgety/restless 0 0 0 - -  Suicidal thoughts 0 0 0 - -  PHQ-9 Score 4 3 5  - -  Difficult doing work/chores Not difficult at all Not difficult at all Somewhat difficult - -     Fall Risk: Fall Risk  03/02/2018 08/30/2017 08/10/2017 02/10/2017 11/03/2016  Falls in the past year? 1 No No No No  Number falls in past yr: 0 - - - -  Injury with Fall? 1 - - - -  Comment Bilateral Knees and left arm - - - -      Functional Status Survey: Is the patient deaf or have difficulty hearing?: No Does the patient have difficulty seeing, even when wearing glasses/contacts?: Yes(reading glasses) Does the patient have difficulty concentrating, remembering, or making decisions?: No Does the patient have difficulty walking or climbing stairs?: No Does the patient have difficulty dressing or bathing?: No Does the patient have difficulty doing errands alone such as visiting a doctor's office or shopping?: No    Assessment & Plan  1. Diet-controlled type 2 diabetes mellitus (H[facility])  - POCT HgB A1C -  POCT UA - Microalbumin  2. Mild major depression ([place])   3. Atrial flutter, unspecified type ([place])  Under the care of Dr. Rockey Situ   4. Mixed incontinence, urge and stress (female) (female)  Taking medication   5. Perennial allergic rhinitis   6. Vitamin D deficiency   7. Fibromyalgia  - gabapentin (NEURONTIN) 100 MG capsule; Take 1 capsule (100 mg total) by mouth 2 (two) times daily.  Dispense: 180 capsule; Refill: 1  8. B12 deficiency  Continue supplementation   9. Dyslipidemia associated with type 2 diabetes mellitus ([place])  She is on statin therapy   10. Migraine without aura and without status migrainosus, not intractable  Stable   11. Pain of both hip joints  Keep follow up with Ortho  12. Radicular pain of left  lower extremity   13. GERD without esophagitis  - omeprazole (PRILOSEC) 20 MG capsule; Take 1 capsule (20 mg total) by mouth daily.  Dispense: 90 capsule; Refill: 1  14. Itching with irritation  - triamcinolone cream (KENALOG) 0.1 %; Apply 1 application topically 2 (two) times daily.  Dispense: 30 g; Refill: 0  She has appointment with Dermatologist March 2020

## 2018-03-03 LAB — LIPID PANEL
CHOL/HDL RATIO: 3.1 (calc) (ref <5.0)
Cholesterol: 186 mg/dL (ref <200)
HDL: 60 mg/dL (ref >50)
LDL Cholesterol (Calc): 108 mg/dL (calc) — ABNORMAL HIGH
Non-HDL Cholesterol (Calc): 126 mg/dL (calc) (ref <130)
Triglycerides: 87 mg/dL (ref <150)

## 2018-03-03 LAB — COMPLETE METABOLIC PANEL WITH GFR
AG Ratio: 1.8 (calc) (ref 1.0–2.5)
ALT: 16 U/L (ref 6–29)
AST: 12 U/L (ref 10–35)
Albumin: 4.2 g/dL (ref 3.6–5.1)
Alkaline phosphatase (APISO): 144 U/L — ABNORMAL HIGH (ref 33–130)
BUN: 20 mg/dL (ref 7–25)
CALCIUM: 9.7 mg/dL (ref 8.6–10.4)
CO2: 30 mmol/L (ref 20–32)
Chloride: 105 mmol/L (ref 98–110)
Creat: 0.86 mg/dL (ref 0.50–0.99)
GFR, EST NON AFRICAN AMERICAN: 71 mL/min/{1.73_m2} (ref >=60)
GFR, Est African American: 83 mL/min/{1.73_m2} (ref >=60)
Globulin: 2.4 g/dL (calc) (ref 1.9–3.7)
Glucose, Bld: 84 mg/dL (ref 65–99)
Potassium: 4.8 mmol/L (ref 3.5–5.3)
Sodium: 141 mmol/L (ref 135–146)
Total Bilirubin: 0.3 mg/dL (ref 0.2–1.2)
Total Protein: 6.6 g/dL (ref 6.1–8.1)

## 2018-03-06 ENCOUNTER — Encounter: Payer: Self-pay | Admitting: Cardiovascular Disease

## 2018-03-06 NOTE — Progress Notes (Signed)
Cardiology Office Note  Date:  03/13/2018   ID:  Sonya Lopez, DOB 04-Feb-1955, MRN [id number]  PCP:  Steele Sizer, MD   Chief Complaint  Patient presents with  . other    6 mo follow up.Elevated HR and chest pressure, resoloves with Flecainide and rest.Medications reviewed verbally.     HPI:  Sonya Lopez is a very pleasant 63 y.o. woman with a PMHx of:  Diabetes  GERD hospital admission in 04/16/2011 for atrial flutter with RVR.  CT coronary calcium score zero 09/2015 Father had significant history of coronary artery disease but was a heavy smoker She presents for routine followup of her atrial fibrillation and flutter  Unable to tolerate amiodarone in the past, she had tremor takes propranolol as needed for breakthrough atrial fibrillation/flutter She monitors heart rate using  Alive Core Reports having continued episodes of paroxysmal H for ablation flutter lasting sometimes several hours at a time  On several episodes had lightheadedness and leg swelling  INTERVAL HISTORY: The patient reports to clinic today for 6 month EKG follow up. She is doing well overall, but endorses swelling to her bilateral ankles. Adding it is worse at night.   She reports about 4 times a month she is irregular, in atrial fibrillation. During those times she experiences occasional chest tightness.  Below are three times she was irregular that was tracked by her apple watch.  03/06/2018 up to 150 beats per minute.  02/19/2018  01/28/2018 up to 139 When she undergoes an episode she will take 50 mg of flecainide, extra an effort to convert back to normal sinus rhythm  Lab work reviewed with her Total chol 186, LDL 108  EKG personally reviewed by myself on todays visit shows normal sinus rhythm, possible left atrial enlargement, otherwise normal  OTHER PAST MEDICAL HISTORY REVIEWED BY ME FOR TODAY'S VISIT: 2019 09/13/2017 EKG showed normal sinus rhythm rate 58 bpm no significant ST or  T-wave changes Total chol 177, LDL 105 Hemoglobin A1C in the 5 range  Documented Afib: 07/27/2017, atrial fib, lasted hours.  08/10/17 arrhythmia per patinet, 2-3 hours 08/14/2017 08/17/2017  2016 Documented Afib: 10/08/2014, 10/11/2014, 10/21/2014, 10/28/2014, 11/01/2014, 11/27/2014, 12/08/2014, 12/20/2014, 12/27/2014, 01/26/2015 Typically episodes will last 1.5 up to 2 hours Heart rate typically runs 110, sometimes 155 bpm, one episode 170 bpm Relatively symptomatic when she has episodes She does report significant stress in the family, particularly over the summer 2016 She was started on Cymbalta with improvement of her symptoms. Feels less stressed  Previous episodes of atrial flutter in November 2015, January 2016 typically lasting less than 2 hours She takes care of her mother who has significant medical issues. Significant stress at home.  2015 She does report having some chest pain in February and early March 2015 which seemed to resolve by starting a proton pump inhibitor. No further chest discomfort with biking or other exercise.  Previous  Echocardiogram is essentially normal with normal systolic function, ejection fraction greater than 55%, no significant valvular disease for wall motion abnormality. Total cholesterol 196, LDL 121, HDL 59. Cholesterol is up from 160s since then she has been taking great  PMH:   has a past medical history of Allergy, Anxiety, Arrhythmia, Atrial flutter ([place]), Breast neoplasm, Tis (DCIS), left (10/2015), Cancer ([place]) (2017), Depression, Diet-controlled type 2 diabetes mellitus ([place]), Difficult intubation (2009), Female cystocele (01/05/2011), Fibromyalgia, GERD (gastroesophageal reflux disease), Headache(784.0), Kidney stones, Obesity, Personal history of radiation therapy (2017), and PONV (postoperative nausea and vomiting).  PSH:  Past Surgical History:  Procedure Laterality Date  . ABDOMINAL HYSTERECTOMY  2013  . ANTERIOR AND POSTERIOR REPAIR   11/30/2011   Procedure: ANTERIOR (CYSTOCELE) AND POSTERIOR REPAIR (RECTOCELE);  Surgeon: Reece Packer, MD;  Location: [facility];  Service: Urology;  Laterality: N/A;  with cysto  . BLADDER SUSPENSION  11/30/2011   Procedure: [facility] PROCEDURE;  Surgeon: Reece Packer, MD;  Location: [facility];  Service: Urology;  Laterality: N/A;  graft 10x6  . BREAST BIOPSY Right 2007   negative core  . BREAST BIOPSY Left 09/16/2015   DCIS  . BREAST LUMPECTOMY Left 09/30/2015   DCIS clear margins and rad tx/HIGH GRADE DUCTAL CARCINOMA IN SITU, COMEDO TYPE  . BREAST LUMPECTOMY WITH NEEDLE LOCALIZATION Left 09/30/2015   DCIS excision, MammoSite radiation;  Surgeon: Robert Bellow, MD;  Location: [facility];  Service: General;  Laterality: Left;  . CESAREAN SECTION     x 1  . CHOLECYSTECTOMY  2005  . COLONOSCOPY  2009  . CYSTOSCOPY  11/30/2011   Procedure: CYSTOSCOPY;  Surgeon: Reece Packer, MD;  Location: [facility];  Service: Urology;;  . Brigitte Pulse AND CURETTAGE OF UTERUS  05/05/2007  . GALLBLADDER SURGERY  2004  . TONSILLECTOMY  1961  . VAGINAL HYSTERECTOMY  11/30/2011   Procedure: HYSTERECTOMY VAGINAL;  Surgeon: Emily Filbert, MD;  Location: [facility];  Service: Gynecology;  Laterality: N/A;    Current Outpatient Medications  Medication Sig Dispense Refill  . Clindamycin Phosphate foam Apply 1 Units topically as needed. For rosacea    5  . DULoxetine (CYMBALTA) 60 MG capsule TAKE 1 CAPSULE BY MOUTH ONCE DAILY 90 capsule 1  . ELIQUIS 5 MG TABS tablet TAKE 1 TABLET BY MOUTH TWICE A DAY 60 tablet 6  . FINACEA 15 % FOAM     . flecainide (TAMBOCOR) 50 MG tablet Take 1 tablet (50 mg total) by mouth 2 (two) times daily. 60 tablet 6  . gabapentin (NEURONTIN) 100 MG capsule Take 1 capsule (100 mg total) by mouth 2 (two) times daily. 180 capsule 1  . levocetirizine (XYZAL) 5 MG tablet Take 1 tablet (5 mg total) by mouth every evening. (Patient taking differently: Take 5 mg by mouth as needed. ) 30 tablet 0  .  magnesium oxide (MAG-OX) 400 MG tablet Take 400 mg by mouth every morning.     . meloxicam (MOBIC) 15 MG tablet Take 15 mg by mouth as needed.     . mometasone (NASONEX) 50 MCG/ACT nasal spray Place 2 sprays into the nose every morning.     . naratriptan (AMERGE) 2.5 MG tablet TAKE 1 TABLET AT ONSET OF HEADACHE MAY REPEAT AFTER 4 HOURS IF RETURNS OR DOES NOT RESOLVE MAX OF 2 TABS PER DAY 9 tablet 5  . omeprazole (PRILOSEC) 20 MG capsule Take 1 capsule (20 mg total) by mouth daily. 90 capsule 1  . Probiotic Product (PROBIOTIC PO) Take 1 capsule by mouth daily.     . propranolol (INDERAL) 10 MG tablet Take 1 tablet by mouth daily. Take 1 tablet (10 mg) by mouth three times a day as needed for fast heart rates     . tamsulosin (FLOMAX) 0.4 MG CAPS capsule Take 2 capsules (0.8 mg total) by mouth daily after breakfast. 90 capsule 3  . triamcinolone cream (KENALOG) 0.1 % Apply 1 application topically 2 (two) times daily. 30 g 0   No current facility-administered medications for this visit.      Allergies:  Adhesive [tape]; Levaquin  [levofloxacin]; Macrodantin [nitrofurantoin]; Metronidazole; Nitrofurantoin macrocrystal; Sulfamethoxazole-trimethoprim; Cephalexin; Clindamycin hcl; Doxycycline; and Erythromycin   Social History:  The patient  reports that she has never smoked. She has never used smokeless tobacco. She reports previous alcohol use. She reports that she does not use drugs.   Family History:   family history includes Asthma in her daughter, daughter, and daughter; Colitis in her daughter; Diabetes in her brother and mother; Fibromyalgia in her daughter; Hearing loss in her mother; Heart disease in her father and paternal grandfather; Hypertension in her brother and mother; Interstitial cystitis in her daughter; Migraines in her daughter; Thyroid cancer (age of onset: 10) in her daughter.    Review of Systems: Review of Systems  Constitutional: Negative.   Respiratory: Negative.    Cardiovascular: Positive for palpitations.       Occasional chest tightness in the setting of tachycardia  Gastrointestinal: Negative.   Musculoskeletal: Negative.   Neurological: Negative.   Psychiatric/Behavioral: Negative.   All other systems reviewed and are negative.    PHYSICAL EXAM: VS:  BP 130/70 (BP Location: Left Arm, Patient Position: Sitting, Cuff Size: Normal)   Pulse 63   Ht 5\' 6"  (1.676 m)   Wt 205 lb (93 kg)   BMI 33.09 kg/m  , BMI Body mass index is 33.09 kg/m.  Constitutional:  oriented to person, place, and time. No distress.  HENT:  Head: Grossly normal Eyes:  no discharge. No scleral icterus.  Neck: No JVD, no carotid bruits  Cardiovascular: Regular rate and rhythm, no murmurs appreciated Pulmonary/Chest: Clear to auscultation bilaterally, no wheezes or rails Abdominal: Soft.  no distension.  no tenderness.  Musculoskeletal: Normal range of motion Neurological:  normal muscle tone. Coordination normal. No atrophy Skin: Skin warm and dry Psychiatric: normal affect, pleasant   Recent Labs: 08/24/2017: Hemoglobin 13.7; Platelets 300 03/02/2018: ALT 16; BUN 20; Creat 0.86; Potassium 4.8; Sodium 141    Lipid Panel Lab Results  Component Value Date   CHOL 186 03/02/2018   HDL 60 03/02/2018   LDLCALC 108 (H) 03/02/2018   TRIG 87 03/02/2018      Wt Readings from Last 3 Encounters:  03/13/18 205 lb (93 kg)  03/13/18 205 lb (93 kg)  03/02/18 203 lb 12.8 oz (92.4 kg)     ASSESSMENT AND PLAN:  Atrial fibrillation/ flutter, unspecified type ([place]) Plan: EKG 12-Lead 4 episodes of atrial fibrillation on average every month Secondary to high arrhythmia burden we will make some medication changes as detailed below Increase flecainide to 100mg  BID Recommend she start taking metoprolol 12.5 daily For breakthrough take flecainide 50 mg and propanolol 10 mg  Compression socks for edema If she continues to have breakthrough atrial fibrillation recommended  she call the office  Hyperlipidemia Plan: Total cholesterol increased to 186.  She is trying to watch her weight, walking program   Disposition:   F/U  6 months  Total encounter time more than 25 minutes Greater than 50% was spent in counseling and coordination of care with the patient  Orders Placed This Encounter  Procedures  . EKG 12-Lead   I, Margit Banda am acting as a scribe for Ida Rogue, M.D., Ph.D.  I have reviewed the above documentation for accuracy and completeness, and I agree with the above.   Signed, Esmond Plants, M.D., Ph.D. 03/13/2018  [place], [place]

## 2018-03-12 NOTE — Patient Instructions (Addendum)
Refills on eliquis and propranolol  Medication Instructions: - Your physician has recommended you make the following change in your medication:    1) Please increase the flecainide up to 100 mg- take one tablet by mouth twice a day  2) Start toprol (metoprolol succinate)25 mg- take 1/2 tablet (12.5 mg) by mouth once daily  3) For breakthrough atrial fibrillation: - Take flecainide 50 mg once daily as needed and propranolol 10 mg once daily as needed   If you need a refill on your cardiac medications before your next appointment, please call your pharmacy.    Lab work: No new labs needed   If you have labs (blood work) drawn today and your tests are completely normal, you will receive your results only by:  [place] (if you have MyChart) OR  A paper copy in the mail If you have any lab test that is abnormal or we need to change your treatment, we will call you to review the results.   Testing/Procedures: No new testing needed   Follow-Up: At [facility], you and your health needs are our priority.  As part of our continuing mission to provide you with exceptional heart care, we have created designated Provider Care Teams.  These Care Teams include your primary Cardiologist (physician) and Advanced Practice Providers (APPs -  Physician Assistants and Nurse Practitioners) who all work together to provide you with the care you need, when you need it.   You will need a follow up appointment in 6 months    Please call our office 2 months in advance to schedule this appointment.     Providers on your designated Care Team:    Murray Hodgkins, NP  Christell Faith, PA-C  Marrianne Mood, PA-C  Any Other Special Instructions Will Be Listed Below (If Applicable).  For educational health videos Log in to : [email or web address] Or : [email or web address], password : [email or web address]

## 2018-03-13 ENCOUNTER — Encounter: Payer: Self-pay | Admitting: Cardiovascular Disease

## 2018-03-13 ENCOUNTER — Ambulatory Visit (INDEPENDENT_AMBULATORY_CARE_PROVIDER_SITE_OTHER): Payer: Medicare Other | Admitting: Family Medicine

## 2018-03-13 ENCOUNTER — Encounter: Payer: Self-pay | Admitting: Family Medicine

## 2018-03-13 ENCOUNTER — Other Ambulatory Visit: Payer: Self-pay

## 2018-03-13 ENCOUNTER — Ambulatory Visit (INDEPENDENT_AMBULATORY_CARE_PROVIDER_SITE_OTHER): Payer: Medicare Other | Admitting: Cardiovascular Disease

## 2018-03-13 VITALS — BP 130/70 | HR 63 | Ht 66.0 in | Wt 205.0 lb

## 2018-03-13 VITALS — BP 116/70 | HR 79 | Temp 98.0°F | Resp 16 | Ht 66.0 in | Wt 205.0 lb

## 2018-03-13 DIAGNOSIS — Z23 Encounter for immunization: Secondary | ICD-10-CM

## 2018-03-13 DIAGNOSIS — E2839 Other primary ovarian failure: Secondary | ICD-10-CM | POA: Diagnosis not present

## 2018-03-13 DIAGNOSIS — Z0000 Encounter for general adult medical examination without abnormal findings: Secondary | ICD-10-CM

## 2018-03-13 DIAGNOSIS — I483 Typical atrial flutter: Secondary | ICD-10-CM | POA: Diagnosis not present

## 2018-03-13 DIAGNOSIS — Z1231 Encounter for screening mammogram for malignant neoplasm of breast: Secondary | ICD-10-CM

## 2018-03-13 DIAGNOSIS — E782 Mixed hyperlipidemia: Secondary | ICD-10-CM | POA: Diagnosis not present

## 2018-03-13 DIAGNOSIS — I480 Paroxysmal atrial fibrillation: Secondary | ICD-10-CM | POA: Diagnosis not present

## 2018-03-13 DIAGNOSIS — E119 Type 2 diabetes mellitus without complications: Secondary | ICD-10-CM | POA: Diagnosis not present

## 2018-03-13 DIAGNOSIS — R229 Localized swelling, mass and lump, unspecified: Secondary | ICD-10-CM | POA: Diagnosis not present

## 2018-03-13 DIAGNOSIS — M542 Cervicalgia: Secondary | ICD-10-CM | POA: Diagnosis not present

## 2018-03-13 MED ORDER — PROPRANOLOL HCL 10 MG PO TABS: ORAL_TABLET | ORAL | 3 refills | Status: DC

## 2018-03-13 MED ORDER — APIXABAN 5 MG PO TABS: 5.0000 mg | ORAL_TABLET | Freq: Two times a day (BID) | ORAL | 3 refills | Status: DC

## 2018-03-13 MED ORDER — FLECAINIDE ACETATE 100 MG PO TABS: 100.0000 mg | ORAL_TABLET | Freq: Two times a day (BID) | ORAL | 3 refills | Status: DC

## 2018-03-13 MED ORDER — FLECAINIDE ACETATE 50 MG PO TABS: ORAL_TABLET | ORAL | 6 refills | Status: DC

## 2018-03-13 MED ORDER — METOPROLOL SUCCINATE ER 25 MG PO TB24: 12.5000 mg | ORAL_TABLET | Freq: Every day | ORAL | 3 refills | Status: DC

## 2018-03-13 NOTE — Patient Instructions (Signed)
  Sonya Lopez , Thank you for taking time to come for your Medicare Wellness Visit. I appreciate your ongoing commitment to your health goals. Please review the following plan we discussed and let me know if I can assist you in the future.    This is a list of the screening recommended for you and due dates:  Health Maintenance  Topic Date Due  . Eye exam for diabetics  02/02/2018  . Pneumonia vaccines (2 of 2 - PPSV23) 03/07/2018  . HIV Screening  03/03/2019*  . Hemoglobin A1C  08/31/2018  . Complete foot exam   03/03/2019  . Urine Protein Check  03/03/2019  . Mammogram  09/22/2019  . Tetanus Vaccine  02/11/2027  . Colon Cancer Screening  03/04/2027  . Flu Shot  Completed  . DEXA scan (bone density measurement)  Completed  .  Hepatitis C: One time screening is recommended by Center for Disease Control  (CDC) for  adults born from [birth year] through 1967.   Completed  *Topic was postponed. The date shown is not the original due date.

## 2018-03-13 NOTE — Progress Notes (Signed)
Patient: Sonya Lopez, Female    DOB: April 11, 1955, 63 y.o.   MRN: [id number]  Visit Date: 03/13/2018  Today's Provider: Loistine Chance, MD   Chief Complaint  Patient presents with  . Medicare Wellness    Welcome to Medicare    Subjective:    HPI Sonya Lopez is a 63 y.o. female who presents today for her Subsequent Annual Wellness Visit.  Patient/Caregiver input:  Pain on left arm  Left arm lump: she noticed pain on left lateral arm for the past 2 months, feels some lumps in the area, no redness but is sore constantly but worse with palpation.   Nodule neck: feeling a lump and tender spot under chin going on for months and is worried about it  Review of Systems  Constitutional: Negative for fever or weight change.  Respiratory: Negative for cough and shortness of breath.   Cardiovascular: Negative for chest pain or palpitations.  Gastrointestinal: Negative for abdominal pain, no bowel changes.  Musculoskeletal: Negative for gait problem or joint swelling.  Skin: Negative for rash.  Neurological: positive  for intermittent dizziness but no recent headache.  No other specific complaints in a complete review of systems (except as listed in HPI above).  Past Medical History:  Diagnosis Date  . Allergy   . Anxiety    situational anxiety  . Arrhythmia    A-fib/A-flutter  . Atrial flutter ([place])   . 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  MammoSite  . Cancer ([place]) 2017  . Depression   . Diet-controlled type 2 diabetes mellitus ([place])   . Difficult intubation 2009   Hospers had to use  'boogee"  . Female cystocele 01/05/2011  . Fibromyalgia   . GERD (gastroesophageal reflux disease)   . Headache(784.0)   . Kidney stones   . Obesity   . Personal history of radiation therapy 2017   LEFT lumpectomy  . PONV (postoperative nausea and vomiting)    h/o difficult intubation and  anes had to use "boogee"    Past Surgical History:  Procedure Laterality Date  . ABDOMINAL HYSTERECTOMY  2013  . ANTERIOR AND POSTERIOR REPAIR  11/30/2011   Procedure: ANTERIOR (CYSTOCELE) AND POSTERIOR REPAIR (RECTOCELE);  Surgeon: Reece Packer, MD;  Location: [facility];  Service: Urology;  Laterality: N/A;  with cysto  . BLADDER SUSPENSION  11/30/2011   Procedure: [facility] PROCEDURE;  Surgeon: Reece Packer, MD;  Location: [facility];  Service: Urology;  Laterality: N/A;  graft 10x6  . BREAST BIOPSY Right 2007   negative core  . BREAST BIOPSY Left 09/16/2015   DCIS  . BREAST LUMPECTOMY Left 09/30/2015   DCIS clear margins and rad tx/HIGH GRADE DUCTAL CARCINOMA IN SITU, COMEDO TYPE  . BREAST LUMPECTOMY WITH NEEDLE LOCALIZATION Left 09/30/2015   DCIS excision, MammoSite radiation;  Surgeon: Robert Bellow, MD;  Location: [facility];  Service: General;  Laterality: Left;  . CESAREAN SECTION     x 1  . CHOLECYSTECTOMY  2005  . COLONOSCOPY  2009  . CYSTOSCOPY  11/30/2011   Procedure: CYSTOSCOPY;  Surgeon: Reece Packer, MD;  Location: [facility];  Service: Urology;;  . Brigitte Pulse AND CURETTAGE OF UTERUS  05/05/2007  . GALLBLADDER SURGERY  2004  . TONSILLECTOMY  1961  . VAGINAL HYSTERECTOMY  11/30/2011   Procedure: HYSTERECTOMY VAGINAL;  Surgeon: Emily Filbert, MD;  Location: [facility];  Service: Gynecology;  Laterality: N/A;    Family History  Problem Relation Age of Onset  . Diabetes Mother   . Hearing loss Mother   . Hypertension Mother   . Heart disease Father   . Heart disease Paternal Grandfather   . Asthma Daughter   . Asthma Daughter   . Migraines Daughter   . Fibromyalgia Daughter   . Interstitial cystitis Daughter   . Asthma Daughter   . Colitis Daughter   . Thyroid cancer Daughter 63  . Diabetes Brother   . Hypertension Brother   . Breast cancer Neg Hx     Social History   Socioeconomic History  . Marital status: Married    Spouse name: Environmental consultant   . Number of  children: 3  . Years of education: 16  . Highest education level: Bachelor's degree (e.g., BA, AB, BS)  Occupational History  . Not on file  Social Needs  . Financial resource strain: Not hard at all  . Food insecurity:    Worry: Never true    Inability: Never true  . Transportation needs:    Medical: No    Non-medical: No  Tobacco Use  . Smoking status: Never Smoker  . Smokeless tobacco: Never Used  Substance and Sexual Activity  . Alcohol use: Not Currently    Alcohol/week: 0.0 standard drinks    Frequency: Never    Comment: Due to AF  . Drug use: No  . Sexual activity: Yes    Partners: Male  Lifestyle  . Physical activity:    Days per week: 3 days    Minutes per session: 40 min  . Stress: To some extent  Relationships  . Social connections:    Talks on phone: More than three times a week    Gets together: More than three times a week    Attends religious service: More than 4 times per year    Active member of club or organization: Yes    Attends meetings of clubs or organizations: More than 4 times per year    Relationship status: Married  . Intimate partner violence:    Fear of current or ex partner: No    Emotionally abused: No    Physically abused: No    Forced sexual activity: No  Other Topics Concern  . Not on file  Social History Narrative  . Not on file    Outpatient Encounter Medications as of 03/13/2018  Medication Sig Note  . Clindamycin Phosphate foam Apply 1 Units topically as needed. For rosacea   06/02/2015: Received from: External Pharmacy  . DULoxetine (CYMBALTA) 60 MG capsule TAKE 1 CAPSULE BY MOUTH ONCE DAILY   . FINACEA 15 % FOAM    . gabapentin (NEURONTIN) 100 MG capsule Take 1 capsule (100 mg total) by mouth 2 (two) times daily.   Marland Kitchen levocetirizine (XYZAL) 5 MG tablet Take 1 tablet (5 mg total) by mouth every evening. (Patient taking differently: Take 5 mg by mouth as needed. )   . magnesium oxide (MAG-OX) 400 MG tablet Take 400 mg by mouth  every morning.    . meloxicam (MOBIC) 15 MG tablet Take 15 mg by mouth as needed.    . mometasone (NASONEX) 50 MCG/ACT nasal spray Place 2 sprays into the nose every morning.  07/31/2014: Received from: [facility]: Place into the nose.  . naratriptan (AMERGE) 2.5 MG tablet TAKE 1 TABLET AT ONSET OF HEADACHE MAY REPEAT AFTER 4 HOURS IF RETURNS OR DOES NOT  RESOLVE MAX OF 2 TABS PER DAY   . omeprazole (PRILOSEC) 20 MG capsule Take 1 capsule (20 mg total) by mouth daily.   . Probiotic Product (PROBIOTIC PO) Take 1 capsule by mouth daily.    . tamsulosin (FLOMAX) 0.4 MG CAPS capsule Take 2 capsules (0.8 mg total) by mouth daily after breakfast.   . triamcinolone cream (KENALOG) 0.1 % Apply 1 application topically 2 (two) times daily.   . [DISCONTINUED] ELIQUIS 5 MG TABS tablet TAKE 1 TABLET BY MOUTH TWICE A DAY   . [DISCONTINUED] flecainide (TAMBOCOR) 50 MG tablet Take 1 tablet (50 mg total) by mouth 2 (two) times daily.   . [DISCONTINUED] propranolol (INDERAL) 10 MG tablet Take 1 tablet (10 mg) by mouth once daily as needed when taking extra flecainide for breakthrough a-fib    No facility-administered encounter medications on file as of 03/13/2018.     Allergies  Allergen Reactions  . Adhesive [Tape] Dermatitis    SKIN TURNS RED-PAPER TAPE OK TO USE  . Levaquin  [Levofloxacin]   . Macrodantin [Nitrofurantoin] Nausea And Vomiting  . Metronidazole Nausea Only  . Nitrofurantoin Macrocrystal Nausea And Vomiting  . Sulfamethoxazole-Trimethoprim Nausea Only  . Cephalexin Rash  . Clindamycin Hcl Rash  . Doxycycline Swelling and Rash  . Erythromycin Rash    Care Team Updated in EHR: Yes  Last Vision Exam:Wears corrective lenses: Yes - only for reading, failed vision test has appointment March 2020 - Dr. Carmelia Roller  Last Dental Exam:  Dr. Jeannette [facility] Mel Almond  Last Hearing Exam: passed screening today.  Wears Hearing Aids: No  Functional Ability / Safety  Screening 1.  Was the timed Get Up and Go test shorter than 30 seconds?  yes 2.  Does the patient need help with the phone, transportation, shopping,      preparing meals, housework, laundry, medications, or managing money?  no 3.  Is the patient's home free of loose throw rugs in walkways, pet beds, electrical cords, etc?   yes      Grab bars in the bathroom? no      Handrails on the stairs?   yes      Adequate lighting?   yes 4.  Has the patient noticed any hearing difficulties?   no  Diet Recall and Exercise Regimen: balanced diet, she is doing some exercise at home 3 x weekly 40 minutes  Advanced Care Planning: A voluntary discussion about advance care planning including the explanation and discussion of advance directives.  Discussed health care proxy and Living will, and the patient was able to identify a health care proxy as husband .  Patient does have a living will at present time. If patient does have living will, I have requested they bring this to the clinic to be scanned in to their chart. Does patient have a HCPOA?    yes If yes, name and contact information: Lakeisha Waldrop  Does patient have a living will or MOST form?  yes  Cancer Screenings: Skin: discussed atypical lesions  Lung:  Low Dose CT Chest recommended if Age 30-80 years, 30 pack-year currently smoking OR have quit w/in 15years. Patient does not qualify. Breast:  Up to date on Mammogram? Yes  Up to date of Bone Density/Dexa? No Colon: 03/03/2017  Additional Screenings:  Hepatitis B/HIV/Syphillis:N/A Hepatitis C Screening: negative 07/13/2016 Intimate Partner Violence: negative screen   Objective:   Vitals: BP 116/70 (BP Location: Left Arm, Patient Position: Sitting, Cuff Size: Normal)   Pulse  79   Temp 98 F (36.7 C) (Oral)   Resp 16   Ht 5\' 6"  (1.676 m)   Wt 205 lb (93 kg)   SpO2 94%   BMI 33.09 kg/m  Body mass index is 33.09 kg/m.   Hearing Screening   125Hz  250Hz  500Hz  1000Hz  2000Hz  3000Hz   4000Hz  6000Hz  8000Hz   Right ear:   Pass Pass Pass  Pass    Left ear:   Pass Pass Pass  Pass      Visual Acuity Screening   Right eye Left eye Both eyes  Without correction: 20/40 20/100 20/40  With correction:       Physical Exam Constitutional: Patient appears well-developed and well-nourished. Obese No distress.  HEENT: head atraumatic, normocephalic, pupils equal and reactive to light, neck supple, throat within normal limits, tender to palpation under chin, over cricoid cartilage Cardiovascular: Normal rate, regular rhythm and normal heart sounds.  No murmur heard. No BLE edema. Pulmonary/Chest: Effort normal and breath sounds normal. No respiratory distress. Abdominal: Soft.  There is no tenderness. Muscular skeletal: pain on left arm, nodules medial to left deltoid  Psychiatric: Patient has a normal mood and affect. behavior is normal. Judgment and thought content normal.  Cognitive Testing - 6-CIT  Correct? Score   What year is it? yes 0 Yes = 0    No = 4  What month is it? yes 0 Yes = 0    No = 3  Remember:     Pia Mau, [place], [place]     What time is it? yes 0 Yes = 0    No = 3  Count backwards from 20 to 1 yes 0 Correct = 0    1 error = 2   More than 1 error = 4  Say the months of the year in reverse. yes 0 Correct = 0    1 error = 2   More than 1 error = 4  What address did I ask you to remember? yes 0 Correct = 0  1 error = 2    2 error = 4    3 error = 6    4 error = 8    All wrong = 10       TOTAL SCORE  0/28   Interpretation:  Normal  Normal (0-7) Abnormal (8-28)   Fall Risk: Fall Risk  03/13/2018 03/02/2018 08/30/2017 08/10/2017 02/10/2017  Falls in the past year? 0 1 No No No  Number falls in past yr: 0 0 - - -  Injury with Fall? 0 1 - - -  Comment - Bilateral Knees and left arm - - -    Depression Screen Depression screen [facility] 2/9 03/13/2018 03/02/2018 08/30/2017 08/10/2017 11/03/2016  Decreased Interest 0 0 0 0 0  Down, Depressed, Hopeless 0 0 1 1 0   PHQ - 2 Score 0 0 1 1 0  Altered sleeping 1 3 0 1 -  Tired, decreased energy 2 1 1 1  -  Change in appetite 0 0 1 1 -  Feeling bad or failure about yourself  0 0 0 1 -  Trouble concentrating 0 0 0 0 -  Moving slowly or fidgety/restless 0 0 0 0 -  Suicidal thoughts 0 0 0 0 -  PHQ-9 Score 3 4 3 5  -  Difficult doing work/chores Not difficult at all Not difficult at all Not difficult at all Somewhat difficult -    Recent Results (from the  past 2160 hour(s))  POCT UA - Microalbumin     Status: Normal   Collection Time: 03/02/18  9:21 AM  Result Value Ref Range   Microalbumin Ur, POC negative mg/L   Creatinine, POC     Albumin/Creatinine Ratio, Urine, POC    POCT HgB A1C     Status: Normal   Collection Time: 03/02/18  9:23 AM  Result Value Ref Range   Hemoglobin A1C     HbA1c POC (<> result, manual entry)     HbA1c, POC (prediabetic range)     HbA1c, POC (controlled diabetic range) 5.5 0.0 - 7.0 %  Lipid panel     Status: Abnormal   Collection Time: 03/02/18 10:00 AM  Result Value Ref Range   Cholesterol 186 <200 mg/dL   HDL 60 >50 mg/dL   Triglycerides 87 <150 mg/dL   LDL Cholesterol (Calc) 108 (H) mg/dL (calc)    Comment: Reference range: <100 . Desirable range <100 mg/dL for primary prevention;   <70 mg/dL for patients with CHD or diabetic patients  with > or = 2 CHD risk factors. Marland Kitchen LDL-C is now calculated using the Martin-Hopkins  calculation, which is a validated novel method providing  better accuracy than the Friedewald equation in the  estimation of LDL-C.  Cresenciano Genre et al. Annamaria Helling. 3976;734(19): 2061-2068  (http://education.[facility].com/faq/FAQ164)    Total CHOL/HDL Ratio 3.1 <5.0 (calc)   Non-HDL Cholesterol (Calc) 126 <130 mg/dL (calc)    Comment: For patients with diabetes plus 1 major ASCVD risk  factor, treating to a non-HDL-C goal of <100 mg/dL  (LDL-C of <70 mg/dL) is considered a therapeutic  option.   COMPLETE METABOLIC PANEL WITH GFR     Status:  Abnormal   Collection Time: 03/02/18 10:00 AM  Result Value Ref Range   Glucose, Bld 84 65 - 99 mg/dL    Comment: .            Fasting reference interval .    BUN 20 7 - 25 mg/dL   Creat 0.86 0.50 - 0.99 mg/dL    Comment: For patients >38 years of age, the reference limit for Creatinine is approximately 13% higher for people identified as African-American. .    GFR, Est Non African American 71 > OR = 60 mL/min/1.23m2   GFR, Est African American 83 > OR = 60 mL/min/1.41m2   BUN/Creatinine Ratio NOT APPLICABLE 6 - 22 (calc)   Sodium 141 135 - 146 mmol/L   Potassium 4.8 3.5 - 5.3 mmol/L   Chloride 105 98 - 110 mmol/L   CO2 30 20 - 32 mmol/L   Calcium 9.7 8.6 - 10.4 mg/dL   Total Protein 6.6 6.1 - 8.1 g/dL   Albumin 4.2 3.6 - 5.1 g/dL   Globulin 2.4 1.9 - 3.7 g/dL (calc)   AG Ratio 1.8 1.0 - 2.5 (calc)   Total Bilirubin 0.3 0.2 - 1.2 mg/dL   Alkaline phosphatase (APISO) 144 (H) 33 - 130 U/L   AST 12 10 - 35 U/L   ALT 16 6 - 29 U/L    Assessment & Plan:    1. Welcome to Medicare preventive visit  - EKG 12-Lead - DG Bone Density; Future  2. Ovarian failure  - DG Bone Density; Future  3. Encounter for screening mammogram for breast cancer  - MM Digital Screening; Future   4. Nodule, subcutaneous  - US SOFT TISSUE UPPER EXTREMITY LIMITED LEFT (NON-VASCULAR); Future  5. Anterior neck pain  - [name] SOFT  TISSUE HEAD & NECK (NON-THYROID); Future  6. Need for vaccination for pneumococcus  - Pneumococcal polysaccharide vaccine 23-valent greater than or equal to 2yo subcutaneous/IM  Exercise Activities and Dietary recommendations  She will exercise for 40 minutes 5 days a week Calorie count  - Discussed health benefits of physical activity, and encouraged her to engage in regular exercise appropriate for her age and condition.   Immunization History  Administered Date(s) Administered  . Influenza Split 10/25/2006, 10/07/2011  . Influenza, Quadrivalent,  Recombinant, Inj, Pf 11/11/2017  . Influenza, Seasonal, Injecte, Preservative Fre 10/09/2010  . Influenza-Unspecified 10/23/2013, 11/05/2015, 11/10/2016  . Pneumococcal Conjugate-13 09/24/2015  . Pneumococcal Polysaccharide-23 04/17/2011  . Tdap 10/25/2006, 02/10/2017  . Zoster 11/16/2010  . Zoster Recombinat (Shingrix) 10/01/2017, 02/13/2018    Health Maintenance  Topic Date Due  . OPHTHALMOLOGY EXAM  02/02/2018  . PNA vac Low Risk Adult (2 of 2 - PPSV23) 03/07/2018  . HIV Screening  03/03/2019 (Originally 03/07/1970)  . HEMOGLOBIN A1C  08/31/2018  . FOOT EXAM  03/03/2019  . URINE MICROALBUMIN  03/03/2019  . MAMMOGRAM  09/22/2019  . TETANUS/TDAP  02/11/2027  . COLONOSCOPY  03/04/2027  . INFLUENZA VACCINE  Completed  . DEXA SCAN  Completed  . Hepatitis C Screening  Completed    No orders of the defined types were placed in this encounter.   Current Outpatient Medications:  .  Clindamycin Phosphate foam, Apply 1 Units topically as needed. For rosacea  , Disp: , Rfl: 5 .  DULoxetine (CYMBALTA) 60 MG capsule, TAKE 1 CAPSULE BY MOUTH ONCE DAILY, Disp: 90 capsule, Rfl: 1 .  FINACEA 15 % FOAM, , Disp: , Rfl:  .  gabapentin (NEURONTIN) 100 MG capsule, Take 1 capsule (100 mg total) by mouth 2 (two) times daily., Disp: 180 capsule, Rfl: 1 .  levocetirizine (XYZAL) 5 MG tablet, Take 1 tablet (5 mg total) by mouth every evening. (Patient taking differently: Take 5 mg by mouth as needed. ), Disp: 30 tablet, Rfl: 0 .  magnesium oxide (MAG-OX) 400 MG tablet, Take 400 mg by mouth every morning. , Disp: , Rfl:  .  meloxicam (MOBIC) 15 MG tablet, Take 15 mg by mouth as needed. , Disp: , Rfl:  .  mometasone (NASONEX) 50 MCG/ACT nasal spray, Place 2 sprays into the nose every morning. , Disp: , Rfl:  .  naratriptan (AMERGE) 2.5 MG tablet, TAKE 1 TABLET AT ONSET OF HEADACHE MAY REPEAT AFTER 4 HOURS IF RETURNS OR DOES NOT RESOLVE MAX OF 2 TABS PER DAY, Disp: 9 tablet, Rfl: 5 .  omeprazole (PRILOSEC)  20 MG capsule, Take 1 capsule (20 mg total) by mouth daily., Disp: 90 capsule, Rfl: 1 .  Probiotic Product (PROBIOTIC PO), Take 1 capsule by mouth daily. , Disp: , Rfl:  .  tamsulosin (FLOMAX) 0.4 MG CAPS capsule, Take 2 capsules (0.8 mg total) by mouth daily after breakfast., Disp: 90 capsule, Rfl: 3 .  triamcinolone cream (KENALOG) 0.1 %, Apply 1 application topically 2 (two) times daily., Disp: 30 g, Rfl: 0 .  apixaban (ELIQUIS) 5 MG TABS tablet, Take 1 tablet (5 mg total) by mouth 2 (two) times daily., Disp: 180 tablet, Rfl: 3 .  flecainide (TAMBOCOR) 100 MG tablet, Take 1 tablet (100 mg total) by mouth 2 (two) times daily., Disp: 180 tablet, Rfl: 3 .  flecainide (TAMBOCOR) 50 MG tablet, Take 1 tablet (50 mg) by mouth once daily as needed for breakthrough a-fib, Disp: 60 tablet, Rfl: 6 .  metoprolol succinate (TOPROL-XL) 25 MG 24 hr tablet, Take 0.5 tablets (12.5 mg total) by mouth daily., Disp: 45 tablet, Rfl: 3 .  propranolol (INDERAL) 10 MG tablet, Take 1 tablet (10 mg) by mouth once daily as needed when taking extra flecainide for breakthrough a-fib, Disp: 30 tablet, Rfl: 3 There are no discontinued medications.  I have personally reviewed and addressed the Medicare Annual Wellness health risk assessment questionnaire and have noted the following in the patient's chart:  A.         Medical and social history & family history B.         Use of alcohol, tobacco, and illicit drugs  C.         Current medications and supplements D.         Functional and Cognitive ability and status E.         Nutritional status F.         Physical activity G.        Advance directives H.         List of other physicians I.          Hospitalizations, surgeries, and ER visits in previous 12 months J.         [place] such as hearing, vision, cognitive function, and depression L.         Referrals and appointments: bone density, [name] arm  In addition, I have reviewed and discussed with  patient certain preventive protocols, quality metrics, and best practice recommendations. A written personalized care plan for preventive services as well as general preventive health recommendations were provided to patient.   See attached scanned questionnaire for additional information.

## 2018-03-16 ENCOUNTER — Ambulatory Visit
Admission: RE | Admit: 2018-03-16 | Discharge: 2018-03-16 | Disposition: A | Discharge status: Home / Self Care | Payer: Medicare Other | Source: Ambulatory Visit | Attending: Family Medicine | Admitting: Family Medicine

## 2018-03-16 DIAGNOSIS — M542 Cervicalgia: Secondary | ICD-10-CM | POA: Diagnosis not present

## 2018-03-16 DIAGNOSIS — R2232 Localized swelling, mass and lump, left upper limb: Secondary | ICD-10-CM | POA: Diagnosis not present

## 2018-03-16 DIAGNOSIS — R229 Localized swelling, mass and lump, unspecified: Secondary | ICD-10-CM | POA: Diagnosis not present

## 2018-03-20 ENCOUNTER — Other Ambulatory Visit: Payer: Self-pay | Admitting: *Deleted

## 2018-03-20 ENCOUNTER — Encounter: Payer: Self-pay | Admitting: Family Medicine

## 2018-03-20 MED ORDER — APIXABAN 5 MG PO TABS: 5.0000 mg | ORAL_TABLET | Freq: Two times a day (BID) | ORAL | 3 refills | Status: DC

## 2018-04-18 ENCOUNTER — Ambulatory Visit: Payer: 59 | Admitting: Neurology

## 2018-04-24 DIAGNOSIS — H25813 Combined forms of age-related cataract, bilateral: Secondary | ICD-10-CM | POA: Diagnosis not present

## 2018-04-24 DIAGNOSIS — D3141 Benign neoplasm of right ciliary body: Secondary | ICD-10-CM | POA: Diagnosis not present

## 2018-04-24 LAB — HM DIABETES EYE EXAM

## 2018-04-26 ENCOUNTER — Ambulatory Visit: Payer: Self-pay | Admitting: Neurology

## 2018-05-06 ENCOUNTER — Emergency Department
Admission: EM | Admit: 2018-05-06 | Discharge: 2018-05-06 | Disposition: A | Discharge status: Home / Self Care | Payer: Medicare Other | Attending: Emergency Medicine | Admitting: Emergency Medicine

## 2018-05-06 ENCOUNTER — Emergency Department: Payer: Medicare Other

## 2018-05-06 ENCOUNTER — Encounter: Payer: Self-pay | Admitting: *Deleted

## 2018-05-06 ENCOUNTER — Other Ambulatory Visit: Payer: Self-pay

## 2018-05-06 DIAGNOSIS — Z79899 Other long term (current) drug therapy: Secondary | ICD-10-CM | POA: Diagnosis not present

## 2018-05-06 DIAGNOSIS — Z853 Personal history of malignant neoplasm of breast: Secondary | ICD-10-CM | POA: Diagnosis not present

## 2018-05-06 DIAGNOSIS — R55 Syncope and collapse: Secondary | ICD-10-CM | POA: Diagnosis not present

## 2018-05-06 DIAGNOSIS — I4891 Unspecified atrial fibrillation: Secondary | ICD-10-CM | POA: Diagnosis not present

## 2018-05-06 DIAGNOSIS — I480 Paroxysmal atrial fibrillation: Secondary | ICD-10-CM | POA: Diagnosis not present

## 2018-05-06 DIAGNOSIS — R000 Tachycardia, unspecified: Secondary | ICD-10-CM | POA: Diagnosis not present

## 2018-05-06 DIAGNOSIS — I959 Hypotension, unspecified: Secondary | ICD-10-CM | POA: Diagnosis not present

## 2018-05-06 DIAGNOSIS — Z7901 Long term (current) use of anticoagulants: Secondary | ICD-10-CM | POA: Diagnosis not present

## 2018-05-06 DIAGNOSIS — E119 Type 2 diabetes mellitus without complications: Secondary | ICD-10-CM | POA: Diagnosis not present

## 2018-05-06 DIAGNOSIS — R0902 Hypoxemia: Secondary | ICD-10-CM | POA: Diagnosis not present

## 2018-05-06 DIAGNOSIS — S0990XA Unspecified injury of head, initial encounter: Secondary | ICD-10-CM | POA: Diagnosis not present

## 2018-05-06 LAB — CBC WITH DIFFERENTIAL/PLATELET
Abs Immature Granulocytes: 0.03 10*3/uL (ref 0.00–0.07)
Basophils Absolute: 0 10*3/uL (ref 0.0–0.1)
Basophils Relative: 0 %
Eosinophils Absolute: 0.1 10*3/uL (ref 0.0–0.5)
Eosinophils Relative: 1 %
HCT: 46.2 % — ABNORMAL HIGH (ref 36.0–46.0)
Hemoglobin: 14.9 g/dL (ref 12.0–15.0)
Immature Granulocytes: 0 %
Lymphocytes Relative: 13 %
Lymphs Abs: 1.4 10*3/uL (ref 0.7–4.0)
MCH: 30.1 pg (ref 26.0–34.0)
MCHC: 32.3 g/dL (ref 30.0–36.0)
MCV: 93.3 fL (ref 80.0–100.0)
Monocytes Absolute: 0.5 10*3/uL (ref 0.1–1.0)
Monocytes Relative: 5 %
Neutro Abs: 8.5 10*3/uL — ABNORMAL HIGH (ref 1.7–7.7)
Neutrophils Relative %: 81 %
Platelets: 337 10*3/uL (ref 150–400)
RBC: 4.95 MIL/uL (ref 3.87–5.11)
RDW: 14.2 % (ref 11.5–15.5)
WBC: 10.5 10*3/uL (ref 4.0–10.5)
nRBC: 0 % (ref 0.0–0.2)

## 2018-05-06 LAB — BASIC METABOLIC PANEL
Anion gap: 7 (ref 5–15)
BUN: 23 mg/dL (ref 8–23)
CO2: 26 mmol/L (ref 22–32)
Calcium: 8.6 mg/dL — ABNORMAL LOW (ref 8.9–10.3)
Chloride: 104 mmol/L (ref 98–111)
Creatinine, Ser: 0.87 mg/dL (ref 0.44–1.00)
GFR calc Af Amer: >60 mL/min (ref >60)
GFR calc non Af Amer: >60 mL/min (ref >60)
Glucose, Bld: 116 mg/dL — ABNORMAL HIGH (ref 70–99)
Potassium: 4.3 mmol/L (ref 3.5–5.1)
Sodium: 137 mmol/L (ref 135–145)

## 2018-05-06 LAB — MAGNESIUM: Magnesium: 2.1 mg/dL (ref 1.7–2.4)

## 2018-05-06 LAB — TROPONIN I: Troponin I: <0.03 ng/mL (ref <0.03)

## 2018-05-06 MED ORDER — SODIUM CHLORIDE 0.9 % IV BOLUS
1000.0000 mL | Freq: Once | INTRAVENOUS | Status: AC
  Administered 2018-05-06: 1000 mL via INTRAVENOUS

## 2018-05-06 MED ORDER — MAGNESIUM SULFATE 2 GM/50ML IV SOLN
2.0000 g | Freq: Once | INTRAVENOUS | Status: AC
  Administered 2018-05-06: 2 g via INTRAVENOUS
  Filled 2018-05-06: qty 50

## 2018-05-06 MED ORDER — ETOMIDATE 2 MG/ML IV SOLN
9.0000 mg | Freq: Once | INTRAVENOUS | Status: AC
  Administered 2018-05-06: 9 mg via INTRAVENOUS
  Filled 2018-05-06: qty 10

## 2018-05-06 NOTE — ED Provider Notes (Signed)
[facility] Emergency Department Provider Note  ____________________________________________  Time seen: Approximately 2:18 PM  I have reviewed the triage vital signs and the nursing notes.   HISTORY  Chief Complaint Hypotension   HPI Sonya Lopez is a 63 y.o. female with a history of paroxysmal atrial fibrillation/atrial flutter on flecainide, metoprolol, and Eliquis DCIS in 2017, diet-controlled diabetes who presents for evaluation of atrial fibrillation.  Patient reports that she woke up this morning in A. fib.  She is very sensitive to it.  She was feeling her heart racing.  She was told by her doctor to take an extra flecainide pill and propanolol when she had symptoms.  So she took 2 flecainide and a dose of propranolol.  After taking those medications patient had a syncopal event. She denies injuring herself. She woke up on the floor. She went back to sleep and when she woke up she was still in A. Fib.  Patient then took her morning dose of Toprol which is usually 12.5 but she took 25 mg.  She reports intermittent chest discomfort that she describes as mild pressure in the center of her chest that is usually associated when her heart rate goes really fast.  She denies ever passing out from A. fib.  When EMS arrived patient was found to be hypotensive and orthostatics with systolics in the 68L.  Patient denies shortness of breath, cough, fever, vomiting, diarrhea.  She reports being her usual state of health yesterday when she went to bed.  She endorses compliance with her Eliquis.  Past Medical History:  Diagnosis Date  . Allergy   . Anxiety    situational anxiety  . Arrhythmia    A-fib/A-flutter  . Atrial flutter ([place])   . 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  MammoSite  . Cancer ([place]) 2017  . Depression   . Diet-controlled type 2 diabetes mellitus ([place])   . Difficult intubation  2009   Ashley had to use  'boogee"  . Female cystocele 01/05/2011  . Fibromyalgia   . GERD (gastroesophageal reflux disease)   . Headache(784.0)   . Kidney stones   . Obesity   . Personal history of radiation therapy 2017   LEFT lumpectomy  . PONV (postoperative nausea and vomiting)    h/o difficult intubation and anes had to use "boogee"    Patient Active Problem List   Diagnosis Date Noted  . Pain, elbow joint 02/02/2018  . PAF (paroxysmal atrial fibrillation) ([place]) 09/13/2017  . Trochanteric bursitis of left hip 06/24/2017  . Low back pain 02/22/2017  . Benign breast cyst in female, right 05/25/2016  . History of left breast cancer 09/16/2015  . Long-term use of high-risk medication 02/25/2015  . Increased urinary frequency 08/01/2014  . Mild major depression ([place]) 07/29/2014  . Gastric reflux 07/29/2014  . Chronic interstitial cystitis 07/29/2014  . Calculus of kidney 07/29/2014  . Allergic rhinitis 07/29/2014  . Acne erythematosa 07/29/2014  . Lumbosacral radiculitis 07/29/2014  . Tinnitus 07/29/2014  . Mixed incontinence, urge and stress (female) (female) 08/23/2013  . Hyperlipidemia 04/25/2013  . H/O: hysterectomy 11/30/2011  . Atrial flutter ([place]) 05/03/2011  . Diet-controlled type 2 diabetes mellitus ([place]) 04/16/2011  . Bladder cystocele 01/05/2011  . Migraine without aura and without status migrainosus, not intractable 12/29/2010  . Fibromyalgia 12/29/2010  . Obesity 12/29/2010  . Migraine 12/29/2010    Past Surgical History:  Procedure Laterality Date  . ABDOMINAL HYSTERECTOMY  2013  . ANTERIOR AND POSTERIOR REPAIR  11/30/2011   Procedure: ANTERIOR (CYSTOCELE) AND POSTERIOR REPAIR (RECTOCELE);  Surgeon: Reece Packer, MD;  Location: [facility];  Service: Urology;  Laterality: N/A;  with cysto  . BLADDER SUSPENSION  11/30/2011   Procedure: [facility] PROCEDURE;  Surgeon: Reece Packer, MD;  Location: [facility];  Service: Urology;   Laterality: N/A;  graft 10x6  . BREAST BIOPSY Right 2007   negative core  . BREAST BIOPSY Left 09/16/2015   DCIS  . BREAST LUMPECTOMY Left 09/30/2015   DCIS clear margins and rad tx/HIGH GRADE DUCTAL CARCINOMA IN SITU, COMEDO TYPE  . BREAST LUMPECTOMY WITH NEEDLE LOCALIZATION Left 09/30/2015   DCIS excision, MammoSite radiation;  Surgeon: Robert Bellow, MD;  Location: [facility];  Service: General;  Laterality: Left;  . CESAREAN SECTION     x 1  . CHOLECYSTECTOMY  2005  . COLONOSCOPY  2009  . CYSTOSCOPY  11/30/2011   Procedure: CYSTOSCOPY;  Surgeon: Reece Packer, MD;  Location: [facility];  Service: Urology;;  . Brigitte Pulse AND CURETTAGE OF UTERUS  05/05/2007  . GALLBLADDER SURGERY  2004  . TONSILLECTOMY  1961  . VAGINAL HYSTERECTOMY  11/30/2011   Procedure: HYSTERECTOMY VAGINAL;  Surgeon: Emily Filbert, MD;  Location: [facility];  Service: Gynecology;  Laterality: N/A;    Prior to Admission medications   Medication Sig Start Date End Date Taking? Authorizing Provider  apixaban (ELIQUIS) 5 MG TABS tablet Take 1 tablet (5 mg total) by mouth 2 (two) times daily. 03/20/18   [name], MD  Clindamycin Phosphate foam Apply 1 Units topically as needed. For rosacea   04/15/15   Brendolyn Patty, MD  DULoxetine (CYMBALTA) 60 MG capsule TAKE 1 CAPSULE BY MOUTH ONCE DAILY 02/22/18   Steele Sizer, MD  FINACEA 15 % FOAM  04/19/16   [provider]  flecainide (TAMBOCOR) 100 MG tablet Take 1 tablet (100 mg total) by mouth 2 (two) times daily. 03/13/18   [name], MD  flecainide (TAMBOCOR) 50 MG tablet Take 1 tablet (50 mg) by mouth once daily as needed for breakthrough a-fib 03/13/18   [name], MD  gabapentin (NEURONTIN) 100 MG capsule Take 1 capsule (100 mg total) by mouth 2 (two) times daily. 03/02/18   Steele Sizer, MD  levocetirizine (XYZAL) 5 MG tablet Take 1 tablet (5 mg total) by mouth every evening. Patient taking differently: Take 5 mg by mouth as needed.  02/10/17    Steele Sizer, MD  magnesium oxide (MAG-OX) 400 MG tablet Take 400 mg by mouth every morning.     [provider]  meloxicam (MOBIC) 15 MG tablet Take 15 mg by mouth as needed.     Lovell Sheehan, MD  metoprolol succinate (TOPROL-XL) 25 MG 24 hr tablet Take 0.5 tablets (12.5 mg total) by mouth daily. 03/13/18   [name], MD  mometasone (NASONEX) 50 MCG/ACT nasal spray Place 2 sprays into the nose every morning.     [provider]  naratriptan (AMERGE) 2.5 MG tablet TAKE 1 TABLET AT ONSET OF HEADACHE MAY REPEAT AFTER 4 HOURS IF RETURNS OR DOES NOT RESOLVE MAX OF 2 TABS PER DAY 09/13/17   Sowles, Drue Stager, MD  omeprazole (PRILOSEC) 20 MG capsule Take 1 capsule (20 mg total) by mouth daily. 03/02/18   Steele Sizer, MD  Probiotic Product (PROBIOTIC PO) Take 1 capsule by mouth daily.  [provider]  propranolol (INDERAL) 10 MG tablet Take 1 tablet (10 mg) by mouth once daily as needed when taking extra flecainide for breakthrough a-fib 03/13/18   [name], MD  tamsulosin (FLOMAX) 0.4 MG CAPS capsule Take 2 capsules (0.8 mg total) by mouth daily after breakfast. 10/12/17   McGowan, Larene [place] A, PA-C  triamcinolone cream (KENALOG) 0.1 % Apply 1 application topically 2 (two) times daily. 03/02/18   Steele Sizer, MD    Allergies Adhesive [tape]; Levaquin  [levofloxacin]; Macrodantin [nitrofurantoin]; Metronidazole; Nitrofurantoin macrocrystal; Sulfamethoxazole-trimethoprim; Cephalexin; Clindamycin hcl; Doxycycline; and Erythromycin  Family History  Problem Relation Age of Onset  . Diabetes Mother   . Hearing loss Mother   . Hypertension Mother   . Heart disease Father   . Heart disease Paternal Grandfather   . Asthma Daughter   . Asthma Daughter   . Migraines Daughter   . Fibromyalgia Daughter   . Interstitial cystitis Daughter   . Asthma Daughter   . Colitis Daughter   . Thyroid cancer Daughter 43  . Diabetes Brother   . Hypertension Brother    . Breast cancer Neg Hx     Social History Social History   Tobacco Use  . Smoking status: Never Smoker  . Smokeless tobacco: Never Used  Substance Use Topics  . Alcohol use: Not Currently    Alcohol/week: 0.0 standard drinks    Frequency: Never    Comment: Due to AF  . Drug use: No    Review of Systems  Constitutional: Negative for fever. + syncope Eyes: Negative for visual changes. ENT: Negative for sore throat. Neck: No neck pain  Cardiovascular: + chest discomfort and palpitation. Respiratory: Negative for shortness of breath. Gastrointestinal: Negative for abdominal pain, vomiting or diarrhea. Genitourinary: Negative for dysuria. Musculoskeletal: Negative for back pain. Skin: Negative for rash. Neurological: Negative for headaches, weakness or numbness. Psych: No SI or HI  ____________________________________________   PHYSICAL EXAM:  VITAL SIGNS: ED Triage Vitals  Enc Vitals Group     BP 05/06/18 1143 (!) 117/58     Pulse Rate 05/06/18 1143 (!) 58     Resp 05/06/18 1143 18     Temp 05/06/18 1143 (!) 97.5 F (36.4 C)     Temp Source 05/06/18 1143 Oral     SpO2 05/06/18 1143 95 %     Weight 05/06/18 1145 205 lb (93 kg)     Height 05/06/18 1145 5\' 6"  (1.676 m)     Head Circumference --      Peak Flow --      Pain Score 05/06/18 1144 5     Pain Loc --      Pain Edu? --      Excl. in [place]? --     Constitutional: Alert and oriented. Well appearing and in no apparent distress. HEENT:      Head: Normocephalic and atraumatic.         Eyes: Conjunctivae are normal. Sclera is non-icteric.       Mouth/Throat: Mucous membranes are moist.       Neck: Supple with no signs of meningismus.  No C-spine tenderness Cardiovascular: Irregularly irregular rate with tachycardic rhythm. No murmurs, gallops, or rubs. 2+ symmetrical distal pulses are present in all extremities. No JVD. Respiratory: Normal respiratory effort. Lungs are clear to auscultation bilaterally. No  wheezes, crackles, or rhonchi.  Gastrointestinal: Soft, non tender, and non distended with positive bowel sounds. No rebound or guarding. Musculoskeletal: Nontender with normal range  of motion in all extremities. No edema, cyanosis, or erythema of extremities.  No T and L-spine tenderness Neurologic: Normal speech and language. Face is symmetric. Moving all extremities. No gross focal neurologic deficits are appreciated. Skin: Skin is warm, dry and intact. No rash noted. Psychiatric: Mood and affect are normal. Speech and behavior are normal.  ____________________________________________   LABS (all labs ordered are listed, but only abnormal results are displayed)  Labs Reviewed  CBC WITH DIFFERENTIAL/PLATELET - Abnormal; Notable for the following components:      Result Value   HCT 46.2 (*)    Neutro Abs 8.5 (*)    All other components within normal limits  BASIC METABOLIC PANEL - Abnormal; Notable for the following components:   Glucose, Bld 116 (*)    Calcium 8.6 (*)    All other components within normal limits  TROPONIN I  MAGNESIUM   ____________________________________________  EKG  ED ECG REPORT I, Rudene Re, the attending physician, personally viewed and interpreted this ECG.  Atrial fibrillation, rate of 121, prolonged QTC, right axis deviation, diffuse ST depressions with no ST elevation.  13:56 -atrial fibrillation, rate of 96, right axis deviation, persistent diffuse ST depressions with no ST elevation  13:59 -normal sinus rhythm, rate of 73, borderline right axis deviation, normal intervals, no ST elevations or depressions. ____________________________________________  RADIOLOGY  I have personally reviewed the images performed during this visit and I agree with the Radiologist's read.   Interpretation by Radiologist:  Ct Head Wo Contrast  Result Date: 05/06/2018 CLINICAL DATA:  Head trauma, minor, on anticoagulation EXAM: CT HEAD WITHOUT CONTRAST  TECHNIQUE: Contiguous axial images were obtained from the base of the skull through the vertex without intravenous contrast. COMPARISON:  None. FINDINGS: Brain: No evidence of acute infarction, hemorrhage, hydrocephalus, extra-axial collection or mass lesion/mass effect. Vascular: No hyperdense vessel or unexpected calcification. Skull: Right parietal scalp contusion.  No calvarial fracture Sinuses/Orbits: Negative IMPRESSION: 1. No evidence of intracranial injury. 2. Right scalp contusion without calvarial fracture. Electronically Signed   By: Monte Fantasia M.D.   On: 05/06/2018 12:54     ____________________________________________   PROCEDURES  Procedure(s) performed:yes .Cardioversion Date/Time: 05/06/2018 2:29 PM Performed by: Rudene Re, MD Authorized by: Rudene Re, MD   Consent:    Consent obtained:  Written   Consent given by:  Patient   Risks discussed:  Cutaneous burn, death, induced arrhythmia and pain   Alternatives discussed:  Rate-control medication Pre-procedure details:    Cardioversion basis:  Elective   Rhythm:  Atrial flutter   Electrode placement:  Anterior-posterior Patient sedated: Yes. Refer to sedation procedure documentation for details of sedation.  Attempt one:    Cardioversion mode:  Synchronous   Waveform:  Biphasic   Shock (Joules):  120   Shock outcome:  Conversion to normal sinus rhythm Post-procedure details:    Patient status:  Awake   Patient tolerance of procedure:  Tolerated well, no immediate complications .Sedation Date/Time: 05/06/2018 2:30 PM Performed by: Rudene Re, MD Authorized by: Rudene Re, MD   Consent:    Consent obtained:  Written (electronic informed consent)   Consent given by:  Patient   Risks discussed:  Allergic reaction, dysrhythmia, inadequate sedation, nausea, vomiting, respiratory compromise necessitating ventilatory assistance and intubation, prolonged sedation necessitating reversal  and prolonged hypoxia resulting in organ damage   Alternatives discussed:  Analgesia without sedation Universal protocol:    Procedure explained and questions answered to patient or proxy's satisfaction: yes  Relevant documents present and verified: yes     Test results available and properly labeled: yes     Imaging studies available: yes     Required blood products, implants, devices, and special equipment available: yes     Immediately prior to procedure a time out was called: yes     Patient identity confirmation method:  Arm band Indications:    Procedure performed:  Cardioversion Pre-sedation assessment:    Time since last food or drink:  6   ASA classification: class 2 - patient with mild systemic disease     Neck mobility: normal     Mouth opening:  3 or more finger widths   Thyromental distance:  4 finger widths   Mallampati score:  I - soft palate, uvula, fauces, pillars visible   Pre-sedation assessments completed and reviewed: airway patency, cardiovascular function, hydration status, mental status, nausea/vomiting, pain level, respiratory function and temperature     Pre-sedation assessment completed:  05/06/2018 1:30 PM Immediate pre-procedure details:    Reassessment: Patient reassessed immediately prior to procedure     Reviewed: vital signs, relevant labs/tests and NPO status     Verified: bag valve mask available, emergency equipment available, intubation equipment available, IV patency confirmed, oxygen available, reversal medications available and suction available   Procedure details (see MAR for exact dosages):    Preoxygenation:  Nasal cannula   Sedation:  Etomidate   Intra-procedure monitoring:  Blood pressure monitoring, continuous pulse oximetry, cardiac monitor, frequent vital sign checks, frequent LOC assessments and continuous capnometry   Intra-procedure events: none     Total Provider sedation time (minutes):  8 Post-procedure details:    Post-sedation  assessment completed:  05/06/2018 2:31 PM   Attendance: Constant attendance by certified staff until patient recovered     Recovery: Patient returned to pre-procedure baseline     Post-sedation assessments completed and reviewed: airway patency, cardiovascular function, hydration status, mental status and respiratory function     Patient is stable for discharge or admission: yes     Patient tolerance:  Tolerated well, no immediate complications   Critical Care performed: yes  CRITICAL CARE Performed by: Rudene Re  ?  Total critical care time: 30 min  Critical care time was exclusive of separately billable procedures and treating other patients.  Critical care was necessary to treat or prevent imminent or life-threatening deterioration.  Critical care was time spent personally by me on the following activities: development of treatment plan with patient and/or surrogate as well as nursing, discussions with consultants, evaluation of patient's response to treatment, examination of patient, obtaining history from patient or surrogate, ordering and performing treatments and interventions, ordering and review of laboratory studies, ordering and review of radiographic studies, pulse oximetry and re-evaluation of patient's condition.  ____________________________________________   INITIAL IMPRESSION / ASSESSMENT AND PLAN / ED COURSE   63 y.o. female with a history of paroxysmal atrial fibrillation/atrial flutter on flecainide, metoprolol, and Eliquis DCIS in 2017, diet-controlled diabetes who presents for evaluation of atrial fibrillation.  Patient took her morning dose of flecainide and then doubling also took an extra dose of metoprolol and her rescue propranolol all this morning leading to significant orthostasis/hypotension which then led patient to have a syncopal event.  She arrived to the emergency department in atrial fibrillation with RVR.  Blood pressure initially 117/58.   Patient was started on IV fluids and IV magnesium to help improve her blood pressure and hopefully help control her rate.  Patient's rate  continued to be between 100 and 130s and patient continued to feel symptomatic with palpitations and intermittent chest discomfort.  Labs returned with normal troponin, normal kidney function, normal electrolytes.  At that point I offered to increase patient's rate control with Cardizem versus synchronized cardioversion.  Patient opted for cardioversion.  She was cardioverted per procedure note above with etomidate and tolerated well.  Repeat EKG showing sinus rhythm with no ischemic changes.  CT of the head was done due to syncopal event with head trauma in a patient on Eliquis, and the CT was negative for intracranial injury. Patient was monitored for an hour after cardioversion and remained in normal sinus rhythm.  Patient then stable for discharge home on current medication regimen.  Recommended close follow-up with her PCP and her cardiologist.  Discussed my standard return precautions      As part of my medical decision making, I reviewed the following data within the [place] notes reviewed and incorporated, Labs reviewed , EKG interpreted , Old EKG reviewed, Old chart reviewed, Radiograph reviewed , Notes from prior ED visits and [place] Controlled Substance Database    Pertinent labs & imaging results that were available during my care of the patient were reviewed by me and considered in my medical decision making (see chart for details).    ____________________________________________   FINAL CLINICAL IMPRESSION(S) / ED DIAGNOSES  Final diagnoses:  Paroxysmal atrial fibrillation with RVR (HCC)  Syncope, unspecified syncope type      NEW MEDICATIONS STARTED DURING THIS VISIT:  ED Discharge Orders    None       Note:  This document was prepared using Dragon voice recognition software and may include unintentional  dictation errors.    Alfred Levins, [place], MD 05/06/18 [phone]

## 2018-05-06 NOTE — ED Notes (Signed)
Patient is arousable to voice and can answer simple questions. Patient is lying with eyes closed, skin color within normal limits. NAD.

## 2018-05-06 NOTE — Discharge Instructions (Addendum)
Take your medications as prescribed.  Follow-up with Dr. Rockey Situ on Monday.  Return to the emergency room for chest pain, dizziness, shortness of breath.

## 2018-05-06 NOTE — ED Notes (Signed)
Patient given telephone to call family. Patient tolerating PO challenge well.

## 2018-05-06 NOTE — ED Notes (Addendum)
Patient is awake, answers questions easily, eyes open. Patient asked if we were going to shock her now. Patient laughed when told if was done. VSS. Call bell given to patient.

## 2018-05-06 NOTE — ED Notes (Signed)
Patient was cardioverted at 120j per Dr. Alfred Levins. EKG obtained. NSR.

## 2018-05-06 NOTE — ED Notes (Signed)
Etomidate held for rhythm check by Dr. Alfred Levins.

## 2018-05-06 NOTE — ED Notes (Signed)
Patient left via wheelchair. Patient is alert and oriented, ambulated to wheelchair and to car denying dizziness at time.

## 2018-05-06 NOTE — ED Notes (Signed)
Etomidate given. Dr. Alfred Levins, this writer and Karena Addison RN and Lexicographer at bedside.

## 2018-05-06 NOTE — ED Notes (Addendum)
Consent obtained. Patient is familiar with the procedure because her dad had it done. Patient placed on 2L O2 [place] with cap and on cardiac pads.

## 2018-05-06 NOTE — ED Notes (Signed)
Patient given ginger ale and graham crackers per Dr. Alfred Levins. Patient is sitting upright, calm and cooperative, A&O x4. VSS. NSR on monitor.

## 2018-05-06 NOTE — ED Notes (Addendum)
Patient walked with steady gait to room commode, but c/o increased dizziness when standing back up after voiding. Patient was still steady walking the short distance to the stretcher, but had one assist. Patient's rate while standing and walking went to 120's to 130's.

## 2018-05-06 NOTE — ED Triage Notes (Signed)
Per EMS report, patient has a history of afib and felt an episode today. Patient took Fecainide 50 mg and had a syncopal episode. Patient then took Propanolol 10mg  and Metoprolol 25mg  instead of 12.5mg  as ordered. Patient had several near syncope episodes. EMS states orthostatic VS were 119/62 sitting and 84/45 standing. Patient is alert and oriented upon arrival. NS infusing.

## 2018-05-08 ENCOUNTER — Telehealth: Payer: Self-pay | Admitting: Cardiovascular Disease

## 2018-05-08 NOTE — Telephone Encounter (Signed)
Patient calling to check on status - also wants to discuss medication  Please call ASAP

## 2018-05-08 NOTE — Telephone Encounter (Signed)
Pt calling in again in regards to visit in ED. She reports that she is feeling well at this time but wants to speak to MD in regards to events leading up to ED visit/hpotension and syncope.   Pt wants to further investigate if current medication regime is therapeutic.   I made OV with Dr. Rockey Situ on 05/10/18 at 10:20 AM. She verbally consented to treatment and we reviewed video visit procedure.   Pt willing to call office is a fib returns or any new sx or problems arise.

## 2018-05-08 NOTE — Telephone Encounter (Signed)
Pt states Saturday she went into afib and passed out and had to be taken by EMS to [facility]. States she had to be cardioverted, and wanted to let Dr.Gollan know. Please call to discuss.

## 2018-05-10 ENCOUNTER — Telehealth (INDEPENDENT_AMBULATORY_CARE_PROVIDER_SITE_OTHER): Payer: Medicare Other | Admitting: Cardiovascular Disease

## 2018-05-10 ENCOUNTER — Telehealth: Payer: Self-pay

## 2018-05-10 ENCOUNTER — Other Ambulatory Visit: Payer: Self-pay

## 2018-05-10 DIAGNOSIS — I483 Typical atrial flutter: Secondary | ICD-10-CM

## 2018-05-10 DIAGNOSIS — E782 Mixed hyperlipidemia: Secondary | ICD-10-CM

## 2018-05-10 DIAGNOSIS — E119 Type 2 diabetes mellitus without complications: Secondary | ICD-10-CM

## 2018-05-10 DIAGNOSIS — I480 Paroxysmal atrial fibrillation: Secondary | ICD-10-CM | POA: Diagnosis not present

## 2018-05-10 DIAGNOSIS — R55 Syncope and collapse: Secondary | ICD-10-CM | POA: Diagnosis not present

## 2018-05-10 NOTE — Progress Notes (Addendum)
Virtual Visit via Video Note   This visit type was conducted due to national recommendations for restrictions regarding the COVID-19 Pandemic (e.g. social distancing) in an effort to limit this patient's exposure and mitigate transmission in our community.  Due to her co-morbid illnesses, this patient is at least at moderate risk for complications without adequate follow up.  This format is felt to be most appropriate for this patient at this time.  All issues noted in this document were discussed and addressed.  A limited physical exam was performed with this format.  Please refer to the patient's chart for her consent to telehealth for [facility].    Date:  05/10/2018   ID:  Sonya Lopez, DOB 03/24/1955, MRN [id number]  Patient Location:  [place] [address]   Provider location:   [facility], [place] office  PCP:  Steele Sizer, MD  Cardiologist:  Patsy Baltimore  Chief Complaint: Syncope    History of Present Illness:    Sonya Lopez is a 63 y.o. female who presents via audio/video conferencing for a telehealth visit today.   The patient does not symptoms concerning for COVID-19 infection (fever, chills, cough, or new SHORTNESS OF BREATH).   Patient has a past medical history of Diabetes  GERD hospital admission in 04/16/2011 for atrial flutter with RVR.  CT coronary calcium score zero 09/2015 Father had significant history of coronary artery disease but was a heavy smoker She presents for routine followup of her atrial fibrillation and flutter    ER 05/06/18:  for atrial fib, started flecainide 4:30 AM Stood up out of bed, syncope, before flecainide x2 Hospital records reviewed with the patient in detail took 2 flecainide and a dose of propranolol.  After taking those medications patient had a syncopal event x2. She denies injuring herself. She woke up on the floor. -When EMS arrived patient was found to be  hypotensive and orthostatics with systolics in the 95M - started on IV fluids -Patient opted for cardioversion.   Syncope was before the prorpanolol Atrial fib rate was 105-110    Unable to tolerate amiodarone in the past, she had tremor takes propranolol as needed for breakthrough atrial fibrillation/flutter She monitors heart rate using  Alive Core Reports having continued episodes of paroxysmal atrial fib  flutter lasting sometimes several hours at a time  On several episodes had lightheadedness and leg swelling  swelling to her bilateral ankles. Adding it is worse at night.   She reports about 4 times a month she is irregular, in atrial fibrillation. During those times she experiences occasional chest tightness.  Below are three times she was irregular that was tracked by her apple watch.  03/06/2018 up to 150 beats per minute.  02/19/2018  01/28/2018 up to 139 When she undergoes an episode she will take 50 mg of flecainide, extra an effort to convert back to normal sinus rhythm  Lab work reviewed with her Total chol 186, LDL 108     Prior CV studies:   The following studies were reviewed today:    Past Medical History:  Diagnosis Date  . Allergy   . Anxiety    situational anxiety  . Arrhythmia    A-fib/A-flutter  . Atrial flutter ([place])   . 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  MammoSite  . Cancer ([place]) 2017  . Depression   . Diet-controlled type 2  diabetes mellitus ([place])   . Difficult intubation 2009   [place] had to use  'boogee"  . Female cystocele 01/05/2011  . Fibromyalgia   . GERD (gastroesophageal reflux disease)   . Headache(784.0)   . Kidney stones   . Obesity   . Personal history of radiation therapy 2017   LEFT lumpectomy  . PONV (postoperative nausea and vomiting)    h/o difficult intubation and anes had to use "boogee"   Past Surgical History:   Procedure Laterality Date  . ABDOMINAL HYSTERECTOMY  2013  . ANTERIOR AND POSTERIOR REPAIR  11/30/2011   Procedure: ANTERIOR (CYSTOCELE) AND POSTERIOR REPAIR (RECTOCELE);  Surgeon: Reece Packer, MD;  Location: [facility];  Service: Urology;  Laterality: N/A;  with cysto  . BLADDER SUSPENSION  11/30/2011   Procedure: [facility] PROCEDURE;  Surgeon: Reece Packer, MD;  Location: [facility];  Service: Urology;  Laterality: N/A;  graft 10x6  . BREAST BIOPSY Right 2007   negative core  . BREAST BIOPSY Left 09/16/2015   DCIS  . BREAST LUMPECTOMY Left 09/30/2015   DCIS clear margins and rad tx/HIGH GRADE DUCTAL CARCINOMA IN SITU, COMEDO TYPE  . BREAST LUMPECTOMY WITH NEEDLE LOCALIZATION Left 09/30/2015   DCIS excision, MammoSite radiation;  Surgeon: Robert Bellow, MD;  Location: [facility];  Service: General;  Laterality: Left;  . CESAREAN SECTION     x 1  . CHOLECYSTECTOMY  2005  . COLONOSCOPY  2009  . CYSTOSCOPY  11/30/2011   Procedure: CYSTOSCOPY;  Surgeon: Reece Packer, MD;  Location: [facility];  Service: Urology;;  . Brigitte Pulse AND CURETTAGE OF UTERUS  05/05/2007  . GALLBLADDER SURGERY  2004  . TONSILLECTOMY  1961  . VAGINAL HYSTERECTOMY  11/30/2011   Procedure: HYSTERECTOMY VAGINAL;  Surgeon: Emily Filbert, MD;  Location: [facility];  Service: Gynecology;  Laterality: N/A;     No outpatient medications have been marked as taking for the 05/10/18 encounter (Appointment) with [name], MD.     Allergies:   Adhesive [tape]; Levaquin  [levofloxacin]; Macrodantin [nitrofurantoin]; Metronidazole; Nitrofurantoin macrocrystal; Sulfamethoxazole-trimethoprim; Cephalexin; Clindamycin hcl; Doxycycline; and Erythromycin   Social History   Tobacco Use  . Smoking status: Never Smoker  . Smokeless tobacco: Never Used  Substance Use Topics  . Alcohol use: Not Currently    Alcohol/week: 0.0 standard drinks    Frequency: Never    Comment: Due to AF  . Drug use: No     Current Outpatient  Medications on File Prior to Visit  Medication Sig Dispense Refill  . apixaban (ELIQUIS) 5 MG TABS tablet Take 1 tablet (5 mg total) by mouth 2 (two) times daily. 180 tablet 3  . Clindamycin Phosphate foam Apply 1 Units topically as needed. For rosacea    5  . DULoxetine (CYMBALTA) 60 MG capsule TAKE 1 CAPSULE BY MOUTH ONCE DAILY 90 capsule 1  . FINACEA 15 % FOAM     . flecainide (TAMBOCOR) 100 MG tablet Take 1 tablet (100 mg total) by mouth 2 (two) times daily. 180 tablet 3  . flecainide (TAMBOCOR) 50 MG tablet Take 1 tablet (50 mg) by mouth once daily as needed for breakthrough a-fib 60 tablet 6  . gabapentin (NEURONTIN) 100 MG capsule Take 1 capsule (100 mg total) by mouth 2 (two) times daily. 180 capsule 1  . levocetirizine (XYZAL) 5 MG tablet Take 1 tablet (5 mg total) by mouth every evening. (Patient taking differently: Take 5 mg by mouth as  needed. ) 30 tablet 0  . magnesium oxide (MAG-OX) 400 MG tablet Take 400 mg by mouth every morning.     . meloxicam (MOBIC) 15 MG tablet Take 15 mg by mouth as needed.     . metoprolol succinate (TOPROL-XL) 25 MG 24 hr tablet Take 0.5 tablets (12.5 mg total) by mouth daily. 45 tablet 3  . mometasone (NASONEX) 50 MCG/ACT nasal spray Place 2 sprays into the nose every morning.     . naratriptan (AMERGE) 2.5 MG tablet TAKE 1 TABLET AT ONSET OF HEADACHE MAY REPEAT AFTER 4 HOURS IF RETURNS OR DOES NOT RESOLVE MAX OF 2 TABS PER DAY 9 tablet 5  . omeprazole (PRILOSEC) 20 MG capsule Take 1 capsule (20 mg total) by mouth daily. 90 capsule 1  . Probiotic Product (PROBIOTIC PO) Take 1 capsule by mouth daily.     . propranolol (INDERAL) 10 MG tablet Take 1 tablet (10 mg) by mouth once daily as needed when taking extra flecainide for breakthrough a-fib 30 tablet 3  . tamsulosin (FLOMAX) 0.4 MG CAPS capsule Take 2 capsules (0.8 mg total) by mouth daily after breakfast. 90 capsule 3  . triamcinolone cream (KENALOG) 0.1 % Apply 1 application topically 2 (two) times  daily. 30 g 0   No current facility-administered medications on file prior to visit.      Family Hx: [name] family history includes Asthma in her daughter, daughter, and daughter; Colitis in her daughter; Diabetes in her brother and mother; Fibromyalgia in her daughter; Hearing loss in her mother; Heart disease in her father and paternal grandfather; Hypertension in her brother and mother; Interstitial cystitis in her daughter; Migraines in her daughter; Thyroid cancer (age of onset: 52) in her daughter. There is no history of Breast cancer.  ROS:   Please see the history of present illness.    Review of Systems  Constitutional: Negative.   Respiratory: Negative.   Cardiovascular: Negative.   Gastrointestinal: Negative.   Musculoskeletal: Negative.   Neurological: Positive for loss of consciousness.  Psychiatric/Behavioral: Negative.   All other systems reviewed and are negative.     Labs/Other Tests and Data Reviewed:    Recent Labs: 03/02/2018: ALT 16 05/06/2018: BUN 23; Creatinine, Ser 0.87; Hemoglobin 14.9; Magnesium 2.1; Platelets 337; Potassium 4.3; Sodium 137   Recent Lipid Panel Lab Results  Component Value Date/Time   CHOL 186 03/02/2018 10:00 AM   CHOL 209 (H) 06/04/2015 07:53 AM   CHOL 164 04/17/2011 05:07 AM   TRIG 87 03/02/2018 10:00 AM   TRIG 83 04/17/2011 05:07 AM   HDL 60 03/02/2018 10:00 AM   HDL 58 06/04/2015 07:53 AM   HDL 43 04/17/2011 05:07 AM   CHOLHDL 3.1 03/02/2018 10:00 AM   LDLCALC 108 (H) 03/02/2018 10:00 AM   LDLCALC 104 (H) 04/17/2011 05:07 AM    Wt Readings from Last 3 Encounters:  05/06/18 205 lb (93 kg)  03/13/18 205 lb (93 kg)  03/13/18 205 lb (93 kg)     Exam:    Vital Signs: Vital signs as detailed above in HPI  Well nourished, well developed female in no acute distress. Constitutional:  oriented to person, place, and time. No distress.  Head: Normocephalic and atraumatic.  Eyes:  no discharge. No scleral icterus.  Neck:  Normal range of motion. Neck supple.  Pulmonary/Chest: No audible wheezing, no distress, appears comfortable Musculoskeletal: Normal range of motion.  no  tenderness or deformity.  Neurological:   Coordination normal. Full exam  not performed Skin:  No rash Psychiatric:  normal mood and affect. behavior is normal. Thought content normal.    ASSESSMENT & PLAN:    Atrial fibrillation/ flutter, unspecified type ([place]) Does not seem to be holding on flecainide Discussed going back on amiodarone She does report having some shaking and tremor on the amiodarone Long discussion concerning various treatment options   Syncope Suspect secondary to orthostasis Discussed side effect of Flomax which can drop blood pressure Recommendations given below - Please check orthostatics and call us with numbers (or you can sent through [place]) If your blood pressure drops significantly we may need to make some medication changes such as cut back on your Flomax - Please increase your hydration - For low blood pressure use compression hose, back brace (or abdominal binder) Rise to standing position very slowly   Hyperlipidemia We have encouraged continued exercise, careful diet management in an effort to lose weight.  Total cholesterol increased to 186.  She is trying to watch her weight, walking program    COVID-19 Education: The signs and symptoms of COVID-19 were discussed with the patient and how to seek care for testing (follow up with PCP or arrange E-visit).  The importance of social distancing was discussed today.  Patient Risk:   After full review of this patients clinical status, I feel that they are at least moderate risk at this time.  Time:   Today, I have spent 25 minutes with the patient with telehealth technology discussing .     Medication Adjustments/Labs and Tests Ordered: Current medicines are reviewed at length with the patient today.  Concerns regarding medicines are  outlined above.   Tests Ordered: No tests ordered   Medication Changes: No changes made   Disposition: Follow-up in 6 months   Signed, Ida Rogue, MD  05/10/2018 7:47 AM    [place] [facility] [address] [place] [address], [place], [place] [address]

## 2018-05-10 NOTE — Telephone Encounter (Signed)
Spoke with patient.  We did a doxy trial to make sure video and audio works on both ends. Everything went great.  Patient stated she knew what to look for at her appointment time.

## 2018-05-10 NOTE — Patient Instructions (Addendum)
-   Please check orthostatics and call us with numbers (or you can sent through [place]) If your blood pressure drops significantly we may need to make some medication changes such as cut back on your Flomax  - Please increase your hydration  - For low blood pressure use compression hose, back brace (or abdominal binder) Rise to standing position very slowly  Medication Instructions:  No changes  If you need a refill on your cardiac medications before your next appointment, please call your pharmacy.    Lab work: No new labs needed   If you have labs (blood work) drawn today and your tests are completely normal, you will receive your results only by: Marland Kitchen MyChart Message (if you have MyChart) OR . A paper copy in the mail If you have any lab test that is abnormal or we need to change your treatment, we will call you to review the results.   Testing/Procedures: No new testing needed   Follow-Up: At [facility], you and your health needs are our priority.  As part of our continuing mission to provide you with exceptional heart care, we have created designated Provider Care Teams.  These Care Teams include your primary Cardiologist (physician) and Advanced Practice Providers (APPs -  Physician Assistants and Nurse Practitioners) who all work together to provide you with the care you need, when you need it.  . You will need a follow up appointment in 1 month  . Providers on your designated Care Team:   . Murray Hodgkins, NP . Christell Faith, PA-C . Marrianne Mood, PA-C  Any Other Special Instructions Will Be Listed Below (If Applicable).  For educational health videos Log in to : [email or web address] Or : [email or web address], password : [email or web address]

## 2018-05-16 ENCOUNTER — Other Ambulatory Visit: Payer: Self-pay | Admitting: Family Medicine

## 2018-05-16 ENCOUNTER — Encounter: Payer: Self-pay | Admitting: Family Medicine

## 2018-05-16 ENCOUNTER — Telehealth: Payer: Self-pay | Admitting: Cardiovascular Disease

## 2018-05-16 ENCOUNTER — Other Ambulatory Visit: Payer: Self-pay | Admitting: *Deleted

## 2018-05-16 DIAGNOSIS — G43009 Migraine without aura, not intractable, without status migrainosus: Secondary | ICD-10-CM

## 2018-05-16 MED ORDER — PROMETHAZINE HCL 12.5 MG PO TABS: 12.5000 mg | ORAL_TABLET | Freq: Three times a day (TID) | ORAL | 0 refills | Status: DC | PRN

## 2018-05-16 MED ORDER — AMIODARONE HCL 200 MG PO TABS: ORAL_TABLET | ORAL | 0 refills | Status: DC

## 2018-05-16 NOTE — Telephone Encounter (Signed)
If she would like to change over from flecainide to amiodarone permanently and take amiodarone on a regular basis Would stop the flecainide Wait 3 days Friday would start amiodarone 400 twice daily for 5 days then down to 200 twice daily After 2 weeks would go down to 200 daily

## 2018-05-16 NOTE — Telephone Encounter (Signed)
Spoke with patient and reviewed recommendations regarding medications and review of the information that she faxed over. Reviewed instructions in detail but she was concerned because she didn't want to write anything down wrong. Advised that I would be happy to send her a MyChart message with direct instructions and if she should have any questions to please give [name] a call back. She verbalized understanding of our instructions with no further questions at this time.

## 2018-05-16 NOTE — Telephone Encounter (Signed)
Patient calling  Patient has sent a mychart message but was limited  Patient states she will also be faxing over vitals as she could not get it to send on mychart Patient updates that since the evisit she has been in afib twice for several hours - no falls  She did end up taking amiodarone even though she was to stop but it did seem to help Patient states that if she had to choose she would rather have the hand shaking symptom of the amiodarone than afib Please call to discuss

## 2018-05-18 ENCOUNTER — Telehealth: Payer: Self-pay | Admitting: Cardiovascular Disease

## 2018-05-18 ENCOUNTER — Other Ambulatory Visit: Payer: Self-pay | Admitting: *Deleted

## 2018-05-18 MED ORDER — AMIODARONE HCL 200 MG PO TABS: 200.0000 mg | ORAL_TABLET | Freq: Every day | ORAL | 1 refills | Status: DC

## 2018-05-18 MED ORDER — AMIODARONE HCL 200 MG PO TABS: ORAL_TABLET | ORAL | 0 refills | Status: DC

## 2018-05-18 NOTE — Telephone Encounter (Signed)
Patient calling Would like to check the status of amiodarone 200 MG loading dose  Patient states that [facility] has not received prescription Please advise

## 2018-05-18 NOTE — Telephone Encounter (Signed)
Please advise if ok to refill. Pt requesting Amiodarone 200 mg loading dose. I'm not sure if I need to send in new Rx looks like it was sent by Alvis Lemmings, RN and was successfully sent.

## 2018-05-18 NOTE — Telephone Encounter (Signed)
I spoke with the patient and advised her I sent in her loading dose & maintenance dose RX's for amiodarone to [facility].  She voiced understanding and was appreciative for the call back.  She also mentioned that she was off flecainide and her HR was 60 bpm today. She has stopped metoprolol and was advised she should be taking this earlier today.  I have advise her to check her HR prior to her PM dose tonight and if her HR is < 60 pm to hold metoprolol.  I have advised she keep a close watch on her HR when starting amiodarone and let us know if she has any problems taking this.  She voices understanding.

## 2018-05-18 NOTE — Telephone Encounter (Signed)
Returned call to patient. She reports many med change and had confusion regarding metoprolol. She held metoprolol.   Per [place] note, continue metoprolol and amiodarone.   Pt daughters concerned regarding higher loading dose. I made pt aware that this is standard when initiation of amiodarone, that starting doses are higher to achieve therapeutic levels in system.   Pt verbalized understanding and had no further questions at this time.   Advised pt to call for any further questions or concerns.

## 2018-06-07 ENCOUNTER — Telehealth: Payer: Self-pay | Admitting: Neurology

## 2018-06-07 NOTE — Telephone Encounter (Signed)
I called and spoke with the patient regarding changing their visit to a VV due to COVID-19. Consent was given and email was confirmed. I have sent an email with the doxy link attached. DW

## 2018-06-08 ENCOUNTER — Telehealth: Payer: Self-pay

## 2018-06-08 NOTE — Telephone Encounter (Signed)
Telehealth consent pending per mychart.

## 2018-06-09 DIAGNOSIS — H43811 Vitreous degeneration, right eye: Secondary | ICD-10-CM | POA: Diagnosis not present

## 2018-06-09 NOTE — Telephone Encounter (Signed)
Telehealth consent obtained through mychart.

## 2018-06-11 NOTE — Progress Notes (Signed)
Virtual Visit via Video Note   This visit type was conducted due to national recommendations for restrictions regarding the COVID-19 Pandemic (e.g. social distancing) in an effort to limit this patient's exposure and mitigate transmission in our community.  Due to her co-morbid illnesses, this patient is at least at moderate risk for complications without adequate follow up.  This format is felt to be most appropriate for this patient at this time.  All issues noted in this document were discussed and addressed.  A limited physical exam was performed with this format.  Please refer to the patient's chart for her consent to telehealth for [facility].    Date:  06/11/2018   ID:  Sonya Lopez, DOB 18-Sep-1955, MRN [id number]  Patient Location:  [place] [address]   Provider location:   [facility], [place] office  PCP:  Steele Sizer, MD  Cardiologist:  Patsy Baltimore  Chief Complaint: Syncope   History of Present Illness:    Sonya Lopez is a 63 y.o. female who presents via audio/video conferencing for a telehealth visit today.   The patient does not symptoms concerning for COVID-19 infection (fever, chills, cough, or new SHORTNESS OF BREATH).   Patient has a past medical history of Diabetes  GERD hospital admission in 04/16/2011 for atrial flutter with RVR.  CT coronary calcium score zero 09/2015 Father had significant history of coronary artery disease but was a heavy smoker She presents for routine followup of her atrial fibrillation and flutter  On her last clinic visit continued to have breakthrough atrial fibrillation on flecainide She was changed to amiodarone with initial load Initially with breakthrough, these have been improving  3 episodes of atrial fib 4/27 in the PM 5/5 1 Am 5/7  Has tele strips, confirming atrial fib Took propranolol Spells lasting 2 to 4 hours   132/55, pulse 51, supine 147/70,  pulse 52, sitting 144/70, pulse 58 standing Standing 3 min 126/73, pulse 58  On amiodarone once a day 200 mg daily Metoprolol 12.5 mg once  a day  Off flomax, no near syncope or syncope symptoms   Other past medical hx ER 05/06/18:  for atrial fib, started flecainide 4:30 AM Stood up out of bed, syncope, before flecainide x2 Hospital records reviewed with the patient in detail took 2 flecainide and a dose of propranolol.  After taking those medications patient had a syncopal event x2. She denies injuring herself. She woke up on the floor. -When EMS arrived patient was found to be hypotensive and orthostatics with systolics in the 32R - started on IV fluids -Patient opted for cardioversion.   Syncope was before the prorpanolol Atrial fib rate was 105-110    Prior CV studies:   The following studies were reviewed today:    Past Medical History:  Diagnosis Date  . Allergy   . Anxiety    situational anxiety  . Arrhythmia    A-fib/A-flutter  . Atrial flutter ([place])   . 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  MammoSite  . Cancer ([place]) 2017  . Depression   . Diet-controlled type 2 diabetes mellitus ([place])   . Difficult intubation 2009   [place] had to use  'boogee"  . Female cystocele 01/05/2011  . Fibromyalgia   . GERD (gastroesophageal reflux disease)   . Headache(784.0)   . Kidney stones   . Obesity   .  Personal history of radiation therapy 2017   LEFT lumpectomy  . PONV (postoperative nausea and vomiting)    h/o difficult intubation and anes had to use "boogee"   Past Surgical History:  Procedure Laterality Date  . ABDOMINAL HYSTERECTOMY  2013  . ANTERIOR AND POSTERIOR REPAIR  11/30/2011   Procedure: ANTERIOR (CYSTOCELE) AND POSTERIOR REPAIR (RECTOCELE);  Surgeon: Reece Packer, MD;  Location: [facility];  Service: Urology;  Laterality: N/A;  with cysto  . BLADDER SUSPENSION   11/30/2011   Procedure: [facility] PROCEDURE;  Surgeon: Reece Packer, MD;  Location: [facility];  Service: Urology;  Laterality: N/A;  graft 10x6  . BREAST BIOPSY Right 2007   negative core  . BREAST BIOPSY Left 09/16/2015   DCIS  . BREAST LUMPECTOMY Left 09/30/2015   DCIS clear margins and rad tx/HIGH GRADE DUCTAL CARCINOMA IN SITU, COMEDO TYPE  . BREAST LUMPECTOMY WITH NEEDLE LOCALIZATION Left 09/30/2015   DCIS excision, MammoSite radiation;  Surgeon: Robert Bellow, MD;  Location: [facility];  Service: General;  Laterality: Left;  . CESAREAN SECTION     x 1  . CHOLECYSTECTOMY  2005  . COLONOSCOPY  2009  . CYSTOSCOPY  11/30/2011   Procedure: CYSTOSCOPY;  Surgeon: Reece Packer, MD;  Location: [facility];  Service: Urology;;  . Brigitte Pulse AND CURETTAGE OF UTERUS  05/05/2007  . GALLBLADDER SURGERY  2004  . TONSILLECTOMY  1961  . VAGINAL HYSTERECTOMY  11/30/2011   Procedure: HYSTERECTOMY VAGINAL;  Surgeon: Emily Filbert, MD;  Location: [facility];  Service: Gynecology;  Laterality: N/A;     No outpatient medications have been marked as taking for the 06/12/18 encounter (Appointment) with [name], MD.     Allergies:   Adhesive [tape]; Levaquin  [levofloxacin]; Macrodantin [nitrofurantoin]; Metronidazole; Nitrofurantoin macrocrystal; Sulfamethoxazole-trimethoprim; Cephalexin; Clindamycin hcl; Doxycycline; and Erythromycin   Social History   Tobacco Use  . Smoking status: Never Smoker  . Smokeless tobacco: Never Used  Substance Use Topics  . Alcohol use: Not Currently    Alcohol/week: 0.0 standard drinks    Frequency: Never    Comment: Due to AF  . Drug use: No     Current Outpatient Medications on File Prior to Visit  Medication Sig Dispense Refill  . amiodarone (PACERONE) 200 MG tablet Take 2 tabs (400 mg) twice daily x 5 days, then take 1 tab (200 mg) twice daily x 14 days, then take 1 tab (200 mg) once daily 60 tablet 0  . amiodarone (PACERONE) 200 MG tablet Take 1 tablet  (200 mg total) by mouth daily. 90 tablet 1  . apixaban (ELIQUIS) 5 MG TABS tablet Take 1 tablet (5 mg total) by mouth 2 (two) times daily. 180 tablet 3  . Clindamycin Phosphate foam Apply 1 Units topically as needed. For rosacea    5  . DULoxetine (CYMBALTA) 60 MG capsule TAKE 1 CAPSULE BY MOUTH ONCE DAILY 90 capsule 1  . FINACEA 15 % FOAM     . gabapentin (NEURONTIN) 100 MG capsule Take 1 capsule (100 mg total) by mouth 2 (two) times daily. 180 capsule 1  . levocetirizine (XYZAL) 5 MG tablet Take 1 tablet (5 mg total) by mouth every evening. (Patient taking differently: Take 5 mg by mouth as needed. ) 30 tablet 0  . magnesium oxide (MAG-OX) 400 MG tablet Take 400 mg by mouth every morning.     . meloxicam (MOBIC) 15 MG tablet Take 15 mg by mouth as needed.     Marland Kitchen  metoprolol succinate (TOPROL-XL) 25 MG 24 hr tablet Take 0.5 tablets (12.5 mg total) by mouth daily. 45 tablet 3  . mometasone (NASONEX) 50 MCG/ACT nasal spray Place 2 sprays into the nose every morning.     . naratriptan (AMERGE) 2.5 MG tablet TAKE 1 TABLET BY MOUTH AT ONSET OF HEADACHE. MAY REPEAT AFTER 4 HOURSIF RETURNS OR DOES NOT RESOLVE MAX OF 2 TABS PER DAY. 9 tablet 5  . omeprazole (PRILOSEC) 20 MG capsule Take 1 capsule (20 mg total) by mouth daily. 90 capsule 1  . Probiotic Product (PROBIOTIC PO) Take 1 capsule by mouth daily.     . promethazine (PHENERGAN) 12.5 MG tablet Take 1 tablet (12.5 mg total) by mouth every 8 (eight) hours as needed for nausea or vomiting. 10 tablet 0  . propranolol (INDERAL) 10 MG tablet Take 1 tablet (10 mg) by mouth once daily as needed when taking extra flecainide for breakthrough a-fib 30 tablet 3  . tamsulosin (FLOMAX) 0.4 MG CAPS capsule Take 2 capsules (0.8 mg total) by mouth daily after breakfast. 90 capsule 3  . triamcinolone cream (KENALOG) 0.1 % Apply 1 application topically 2 (two) times daily. 30 g 0  . [DISCONTINUED] flecainide (TAMBOCOR) 100 MG tablet Take 1 tablet (100 mg total) by mouth  2 (two) times daily. 180 tablet 3   No current facility-administered medications on file prior to visit.      Family Hx: [name] family history includes Asthma in her daughter, daughter, and daughter; Colitis in her daughter; Diabetes in her brother and mother; Fibromyalgia in her daughter; Hearing loss in her mother; Heart disease in her father and paternal grandfather; Hypertension in her brother and mother; Interstitial cystitis in her daughter; Migraines in her daughter; Thyroid cancer (age of onset: 79) in her daughter. There is no history of Breast cancer.  ROS:   Please see the history of present illness.    Review of Systems  Constitutional: Negative.   HENT: Negative.   Respiratory: Negative.   Cardiovascular: Negative.        Rare tachycardia episodes  Gastrointestinal: Negative.   Musculoskeletal: Negative.   Neurological: Negative.   Psychiatric/Behavioral: Negative.   All other systems reviewed and are negative.     Labs/Other Tests and Data Reviewed:    Recent Labs: 03/02/2018: ALT 16 05/06/2018: BUN 23; Creatinine, Ser 0.87; Hemoglobin 14.9; Magnesium 2.1; Platelets 337; Potassium 4.3; Sodium 137   Recent Lipid Panel Lab Results  Component Value Date/Time   CHOL 186 03/02/2018 10:00 AM   CHOL 209 (H) 06/04/2015 07:53 AM   CHOL 164 04/17/2011 05:07 AM   TRIG 87 03/02/2018 10:00 AM   TRIG 83 04/17/2011 05:07 AM   HDL 60 03/02/2018 10:00 AM   HDL 58 06/04/2015 07:53 AM   HDL 43 04/17/2011 05:07 AM   CHOLHDL 3.1 03/02/2018 10:00 AM   LDLCALC 108 (H) 03/02/2018 10:00 AM   LDLCALC 104 (H) 04/17/2011 05:07 AM    Wt Readings from Last 3 Encounters:  05/06/18 205 lb (93 kg)  03/13/18 205 lb (93 kg)  03/13/18 205 lb (93 kg)     Exam:    132/55, pulse 51, supine 147/70, pulse 52, sitting 144/70, pulse 58 standing Standing 3 min 126/73, pulse 58  Well nourished, well developed female in no acute distress. Constitutional:  oriented to person, place,  and time. No distress.  Head: Normocephalic and atraumatic.  Eyes:  no discharge. No scleral icterus.  Neck: Normal range of motion. Neck  supple.  Pulmonary/Chest: No audible wheezing, no distress, appears comfortable Musculoskeletal: Normal range of motion.  no  tenderness or deformity.  Neurological:   Coordination normal. Full exam not performed Skin:  No rash Psychiatric:  normal mood and affect. behavior is normal. Thought content normal.    ASSESSMENT & PLAN:    Atrial fibrillation/ flutter, unspecified type (HCC) Previously did not hold on flecainide She has gone back on amiodarone with improved results Denies any side effects concerning for shaking and tremor  She is taking 200 mg daily with occasional breakthrough Recommended for her periodic breakthrough she take propranolol 10 mg and extra amiodarone Amiodarone surveillance labs to be done through primary care later this summer  Syncope Suspect secondary to orthostasis She has held her Flomax  She is checking orthostatics at home, still with 140 down to 100 3:20 minutes but asymptomatic  Hyperlipidemia  Total cholesterol increased to 186.  She is trying to watch her weight, walking program   COVID-19 Education: The signs and symptoms of COVID-19 were discussed with the patient and how to seek care for testing (follow up with PCP or arrange E-visit).  The importance of social distancing was discussed today.  Patient Risk:   After full review of this patients clinical status, I feel that they are at least moderate risk at this time.  Time:   Today, I have spent 25 minutes with the patient with telehealth technology discussing .     Medication Adjustments/Labs and Tests Ordered: Current medicines are reviewed at length with the patient today.  Concerns regarding medicines are outlined above.   Tests Ordered: No tests ordered   Medication Changes: No changes made   Disposition: Follow-up in 6 months    Signed, Ida Rogue, MD  06/11/2018 2:23 PM    [place] [facility] [address] [place] [address], [place], [place] [address]

## 2018-06-12 ENCOUNTER — Other Ambulatory Visit: Payer: Self-pay

## 2018-06-12 ENCOUNTER — Telehealth (INDEPENDENT_AMBULATORY_CARE_PROVIDER_SITE_OTHER): Payer: Medicare Other | Admitting: Cardiovascular Disease

## 2018-06-12 DIAGNOSIS — I480 Paroxysmal atrial fibrillation: Secondary | ICD-10-CM | POA: Diagnosis not present

## 2018-06-12 DIAGNOSIS — R55 Syncope and collapse: Secondary | ICD-10-CM | POA: Diagnosis not present

## 2018-06-12 DIAGNOSIS — E119 Type 2 diabetes mellitus without complications: Secondary | ICD-10-CM | POA: Diagnosis not present

## 2018-06-12 DIAGNOSIS — I483 Typical atrial flutter: Secondary | ICD-10-CM

## 2018-06-12 DIAGNOSIS — E782 Mixed hyperlipidemia: Secondary | ICD-10-CM | POA: Diagnosis not present

## 2018-06-12 NOTE — Patient Instructions (Addendum)
Medication Instructions:  Your physician has recommended you make the following change in your medication:  1. CONTINUE Amiodarone 200 mg once daily 2. AS NEEDED for breakthrough episodes of atrial fibrillation take Propranolol 10 mg  If you need a refill on your cardiac medications before your next appointment, please call your pharmacy.    Lab work: No new labs needed   If you have labs (blood work) drawn today and your tests are completely normal, you will receive your results only by: Marland Kitchen MyChart Message (if you have MyChart) OR . A paper copy in the mail If you have any lab test that is abnormal or we need to change your treatment, we will call you to review the results.   Testing/Procedures: No new testing needed   Follow-Up: At [facility], you and your health needs are our priority.  As part of our continuing mission to provide you with exceptional heart care, we have created designated Provider Care Teams.  These Care Teams include your primary Cardiologist (physician) and Advanced Practice Providers (APPs -  Physician Assistants and Nurse Practitioners) who all work together to provide you with the care you need, when you need it.  . You will need a follow up appointment in 6 months .   Please call our office 2 months in advance to schedule this appointment.    . Providers on your designated Care Team:   . Murray Hodgkins, NP . Christell Faith, PA-C . Marrianne Mood, PA-C  Any Other Special Instructions Will Be Listed Below (If Applicable).  For educational health videos Log in to : [email or web address] Or : [email or web address], password : [email or web address]

## 2018-06-19 ENCOUNTER — Encounter: Payer: Self-pay | Admitting: Neurology

## 2018-06-19 ENCOUNTER — Ambulatory Visit (INDEPENDENT_AMBULATORY_CARE_PROVIDER_SITE_OTHER): Payer: Medicare Other | Admitting: Neurology

## 2018-06-19 ENCOUNTER — Other Ambulatory Visit: Payer: Self-pay

## 2018-06-19 VITALS — BP 132/70 | HR 59 | Temp 96.8°F | Ht 66.0 in | Wt 207.0 lb

## 2018-06-19 DIAGNOSIS — M48062 Spinal stenosis, lumbar region with neurogenic claudication: Secondary | ICD-10-CM | POA: Diagnosis not present

## 2018-06-19 DIAGNOSIS — G43711 Chronic migraine without aura, intractable, with status migrainosus: Secondary | ICD-10-CM | POA: Diagnosis not present

## 2018-06-19 DIAGNOSIS — M5416 Radiculopathy, lumbar region: Secondary | ICD-10-CM | POA: Diagnosis not present

## 2018-06-19 DIAGNOSIS — D8989 Other specified disorders involving the immune mechanism, not elsewhere classified: Secondary | ICD-10-CM | POA: Diagnosis not present

## 2018-06-19 DIAGNOSIS — R51 Headache: Secondary | ICD-10-CM

## 2018-06-19 DIAGNOSIS — R200 Anesthesia of skin: Secondary | ICD-10-CM

## 2018-06-19 DIAGNOSIS — G4719 Other hypersomnia: Secondary | ICD-10-CM

## 2018-06-19 DIAGNOSIS — R519 Headache, unspecified: Secondary | ICD-10-CM

## 2018-06-19 DIAGNOSIS — M542 Cervicalgia: Secondary | ICD-10-CM

## 2018-06-19 DIAGNOSIS — M797 Fibromyalgia: Secondary | ICD-10-CM

## 2018-06-19 DIAGNOSIS — M2550 Pain in unspecified joint: Secondary | ICD-10-CM | POA: Diagnosis not present

## 2018-06-19 DIAGNOSIS — R29898 Other symptoms and signs involving the musculoskeletal system: Secondary | ICD-10-CM | POA: Diagnosis not present

## 2018-06-19 DIAGNOSIS — M7918 Myalgia, other site: Secondary | ICD-10-CM | POA: Diagnosis not present

## 2018-06-19 DIAGNOSIS — R21 Rash and other nonspecific skin eruption: Secondary | ICD-10-CM | POA: Diagnosis not present

## 2018-06-19 DIAGNOSIS — R5383 Other fatigue: Secondary | ICD-10-CM

## 2018-06-19 NOTE — Patient Instructions (Addendum)
MRI lumbar spine with Xanax Dry needling of the cervical muscles. [place] May need emg/ncs in the future, palm pain may be overuse, consider wrist splints when typing a lot or with symptoms and will follow clinically.  Sleep evaluation: [address], in [place] went to sleep test. Will find sleep test. Referral to our sleep doctor. Dr. Brett Fairy.  Lab work to rule out other causes of symptoms Migraines: Ajovy; Once monthly or 3 shots every 3 months.'  Fremanezumab injection What is this medicine? FREMANEZUMAB (fre ma NEZ ue mab) is used to prevent migraine headaches. This medicine may be used for other purposes; ask your health care provider or pharmacist if you have questions. COMMON BRAND NAME(S): AJOVY What should I tell my health care provider before I take this medicine? They need to know if you have any of these conditions: -an unusual or allergic reaction to fremanezumab, other medicines, foods, dyes, or preservatives -pregnant or trying to get pregnant -breast-feeding How should I use this medicine? This medicine is for injection under the skin. You will be taught how to prepare and give this medicine. Use exactly as directed. Take your medicine at regular intervals. Do not take your medicine more often than directed. It is important that you put your used needles and syringes in a special sharps container. Do not put them in a trash can. If you do not have a sharps container, call your pharmacist or healthcare provider to get one. Talk to your pediatrician regarding the use of this medicine in children. Special care may be needed. Overdosage: If you think you have taken too much of this medicine contact a poison control center or emergency room at once. NOTE: This medicine is only for you. Do not share this medicine with others. What if I miss a dose? If you miss a dose, take it as soon as you can. If it is almost time for your next dose, take only that dose. Do not take  double or extra doses. What may interact with this medicine? Interactions are not expected. This list may not describe all possible interactions. Give your health care provider a list of all the medicines, herbs, non-prescription drugs, or dietary supplements you use. Also tell them if you smoke, drink alcohol, or use illegal drugs. Some items may interact with your medicine. What should I watch for while using this medicine? Tell your doctor or healthcare professional if your symptoms do not start to get better or if they get worse. What side effects may I notice from receiving this medicine? Side effects that you should report to your doctor or health care professional as soon as possible: -allergic reactions like skin rash, itching or hives, swelling of the face, lips, or tongue Side effects that usually do not require medical attention (report these to your doctor or health care professional if they continue or are bothersome): -pain, redness, or irritation at site where injected This list may not describe all possible side effects. Call your doctor for medical advice about side effects. You may report side effects to FDA at 1-800-FDA-1088. Where should I keep my medicine? Keep out of the reach of children. You will be instructed on how to store this medicine. Throw away any unused medicine after the expiration date on the label. NOTE: This sheet is a summary. It may not cover all possible information. If you have questions about this medicine, talk to your doctor, pharmacist, or health care provider.  2019 Elsevier/Gold Standard (2016-10-18 17:22:56)

## 2018-06-19 NOTE — Progress Notes (Signed)
[facility]    Provider:  Dr Jaynee Eagles Requesting Provider: Steele Sizer, MD Primary Care Provider:  Steele Sizer, MD  CC:  Back pain, migraines  HPI:  Sonya Lopez is a lovely 63 y.o. female here as requested by Steele Sizer, MD for migraines. PMHx migraines, lumbar radiculopathy, obesity, kidney stones, fibromyalgia, atrial fibrillation on long-term anticoagulation, diet-controlled diabetes 2, depression, breast neoplasm, anxiety.  Symptoms started in 1993. Bending over in the garden and felt a sharp pain in the left buttocks and hips. Worsening since then. Diagnosed with bursitis and lumbar radic. She always has pain down the left leg. Worse bending over to clean bathtub. Starts in the buttocks and has pain in the buttocks (points to the bursa) but radiates down the back of the leg and tender in the lateral thigh. She has been to orthopaedics. She had physical therapy which helped the right side which started hurting but the left side never improved. She went back to orthopaedics. It aches all the time. Shooting pain bending over, when she walks a long time, she has sit down after walking a certain distance to rest. Resting and sitting helps, but sitting too long makes it worse. Getting up and walking helps if she doesn't go too far. She has paresthesias in the left lower leg. She also feels weakness in her left leg, her leg will "give out" slightly not enough to fall but weakness and sititing down helps. Weakness worse going up the steps.   She has left-side pain in the arm as well. She has tightness in the cervical areas. A lot of tightness. She has neck pain but doesn't c/o of radicular pain. And this hurts with the migraines as well. Discussed dry needling.She has tenderness at the base of the ventral palms. No numbness or tingling, no weakness. Worse with typing a lot during the day, no nocturnal awakenings.Consider wrist splints.   Migraines: She has a history  of migraines. She has a headache 18 days of the month. 8-10 migraine days a month. She uses a triptan which helps. She takes amerge. She sometimes has to take a second. Some it doesn't. Migraines are on the left side, behind the eye sna temple, radiates to the left side of the head and into the left cervical muscles. Migrianes are oderately severe or severe, hurts so badly she can;t keep her eye open, nausea, no vomiting, light and sound sensitivity. movement makes it worse, ice helps. Unilateral, usually on the left but can be on the right. Ongoing at this frequency for > 10 years. Even since the 70s. She has just lived with it. In the last 10 years they are better than they were but still affecting life and can be very bad. She can't move, lay in a dark room. No new vision changes. She wakes up with headache in the morning very often. She has afib, she snore sometimes, really fatigued, never wakes feeling rested, she wakes frequently, fatigued all day. She was diagnosed with fibromyalgia in 12/2000. She had pressure points, pain, sore, burning, difficulty concentrating, fatigue, tinging in the left toe and has some itching in the toe. She has had rashes on her feet and upper legs, she has joint pain in different areas, her fingers get sore. She has dry mouth, no color changes I the fingers, she gets blisters on her tongue a lot and inner cheeks.   She feels her word-finding skills are impaired. She has been intelligent, she works hard, she was  evaluated in the past for skipping grades growing up. She feels the word finding. It will eventually. She is tired. She has mild depression. She has been taking care of everyone else, her daughter, her mother, hr grandchild and she is tired and worn out. She taking care of herself more, feels it is getting better.   Meds tried: lyrica, Cymbalta, gabapentin, metoprolol, amerge, imipamine, propranolol, venlafaxine  Reviewed notes, labs and imaging from outside  physicians, which showed:  Ct showed No acute intracranial abnormalities including mass lesion or mass effect, hydrocephalus, extra-axial fluid collection, midline shift, hemorrhage, or acute infarction, large ischemic events (personally reviewed images) 02/2018 ldl 108 hgba1c 5.5 Creating, BUN normal  02/2017: B12 643 Vit D 35 HgbA1c 6.4 TSH nml  Review of Systems: Patient complains of symptoms per HPI as well as the following symptoms: back pain, fibromyalgia, fatigue. Pertinent negatives and positives per HPI. All others negative.   Social History   Socioeconomic History  . Marital status: Married    Spouse name: Environmental consultant   . Number of children: 3  . Years of education: 16  . Highest education level: Bachelor's degree (e.g., BA, AB, BS)  Occupational History  . Not on file  Social Needs  . Financial resource strain: Not hard at all  . Food insecurity:    Worry: Never true    Inability: Never true  . Transportation needs:    Medical: No    Non-medical: No  Tobacco Use  . Smoking status: Never Smoker  . Smokeless tobacco: Never Used  Substance and Sexual Activity  . Alcohol use: Not Currently    Alcohol/week: 0.0 standard drinks    Frequency: Never    Comment: Due to AF  . Drug use: No  . Sexual activity: Yes    Partners: Male    Birth control/protection: Surgical  Lifestyle  . Physical activity:    Days per week: 3 days    Minutes per session: 40 min  . Stress: To some extent  Relationships  . Social connections:    Talks on phone: More than three times a week    Gets together: More than three times a week    Attends religious service: More than 4 times per year    Active member of club or organization: Yes    Attends meetings of clubs or organizations: More than 4 times per year    Relationship status: Married  . Intimate partner violence:    Fear of current or ex partner: No    Emotionally abused: No    Physically abused: No    Forced sexual activity:  No  Other Topics Concern  . Not on file  Social History Narrative   Lives at home with husband and one daughter   Right handed   Caffeine: 1 cup daily    Family History  Problem Relation Age of Onset  . Diabetes Mother   . Hearing loss Mother   . Hypertension Mother   . Heart disease Father   . Heart disease Paternal Grandfather   . Asthma Daughter   . Asthma Daughter   . Migraines Daughter   . Fibromyalgia Daughter   . Interstitial cystitis Daughter   . Asthma Daughter   . Colitis Daughter   . Thyroid cancer Daughter 57  . Diabetes Brother   . Hypertension Brother   . Breast cancer Neg Hx     Past Medical History:  Diagnosis Date  . Allergy   . Anxiety  situational anxiety  . Arrhythmia    A-fib/A-flutter  . Atrial flutter ([place])   . 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  MammoSite  . Cancer ([place]) 2017  . Depression   . Diet-controlled type 2 diabetes mellitus ([place])   . Difficult intubation 2009   [place] had to use  'boogee"  . Female cystocele 01/05/2011  . Fibromyalgia   . GERD (gastroesophageal reflux disease)   . Headache(784.0)   . Kidney stones   . Obesity   . Personal history of radiation therapy 2017   LEFT lumpectomy  . PONV (postoperative nausea and vomiting)    h/o difficult intubation and anes had to use "boogee"  . Vitreous detachment of right eye    sees floaters    Patient Active Problem List   Diagnosis Date Noted  . Chronic migraine without aura, with intractable migraine, so stated, with status migrainosus 06/20/2018  . Syncope 05/10/2018  . Pain, elbow joint 02/02/2018  . PAF (paroxysmal atrial fibrillation) ([place]) 09/13/2017  . Trochanteric bursitis of left hip 06/24/2017  . Low back pain 02/22/2017  . Benign breast cyst in female, right 05/25/2016  . History of left breast cancer 09/16/2015  . Long-term use of high-risk medication  02/25/2015  . Increased urinary frequency 08/01/2014  . Mild major depression ([place]) 07/29/2014  . Gastric reflux 07/29/2014  . Chronic interstitial cystitis 07/29/2014  . Calculus of kidney 07/29/2014  . Allergic rhinitis 07/29/2014  . Acne erythematosa 07/29/2014  . Lumbosacral radiculitis 07/29/2014  . Tinnitus 07/29/2014  . Mixed incontinence, urge and stress (female) (female) 08/23/2013  . Hyperlipidemia 04/25/2013  . H/O: hysterectomy 11/30/2011  . Atrial flutter ([place]) 05/03/2011  . Diet-controlled type 2 diabetes mellitus ([place]) 04/16/2011  . Bladder cystocele 01/05/2011  . Migraine without aura and without status migrainosus, not intractable 12/29/2010  . Fibromyalgia 12/29/2010  . Obesity 12/29/2010  . Migraine 12/29/2010    Past Surgical History:  Procedure Laterality Date  . ABDOMINAL HYSTERECTOMY  2013  . ANTERIOR AND POSTERIOR REPAIR  11/30/2011   Procedure: ANTERIOR (CYSTOCELE) AND POSTERIOR REPAIR (RECTOCELE);  Surgeon: Reece Packer, MD;  Location: [facility];  Service: Urology;  Laterality: N/A;  with cysto  . BLADDER SUSPENSION  11/30/2011   Procedure: [facility] PROCEDURE;  Surgeon: Reece Packer, MD;  Location: [facility];  Service: Urology;  Laterality: N/A;  graft 10x6  . BREAST BIOPSY Right 2007   negative core  . BREAST BIOPSY Left 09/16/2015   DCIS  . BREAST LUMPECTOMY Left 09/30/2015   DCIS clear margins and rad tx/HIGH GRADE DUCTAL CARCINOMA IN SITU, COMEDO TYPE  . BREAST LUMPECTOMY WITH NEEDLE LOCALIZATION Left 09/30/2015   DCIS excision, MammoSite radiation;  Surgeon: Robert Bellow, MD;  Location: [facility];  Service: General;  Laterality: Left;  . CESAREAN SECTION     x 1  . CHOLECYSTECTOMY  2005  . COLONOSCOPY  2009  . CYSTOSCOPY  11/30/2011   Procedure: CYSTOSCOPY;  Surgeon: Reece Packer, MD;  Location: [facility];  Service: Urology;;  . Brigitte Pulse AND CURETTAGE OF UTERUS  05/05/2007  . GALLBLADDER SURGERY  2004  . TONSILLECTOMY  1961  . VAGINAL  HYSTERECTOMY  11/30/2011   Procedure: HYSTERECTOMY VAGINAL;  Surgeon: Emily Filbert, MD;  Location: [facility];  Service: Gynecology;  Laterality: N/A;    Current Outpatient Medications  Medication Sig Dispense Refill  . amiodarone (PACERONE) 200 MG tablet  Take 2 tabs (400 mg) twice daily x 5 days, then take 1 tab (200 mg) twice daily x 14 days, then take 1 tab (200 mg) once daily 60 tablet 0  . amiodarone (PACERONE) 200 MG tablet Take 1 tablet (200 mg total) by mouth daily. 90 tablet 1  . apixaban (ELIQUIS) 5 MG TABS tablet Take 1 tablet (5 mg total) by mouth 2 (two) times daily. 180 tablet 3  . Clindamycin Phosphate foam Apply 1 Units topically as needed. For rosacea    5  . DULoxetine (CYMBALTA) 60 MG capsule TAKE 1 CAPSULE BY MOUTH ONCE DAILY 90 capsule 1  . FINACEA 15 % FOAM     . gabapentin (NEURONTIN) 100 MG capsule Take 1 capsule (100 mg total) by mouth 2 (two) times daily. 180 capsule 1  . levocetirizine (XYZAL) 5 MG tablet Take 1 tablet (5 mg total) by mouth every evening. (Patient taking differently: Take 5 mg by mouth as needed. ) 30 tablet 0  . magnesium oxide (MAG-OX) 400 MG tablet Take 400 mg by mouth every morning.     . meloxicam (MOBIC) 15 MG tablet Take 15 mg by mouth as needed.     . metoprolol succinate (TOPROL-XL) 25 MG 24 hr tablet Take 0.5 tablets (12.5 mg total) by mouth daily. 45 tablet 3  . mometasone (NASONEX) 50 MCG/ACT nasal spray Place 2 sprays into the nose every morning.     . naratriptan (AMERGE) 2.5 MG tablet TAKE 1 TABLET BY MOUTH AT ONSET OF HEADACHE. MAY REPEAT AFTER 4 HOURSIF RETURNS OR DOES NOT RESOLVE MAX OF 2 TABS PER DAY. 9 tablet 5  . omeprazole (PRILOSEC) 20 MG capsule Take 1 capsule (20 mg total) by mouth daily. 90 capsule 1  . Probiotic Product (PROBIOTIC PO) Take 1 capsule by mouth daily.     . propranolol (INDERAL) 10 MG tablet Take 1 tablet (10 mg) by mouth once daily as needed when taking extra flecainide for breakthrough a-fib (Patient taking  differently: Take 1 tablet (10 mg) by mouth once daily as needed when taking extra amiodarone for breakthrough a-fib) 30 tablet 3  . ALPRAZolam (XANAX) 0.25 MG tablet Take 1-2 tabs (0.71m-0.50mg) 30-60 minutes before procedure. May repeat if needed.Do not drive. 15 tablet 0  . Fremanezumab-vfrm (AJOVY) 225 MG/1.5ML SOSY Inject 225 mg into the skin every 30 (thirty) days. 6 Syringe 0   No current facility-administered medications for this visit.     Allergies as of 06/19/2018 - Review Complete 06/19/2018  Allergen Reaction Noted  . Adhesive [tape] Dermatitis 11/23/2011  . Levaquin  [levofloxacin]  07/29/2014  . Macrodantin [nitrofurantoin] Nausea And Vomiting 11/15/2011  . Metronidazole Nausea Only 07/29/2014  . Nitrofurantoin macrocrystal Nausea And Vomiting 01/31/2015  . Sulfamethoxazole-trimethoprim Nausea Only 07/29/2014  . Cephalexin Rash 12/28/2010  . Clindamycin hcl Rash 01/31/2015  . Doxycycline Swelling and Rash 12/28/2010  . Erythromycin Rash 11/15/2011    Vitals: BP 132/70 (BP Location: Right Arm, Patient Position: Sitting)   Pulse (!) 59   Temp (!) 96.8 F (36 C) Comment: taken by front staff upon arrival  Ht '5\' 6"'  (1.676 m)   Wt 207 lb (93.9 kg)   BMI 33.41 kg/m  Last Weight:  Wt Readings from Last 1 Encounters:  06/19/18 207 lb (93.9 kg)   Last Height:   Ht Readings from Last 1 Encounters:  06/19/18 '5\' 6"'  (1.676 m)     Physical exam: Exam: Gen: NAD, conversant, well nourised, obese, well groomed  CV: RRR, no MRG. No Carotid Bruits. No peripheral edema, warm, nontender Eyes: Conjunctivae clear without exudates or hemorrhage  Neuro: Detailed Neurologic Exam  Speech:    Speech is normal; fluent and spontaneous with normal comprehension.  Cognition:    The patient is oriented to person, place, and time;     recent and remote memory intact;     language fluent;     normal attention, concentration,     fund of knowledge Cranial  Nerves:    The pupils are equal, round, and reactive to light. The fundi are normal and spontaneous venous pulsations are present. Visual fields are full to finger confrontation. Extraocular movements are intact. Trigeminal sensation is intact and the muscles of mastication are normal. The face is symmetric. The palate elevates in the midline. Hearing intact. Voice is normal. Shoulder shrug is normal. The tongue has normal motion without fasciculations.   Coordination:    Normal finger to nose and heel to shin. Normal rapid alternating movements.   Gait:    Heel-toe and tandem gait are normal.   Motor Observation:    No asymmetry, no atrophy, and no involuntary movements noted. Tone:    Normal muscle tone.    Posture:    Posture is normal. normal erect    Strength:    Strength is V/V in the upper and lower limbs.      Sensation: intact to LT     Reflex Exam:  DTR's:    Deep tendon reflexes in the upper and lower extremities are normal bilaterally.   Toes:    The toes are downgoing bilaterally.   Clonus:    Clonus is absent.    Assessment/Plan:  Sonya Lopez is a lovely 63 y.o. female here as requested by Steele Sizer, MD for migraines. PMHx migraines, lumbar radiculopathy, obesity, kidney stones, fibromyalgia, atrial fibrillation on long-term anticoagulation, diet-controlled diabetes 2, depression, breast neoplasm, anxiety.   - Low back pain, radiculopathy and neurogenic claudication, worsening and progressive: Doing lumbar radiculopathy, weakness of the leg, paresthesias, neurogenic claudication needs MRI of the lumbar spine for evaluation of surgical interventions such as decompressive surgery.  She is failed conservative therapy, physical therapy, has been seen by orthopedics and treating her pain conservatively for several years.  Patient has worsened MRI lumbar spine with Xanax  - For her cervical myofascial pain syndrome:  Dry needling of the cervical muscles.  [employer]. DRY NEEDLING: Patient has cervical myofascial pain syndrome, cervicalgia causing disability, pain, decreased range of motion, and contributing to migraines.  -Hand pain: May need emg/ncs in the future, may be overuse, consider wrist splints when typing a lot or with symptoms and will follow clinically.   -Patient had an at-home sleep evaluation > 3 years ago, can't remember where but went to [address], in [place]. unknown results.  However patient reports significant fatigue, ESS of 11, morning headaches, frequent awakenings, snoring, she needs a sleep evaluation for obstructive sleep apnea. She would like an in-lab test.   - Lab work to rule out other causes of symptoms  -I had a long discussion about migraines, acute and preventative medications, she prefers not to start more medications due to polypharmacy, she is also failed multiple medications in the past, can consider Botox but at this time will start CGRP medications.  Migraines: Ajovy; Once monthly or 3 shots every 3 months.   Orders Placed This Encounter  Procedures  . MR LUMBAR SPINE WO CONTRAST  . B. burgdorfi  Antibody  . ANA,IFA RA Diag Pnl w/rflx Tit/Patn  . Sedimentation rate  . Sjogren's syndrome antibods(ssa + ssb)  . Rheumatoid factor  . Heavy metals, blood  . Vitamin B6  . Multiple Myeloma Panel (SPEP&IFE w/QIG)  . Vitamin B1  . Ambulatory referral to Physical Therapy  . Ambulatory referral to Sleep Studies   Meds ordered this encounter  Medications  . ALPRAZolam (XANAX) 0.25 MG tablet    Sig: Take 1-2 tabs (0.81m-0.50mg) 30-60 minutes before procedure. May repeat if needed.Do not drive.    Dispense:  15 tablet    Refill:  0  . Fremanezumab-vfrm (AJOVY) 225 MG/1.5ML SOSY    Sig: Inject 225 mg into the skin every 30 (thirty) days.    Dispense:  6 Syringe    Refill:  0    Cc: SSteele Sizer MD   A total of 90 minutes was spent face-to-face with this patient. Over half this time was  spent on counseling patient on the  1. Chronic migraine without aura, with intractable migraine, so stated, with status migrainosus   2. Arthralgia, unspecified joint   3. Lumbar stenosis with neurogenic claudication   4. Cervical myofascial pain syndrome   5. Cervicalgia   6. Excessive daytime sleepiness   7. Morning headache   8. Fibromyalgia   9. Lumbar radiculopathy   10. Rash   11. Autoimmune disorder ([place]   12. Left leg weakness   13. Left leg numbness   14. Fatigue, unspecified type    diagnosis and different diagnostic and therapeutic options, counseling and coordination of care, risks ans benefits of management, compliance, or risk factor reduction and education.     ASarina Ill MD  [facility] [address]SSt[address] ClairGCampanilla Wallace [id number] Phone [phone] [phone]

## 2018-06-20 ENCOUNTER — Ambulatory Visit: Payer: Self-pay | Admitting: Neurology

## 2018-06-20 ENCOUNTER — Encounter: Payer: Self-pay | Admitting: Neurology

## 2018-06-20 DIAGNOSIS — G43711 Chronic migraine without aura, intractable, with status migrainosus: Secondary | ICD-10-CM | POA: Insufficient documentation

## 2018-06-20 MED ORDER — FREMANEZUMAB-VFRM 225 MG/1.5ML ~~LOC~~ SOSY: 225.0000 mg | PREFILLED_SYRINGE | SUBCUTANEOUS | 0 refills | Status: DC

## 2018-06-20 MED ORDER — ALPRAZOLAM 0.25 MG PO TABS: ORAL_TABLET | ORAL | 0 refills | Status: DC

## 2018-06-21 ENCOUNTER — Telehealth: Payer: Self-pay | Admitting: Neurology

## 2018-06-21 NOTE — Telephone Encounter (Signed)
Medicare/Everst order sent to GI. They will reach out to the pt to schedule.

## 2018-06-23 LAB — MULTIPLE MYELOMA PANEL, SERUM
Albumin SerPl Elph-Mcnc: 3.6 g/dL (ref 2.9–4.4)
Albumin/Glob SerPl: 1.3 (ref 0.7–1.7)
Alpha 1: 0.3 g/dL (ref 0.0–0.4)
Alpha2 Glob SerPl Elph-Mcnc: 0.8 g/dL (ref 0.4–1.0)
B-Globulin SerPl Elph-Mcnc: 1 g/dL (ref 0.7–1.3)
Gamma Glob SerPl Elph-Mcnc: 0.7 g/dL (ref 0.4–1.8)
Globulin, Total: 2.9 g/dL (ref 2.2–3.9)
IgA/Immunoglobulin A, Serum: 56 mg/dL — ABNORMAL LOW (ref 87–352)
IgG (Immunoglobin G), Serum: 874 mg/dL (ref 586–1602)
IgM (Immunoglobulin M), Srm: 64 mg/dL (ref 26–217)
Total Protein: 6.5 g/dL (ref 6.0–8.5)

## 2018-06-23 LAB — HEAVY METALS, BLOOD
Arsenic: 6 ug/L (ref 2–23)
Lead, Blood: NOT DETECTED ug/dL (ref 0–4)
Mercury: NOT DETECTED ug/L (ref 0.0–14.9)

## 2018-06-23 LAB — SJOGREN'S SYNDROME ANTIBODS(SSA + SSB)
ENA SSA (RO) Ab: <0.2 AI (ref 0.0–0.9)
ENA SSB (LA) Ab: <0.2 AI (ref 0.0–0.9)

## 2018-06-23 LAB — ANA,IFA RA DIAG PNL W/RFLX TIT/PATN
ANA Titer 1: NEGATIVE
Cyclic Citrullin Peptide Ab: 31 units — ABNORMAL HIGH (ref 0–19)
Rhuematoid fact SerPl-aCnc: <10 IU/mL (ref 0.0–13.9)

## 2018-06-23 LAB — B. BURGDORFI ANTIBODIES: Lyme IgG/IgM Ab: <0.91 {ISR} (ref 0.00–0.90)

## 2018-06-23 LAB — VITAMIN B6: Vitamin B6: 6.2 ug/L (ref 2.0–32.8)

## 2018-06-23 LAB — VITAMIN B1: Thiamine: 143.4 nmol/L (ref 66.5–200.0)

## 2018-06-23 LAB — SEDIMENTATION RATE: Sed Rate: 15 mm/hr (ref 0–40)

## 2018-06-27 ENCOUNTER — Other Ambulatory Visit: Payer: Self-pay | Admitting: Family Medicine

## 2018-06-27 ENCOUNTER — Telehealth: Payer: Self-pay | Admitting: Neurology

## 2018-06-27 DIAGNOSIS — K219 Gastro-esophageal reflux disease without esophagitis: Secondary | ICD-10-CM

## 2018-06-27 NOTE — Telephone Encounter (Signed)

## 2018-07-04 ENCOUNTER — Encounter: Payer: Self-pay | Admitting: Neurology

## 2018-07-05 ENCOUNTER — Other Ambulatory Visit: Payer: Self-pay | Admitting: Family Medicine

## 2018-07-05 ENCOUNTER — Other Ambulatory Visit: Payer: Self-pay | Admitting: Cardiovascular Disease

## 2018-07-05 ENCOUNTER — Encounter: Payer: Self-pay | Admitting: Family Medicine

## 2018-07-05 DIAGNOSIS — F320 Major depressive disorder, single episode, mild: Secondary | ICD-10-CM

## 2018-07-05 MED ORDER — DULOXETINE HCL 40 MG PO CPEP: 40.0000 mg | ORAL_CAPSULE | Freq: Every day | ORAL | 0 refills | Status: DC

## 2018-07-06 ENCOUNTER — Other Ambulatory Visit: Payer: Self-pay

## 2018-07-06 ENCOUNTER — Ambulatory Visit (INDEPENDENT_AMBULATORY_CARE_PROVIDER_SITE_OTHER): Payer: Medicare Other | Admitting: Neurology

## 2018-07-06 ENCOUNTER — Encounter: Payer: Self-pay | Admitting: Neurology

## 2018-07-06 DIAGNOSIS — E119 Type 2 diabetes mellitus without complications: Secondary | ICD-10-CM

## 2018-07-06 DIAGNOSIS — G4731 Primary central sleep apnea: Secondary | ICD-10-CM

## 2018-07-06 DIAGNOSIS — I484 Atypical atrial flutter: Secondary | ICD-10-CM

## 2018-07-06 DIAGNOSIS — I480 Paroxysmal atrial fibrillation: Secondary | ICD-10-CM

## 2018-07-06 DIAGNOSIS — M797 Fibromyalgia: Secondary | ICD-10-CM

## 2018-07-06 DIAGNOSIS — G43009 Migraine without aura, not intractable, without status migrainosus: Secondary | ICD-10-CM

## 2018-07-06 NOTE — Patient Instructions (Signed)
Atrial Fibrillation Atrial fibrillation is a type of irregular or rapid heartbeat (arrhythmia). In atrial fibrillation, the top part of the heart (atria) quivers in a chaotic pattern. This makes the heart unable to pump blood normally. Having atrial fibrillation can increase your risk for other health problems, such as:  Blood can pool in the atria and form clots. If a clot travels to the brain, it can cause a stroke.  The heart muscle may weaken from the irregular blood flow. This can cause heart failure. Atrial fibrillation may start suddenly and stop on its own, or it may become a long-lasting problem. What are the causes? This condition is caused by some heart-related conditions or procedures, including:  High blood pressure. This is the most common cause.  Heart failure.  Heart valve conditions.  Inflammation of the sac that surrounds the heart (pericarditis).  Heart surgery.  Coronary artery disease.  Certain heart rhythm disorders, such as Wolf-[name]-White syndrome. Other causes include:  Pneumonia.  Obstructive sleep apnea.  Lung cancer.  Thyroid problems, especially if the thyroid is overactive (hyperthyroidism).  Excessive alcohol or drug use. Sometimes, the cause of this condition is not known. What increases the risk? This condition is more likely to develop in:  Older people.  People who smoke.  People who have diabetes mellitus.  People who are overweight (obese).  Athletes who exercise vigorously.  People who have a family history. What are the signs or symptoms? Symptoms of this condition include:  A feeling that your heart is beating rapidly or irregularly.  A feeling of discomfort or pain in your chest.  Shortness of breath.  Sudden light-headedness or weakness.  Getting tired easily during exercise. In some cases, there are no symptoms. How is this diagnosed? Your health care provider may be able to detect atrial fibrillation when  taking your pulse. If detected, this condition may be diagnosed with:  Electrocardiogram (ECG).  Ambulatory cardiac monitor. This device records your heartbeats for 24 hours or more.  Transthoracic echocardiogram (TTE) to evaluate how blood flows through your heart.  Transesophageal echocardiogram (TEE) to view more detailed images of your heart.  A stress test.  Imaging tests, such as a CT scan or chest X-ray.  Blood tests. How is this treated? This condition may be treated with:  Medicines to slow down the heart rate or bring the heart's rhythm back to normal.  Medicines to prevent blood clots from forming.  Electrical cardioversion. This delivers a low-energy shock to the heart to reset its rhythm.  Ablation. This procedure destroys the part of the heart tissue that sends abnormal signals.  Left atrial appendage occlusion/excision. This seals off a common place in the atria where blood clots can form (left atrial appendage). The goal of treatment is to prevent blood clots from forming and to keep your heart beating at a normal rate and rhythm. Treatment depends on underlying medical conditions and how you feel when you are experiencing fibrillation. Follow these instructions at home: Medicines  Take over-the counter and prescription medicines only as told by your health care provider.  If your health care provider prescribed a blood-thinning medicine (anticoagulant), take it exactly as told. Taking too much blood-thinning medicine can cause bleeding. Taking too little can enable a blood clot to form and travel to the brain, causing a stroke. Lifestyle      Do not use any products that contain nicotine or tobacco, such as cigarettes and e-cigarettes. If you need help quitting, ask your health   care provider.  Do not drink beverages that contain caffeine, such as coffee, soda, and tea.  Follow diet instructions as told by your health care provider.  Exercise regularly as  told by your health care provider.  Do not drink alcohol. General instructions  If you have obstructive sleep apnea, manage your condition as told by your health care provider.  Maintain a healthy weight. Do not use diet pills unless your health care provider approves. Diet pills may make heart problems worse.  Keep all follow-up visits as told by your health care provider. This is important. Contact a health care provider if you:  Notice a change in the rate, rhythm, or strength of your heartbeat.  Are taking an anticoagulant and you notice increased bruising.  Tire more easily when you exercise or exert yourself.  Have a sudden change in weight. Get help right away if you have:   Chest pain, abdominal pain, sweating, or weakness.  Difficulty breathing.  Blood in your vomit, stool (feces), or urine.  Any symptoms of a stroke. "BE FAST" is an easy way to remember the main warning signs of a stroke: ? B - Balance. Signs are dizziness, sudden trouble walking, or loss of balance. ? E - Eyes. Signs are trouble seeing or a sudden change in vision. ? F - Face. Signs are sudden weakness or numbness of the face, or the face or eyelid drooping on one side. ? A - Arms. Signs are weakness or numbness in an arm. This happens suddenly and usually on one side of the body. ? S - Speech. Signs are sudden trouble speaking, slurred speech, or trouble understanding what people say. ? T - Time. Time to call emergency services. Write down what time symptoms started.  Other signs of a stroke, such as: ? A sudden, severe headache with no known cause. ? Nausea or vomiting. ? Seizure. These symptoms may represent a serious problem that is an emergency. Do not wait to see if the symptoms will go away. Get medical help right away. Call your local emergency services (911 in the [place] Do not drive yourself to the hospital. Summary  Atrial fibrillation is a type of irregular or rapid heartbeat  (arrhythmia).  Symptoms include a feeling that your heart is beating fast or irregularly. In some cases, you may not have symptoms.  The condition is treated with medicines to slow down the heart rate or bring the heart's rhythm back to normal. You may also need blood-thinning medicines to prevent blood clots.  Get help right away if you have symptoms or signs of a stroke. This information is not intended to replace advice given to you by your health care provider. Make sure you discuss any questions you have with your health care provider. Document Released: 01/18/2005 Document Revised: 03/11/2017 Document Reviewed: 03/11/2017 Elsevier Interactive Patient Education  2019 Elsevier Inc.  

## 2018-07-06 NOTE — Progress Notes (Signed)
SLEEP MEDICINE CLINIC   Provider:  Larey Seat, M D  Primary Care Physician:  Steele Sizer, MD   Referring Provider: Dr. Baxter Flattery  Virtual Visit via Video Note  I connected with@ on 07/06/18 at 10:00 AM EDT by a video enabled telemedicine application and verified that I am speaking with the correct person using two identifiers.  Location: Patient: at home  Provider:at [facility]    I discussed the limitations of evaluation and management by telemedicine and the availability of in person appointments. The patient expressed understanding and agreed to proceed.  Larey Seat, MD    HPI:  Sonya Lopez is a 63 y.o. female patient, seen here in a referral from Dr Jaynee Eagles, reporting that she has not slept well in 22 years.    Chief complaint according to patient : Many medications a have affected the patients sleep quality, including her breast cancer medication, the sciatica , the antidepressants used.   Sleep / medical history: R ecent cardioversion on April 8th 2020  in [place], [place] for atrial fib and now on anticoagulation.  Rest listed below.   Family sleep / medical history: Mother had dementia, Lewy Body type. Daughter has dysautonomia.    Social history: married, adult children, household with 3 people ( granddaughter , age 41,  lives with her and is home schooled by patient). Non smoker, non drinker, caffeine : Pepsi 16 ounces/ d. No coffee, no chocolate, no tea.  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   Sleep habits are as follows: Dinner time is 6.30 PM, she watches Tv watching in the den, may read in bed. Bedtime 10 PM, husband follows at 84 PM, she has trouble to initiate sleep. Cool, quiet and dark bedroom, she sleeps on the left or supine, bed is adjusted, raised back- she uses one pillow.   May be 2-3 AM when she sleeps. Bathroom breaks , 2 average.  She wakes up at 6.30 AM , with her husband.  Averages 6 hours of sleep.   She never seems refreshed or restored.  Naps in the afternoon from 20-50 minutes. Naps feel more refreshing.    Review of Systems: Out of a complete 14 system review, the patient complains of only the following symptoms, and all other reviewed systems are negative.   Fibromyalgia - never feeling " energized, always dragging"  Husband stated  She snores mildly, but he snores very loud and sleep very deep. Daughter tell her she snores when beginning to wake up.  She has woken herself snoring. No choking.   How likely are you to doze in the following situations: 0 = not likely, 1 = slight chance, 2 = moderate chance, 3 = high chance  Sitting and Reading? Watching Television? Sitting inactive in a public place (theater or meeting)? Lying down in the afternoon when circumstances permit? Sitting and talking to someone? Sitting quietly after lunch without alcohol? In a car, while stopped for a few minutes in traffic? As a passenger in a car for an hour without a break?  Total = 9 with naps daily.   FSS - N/A   Social History   Socioeconomic History  . Marital status: Married    Spouse name: Environmental consultant   . Number of children: 3  . Years of education: 16  . Highest education level: Bachelor's degree (e.g., BA, AB, BS)  Occupational History  . Not on file  Social Needs  . Financial resource strain:  Not hard at all  . Food insecurity:    Worry: Never true    Inability: Never true  . Transportation needs:    Medical: No    Non-medical: No  Tobacco Use  . Smoking status: Never Smoker  . Smokeless tobacco: Never Used  Substance and Sexual Activity  . Alcohol use: Not Currently    Alcohol/week: 0.0 standard drinks    Frequency: Never    Comment: Due to AF  . Drug use: No  . Sexual activity: Yes    Partners: Male    Birth control/protection: Surgical  Lifestyle  . Physical activity:    Days per week: 3 days    Minutes per session: 40 min  . Stress: To some extent   Relationships  . Social connections:    Talks on phone: More than three times a week    Gets together: More than three times a week    Attends religious service: More than 4 times per year    Active member of club or organization: Yes    Attends meetings of clubs or organizations: More than 4 times per year    Relationship status: Married  . Intimate partner violence:    Fear of current or ex partner: No    Emotionally abused: No    Physically abused: No    Forced sexual activity: No  Other Topics Concern  . Not on file  Social History Narrative   Lives at home with husband and one daughter   Right handed   Caffeine: 1 cup daily    Family History  Problem Relation Age of Onset  . Diabetes Mother   . Hearing loss Mother   . Hypertension Mother   . Heart disease Father   . Heart disease Paternal Grandfather   . Asthma Daughter   . Asthma Daughter   . Migraines Daughter   . Fibromyalgia Daughter   . Interstitial cystitis Daughter   . Asthma Daughter   . Colitis Daughter   . Thyroid cancer Daughter 4  . Diabetes Brother   . Hypertension Brother   . Breast cancer Neg Hx     Past Medical History:  Diagnosis Date  . Allergy   . Anxiety    situational anxiety  . Arrhythmia    A-fib/A-flutter  . Atrial flutter ([place])   . 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  MammoSite  . Cancer ([place]) 2017  . Depression   . Diet-controlled type 2 diabetes mellitus ([place])   . Difficult intubation 2009   [place] had to use  'boogee"  . Female cystocele 01/05/2011  . Fibromyalgia   . GERD (gastroesophageal reflux disease)   . Headache(784.0)   . Kidney stones   . Obesity   . Personal history of radiation therapy 2017   LEFT lumpectomy  . PONV (postoperative nausea and vomiting)    h/o difficult intubation and anes had to use "boogee"  . Vitreous detachment of right eye    sees floaters     Past Surgical History:  Procedure Laterality Date  . ABDOMINAL HYSTERECTOMY  2013  . ANTERIOR AND POSTERIOR REPAIR  11/30/2011   Procedure: ANTERIOR (CYSTOCELE) AND POSTERIOR REPAIR (RECTOCELE);  Surgeon: Reece Packer, MD;  Location: [facility];  Service: Urology;  Laterality: N/A;  with cysto  . BLADDER SUSPENSION  11/30/2011   Procedure: [facility] PROCEDURE;  Surgeon: Reece Packer, MD;  Location:  [facility];  Service: Urology;  Laterality: N/A;  graft 10x6  . BREAST BIOPSY Right 2007   negative core  . BREAST BIOPSY Left 09/16/2015   DCIS  . BREAST LUMPECTOMY Left 09/30/2015   DCIS clear margins and rad tx/HIGH GRADE DUCTAL CARCINOMA IN SITU, COMEDO TYPE  . BREAST LUMPECTOMY WITH NEEDLE LOCALIZATION Left 09/30/2015   DCIS excision, MammoSite radiation;  Surgeon: Robert Bellow, MD;  Location: [facility];  Service: General;  Laterality: Left;  . CESAREAN SECTION     x 1  . CHOLECYSTECTOMY  2005  . COLONOSCOPY  2009  . CYSTOSCOPY  11/30/2011   Procedure: CYSTOSCOPY;  Surgeon: Reece Packer, MD;  Location: [facility];  Service: Urology;;  . Brigitte Pulse AND CURETTAGE OF UTERUS  05/05/2007  . GALLBLADDER SURGERY  2004  . TONSILLECTOMY  1961  . VAGINAL HYSTERECTOMY  11/30/2011   Procedure: HYSTERECTOMY VAGINAL;  Surgeon: Emily Filbert, MD;  Location: [facility];  Service: Gynecology;  Laterality: N/A;    Current Outpatient Medications  Medication Sig Dispense Refill  . ALPRAZolam (XANAX) 0.25 MG tablet Take 1-2 tabs (0.25mg -0.50mg ) 30-60 minutes before procedure. May repeat if needed.Do not drive. 15 tablet 0  . amiodarone (PACERONE) 200 MG tablet Take 2 tabs (400 mg) twice daily x 5 days, then take 1 tab (200 mg) twice daily x 14 days, then take 1 tab (200 mg) once daily 60 tablet 0  . amiodarone (PACERONE) 200 MG tablet Take 1 tablet (200 mg total) by mouth daily. 90 tablet 1  . apixaban (ELIQUIS) 5 MG TABS tablet Take 1 tablet (5 mg total) by mouth 2 (two) times daily. 180 tablet 3  .  Clindamycin Phosphate foam Apply 1 Units topically as needed. For rosacea    5  . DULoxetine 40 MG CPEP Take 40 mg by mouth daily. 90 capsule 0  . FINACEA 15 % FOAM     . Fremanezumab-vfrm (AJOVY) 225 MG/1.5ML SOSY Inject 225 mg into the skin every 30 (thirty) days. 6 Syringe 0  . gabapentin (NEURONTIN) 100 MG capsule Take 1 capsule (100 mg total) by mouth 2 (two) times daily. 180 capsule 1  . levocetirizine (XYZAL) 5 MG tablet Take 1 tablet (5 mg total) by mouth every evening. (Patient taking differently: Take 5 mg by mouth as needed. ) 30 tablet 0  . magnesium oxide (MAG-OX) 400 MG tablet Take 400 mg by mouth every morning.     . meloxicam (MOBIC) 15 MG tablet Take 15 mg by mouth as needed.     . metoprolol succinate (TOPROL-XL) 25 MG 24 hr tablet Take 0.5 tablets (12.5 mg total) by mouth daily. 45 tablet 3  . mometasone (NASONEX) 50 MCG/ACT nasal spray Place 2 sprays into the nose every morning.     . naratriptan (AMERGE) 2.5 MG tablet TAKE 1 TABLET BY MOUTH AT ONSET OF HEADACHE. MAY REPEAT AFTER 4 HOURSIF RETURNS OR DOES NOT RESOLVE MAX OF 2 TABS PER DAY. 9 tablet 5  . omeprazole (PRILOSEC) 20 MG capsule TAKE 1 CAPSULE BY MOUTH EVERY DAY 90 capsule 1  . Probiotic Product (PROBIOTIC PO) Take 1 capsule by mouth daily.     . propranolol (INDERAL) 10 MG tablet TAKE ONE TABLET EVERY DAY WHEN TAKING EXTRA FLECAINIDE FOR BREATHROUGH A-FIB 30 tablet 1   No current facility-administered medications for this visit.     Allergies as of 07/06/2018 - Review Complete 07/04/2018  Allergen Reaction Noted  . Adhesive [tape] Dermatitis 11/23/2011  .  Levaquin  [levofloxacin]  07/29/2014  . Macrodantin [nitrofurantoin] Nausea And Vomiting 11/15/2011  . Metronidazole Nausea Only 07/29/2014  . Nitrofurantoin macrocrystal Nausea And Vomiting 01/31/2015  . Sulfamethoxazole-trimethoprim Nausea Only 07/29/2014  . Cephalexin Rash 12/28/2010  . Clindamycin hcl Rash 01/31/2015  . Doxycycline Swelling and Rash  12/28/2010  . Erythromycin Rash 11/15/2011    Last Weight: 206 #   WEX:HBZJI is no height or weight on file to calculate BMI.      Last Height:  5.6"   Observation: General: The patient is awake, alert and appears not in acute distress. The patient is well groomed. Head: Normocephalic, atraumatic. Neck is supple without ROM restriction.  Mallampati grade : 3 Neck circumference: 14 inches Nasal airflow is patent. Retrognathia is not seen.  Respiratory: Breath holding was possible for 22 seconds. Skin:  Without evidence of facial edema or rash  Neurologic exam : The patient is awake and alert, oriented to place and time.   Attention span & concentration ability appears normal.  Speech is fluent, without  dysarthria, dysphonia or aphasia.  Mood and affect are appropriate.  Cranial nerves: Pupils are equal in size and round. Extraocular movements  in vertical and horizontal planes intact. Beginning cataracts .Facial motor strength is symmetric and tongue and uvula move midline.  Shoulder shrug was symmetrical.   Motor exam:  Normal muscle bulk and symmetric ROM ( range of movement) in upper/extremities.  Coordination: Rapid alternating movements in the fingers/hands were normal. Finger-to-nose maneuver demonstrated no evidence of ataxia, dysmetria or tremor.  Gait and station: n/a   Assessment and Plan:  Previous HST about 2 years ago was negative for sleep apnea-  Ordered  In [place].   Evaluation for possible OSA/ CSA.   Follow Up Instructions:  I ordered both an attended PSG and HST.    I discussed the assessment and treatment plan with the patient. The patient was provided an opportunity to ask questions and all were answered. The patient agreed with the plan and demonstrated an understanding of the instructions.   The patient was advised to call back or seek an in-person evaluation if the symptoms worsen or if the condition fails to improve as anticipated.  I  provided 35  minutes of non-face-to-face time during this encounter.  Larey Seat, MD 10/08/7891, 81:01 AM  Certified in Neurology by ABPN Certified in [place] by [facility] [address] [place], [place] [address]

## 2018-07-07 ENCOUNTER — Other Ambulatory Visit: Payer: Self-pay

## 2018-07-07 ENCOUNTER — Other Ambulatory Visit: Payer: Self-pay | Admitting: Family Medicine

## 2018-07-07 DIAGNOSIS — F320 Major depressive disorder, single episode, mild: Secondary | ICD-10-CM

## 2018-07-07 MED ORDER — DULOXETINE HCL 20 MG PO CSDR: 40.0000 mg | DELAYED_RELEASE_CAPSULE | Freq: Every day | ORAL | 0 refills | Status: DC

## 2018-07-17 ENCOUNTER — Ambulatory Visit (INDEPENDENT_AMBULATORY_CARE_PROVIDER_SITE_OTHER): Payer: Medicare Other | Admitting: Neurology

## 2018-07-17 DIAGNOSIS — I484 Atypical atrial flutter: Secondary | ICD-10-CM

## 2018-07-17 DIAGNOSIS — G4731 Primary central sleep apnea: Secondary | ICD-10-CM | POA: Diagnosis not present

## 2018-07-17 DIAGNOSIS — E119 Type 2 diabetes mellitus without complications: Secondary | ICD-10-CM

## 2018-07-17 DIAGNOSIS — I480 Paroxysmal atrial fibrillation: Secondary | ICD-10-CM

## 2018-07-17 DIAGNOSIS — G43009 Migraine without aura, not intractable, without status migrainosus: Secondary | ICD-10-CM

## 2018-07-17 DIAGNOSIS — M797 Fibromyalgia: Secondary | ICD-10-CM

## 2018-07-19 ENCOUNTER — Other Ambulatory Visit: Payer: Self-pay | Admitting: Family Medicine

## 2018-07-19 DIAGNOSIS — M797 Fibromyalgia: Secondary | ICD-10-CM

## 2018-07-20 ENCOUNTER — Other Ambulatory Visit: Payer: Self-pay

## 2018-07-20 ENCOUNTER — Ambulatory Visit
Admission: RE | Admit: 2018-07-20 | Discharge: 2018-07-20 | Disposition: A | Discharge status: Home / Self Care | Payer: Medicare Other | Source: Ambulatory Visit | Attending: Neurology | Admitting: Neurology

## 2018-07-20 DIAGNOSIS — R29898 Other symptoms and signs involving the musculoskeletal system: Secondary | ICD-10-CM

## 2018-07-20 DIAGNOSIS — M5416 Radiculopathy, lumbar region: Secondary | ICD-10-CM

## 2018-07-20 DIAGNOSIS — M2550 Pain in unspecified joint: Secondary | ICD-10-CM

## 2018-07-20 DIAGNOSIS — R200 Anesthesia of skin: Secondary | ICD-10-CM

## 2018-07-20 DIAGNOSIS — M48062 Spinal stenosis, lumbar region with neurogenic claudication: Secondary | ICD-10-CM

## 2018-07-21 ENCOUNTER — Encounter: Payer: Self-pay | Admitting: Family Medicine

## 2018-07-21 NOTE — Procedures (Signed)
PATIENT'S NAME:  Sonya, Lopez DOB:      [birth year]      MR#:    [id number]     DATE OF RECORDING: 07/17/2018 REFERRING M.D.:  Sarina Ill MD Study Performed:   Baseline Polysomnogram HISTORY:  Sonya Lopez is a 63 y.o. female patient, seen here in a referral from Dr. Jaynee Eagles, reporting that she has not slept well in 22 years.  Chief complaint: Many medications a have affected the patients sleep quality, including her breast cancer medication, chronic sciatica, and Depression. Recent cardioversion on April 8th 2020 in [place], [place] for atrial fib and now on anticoagulation. She reports a long sleep latency- but averages 6 hours of sleep.  She never feels refreshed or restored.  Naps in the afternoon from 20-50 minutes and feels more refreshed.  The patient endorsed the Epworth Sleepiness Scale at 9/24 points.   [name] weight 207 pounds with a height of 66 (inches), resulting in a BMI of 33.3 kg/m2. [name] neck circumference measured 14 inches.  CURRENT MEDICATIONS: Xanax, Pacerone, Eliquis, Duloxetine, Ajovy, Neurontin, Xyzal, Mag-ox, Mobic, Toprol-XL, Nasonex, Amerge, Prilosec, Probiotics, Inderal.   PROCEDURE:  This is a multichannel digital polysomnogram utilizing the Somnostar 11.2 system.  Electrodes and sensors were applied and monitored per AASM Specifications.   EEG, EOG, Chin and Limb EMG, were sampled at 200 Hz.  ECG, Snore and Nasal Pressure, Thermal Airflow, Respiratory Effort, CPAP Flow and Pressure, Oximetry was sampled at 50 Hz. Digital video and audio were recorded.      BASELINE STUDY: Lights Out was at 22:09 and Lights On at 05:02.  Total recording time (TRT) was 414 minutes, with a total sleep time (TST) of 320.5 minutes.   [name] sleep latency was 28.5 minutes.  REM latency was 214 minutes.  The sleep efficiency was 77.4 %.     SLEEP ARCHITECTURE: WASO (Wake after sleep onset) was 75 minutes.  There were 8 minutes in Stage N1, 223.5 minutes  Stage N2, 51.5 minutes Stage N3 and 37.5 minutes in Stage REM.  The percentage of Stage N1 was 2.5%, Stage N2 was 69.7%, Stage N3 was 16.1% and Stage R (REM sleep) was 11.7%.   RESPIRATORY ANALYSIS:  There were a total of 24 respiratory events:  1 obstructive apnea, 0 central apneas and 23 hypopneas. The total APNEA/HYPOPNEA INDEX (AHI) was 4.5 /hour.  18 events occurred in REM sleep and 10 events in NREM. The REM AHI was 28.8 /hour, versus a non-REM AHI of 1.3/h. The patient spent 107.5 minutes of total sleep time in the supine position and 213 minutes in non-supine. The supine AHI was 12.3 versus a non-supine AHI of 0.6/h.  OXYGEN SATURATION & C02:  The Wake baseline 02 saturation was 93%, with the lowest being 79%.  Time spent below 89% saturation equaled 183 minutes. The arousals were noted as: 54 were spontaneous, 0 were associated with PLMs, and 3 were associated with respiratory events.  The patient had a total of 0 Periodic Limb Movements.   Audio and video analysis did not show any abnormal or unusual movements, behaviors, phonations or vocalizations.   The patient took bathroom breaks. Snoring was noted. EKG with regular rhythm, intermittent bradycardia. Post-study, the patient indicated that sleep was the same as usual.    IMPRESSION:  1. Very mild Obstructive Sleep Apnea (OSA) with an AHI below 5/h and only during REM sleep exacerbated the AHI to 28/h.  2. Prolonged hypoxemia was noted with over 3  hours total in desaturation, but the patient does not qualify for CPAP therapy. 3. Mild Snoring.   RECOMMENDATIONS:  1. Advise pulmonary referral to evaluate pulmonary function and oxygen saturation in a walking test. Based on the sleep study I am unable to provide PAP or 02 therapy.      I certify that I have reviewed the entire raw data recording prior to the issuance of this report in accordance with the Standards of Accreditation of the American Academy of Sleep Medicine  (AASM)    Larey Seat, MD Diplomat, American Board of Psychiatry and Neurology  Diplomat, American Board of Sleep Medicine Market researcher, [place] [facility] [employer]

## 2018-07-24 ENCOUNTER — Telehealth: Payer: Self-pay | Admitting: Neurology

## 2018-07-24 ENCOUNTER — Other Ambulatory Visit: Payer: Self-pay | Admitting: Family Medicine

## 2018-07-24 DIAGNOSIS — G4734 Idiopathic sleep related nonobstructive alveolar hypoventilation: Secondary | ICD-10-CM

## 2018-07-24 NOTE — Telephone Encounter (Signed)
Called and reviewed the patient's sleep study with her. Informed her that she sleep study indicated she had very little apnea that was present. Advised that we did note that her oxygen level dropped below 89% for a little over 3 hours throughout the test. Advised the patient Dr Brett Fairy would recommend the patient see a pulmonologist to address her oxygen levels and see how she can seek treatment for that. Pt verbalized understanding. I have mailed a copy of the study to the patient.

## 2018-07-24 NOTE — Telephone Encounter (Signed)
-----   Message from Larey Seat, MD sent at 07/21/2018  2:01 PM EDT ----- IMPRESSION:  1. Very mild Obstructive Sleep Apnea (OSA) with an AHI below 5/h and only during REM sleep exacerbated the AHI to 28/h.  2. Prolonged hypoxemia was noted with over 3 hours total in desaturation, but the patient does not qualify for CPAP therapy. 3. Mild Snoring.   RECOMMENDATIONS:  1. Advise pulmonary referral to evaluate pulmonary function and oxygen saturation in a walking test. Based on the sleep study I am unable to provide PAP or 02 therapy.

## 2018-07-26 ENCOUNTER — Telehealth: Payer: Self-pay | Admitting: Internal Medicine

## 2018-07-26 NOTE — Telephone Encounter (Signed)
Called patient for COVID-19 pre-screening for in office visit.  Have you recently traveled any where out of the local area in the last 2 weeks? No  Have you been in close contact with a person diagnosed with COVID-19 within the last 2 weeks? No  Do you currently have any of the following symptoms? If so, when did they start? Cough     Diarrhea   Joint Pain Fever      Muscle Pain   Red eyes Shortness of breath   Abdominal pain  Vomiting Loss of smell    Rash    Sore Throat Headache    Weakness   Bruising or bleeding   Okay to proceed with visit 07/28/2018

## 2018-07-27 NOTE — Progress Notes (Signed)
[facility] Consultation      Assessment and Plan:  Excessive daytime sleepiness. -Symptoms and signs of obstructive sleep apnea - We will send for home sleep study, patient is asked sleep in the supine position for the duration of the study.  Atrial fibrillation, depression, GERD - Sleep apnea can contribute to above conditions, therefore treatment of sleep apnea is an important part of their management.  Nocturnal hypoxemia. - Mild hypoxemia was seen overnight on the recent sleep study of uncertain etiology, patient denies dyspnea symptoms.  This may have been secondary to desats from sleep apnea. - We will check a chest x-ray.  Orders Placed This Encounter  Procedures  . DG Chest 2 View  . Home sleep test   Return in about 3 months (around 10/28/2018).   Date: 07/28/2018  MRN# [id number] Sonya Lopez [birth year]   Sonya Lopez is a 63 y.o. old female seen in consultation for chief complaint of:    Chief Complaint  Patient presents with  . Consult    Referred by-Krichna Sowles-Had sleep study done 2 weeks ago- was told has mild apnea and did not need CPAP but was concerned about the drop in her O2 sat. at night. Snores at night and feels tired during the day.    HPI:  Sonya Lopez is a 63 y.o. old female she underwent a home sleep study at [facility] neurology which showed borderline obstructive sleep apnea with AHI of 4.5 with low oxygen apparently. She has been told that she has a fib and has been having daytime sleepiness. Apparently her oxygen was low for much of the study.  She has been sleepy for a long time, she has trouble with memory and concentration, she notes that she forgets small details throughout the day that are frustrating. Currently she says that she feels sleepy.  She goes to bed at 10 and 11 pm, falls asleep within 30 min to 1 hour. Sleeps until 630 am, when wakes does not feel rested. She naps for about 45 min per day.    No TMJ, denies jaw pain, no dentures. No sleep walking no cataplexy, no sleep paralysis. Her mother had OSA.  She denies respiratory issues, [place], her father smoked, she has never smoked. 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 She walks at a moderate pace for 30 minutes 3 times per week and she does not feel winded with this.   **07/18/2018 PSG>> At [facility]; overall total AHI was 4.5, with supine AHI of 12.  PMHX:   Past Medical History:  Diagnosis Date  . Allergy   . Anxiety    situational anxiety  . Arrhythmia    A-fib/A-flutter  . Atrial flutter ([place])   . 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  MammoSite  . Cancer ([place]) 2017  . Depression   . Diet-controlled type 2 diabetes mellitus ([place])   . Difficult intubation 2009   [place] had to use  'boogee"  . Female cystocele 01/05/2011  . Fibromyalgia   . GERD (gastroesophageal reflux disease)   . Headache(784.0)   . Kidney stones   . Obesity   . Personal history of radiation therapy 2017   LEFT lumpectomy  . PONV (postoperative nausea and vomiting)    h/o difficult intubation and anes had to use "boogee"  . Vitreous detachment of right  eye    sees floaters   Surgical Hx:  Past Surgical History:  Procedure Laterality Date  . ABDOMINAL HYSTERECTOMY  2013  . ANTERIOR AND POSTERIOR REPAIR  11/30/2011   Procedure: ANTERIOR (CYSTOCELE) AND POSTERIOR REPAIR (RECTOCELE);  Surgeon: Reece Packer, MD;  Location: [facility];  Service: Urology;  Laterality: N/A;  with cysto  . BLADDER SUSPENSION  11/30/2011   Procedure: [facility] PROCEDURE;  Surgeon: Reece Packer, MD;  Location: [facility];  Service: Urology;  Laterality: N/A;  graft 10x6  . BREAST BIOPSY Right 2007   negative core  . BREAST BIOPSY Left 09/16/2015   DCIS  . BREAST LUMPECTOMY Left 09/30/2015   DCIS clear margins and rad tx/HIGH  GRADE DUCTAL CARCINOMA IN SITU, COMEDO TYPE  . BREAST LUMPECTOMY WITH NEEDLE LOCALIZATION Left 09/30/2015   DCIS excision, MammoSite radiation;  Surgeon: Robert Bellow, MD;  Location: [facility];  Service: General;  Laterality: Left;  . CESAREAN SECTION     x 1  . CHOLECYSTECTOMY  2005  . COLONOSCOPY  2009  . CYSTOSCOPY  11/30/2011   Procedure: CYSTOSCOPY;  Surgeon: Reece Packer, MD;  Location: [facility];  Service: Urology;;  . Brigitte Pulse AND CURETTAGE OF UTERUS  05/05/2007  . GALLBLADDER SURGERY  2004  . TONSILLECTOMY  1961  . VAGINAL HYSTERECTOMY  11/30/2011   Procedure: HYSTERECTOMY VAGINAL;  Surgeon: Emily Filbert, MD;  Location: [facility];  Service: Gynecology;  Laterality: N/A;   Family Hx:  Family History  Problem Relation Age of Onset  . Diabetes Mother   . Hearing loss Mother   . Hypertension Mother   . Heart disease Father   . Heart disease Paternal Grandfather   . Asthma Daughter   . Asthma Daughter   . Migraines Daughter   . Fibromyalgia Daughter   . Interstitial cystitis Daughter   . Asthma Daughter   . Colitis Daughter   . Thyroid cancer Daughter 15  . Diabetes Brother   . Hypertension Brother   . Breast cancer Neg Hx    Social Hx:   Social History   Tobacco Use  . Smoking status: Never Smoker  . Smokeless tobacco: Never Used  Substance Use Topics  . Alcohol use: Not Currently    Alcohol/week: 0.0 standard drinks    Frequency: Never    Comment: Due to AF  . Drug use: No   Medication:    Current Outpatient Medications:  .  amiodarone (PACERONE) 200 MG tablet, Take 1 tablet (200 mg total) by mouth daily., Disp: 90 tablet, Rfl: 1 .  apixaban (ELIQUIS) 5 MG TABS tablet, Take 1 tablet (5 mg total) by mouth 2 (two) times daily., Disp: 180 tablet, Rfl: 3 .  Clindamycin Phosphate foam, Apply 1 Units topically as needed. For rosacea  , Disp: , Rfl: 5 .  DULoxetine HCl 20 MG CSDR, Take 40 mg by mouth daily., Disp: 60 capsule, Rfl: 0 .  FINACEA 15 % FOAM, , Disp:  , Rfl:  .  Fremanezumab-vfrm (AJOVY) 225 MG/1.5ML SOSY, Inject 225 mg into the skin every 30 (thirty) days., Disp: 6 Syringe, Rfl: 0 .  gabapentin (NEURONTIN) 100 MG capsule, TAKE 1 CAPSULE BY MOUTH TWICE DAILY, Disp: 180 capsule, Rfl: 1 .  levocetirizine (XYZAL) 5 MG tablet, Take 1 tablet (5 mg total) by mouth every evening. (Patient taking differently: Take 5 mg by mouth as needed. ), Disp: 30 tablet, Rfl: 0 .  magnesium oxide (MAG-OX) 400 MG tablet, Take 400 mg  by mouth every morning. , Disp: , Rfl:  .  meloxicam (MOBIC) 15 MG tablet, Take 15 mg by mouth as needed. , Disp: , Rfl:  .  metoprolol succinate (TOPROL-XL) 25 MG 24 hr tablet, Take 0.5 tablets (12.5 mg total) by mouth daily., Disp: 45 tablet, Rfl: 3 .  mometasone (NASONEX) 50 MCG/ACT nasal spray, Place 2 sprays into the nose every morning. , Disp: , Rfl:  .  naratriptan (AMERGE) 2.5 MG tablet, TAKE 1 TABLET BY MOUTH AT ONSET OF HEADACHE. MAY REPEAT AFTER 4 HOURSIF RETURNS OR DOES NOT RESOLVE MAX OF 2 TABS PER DAY., Disp: 9 tablet, Rfl: 5 .  omeprazole (PRILOSEC) 20 MG capsule, TAKE 1 CAPSULE BY MOUTH EVERY DAY, Disp: 90 capsule, Rfl: 1 .  Probiotic Product (PROBIOTIC PO), Take 1 capsule by mouth daily. , Disp: , Rfl:  .  propranolol (INDERAL) 10 MG tablet, TAKE ONE TABLET EVERY DAY WHEN TAKING EXTRA FLECAINIDE FOR BREATHROUGH A-FIB, Disp: 30 tablet, Rfl: 1   Allergies:  Adhesive [tape], Levaquin  [levofloxacin], Macrodantin [nitrofurantoin], Metronidazole, Nitrofurantoin macrocrystal, Sulfamethoxazole-trimethoprim, Cephalexin, Clindamycin hcl, Doxycycline, and Erythromycin  Review of Systems: Gen:  Denies  fever, sweats, chills HEENT: Denies blurred vision, double vision. bleeds, sore throat Cvc:  No dizziness, chest pain. Resp:   Denies cough or sputum production, shortness of breath Gi: Denies swallowing difficulty, stomach pain. Gu:  Denies bladder incontinence, burning urine Ext:   No Joint pain, stiffness. Skin: No skin  rash,  hives  Endoc:  No polyuria, polydipsia. Psych: No depression, insomnia. Other:  All other systems were reviewed with the patient and were negative other that what is mentioned in the HPI.   Physical Examination:   VS: BP 130/86 (BP Location: Right Arm, Cuff Size: Normal)   Pulse (!) 125   Temp 97.8 F (36.6 C) (Skin)   Ht 5\' 6"  (1.676 m)   Wt 208 lb 3.2 oz (94.4 kg)   SpO2 95%   BMI 33.60 kg/m   General Appearance: No distress  Neuro:without focal findings,  speech normal,  HEENT: PERRLA, EOM intact.   Pulmonary: normal breath sounds, No wheezing.  CardiovascularNormal S1,S2.  No m/r/g.   Abdomen: Benign, Soft, non-tender. Renal:  No costovertebral tenderness  GU:  No performed at this time. Endoc: No evident thyromegaly, no signs of acromegaly. Skin:   warm, no rashes, no ecchymosis  Extremities: normal, no cyanosis, clubbing.  Other findings:    LABORATORY PANEL:   CBC No results for input(s): WBC, HGB, HCT, PLT in the last 168 hours. ------------------------------------------------------------------------------------------------------------------  Chemistries  No results for input(s): NA, K, CL, CO2, GLUCOSE, BUN, CREATININE, CALCIUM, MG, AST, ALT, ALKPHOS, BILITOT in the last 168 hours.  Invalid input(s): GFRCGP ------------------------------------------------------------------------------------------------------------------  Cardiac Enzymes No results for input(s): TROPONINI in the last 168 hours. ------------------------------------------------------------  RADIOLOGY:  No results found.     Thank  you for the consultation and for allowing [facility] to assist in the care of your patient. Our recommendations are noted above.  Please contact us if we can be of further service.   Marda Stalker, M.D., F.C.C.P.  Board Certified in Internal Medicine, Pulmonary Medicine, [place], and Sleep Medicine.  [facility] Office Number: [phone]   07/28/2018

## 2018-07-28 ENCOUNTER — Ambulatory Visit
Admission: RE | Admit: 2018-07-28 | Discharge: 2018-07-28 | Disposition: A | Discharge status: Home / Self Care | Payer: Medicare Other | Source: Ambulatory Visit | Attending: Internal Medicine | Admitting: Internal Medicine

## 2018-07-28 ENCOUNTER — Encounter: Payer: Self-pay | Admitting: Internal Medicine

## 2018-07-28 ENCOUNTER — Other Ambulatory Visit: Payer: Self-pay

## 2018-07-28 ENCOUNTER — Ambulatory Visit (INDEPENDENT_AMBULATORY_CARE_PROVIDER_SITE_OTHER): Payer: Medicare Other | Admitting: Internal Medicine

## 2018-07-28 VITALS — BP 130/86 | HR 125 | Temp 97.8°F | Ht 66.0 in | Wt 208.2 lb

## 2018-07-28 DIAGNOSIS — R0609 Other forms of dyspnea: Secondary | ICD-10-CM

## 2018-07-28 DIAGNOSIS — R0600 Dyspnea, unspecified: Secondary | ICD-10-CM | POA: Diagnosis not present

## 2018-07-28 DIAGNOSIS — G4719 Other hypersomnia: Secondary | ICD-10-CM | POA: Diagnosis not present

## 2018-07-28 NOTE — Patient Instructions (Addendum)
Will send for sleep study. Please try your best to sleep on your back for the entire night.  Will also get a Chest Xray.   Sleep Apnea    Sleep apnea is disorder that affects a person's sleep. A person with sleep apnea has abnormal pauses in their breathing when they sleep. It is hard for them to get a good sleep. This makes a person tired during the day. It also can lead to other physical problems. There are three types of sleep apnea. One type is when breathing stops for a short time because your airway is blocked (obstructive sleep apnea). Another type is when the brain sometimes fails to give the normal signal to breathe to the muscles that control your breathing (central sleep apnea). The third type is a combination of the other two types.  HOME CARE   Take all medicine as told by your doctor.  Avoid alcohol, calming medicines (sedatives), and depressant drugs.  Try to lose weight if you are overweight. Talk to your doctor about a healthy weight goal.  Your doctor may have you use a device that helps to open your airway. It can help you get the air that you need. It is called a positive airway pressure (PAP) device.   MAKE SURE YOU:   Understand these instructions.  Will watch your condition.  Will get help right away if you are not doing well or get worse.  It may take approximately 1 month for you to get used to wearing her CPAP every night.  Be sure to work with your machine to get used to it, be patient, it may take time!  If you have trouble tolerating CPAP DO NOT RETURN YOUR MACHINE; Contact our office to see if we can help you tolerate the CPAP better first!

## 2018-07-31 MED ORDER — AMIODARONE HCL 200 MG PO TABS: 200.0000 mg | ORAL_TABLET | Freq: Every day | ORAL | 1 refills | Status: DC

## 2018-08-03 ENCOUNTER — Other Ambulatory Visit: Payer: Self-pay | Admitting: Family Medicine

## 2018-08-14 ENCOUNTER — Other Ambulatory Visit: Payer: Self-pay

## 2018-08-14 ENCOUNTER — Ambulatory Visit: Payer: Medicare Other

## 2018-08-14 DIAGNOSIS — G4733 Obstructive sleep apnea (adult) (pediatric): Secondary | ICD-10-CM

## 2018-08-14 DIAGNOSIS — G4719 Other hypersomnia: Secondary | ICD-10-CM

## 2018-08-16 ENCOUNTER — Telehealth: Payer: Self-pay | Admitting: Cardiovascular Disease

## 2018-08-16 MED ORDER — AMIODARONE HCL 200 MG PO TABS: 200.0000 mg | ORAL_TABLET | ORAL | 3 refills | Status: DC

## 2018-08-16 NOTE — Telephone Encounter (Signed)
Please call to discuss Amiodarone. Patient states she needs her rx re written.

## 2018-08-16 NOTE — Telephone Encounter (Signed)
Spoke with patient and she states that her amiodarone was once daily but also to take extra pill as needed for afib. She states that the pharmacy needed updated instructions in order to provide the extra amount needed. Sent in updated prescription and let her know that it was sent over to [facility] pharmacy. She verbalized understanding with no further questions at this time.

## 2018-08-18 ENCOUNTER — Telehealth: Payer: Self-pay | Admitting: Internal Medicine

## 2018-08-18 DIAGNOSIS — G4733 Obstructive sleep apnea (adult) (pediatric): Secondary | ICD-10-CM | POA: Diagnosis not present

## 2018-08-18 NOTE — Telephone Encounter (Signed)
HST performed on 08/14/2018 confirmed mild OSA with AHI of 11. Recommend auto cpap 5-20cm h2O.  Pt is aware of results and wished to proceed with cpap.  Order has been placed.  Pt has been scheduled for ROV on 10/02/2018. Nothing further is needed.

## 2018-08-21 ENCOUNTER — Other Ambulatory Visit: Payer: Self-pay | Admitting: *Deleted

## 2018-08-21 DIAGNOSIS — D0512 Intraductal carcinoma in situ of left breast: Secondary | ICD-10-CM

## 2018-08-22 ENCOUNTER — Encounter: Payer: Self-pay | Admitting: General Surgery

## 2018-08-30 ENCOUNTER — Other Ambulatory Visit: Payer: Self-pay | Admitting: Family Medicine

## 2018-08-30 NOTE — Telephone Encounter (Signed)
Refill request for general medication. Cymbalta   Last office visit 03/02/2018   Follow up on 09/01/2018

## 2018-08-31 ENCOUNTER — Other Ambulatory Visit: Payer: Self-pay | Admitting: Family Medicine

## 2018-09-01 ENCOUNTER — Other Ambulatory Visit: Payer: Self-pay

## 2018-09-01 ENCOUNTER — Ambulatory Visit (INDEPENDENT_AMBULATORY_CARE_PROVIDER_SITE_OTHER): Payer: Medicare Other | Admitting: Family Medicine

## 2018-09-01 ENCOUNTER — Encounter: Payer: Self-pay | Admitting: Family Medicine

## 2018-09-01 VITALS — BP 130/60 | HR 52 | Temp 97.1°F | Resp 16 | Ht 66.0 in | Wt 209.7 lb

## 2018-09-01 DIAGNOSIS — G43009 Migraine without aura, not intractable, without status migrainosus: Secondary | ICD-10-CM | POA: Diagnosis not present

## 2018-09-01 DIAGNOSIS — E538 Deficiency of other specified B group vitamins: Secondary | ICD-10-CM

## 2018-09-01 DIAGNOSIS — Z79899 Other long term (current) drug therapy: Secondary | ICD-10-CM

## 2018-09-01 DIAGNOSIS — Z23 Encounter for immunization: Secondary | ICD-10-CM | POA: Diagnosis not present

## 2018-09-01 DIAGNOSIS — F320 Major depressive disorder, single episode, mild: Secondary | ICD-10-CM

## 2018-09-01 DIAGNOSIS — E785 Hyperlipidemia, unspecified: Secondary | ICD-10-CM | POA: Diagnosis not present

## 2018-09-01 DIAGNOSIS — E119 Type 2 diabetes mellitus without complications: Secondary | ICD-10-CM | POA: Diagnosis not present

## 2018-09-01 DIAGNOSIS — E1169 Type 2 diabetes mellitus with other specified complication: Secondary | ICD-10-CM | POA: Diagnosis not present

## 2018-09-01 DIAGNOSIS — G4733 Obstructive sleep apnea (adult) (pediatric): Secondary | ICD-10-CM | POA: Diagnosis not present

## 2018-09-01 DIAGNOSIS — I4892 Unspecified atrial flutter: Secondary | ICD-10-CM | POA: Diagnosis not present

## 2018-09-01 DIAGNOSIS — E559 Vitamin D deficiency, unspecified: Secondary | ICD-10-CM | POA: Diagnosis not present

## 2018-09-01 DIAGNOSIS — M797 Fibromyalgia: Secondary | ICD-10-CM

## 2018-09-01 LAB — POCT GLYCOSYLATED HEMOGLOBIN (HGB A1C): HbA1c, POC (controlled diabetic range): 5.6 % (ref 0.0–7.0)

## 2018-09-01 MED ORDER — METOPROLOL SUCCINATE ER 25 MG PO TB24: 25.0000 mg | ORAL_TABLET | Freq: Every day | ORAL | 0 refills | Status: DC

## 2018-09-01 MED ORDER — DULOXETINE HCL 20 MG PO CPEP: 40.0000 mg | ORAL_CAPSULE | Freq: Every day | ORAL | 1 refills | Status: DC

## 2018-09-01 NOTE — Progress Notes (Signed)
Name: Sonya Lopez   MRN: [id number]    DOB: October 28, 1955   Date:09/01/2018       Progress Note  Subjective  Chief Complaint  Chief Complaint  Patient presents with  . Depression  . Diabetes  . Hyperlipidemia  . Migraine  . Fibromyalgia    HPI  OSA: Dr. Jaynee Eagles ordered sleep study 07/2018, showed mild OSA, but had hypoxia of 82% and seen by Dr. Felicie Morn ( pulmonologist) going to get a CPAP machine today. She can sleep all night but wakes up feeling tired, she wakes up with a dull headache at times.  FMS: She states that Cymbalta and Gabapentin seems to be helping with symptoms, she still has mental fogginess, and aches all over, pain level has been higher , she states today worse on her shoulders at 6/10  Bilateral hip pain: seen by [facility] and had PT , pain is stable, she also has bursitis, worse on the left side.   Atrial Flutter/Afib: She had a severe episode on April 4th, 2020, she had palpitation, dizziness and two episodes of syncope and husband called 62, Dr. Rockey Situ saw her on April 8th, 2020 and fleczinide was discharge and is back on Amiodarone now, she seems to be having two episodes per week that can last about 2-3 hours, recently had metoprolol dose adjusted   Migraine Headaches: since started on [place] migraines down from 4 per month to 1 per month, usually on left maxillary area and radiates to left forehead and down to left side of neck,.taking other medications prn  Mild Major Depression:she is still taking Duloxetine,phq 9 is stable, she denies crying spells, not sleeping well because of hip pain.   DMII: she is on diet only and hgbA1C is under control, urine micro is normal.She denies polyphagia, no polyuria or polydipsia. Eye exam is up to date, foot exam today . A1C is normal   GERD: under control with medication, she tries alternating Prilosec and Zantac.  History of left breast cancer: she had lumpectomy, could not tolerate estrogen  suppressive therapy.mammogram is up to date.Dr. Fleet Contras retired but will see Dr. Rushie Chestnut   Obesity: she states she has been eating healthier, except during holidays, she has lost 3 lbs since last visit 6 months ago. More vegetables and fruit    Patient Active Problem List   Diagnosis Date Noted  . Central sleep apnea 07/06/2018  . Paroxysmal atrial fibrillation ([place]) 07/06/2018  . Chronic migraine without aura, with intractable migraine, so stated, with status migrainosus 06/20/2018  . Syncope 05/10/2018  . Pain, elbow joint 02/02/2018  . PAF (paroxysmal atrial fibrillation) ([place]) 09/13/2017  . Trochanteric bursitis of left hip 06/24/2017  . Low back pain 02/22/2017  . Benign breast cyst in female, right 05/25/2016  . History of left breast cancer 09/16/2015  . Long-term use of high-risk medication 02/25/2015  . Increased urinary frequency 08/01/2014  . Mild major depression ([place]) 07/29/2014  . Gastric reflux 07/29/2014  . Chronic interstitial cystitis 07/29/2014  . Calculus of kidney 07/29/2014  . Allergic rhinitis 07/29/2014  . Acne erythematosa 07/29/2014  . Lumbosacral radiculitis 07/29/2014  . Tinnitus 07/29/2014  . Mixed incontinence, urge and stress (female) (female) 08/23/2013  . Hyperlipidemia 04/25/2013  . H/O: hysterectomy 11/30/2011  . Atrial flutter ([place]) 05/03/2011  . Diet-controlled type 2 diabetes mellitus ([place]) 04/16/2011  . Bladder cystocele 01/05/2011  . Migraine without aura and without status migrainosus, not intractable 12/29/2010  . Fibromyalgia 12/29/2010  . Obesity 12/29/2010  .  Migraine 12/29/2010    Past Surgical History:  Procedure Laterality Date  . ABDOMINAL HYSTERECTOMY  2013  . ANTERIOR AND POSTERIOR REPAIR  11/30/2011   Procedure: ANTERIOR (CYSTOCELE) AND POSTERIOR REPAIR (RECTOCELE);  Surgeon: Reece Packer, MD;  Location: [facility];  Service: Urology;  Laterality: N/A;  with cysto  . BLADDER SUSPENSION  11/30/2011   Procedure: [facility]  PROCEDURE;  Surgeon: Reece Packer, MD;  Location: [facility];  Service: Urology;  Laterality: N/A;  graft 10x6  . BREAST BIOPSY Right 2007   negative core  . BREAST BIOPSY Left 09/16/2015   DCIS  . BREAST LUMPECTOMY Left 09/30/2015   DCIS clear margins and rad tx/HIGH GRADE DUCTAL CARCINOMA IN SITU, COMEDO TYPE  . BREAST LUMPECTOMY WITH NEEDLE LOCALIZATION Left 09/30/2015   DCIS excision, MammoSite radiation;  Surgeon: Robert Bellow, MD;  Location: [facility];  Service: General;  Laterality: Left;  . CESAREAN SECTION     x 1  . CHOLECYSTECTOMY  2005  . COLONOSCOPY  2009  . CYSTOSCOPY  11/30/2011   Procedure: CYSTOSCOPY;  Surgeon: Reece Packer, MD;  Location: [facility];  Service: Urology;;  . Brigitte Pulse AND CURETTAGE OF UTERUS  05/05/2007  . GALLBLADDER SURGERY  2004  . TONSILLECTOMY  1961  . VAGINAL HYSTERECTOMY  11/30/2011   Procedure: HYSTERECTOMY VAGINAL;  Surgeon: Emily Filbert, MD;  Location: [facility];  Service: Gynecology;  Laterality: N/A;    Family History  Problem Relation Age of Onset  . Diabetes Mother   . Hearing loss Mother   . Hypertension Mother   . Heart disease Father   . Heart disease Paternal Grandfather   . Asthma Daughter   . Asthma Daughter   . Migraines Daughter   . Fibromyalgia Daughter   . Interstitial cystitis Daughter   . Asthma Daughter   . Colitis Daughter   . Thyroid cancer Daughter 20  . Diabetes Brother   . Hypertension Brother   . Breast cancer Neg Hx     Social History   Socioeconomic History  . Marital status: Married    Spouse name: Environmental consultant   . Number of children: 3  . Years of education: 16  . Highest education level: Bachelor's degree (e.g., BA, AB, BS)  Occupational History  . Not on file  Social Needs  . Financial resource strain: Not hard at all  . Food insecurity    Worry: Never true    Inability: Never true  . Transportation needs    Medical: No    Non-medical: No  Tobacco Use  . Smoking status: Never Smoker  .  Smokeless tobacco: Never Used  Substance and Sexual Activity  . Alcohol use: Not Currently    Alcohol/week: 0.0 standard drinks    Frequency: Never    Comment: Due to AF  . Drug use: No  . Sexual activity: Yes    Partners: Male    Birth control/protection: Surgical  Lifestyle  . Physical activity    Days per week: 3 days    Minutes per session: 40 min  . Stress: To some extent  Relationships  . Social connections    Talks on phone: More than three times a week    Gets together: More than three times a week    Attends religious service: More than 4 times per year    Active member of club or organization: Yes    Attends meetings of clubs or organizations: More than 4 times per year  Relationship status: Married  . Intimate partner violence    Fear of current or ex partner: No    Emotionally abused: No    Physically abused: No    Forced sexual activity: No  Other Topics Concern  . Not on file  Social History Narrative   Lives at home with husband and one daughter   Right handed   Caffeine: 1 cup daily     Current Outpatient Medications:  .  amiodarone (PACERONE) 200 MG tablet, Take 1 tablet (200 mg total) by mouth as directed. Once daily and may take second pill if needed for breakthrough afib, Disp: 180 tablet, Rfl: 3 .  apixaban (ELIQUIS) 5 MG TABS tablet, Take 1 tablet (5 mg total) by mouth 2 (two) times daily., Disp: 180 tablet, Rfl: 3 .  Clindamycin Phosphate foam, Apply 1 Units topically as needed. For rosacea  , Disp: , Rfl: 5 .  DULoxetine (CYMBALTA) 20 MG capsule, TAKE 2 CAPSULES EVERY DAY, Disp: 60 capsule, Rfl: 0 .  FINACEA 15 % FOAM, , Disp: , Rfl:  .  Fremanezumab-vfrm (AJOVY) 225 MG/1.5ML SOSY, Inject 225 mg into the skin every 30 (thirty) days., Disp: 6 Syringe, Rfl: 0 .  gabapentin (NEURONTIN) 100 MG capsule, TAKE 1 CAPSULE BY MOUTH TWICE DAILY, Disp: 180 capsule, Rfl: 1 .  magnesium oxide (MAG-OX) 400 MG tablet, Take 400 mg by mouth every morning. , Disp:  , Rfl:  .  meloxicam (MOBIC) 15 MG tablet, Take 15 mg by mouth as needed. , Disp: , Rfl:  .  metoprolol succinate (TOPROL-XL) 25 MG 24 hr tablet, Take 0.5 tablets (12.5 mg total) by mouth daily. (Patient taking differently: Take 25 mg by mouth daily. ), Disp: 45 tablet, Rfl: 3 .  mometasone (NASONEX) 50 MCG/ACT nasal spray, Place 2 sprays into the nose every morning. , Disp: , Rfl:  .  naratriptan (AMERGE) 2.5 MG tablet, TAKE 1 TABLET BY MOUTH AT ONSET OF HEADACHE. MAY REPEAT AFTER 4 HOURSIF RETURNS OR DOES NOT RESOLVE MAX OF 2 TABS PER DAY., Disp: 9 tablet, Rfl: 5 .  omeprazole (PRILOSEC) 20 MG capsule, TAKE 1 CAPSULE BY MOUTH EVERY DAY, Disp: 90 capsule, Rfl: 1 .  Probiotic Product (PROBIOTIC PO), Take 1 capsule by mouth daily. , Disp: , Rfl:  .  propranolol (INDERAL) 10 MG tablet, TAKE ONE TABLET EVERY DAY WHEN TAKING EXTRA FLECAINIDE FOR BREATHROUGH A-FIB, Disp: 30 tablet, Rfl: 1 .  levocetirizine (XYZAL) 5 MG tablet, Take 1 tablet (5 mg total) by mouth every evening. (Patient taking differently: Take 5 mg by mouth as needed. ), Disp: 30 tablet, Rfl: 0  Allergies  Allergen Reactions  . Adhesive [Tape] Dermatitis    SKIN TURNS RED-PAPER TAPE OK TO USE  . Levaquin  [Levofloxacin]   . Macrodantin [Nitrofurantoin] Nausea And Vomiting  . Metronidazole Nausea Only  . Nitrofurantoin Macrocrystal Nausea And Vomiting  . Sulfamethoxazole-Trimethoprim Nausea Only  . Cephalexin Rash  . Clindamycin Hcl Rash  . Doxycycline Swelling and Rash  . Erythromycin Rash    I personally reviewed active problem list, medication list, allergies, family history, social history with the patient/caregiver today.   ROS  Constitutional: Negative for fever or weight change.  Respiratory: Negative for cough and shortness of breath.   Cardiovascular: Negative for chest pain or palpitations.  Gastrointestinal: Negative for abdominal pain, no bowel changes.  Musculoskeletal: Negative for gait problem or joint  swelling.  Skin: Negative for rash.  Neurological: Negative for dizziness or headache.  No other specific complaints in a complete review of systems (except as listed in HPI above).  Objective  Vitals:   09/01/18 0816  BP: 130/60  Pulse: (!) 52  Resp: 16  Temp: (!) 97.1 F (36.2 C)  TempSrc: Temporal  SpO2: 93%  Weight: 209 lb 11.2 oz (95.1 kg)  Height: '5\' 6"'  (1.676 m)    Body mass index is 33.85 kg/m.  Physical Exam  Constitutional: Patient appears well-developed and well-nourished. Obese  No distress.  HEENT: head atraumatic, normocephalic, pupils equal and reactive to light,  neck supple  Cardiovascular: Normal rate, regular rhythm and normal heart sounds.  No murmur heard. No BLE edema. Pulmonary/Chest: Effort normal and breath sounds normal. No respiratory distress. Abdominal: Soft.  There is no tenderness. Psychiatric: Patient has a normal mood and affect. behavior is normal. Judgment and thought content normal.  Recent Results (from the past 2160 hour(s))  B. burgdorfi Antibody     Status: None   Collection Time: 06/19/18 11:50 AM  Result Value Ref Range   Lyme IgG/IgM Ab <0.91 0.00 - 0.90 ISR    Comment:                                 Negative         <0.91                                 Equivocal  0.91 - 1.09                                 Positive         >1.09   ANA,IFA RA Diag Pnl w/rflx Tit/Patn     Status: Abnormal   Collection Time: 06/19/18 11:50 AM  Result Value Ref Range   ANA Titer 1 Negative     Comment:                                      Negative   <1:80                                      Borderline  1:80                                      Positive   >1:80    Rhuematoid fact SerPl-aCnc <10.0 0.0 - 57.9 IU/mL   Cyclic Citrullin Peptide Ab 31 (H) 0 - 19 units    Comment:                           Negative               <20                           Weak positive      20 - 39  Moderate positive  40 - 59                            Strong positive        >59   Sedimentation rate     Status: None   Collection Time: 06/19/18 11:50 AM  Result Value Ref Range   Sed Rate 15 0 - 40 mm/hr  Sjogren's syndrome antibods(ssa + ssb)     Status: None   Collection Time: 06/19/18 11:50 AM  Result Value Ref Range   ENA SSA (RO) Ab <0.2 0.0 - 0.9 AI   ENA SSB (LA) Ab <0.2 0.0 - 0.9 AI  Heavy metals, blood     Status: None   Collection Time: 06/19/18 11:50 AM  Result Value Ref Range   Lead, Blood None Detected 0 - 4 ug/dL    Comment: Testing performed by Induct[employer] Estate manager/land agent.                           Environmental Exposure:                            WHO Recommendation    <20                           Occupational Exposure:                            OSHA Lead Std          40                            BEI                    30                                 Detection Limit =  1 This test was developed and its performance characteristics determined by [facility]. It has not been cleared or approved by the Food and Drug Administration.    Arsenic 6 2 - 23 ug/L    Comment:                                 Detection Limit = 1   Mercury None Detected 0.0 - 14.9 ug/L    Comment:                         Environmental Exposure:  <15.0                         Occupational Exposure:                          BEI - Inorganic Mercury: 15.0                                 Detection Limit =  1.0   Vitamin B6     Status: None   Collection Time: 06/19/18 11:50  AM  Result Value Ref Range   Vitamin B6 6.2 2.0 - 32.8 ug/L  Multiple Myeloma Panel (SPEP&IFE w/QIG)     Status: Abnormal   Collection Time: 06/19/18 11:50 AM  Result Value Ref Range   IgG (Immunoglobin G), Serum 874 586 - 1,602 mg/dL   IgA/Immunoglobulin A, Serum 56 (L) 87 - 352 mg/dL   IgM (Immunoglobulin M), Srm 64 26 - 217 mg/dL   Total Protein 6.5 6.0 - 8.5 g/dL   Albumin SerPl Elph-Mcnc 3.6 2.9 - 4.4 g/dL   Alpha 1 0.3 0.0 - 0.4  g/dL   Alpha2 Glob SerPl Elph-Mcnc 0.8 0.4 - 1.0 g/dL   B-Globulin SerPl Elph-Mcnc 1.0 0.7 - 1.3 g/dL   Gamma Glob SerPl Elph-Mcnc 0.7 0.4 - 1.8 g/dL   M Protein SerPl Elph-Mcnc Not Observed Not Observed g/dL   Globulin, Total 2.9 2.2 - 3.9 g/dL   Albumin/Glob SerPl 1.3 0.7 - 1.7   IFE 1 Comment     Comment: The immunofixation pattern appears unremarkable. Evidence of monoclonal protein is not apparent.    Please Note Comment     Comment: Protein electrophoresis scan will follow via computer, mail, or courier delivery.   Vitamin B1     Status: None   Collection Time: 06/19/18 11:50 AM  Result Value Ref Range   Thiamine 143.4 66.5 - 200.0 nmol/L      PHQ2/9: Depression screen [facility] 2/9 09/01/2018 03/13/2018 03/02/2018 08/30/2017 08/10/2017  Decreased Interest 0 0 0 0 0  Down, Depressed, Hopeless 0 0 0 1 1  PHQ - 2 Score 0 0 0 1 1  Altered sleeping 0 1 3 0 1  Tired, decreased energy 0 '2 1 1 1  ' Change in appetite 0 0 0 1 1  Feeling bad or failure about yourself  0 0 0 0 1  Trouble concentrating 0 0 0 0 0  Moving slowly or fidgety/restless 0 0 0 0 0  Suicidal thoughts 0 0 0 0 0  PHQ-9 Score 0 '3 4 3 5  ' Difficult doing work/chores - Not difficult at all Not difficult at all Not difficult at all Somewhat difficult    phq 9 is negative  Fall Risk: Fall Risk  09/01/2018 06/19/2018 03/13/2018 03/02/2018 08/30/2017  Falls in the past year? 0 1 0 1 No  Number falls in past yr: 0 1 0 0 -  Comment - pt states this was cardiac related - - -  Injury with Fall? 0 1 0 1 -  Comment - bumped head, CT ok per pt  - Bilateral Knees and left arm -  Risk for fall due to : - Other (Comment) - - -  Risk for fall due to: Comment - heart problems - - -  Follow up - Falls prevention discussed - - -     Functional Status Survey: Is the patient deaf or have difficulty hearing?: No Does the patient have difficulty seeing, even when wearing glasses/contacts?: Yes Does the patient have difficulty  concentrating, remembering, or making decisions?: No Does the patient have difficulty walking or climbing stairs?: No Does the patient have difficulty dressing or bathing?: No Does the patient have difficulty doing errands alone such as visiting a doctor's office or shopping?: No    Assessment & Plan    1. Diet-controlled type 2 diabetes mellitus (HCC)  - POCT HgB A1C  2. Need for 23-polyvalent pneumococcal polysaccharide vaccine  - Pneumococcal polysaccharide vaccine 23-valent greater than or equal to 2yo subcutaneous/IM  3. Mild major depression ([place])   4. Atrial flutter, unspecified type (HCC)  - metoprolol succinate (TOPROL-XL) 25 MG 24 hr tablet; Take 1 tablet (25 mg total) by mouth daily.  Dispense: 90 tablet; Refill: 0  5. Vitamin D deficiency  Continue supplementation   6. Fibromyalgia  - DULoxetine (CYMBALTA) 20 MG capsule; Take 2 capsules (40 mg total) by mouth daily.  Dispense: 180 capsule; Refill: 1  7. B12 deficiency  - B12  8. Dyslipidemia associated with type 2 diabetes mellitus ([place])   9. Migraine without aura and without status migrainosus, not intractable   10. OSA (obstructive sleep apnea)  She will get CPAP today   11. Long-term use of high-risk medication  - COMPLETE METABOLIC PANEL WITH GFR - CBC with Differential/Platelet - TSH

## 2018-09-02 LAB — CBC WITH DIFFERENTIAL/PLATELET
Absolute Monocytes: 454 cells/uL (ref 200–950)
Basophils Absolute: 53 cells/uL (ref 0–200)
Basophils Relative: 0.9 %
Eosinophils Absolute: 100 cells/uL (ref 15–500)
Eosinophils Relative: 1.7 %
HCT: 40 % (ref 35.0–45.0)
Hemoglobin: 13.1 g/dL (ref 11.7–15.5)
Lymphs Abs: 1363 cells/uL (ref 850–3900)
MCH: 30.8 pg (ref 27.0–33.0)
MCHC: 32.8 g/dL (ref 32.0–36.0)
MCV: 94.1 fL (ref 80.0–100.0)
MPV: 10.4 fL (ref 7.5–12.5)
Monocytes Relative: 7.7 %
Neutro Abs: 3929 cells/uL (ref 1500–7800)
Neutrophils Relative %: 66.6 %
Platelets: 317 10*3/uL (ref 140–400)
RBC: 4.25 10*6/uL (ref 3.80–5.10)
RDW: 13.4 % (ref 11.0–15.0)
Total Lymphocyte: 23.1 %
WBC: 5.9 10*3/uL (ref 3.8–10.8)

## 2018-09-02 LAB — VITAMIN B12: Vitamin B-12: 500 pg/mL (ref 200–1100)

## 2018-09-02 LAB — COMPLETE METABOLIC PANEL WITH GFR
AG Ratio: 1.9 (calc) (ref 1.0–2.5)
ALT: 11 U/L (ref 6–29)
AST: 12 U/L (ref 10–35)
Albumin: 4 g/dL (ref 3.6–5.1)
Alkaline phosphatase (APISO): 123 U/L (ref 37–153)
BUN/Creatinine Ratio: 21 (calc) (ref 6–22)
BUN: 21 mg/dL (ref 7–25)
CO2: 27 mmol/L (ref 20–32)
Calcium: 9.3 mg/dL (ref 8.6–10.4)
Chloride: 105 mmol/L (ref 98–110)
Creat: 1 mg/dL — ABNORMAL HIGH (ref 0.50–0.99)
GFR, Est African American: 68 mL/min/{1.73_m2} (ref >=60)
GFR, Est Non African American: 59 mL/min/{1.73_m2} — ABNORMAL LOW (ref >=60)
Globulin: 2.1 g/dL (calc) (ref 1.9–3.7)
Glucose, Bld: 93 mg/dL (ref 65–99)
Potassium: 4.7 mmol/L (ref 3.5–5.3)
Sodium: 142 mmol/L (ref 135–146)
Total Bilirubin: 0.3 mg/dL (ref 0.2–1.2)
Total Protein: 6.1 g/dL (ref 6.1–8.1)

## 2018-09-02 LAB — TSH: TSH: 2.73 mIU/L (ref 0.40–4.50)

## 2018-09-26 ENCOUNTER — Ambulatory Visit
Admission: RE | Admit: 2018-09-26 | Discharge: 2018-09-26 | Disposition: A | Discharge status: Home / Self Care | Payer: Medicare Other | Source: Ambulatory Visit | Attending: Surgery | Admitting: Surgery

## 2018-09-26 DIAGNOSIS — D0512 Intraductal carcinoma in situ of left breast: Secondary | ICD-10-CM | POA: Diagnosis not present

## 2018-09-26 DIAGNOSIS — R922 Inconclusive mammogram: Secondary | ICD-10-CM | POA: Diagnosis not present

## 2018-09-29 DIAGNOSIS — D2272 Melanocytic nevi of left lower limb, including hip: Secondary | ICD-10-CM | POA: Diagnosis not present

## 2018-09-29 DIAGNOSIS — L299 Pruritus, unspecified: Secondary | ICD-10-CM | POA: Diagnosis not present

## 2018-09-29 DIAGNOSIS — D225 Melanocytic nevi of trunk: Secondary | ICD-10-CM | POA: Diagnosis not present

## 2018-09-29 DIAGNOSIS — L988 Other specified disorders of the skin and subcutaneous tissue: Secondary | ICD-10-CM | POA: Diagnosis not present

## 2018-09-29 DIAGNOSIS — L853 Xerosis cutis: Secondary | ICD-10-CM | POA: Diagnosis not present

## 2018-09-29 DIAGNOSIS — L720 Epidermal cyst: Secondary | ICD-10-CM | POA: Diagnosis not present

## 2018-09-29 DIAGNOSIS — L821 Other seborrheic keratosis: Secondary | ICD-10-CM | POA: Diagnosis not present

## 2018-09-29 DIAGNOSIS — Z1283 Encounter for screening for malignant neoplasm of skin: Secondary | ICD-10-CM | POA: Diagnosis not present

## 2018-09-29 DIAGNOSIS — L578 Other skin changes due to chronic exposure to nonionizing radiation: Secondary | ICD-10-CM | POA: Diagnosis not present

## 2018-09-29 DIAGNOSIS — D1800 Hemangioma unspecified site: Secondary | ICD-10-CM | POA: Diagnosis not present

## 2018-09-29 DIAGNOSIS — L814 Other melanin hyperpigmentation: Secondary | ICD-10-CM | POA: Diagnosis not present

## 2018-09-29 NOTE — Progress Notes (Signed)
[facility] Consultation      Assessment and Plan:  Obstructive sleep apnea. -Mild OSA with AHI of 11.  Atrial fibrillation, depression, GERD. - Sleep apnea can contribute to above conditions, therefore treatment of sleep apnea is an important part of their management.  Possible COPD. - Patient is asymptomatic, however she does have hyperinflation of lungs on imaging.  She is a lifelong non-smoker, [place], she has significant secondhand smoke exposure as her father smoked 3 packs/day. - We will check a pulmonary function test, alpha-1.  Nocturnal hypoxemia. - Mild hypoxemia was seen overnight on the recent sleep study of uncertain etiology, patient denies dyspnea symptoms.  Suspect likely to desats from sleep apnea.   Orders Placed This Encounter  Procedures  . Alpha-1-antitrypsin  . Alpha-1 antitrypsin phenotype  . Pulmonary Function Test [facility] Only   Return in about 6 months (around 04/01/2019).   Date: 09/29/2018  MRN# [id number] Sonya Lopez 07/01/1955   Sonya Lopez is a 63 y.o. old female seen in consultation for chief complaint of:    Chief Complaint  Patient presents with  . Follow-up    wearing cpap avg 4-6hr nightly- mask occ leaks when pressure is strong.     HPI:  Sonya Lopez is a 63 y.o. old female with OSA, she has been using cpap nightly and is less sleepy during the day, and is not snoring.  She is using cpap every night, and cleans supplies once per week.  She feels that her breathing has been better, she would become somewhat short winded when active, but this has actually improved recently. She has never been a smoker, her father smoked 3ppd and [place]. Mother did not have respiratory problems.  She uses a full face mask, she notes that she wakes and finds that she wakes with leaks then takes it off in the morning so her total time is only about 4.5 hours  per night. She was initially using a different mask but switched to this one and found that she did better and using it most of the night.   **CPAP download 08/30/2018-09/28/2018>> raw data personally reviewed, usage greater than 4 hours 22/30 days.  Average usage on days used is 4 hours 38 minutes.  Pressure ranges 5-20.  Median pressure 10, 95% percentile pressure 13.6, maximum pressure 15.  Leaks are occasionally elevated.  Residual AHI is 1.2.  Overall this shows adequate compliance with good control of obstructive sleep apnea. **Chest x-ray 07/28/2018>> mild hyperinflation suggestive of COPD. **HST 08/14/2018>> mild obstructive sleep apnea with AHI of 11, recommended auto CPAP with pressure range 5-20. **07/18/2018 PSG>> At [facility]; overall total AHI was 4.5, with supine AHI of 12.  Medication:    Current Outpatient Medications:  .  amiodarone (PACERONE) 200 MG tablet, Take 1 tablet (200 mg total) by mouth as directed. Once daily and may take second pill if needed for breakthrough afib, Disp: 180 tablet, Rfl: 3 .  apixaban (ELIQUIS) 5 MG TABS tablet, Take 1 tablet (5 mg total) by mouth 2 (two) times daily., Disp: 180 tablet, Rfl: 3 .  Clindamycin Phosphate foam, Apply 1 Units topically as needed. For rosacea  , Disp: , Rfl: 5 .  DULoxetine (CYMBALTA) 20 MG capsule, Take 2 capsules (40 mg total) by mouth daily., Disp: 180 capsule, Rfl: 1 .  FINACEA 15 % FOAM, , Disp: , Rfl:  .  Fremanezumab-vfrm (AJOVY) 225 MG/1.5ML SOSY, Inject 225 mg into the skin every 30 (thirty) days., Disp: 6 Syringe, Rfl: 0 .  gabapentin (NEURONTIN) 100 MG capsule, TAKE 1 CAPSULE BY MOUTH TWICE DAILY, Disp: 180 capsule, Rfl: 1 .  magnesium oxide (MAG-OX) 400 MG tablet, Take 400 mg by mouth every morning. , Disp: , Rfl:  .  meloxicam (MOBIC) 15 MG tablet, Take 15 mg by mouth as needed. , Disp: , Rfl:  .  metoprolol succinate (TOPROL-XL) 25 MG 24 hr tablet, Take 1 tablet (25 mg total) by mouth daily., Disp: 90  tablet, Rfl: 0 .  mometasone (NASONEX) 50 MCG/ACT nasal spray, Place 2 sprays into the nose every morning. , Disp: , Rfl:  .  naratriptan (AMERGE) 2.5 MG tablet, TAKE 1 TABLET BY MOUTH AT ONSET OF HEADACHE. MAY REPEAT AFTER 4 HOURSIF RETURNS OR DOES NOT RESOLVE MAX OF 2 TABS PER DAY., Disp: 9 tablet, Rfl: 5 .  omeprazole (PRILOSEC) 20 MG capsule, TAKE 1 CAPSULE BY MOUTH EVERY DAY, Disp: 90 capsule, Rfl: 1 .  Probiotic Product (PROBIOTIC PO), Take 1 capsule by mouth daily. , Disp: , Rfl:  .  propranolol (INDERAL) 10 MG tablet, TAKE ONE TABLET EVERY DAY WHEN TAKING EXTRA FLECAINIDE FOR BREATHROUGH A-FIB, Disp: 30 tablet, Rfl: 1   Allergies:  Adhesive [tape], Levaquin  [levofloxacin], Macrodantin [nitrofurantoin], Metronidazole, Nitrofurantoin macrocrystal, Sulfamethoxazole-trimethoprim, Cephalexin, Clindamycin hcl, Doxycycline, and Erythromycin  Review of Systems:  Constitutional: Feels well. Cardiovascular: Denies chest pain, exertional chest pain.  Pulmonary: Denies hemoptysis, pleuritic chest pain.   The remainder of systems were reviewed and were found to be negative other than what is documented in the HPI.   Physical Examination:   VS: BP 126/74 (BP Location: Left Arm, Cuff Size: Normal)   Pulse (!) 55   Temp 97.8 F (36.6 C) (Temporal)   Ht 5\' 6"  (1.676 m)   Wt 212 lb 3.2 oz (96.3 kg)   SpO2 95%   BMI 34.25 kg/m   General Appearance: No distress  Neuro:without focal findings, mental status, speech normal, alert and oriented HEENT: PERRLA, EOM intact Pulmonary: No wheezing, No rales  CardiovascularNormal S1,S2.  No m/r/g.  Abdomen: Benign, Soft, non-tender, No masses Renal:  No costovertebral tenderness  GU:  No performed at this time. Endoc: No evident thyromegaly, no signs of acromegaly or Cushing features Skin:   warm, no rashes, no ecchymosis  Extremities: normal, no cyanosis, clubbing.    LABORATORY PANEL:   CBC No results for input(s): WBC, HGB, HCT, PLT in the  last 168 hours. ------------------------------------------------------------------------------------------------------------------  Chemistries  No results for input(s): NA, K, CL, CO2, GLUCOSE, BUN, CREATININE, CALCIUM, MG, AST, ALT, ALKPHOS, BILITOT in the last 168 hours.  Invalid input(s): GFRCGP ------------------------------------------------------------------------------------------------------------------  Cardiac Enzymes No results for input(s): TROPONINI in the last 168 hours. ------------------------------------------------------------  RADIOLOGY:  No results found.     Thank  you for the consultation and for allowing [facility] to assist in the care of your patient. Our recommendations are noted above.  Please contact us if we can be of further service.   Marda Stalker, M.D., F.C.C.P.  Board Certified in Internal Medicine, Pulmonary Medicine, [place], and Sleep Medicine.  [facility] Office Number: [phone]   09/29/2018

## 2018-10-02 ENCOUNTER — Other Ambulatory Visit: Payer: Self-pay

## 2018-10-02 ENCOUNTER — Other Ambulatory Visit
Admission: RE | Admit: 2018-10-02 | Discharge: 2018-10-02 | Disposition: A | Discharge status: Home / Self Care | Payer: Medicare Other | Source: Ambulatory Visit | Attending: Internal Medicine | Admitting: Internal Medicine

## 2018-10-02 ENCOUNTER — Ambulatory Visit (INDEPENDENT_AMBULATORY_CARE_PROVIDER_SITE_OTHER): Payer: Medicare Other | Admitting: Internal Medicine

## 2018-10-02 ENCOUNTER — Encounter: Payer: Self-pay | Admitting: Internal Medicine

## 2018-10-02 VITALS — BP 126/74 | HR 55 | Temp 97.8°F | Ht 66.0 in | Wt 212.2 lb

## 2018-10-02 DIAGNOSIS — G4733 Obstructive sleep apnea (adult) (pediatric): Secondary | ICD-10-CM

## 2018-10-02 DIAGNOSIS — Z23 Encounter for immunization: Secondary | ICD-10-CM | POA: Diagnosis not present

## 2018-10-02 DIAGNOSIS — R0609 Other forms of dyspnea: Secondary | ICD-10-CM

## 2018-10-02 DIAGNOSIS — J449 Chronic obstructive pulmonary disease, unspecified: Secondary | ICD-10-CM | POA: Insufficient documentation

## 2018-10-02 NOTE — Patient Instructions (Addendum)
Use the cpap every night for the whole night.  Will send for lung function test before next visit.   Will send for blood test.

## 2018-10-04 ENCOUNTER — Other Ambulatory Visit: Payer: Self-pay | Admitting: Internal Medicine

## 2018-10-04 DIAGNOSIS — R0609 Other forms of dyspnea: Secondary | ICD-10-CM

## 2018-10-04 DIAGNOSIS — R0602 Shortness of breath: Secondary | ICD-10-CM

## 2018-10-05 LAB — ALPHA-1 ANTITRYPSIN PHENOTYPE: A-1 Antitrypsin, Ser: 136 mg/dL (ref 101–187)

## 2018-10-11 ENCOUNTER — Ambulatory Visit: Payer: Medicare Other | Admitting: Surgery

## 2018-10-25 ENCOUNTER — Other Ambulatory Visit: Payer: Self-pay | Admitting: Cardiovascular Disease

## 2018-10-25 DIAGNOSIS — I4892 Unspecified atrial flutter: Secondary | ICD-10-CM

## 2018-10-25 NOTE — Telephone Encounter (Signed)
Please advise if ok to refill Metoprolol succinate 25 mg tablet 0.5 mg qd. Medication last filled by PCP.

## 2018-10-26 ENCOUNTER — Other Ambulatory Visit: Payer: Self-pay | Admitting: *Deleted

## 2018-10-26 DIAGNOSIS — I4892 Unspecified atrial flutter: Secondary | ICD-10-CM

## 2018-10-26 MED ORDER — METOPROLOL SUCCINATE ER 25 MG PO TB24: 25.0000 mg | ORAL_TABLET | Freq: Every day | ORAL | 2 refills | Status: DC

## 2018-10-30 ENCOUNTER — Telehealth: Payer: Self-pay | Admitting: Internal Medicine

## 2018-10-30 DIAGNOSIS — G4733 Obstructive sleep apnea (adult) (pediatric): Secondary | ICD-10-CM

## 2018-10-31 NOTE — Telephone Encounter (Signed)
Yes ok to change

## 2018-10-31 NOTE — Telephone Encounter (Signed)
Called and spoke to pt.  Pt stated that her cpap mask is leaking. Pt feels that pressure is too strong causing the mask to leak.  Pt stated that per [employer] pressure is set for 5-20cm, however pt's average pressure is 13. Pt is requesting that pressure be changed to 13.  DK please advise. Thanks

## 2018-10-31 NOTE — Telephone Encounter (Signed)
Order has bene placed to adapt to change cpap settings. Pt is aware and voiced her understanding.

## 2018-11-13 DIAGNOSIS — H43391 Other vitreous opacities, right eye: Secondary | ICD-10-CM | POA: Diagnosis not present

## 2018-11-13 DIAGNOSIS — H25813 Combined forms of age-related cataract, bilateral: Secondary | ICD-10-CM | POA: Diagnosis not present

## 2018-11-22 ENCOUNTER — Encounter: Payer: Self-pay | Admitting: *Deleted

## 2018-11-23 DIAGNOSIS — C50412 Malignant neoplasm of upper-outer quadrant of left female breast: Secondary | ICD-10-CM | POA: Diagnosis not present

## 2018-11-27 ENCOUNTER — Other Ambulatory Visit: Payer: Self-pay

## 2018-11-28 ENCOUNTER — Encounter: Payer: Self-pay | Admitting: Radiation Oncology

## 2018-11-28 ENCOUNTER — Ambulatory Visit
Admission: RE | Admit: 2018-11-28 | Discharge: 2018-11-28 | Disposition: A | Discharge status: Home / Self Care | Payer: Medicare Other | Source: Ambulatory Visit | Attending: Radiation Oncology | Admitting: Radiation Oncology

## 2018-11-28 ENCOUNTER — Other Ambulatory Visit: Payer: Self-pay

## 2018-11-28 VITALS — BP 128/59 | HR 51 | Temp 98.0°F | Wt 215.4 lb

## 2018-11-28 DIAGNOSIS — Z923 Personal history of irradiation: Secondary | ICD-10-CM | POA: Insufficient documentation

## 2018-11-28 DIAGNOSIS — Z170 Estrogen receptor positive status [ER+]: Secondary | ICD-10-CM

## 2018-11-28 DIAGNOSIS — Z86000 Personal history of in-situ neoplasm of breast: Secondary | ICD-10-CM | POA: Diagnosis not present

## 2018-11-28 DIAGNOSIS — C50412 Malignant neoplasm of upper-outer quadrant of left female breast: Secondary | ICD-10-CM

## 2018-11-28 NOTE — Progress Notes (Signed)
Radiation Oncology Follow up Note  Name: Sonya Lopez   Date:   11/28/2018 MRN:  [id number] DOB: Jun 26, 1955    This 63 y.o. female presents to the clinic today for 3-year follow-up status post accelerated partial breast radiation to her left breast for ductal carcinoma in situ ER/PR positive.  REFERRING PROVIDER: Steele Sizer, MD  HPI: Patient is a 63 year old female now out 3 years having completed accelerated partial breast radiation to her left breast for ER/PR positive ductal carcinoma in situ.  Patient is not on antiestrogen therapy based on the side effect profile.  Her last mammogram.  Was back in August was BI-RADS 2 benign which I have reviewed.  She specifically denies breast tenderness cough or bone pain.  COMPLICATIONS OF TREATMENT: none  FOLLOW UP COMPLIANCE: keeps appointments   PHYSICAL EXAM:  BP (!) 128/59   Pulse (!) 51   Temp 98 F (36.7 C)   Wt 215 lb 6.4 oz (97.7 kg)   BMI 34.77 kg/m  Lungs are clear to A&P cardiac examination essentially unremarkable with regular rate and rhythm. No dominant mass or nodularity is noted in either breast in 2 positions examined. Incision is well-healed. No axillary or supraclavicular adenopathy is appreciated. Cosmetic result is excellent.  Well-developed well-nourished patient in NAD. HEENT reveals PERLA, EOMI, discs not visualized.  Oral cavity is clear. No oral mucosal lesions are identified. Neck is clear without evidence of cervical or supraclavicular adenopathy. Lungs are clear to A&P. Cardiac examination is essentially unremarkable with regular rate and rhythm without murmur rub or thrill. Abdomen is benign with no organomegaly or masses noted. Motor sensory and DTR levels are equal and symmetric in the upper and lower extremities. Cranial nerves II through XII are grossly intact. Proprioception is intact. No peripheral adenopathy or edema is identified. No motor or sensory levels are noted. Crude visual fields are within  normal range.  RADIOLOGY RESULTS: Mammograms reviewed compatible with above-stated findings  PLAN: Present time patient is doing well.  With no evidence of disease.  I am pleased with her overall progress.  I have set her up for 1 year follow-up and then will discontinue follow-up care.  She is already scheduled for follow-up mammograms next August.  Again she is not on antiestrogen therapy based on personal decision and side effect profile.  Patient is to call with any concerns.  I would like to take this opportunity to thank you for allowing me to participate in the care of your patient.Noreene Filbert, MD

## 2018-12-12 ENCOUNTER — Encounter: Payer: Self-pay | Admitting: Cardiovascular Disease

## 2018-12-12 ENCOUNTER — Other Ambulatory Visit: Payer: Self-pay

## 2018-12-12 ENCOUNTER — Ambulatory Visit (INDEPENDENT_AMBULATORY_CARE_PROVIDER_SITE_OTHER): Payer: Medicare Other | Admitting: Cardiovascular Disease

## 2018-12-12 VITALS — BP 138/66 | HR 49 | Ht 66.0 in | Wt 215.0 lb

## 2018-12-12 DIAGNOSIS — E782 Mixed hyperlipidemia: Secondary | ICD-10-CM

## 2018-12-12 DIAGNOSIS — E119 Type 2 diabetes mellitus without complications: Secondary | ICD-10-CM | POA: Diagnosis not present

## 2018-12-12 DIAGNOSIS — I483 Typical atrial flutter: Secondary | ICD-10-CM

## 2018-12-12 DIAGNOSIS — R55 Syncope and collapse: Secondary | ICD-10-CM | POA: Diagnosis not present

## 2018-12-12 DIAGNOSIS — I480 Paroxysmal atrial fibrillation: Secondary | ICD-10-CM | POA: Diagnosis not present

## 2018-12-12 NOTE — Progress Notes (Signed)
Date:  12/12/2018   ID:  Sonya Lopez, DOB August 26, 1955, MRN [id number]  Patient Location:  [place] [address]   Provider location:   [facility], [place] office  PCP:  Steele Sizer, MD  Cardiologist:  Patsy Baltimore  Chief Complaint  Patient presents with  . Other    6 month follow up. patient c/o some chest tightness with exertion. Meds reviewed verbally with patient.      History of Present Illness:    Sonya Lopez is a 63 y.o. female  past medical history of Diabetes  GERD hospital admission in 04/16/2011 for atrial flutter with RVR.  CT coronary calcium score zero 09/2015 Father had significant history of coronary artery disease but was a heavy smoker She presents for routine followup of her atrial fibrillation and flutter  In follow-up today she continues to have episodes of atrial fibrillation She has a watch and able to print up the telemetry strips clearly documenting atrial fibrillation Having 4-5 episodes per month lasting 3 hours at a time No significant improvements with recent CPAP Findings she is having symptomatic bradycardia with the metoprolol and amiodarone  Recently struggling with her weight  Previously tried flecainide but this did not control the atrial fibrillation very well  Denies any near-syncope or syncope  EKG personally reviewed by myself on todays visit Shows sinus bradycardia rate 49 bpm short PR no significant ST-T wave changes  Other past medical hx ER 05/06/18:  for atrial fib, started flecainide 4:30 AM Stood up out of bed, syncope, before flecainide x2 Hospital records reviewed with the patient in detail took 2 flecainide and a dose of propranolol.  After taking those medications patient had a syncopal event x2. She denies injuring herself. She woke up on the floor. -When EMS arrived patient was found to be hypotensive and orthostatics with systolics in the [id number] - started on  IV fluids -Patient opted for cardioversion.   Syncope was before the prorpanolol Atrial fib rate was 105-110    Prior CV studies:   The following studies were reviewed today:   Past Medical History:  Diagnosis Date  . Allergy   . Anxiety    situational anxiety  . Arrhythmia    A-fib/A-flutter  . Atrial flutter ([place])   . 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  MammoSite  . Cancer [facility]) 2017   Left- DCIS  . Depression   . Diet-controlled type 2 diabetes mellitus ([place])   . Difficult intubation 2009   [place] had to use  'boogee"  . Female cystocele 01/05/2011  . Fibromyalgia   . GERD (gastroesophageal reflux disease)   . Headache(784.0)   . Kidney stones   . Obesity   . Personal history of radiation therapy 2017   LEFT lumpectomy  . PONV (postoperative nausea and vomiting)    h/o difficult intubation and anes had to use "boogee"  . Vitreous detachment of right eye    sees floaters   Past Surgical History:  Procedure Laterality Date  . ABDOMINAL HYSTERECTOMY  2013  . ANTERIOR AND POSTERIOR REPAIR  11/30/2011   Procedure: ANTERIOR (CYSTOCELE) AND POSTERIOR REPAIR (RECTOCELE);  Surgeon: Reece Packer, MD;  Location: [facility];  Service: Urology;  Laterality: N/A;  with cysto  . BLADDER SUSPENSION  11/30/2011   Procedure: [facility] PROCEDURE;  Surgeon: Reece Packer, MD;  Location: [facility]  [facility];  Service: Urology;  Laterality: N/A;  graft 10x6  . BREAST BIOPSY Right 2007   negative core  . BREAST BIOPSY Left 09/16/2015   DCIS  . BREAST LUMPECTOMY Left 09/30/2015   DCIS clear margins and rad tx/HIGH GRADE DUCTAL CARCINOMA IN SITU, COMEDO TYPE  . BREAST LUMPECTOMY WITH NEEDLE LOCALIZATION Left 09/30/2015   DCIS excision, MammoSite radiation;  Surgeon: Robert Bellow, MD;  Location: [facility] ORS;  Service: General;  Laterality: Left;  . CESAREAN SECTION     x 1  . CHOLECYSTECTOMY  2005   . COLONOSCOPY  2009  . CYSTOSCOPY  11/30/2011   Procedure: CYSTOSCOPY;  Surgeon: Reece Packer, MD;  Location: [facility];  Service: Urology;;  . Brigitte Pulse AND CURETTAGE OF UTERUS  05/05/2007  . GALLBLADDER SURGERY  2004  . TONSILLECTOMY  1961  . VAGINAL HYSTERECTOMY  11/30/2011   Procedure: HYSTERECTOMY VAGINAL;  Surgeon: Emily Filbert, MD;  Location: [facility];  Service: Gynecology;  Laterality: N/A;     Current Meds  Medication Sig  . amiodarone (PACERONE) 200 MG tablet Take 1 tablet (200 mg total) by mouth as directed. Once daily and may take second pill if needed for breakthrough afib  . apixaban (ELIQUIS) 5 MG TABS tablet Take 1 tablet (5 mg total) by mouth 2 (two) times daily.  . DULoxetine (CYMBALTA) 20 MG capsule Take 2 capsules (40 mg total) by mouth daily.  Marland Kitchen FINACEA 15 % FOAM   . Fremanezumab-vfrm (AJOVY) 225 MG/1.5ML SOSY Inject 225 mg into the skin every 30 (thirty) days.  Marland Kitchen gabapentin (NEURONTIN) 100 MG capsule TAKE 1 CAPSULE BY MOUTH TWICE DAILY  . magnesium oxide (MAG-OX) 400 MG tablet Take 400 mg by mouth every morning.   . metoprolol succinate (TOPROL-XL) 25 MG 24 hr tablet Take 1 tablet (25 mg total) by mouth daily.  . mometasone (NASONEX) 50 MCG/ACT nasal spray Place 2 sprays into the nose every morning.   . naratriptan (AMERGE) 2.5 MG tablet TAKE 1 TABLET BY MOUTH AT ONSET OF HEADACHE. MAY REPEAT AFTER 4 HOURSIF RETURNS OR DOES NOT RESOLVE MAX OF 2 TABS PER DAY.  Marland Kitchen omeprazole (PRILOSEC) 20 MG capsule TAKE 1 CAPSULE BY MOUTH EVERY DAY  . Probiotic Product (PROBIOTIC PO) Take 1 capsule by mouth daily.   . propranolol (INDERAL) 10 MG tablet TAKE ONE TABLET EVERY DAY WHEN TAKING EXTRA FLECAINIDE FOR BREATHROUGH A-FIB     Allergies:   Adhesive [tape], Levaquin  [levofloxacin], Macrodantin [nitrofurantoin], Metronidazole, Nitrofurantoin macrocrystal, Sulfamethoxazole-trimethoprim, Cephalexin, Clindamycin hcl, Doxycycline, and Erythromycin   Social History   Tobacco Use  .  Smoking status: Never Smoker  . Smokeless tobacco: Never Used  Substance Use Topics  . Alcohol use: Not Currently    Alcohol/week: 0.0 standard drinks    Frequency: Never    Comment: Due to AF  . Drug use: No      Family Hx: [name] family history includes Asthma in her daughter, daughter, and daughter; Colitis in her daughter; Diabetes in her brother and mother; Fibromyalgia in her daughter; Hearing loss in her mother; Heart disease in her father and paternal grandfather; Hypertension in her brother and mother; Interstitial cystitis in her daughter; Migraines in her daughter; Thyroid cancer (age of onset: 68) in her daughter. There is no history of Breast cancer.  ROS:   Please see the history of present illness.    Review of Systems  Constitutional: Negative.   HENT: Negative.   Respiratory: Negative.   Cardiovascular:  Negative.        Rare tachycardia episodes  Gastrointestinal: Negative.   Musculoskeletal: Negative.   Neurological: Negative.   Psychiatric/Behavioral: Negative.   All other systems reviewed and are negative.    Labs/Other Tests and Data Reviewed:    Recent Labs: 05/06/2018: Magnesium 2.1 09/01/2018: ALT 11; BUN 21; Creat 1.00; Hemoglobin 13.1; Platelets 317; Potassium 4.7; Sodium 142; TSH 2.73   Recent Lipid Panel Lab Results  Component Value Date/Time   CHOL 186 03/02/2018 10:00 AM   CHOL 209 (H) 06/04/2015 07:53 AM   CHOL 164 04/17/2011 05:07 AM   TRIG 87 03/02/2018 10:00 AM   TRIG 83 04/17/2011 05:07 AM   HDL 60 03/02/2018 10:00 AM   HDL 58 06/04/2015 07:53 AM   HDL 43 04/17/2011 05:07 AM   CHOLHDL 3.1 03/02/2018 10:00 AM   LDLCALC 108 (H) 03/02/2018 10:00 AM   LDLCALC 104 (H) 04/17/2011 05:07 AM    Wt Readings from Last 3 Encounters:  12/12/18 215 lb (97.5 kg)  11/28/18 215 lb 6.4 oz (97.7 kg)  10/02/18 212 lb 3.2 oz (96.3 kg)     Exam:    BP 138/66 (BP Location: Left Arm, Patient Position: Sitting, Cuff Size: Normal)   Pulse (!)  49   Ht 5\' 6"  (1.676 m)   Wt 215 lb (97.5 kg)   BMI 34.70 kg/m   Constitutional:  oriented to person, place, and time. No distress.  HENT:  Head: Grossly normal Eyes:  no discharge. No scleral icterus.  Neck: No JVD, no carotid bruits  Cardiovascular: Regular rate and rhythm, no murmurs appreciated Pulmonary/Chest: Clear to auscultation bilaterally, no wheezes or rails Abdominal: Soft.  no distension.  no tenderness.  Musculoskeletal: Normal range of motion Neurological:  normal muscle tone. Coordination normal. No atrophy Skin: Skin warm and dry Psychiatric: normal affect, pleasant,3  ASSESSMENT & PLAN:    Atrial fibrillation/ flutter, unspecified type ([place]) Previously did not hold on flecainide Now with symptomatic bradycardia on very low-dose metoprolol and amiodarone.  Heart rate 49 on today's visit Still having breakthrough atrial fibrillation She is interested in ablation given symptomatic atrial fibrillation She is on CPAP On Eliquis Long discussion concerning risk and benefit of ablation Referral has been placed to EP for consideration of ablation She is not interested in trying Tikosyn  History of syncope Suspect secondary to orthostasis in the past No recent episodes Unable to advance her medications  Hyperlipidemia  recommended weight loss   Total encounter time more than 25 minutes  Greater than 50% was spent in counseling and coordination of care with the patient   Disposition: Follow-up in 6 months   Signed, Ida Rogue, MD  12/12/2018 11:42 AM    [place] Office [address], [place], [place] [address]

## 2018-12-12 NOTE — Patient Instructions (Addendum)
We will place a referral to EP in [place] Dr. Curt Bears for consideration of ablation  Medication Instructions:  No changes  If you need a refill on your cardiac medications before your next appointment, please call your pharmacy.    Lab work: No new labs needed   If you have labs (blood work) drawn today and your tests are completely normal, you will receive your results only by: Marland Kitchen MyChart Message (if you have MyChart) OR . A paper copy in the mail If you have any lab test that is abnormal or we need to change your treatment, we will call you to review the results.   Testing/Procedures: No new testing needed   Follow-Up: At [facility], you and your health needs are our priority.  As part of our continuing mission to provide you with exceptional heart care, we have created designated Provider Care Teams.  These Care Teams include your primary Cardiologist (physician) and Advanced Practice Providers (APPs -  Physician Assistants and Nurse Practitioners) who all work together to provide you with the care you need, when you need it.  . You will need a follow up appointment in 6 months .   Please call our office 2 months in advance to schedule this appointment.    . Providers on your designated Care Team:   . Murray Hodgkins, NP . Christell Faith, PA-C . Marrianne Mood, PA-C  Any Other Special Instructions Will Be Listed Below (If Applicable).  For educational health videos Log in to : [email or web address] Or : [email or web address], password : [email or web address]

## 2018-12-14 DIAGNOSIS — H25812 Combined forms of age-related cataract, left eye: Secondary | ICD-10-CM | POA: Diagnosis not present

## 2018-12-14 DIAGNOSIS — Z20828 Contact with and (suspected) exposure to other viral communicable diseases: Secondary | ICD-10-CM | POA: Diagnosis not present

## 2018-12-20 DIAGNOSIS — H269 Unspecified cataract: Secondary | ICD-10-CM | POA: Diagnosis not present

## 2019-01-02 ENCOUNTER — Ambulatory Visit (INDEPENDENT_AMBULATORY_CARE_PROVIDER_SITE_OTHER): Payer: Medicare Other | Admitting: Cardiology

## 2019-01-02 ENCOUNTER — Other Ambulatory Visit: Payer: Self-pay

## 2019-01-02 ENCOUNTER — Encounter: Payer: Self-pay | Admitting: Cardiology

## 2019-01-02 VITALS — BP 150/76 | HR 51 | Ht 66.0 in | Wt 212.0 lb

## 2019-01-02 DIAGNOSIS — I480 Paroxysmal atrial fibrillation: Secondary | ICD-10-CM | POA: Diagnosis not present

## 2019-01-02 HISTORY — PX: CATARACT EXTRACTION: SUR2

## 2019-01-02 NOTE — Patient Instructions (Addendum)
Medication Instructions:  Your physician recommends that you continue on your current medications as directed. Please refer to the Current Medication list given to you today.  *If you need a refill on your cardiac medications before your next appointment, please call your pharmacy.  Labwork: You will get lab work within 30 days of your procedure on: ____________ If you have labs (blood work) drawn today and your tests are completely normal, you will receive your results only by:  [place] (if you have MyChart) OR  A paper copy in the mail If you have any lab test that is abnormal or we need to change your treatment, we will call you to review the results.  Testing/Procedures: Your physician has requested that you have cardiac CT within 7 days prior to your ablation. Cardiac computed tomography (CT) is a painless test that uses an x-ray machine to take clear, detailed pictures of your heart. For further information please visit [email or web address]. Please follow instruction below located under special instructions. You will get a call from our office to schedule the date for this test.  Your physician has recommended that you have an ablation. Catheter ablation is a medical procedure used to treat some cardiac arrhythmias (irregular heartbeats). During catheter ablation, a long, thin, flexible tube is put into a blood vessel in your groin (upper thigh), or neck. This tube is called an ablation catheter. It is then guided to your heart through the blood vessel. Radio frequency waves destroy small areas of heart tissue where abnormal heartbeats may cause an arrhythmia to start. Please see the instructions below located under special instructions  Follow-Up: Your physician recommends that you schedule a follow-up appointment in: 4 weeks, after your procedure on ____________, with Roderic Palau NP in the AFib clinic.  Your physician recommends that you schedule a follow up appointment in: 3  months, after your procedure on ____________, with Dr. Curt Bears.  *Please note that any paperwork needing to be filled out by the provider will need to be addressed at the front desk prior to seeing the provider. Please note that any FMLA, disability or other documents regarding health condition is subject to a $25.00 charge that must be received prior to completion of paperwork in the form of a money order or check.  Thank you for choosing [facility]!! Sonya Curet, RN [phone]   Any Other Special Instructions Will Be Listed Below    CT INSTRUCTIONS Your cardiac CT will be scheduled at:   [facility] [address] [place],  [address] [phone]  Please arrive at the [facility] main entrance of [facility] at ___________. Please arrive 30-45 minutes prior to test start time. Proceed to the [facility] Radiology Department (first floor) to check-in and test prep.  Please follow these instructions carefully (unless otherwise directed):   On the Night Before the Test: . Be sure to Drink plenty of water. . Do not consume any caffeinated/decaffeinated beverages or chocolate 12 hours prior to your test. . Do not take any antihistamines 12 hours prior to your test.  On the Day of the Test: . Drink plenty of water. Do not drink any water within one hour of the test. . Do not eat any food 4 hours prior to the test. . You may take your regular medications prior to the test.  . Take metoprolol (Lopressor) two hours prior to test. . HOLD Furosemide/Hydrochlorothiazide morning of the test. . FEMALES- please wear underwire-free bra if available  After the Test: . Drink plenty of water. . After receiving IV contrast, you may experience a mild flushed feeling. This is normal. . On occasion, you may experience a mild rash up to 24 hours after the test. This is not dangerous. If this occurs, you can take Benadryl 25 mg and increase your fluid  intake. . If you experience trouble breathing, this can be serious. If it is severe call 911 IMMEDIATELY. If it is mild, please call our office. . If you take any of these medications: Glipizide/Metformin, Avandament, Glucavance, please do not take 48 hours after completing test.  Please contact the cardiac imaging nurse navigator should you have any questions/concerns Marchia Bond, RN Navigator Cardiac [place] [facility] [phone] Office  [phone] Cell       Electrophysiology/Ablation Procedure Instructions   You are scheduled for a(n)  ablation on _________ with Dr. Allegra Lai.   1.   Pre procedure testing-             A.  LAB WORK --- On _________ you will first go to the [facility] office (see address at the top of this letter) at _________ for your pre procedure blood work.                 B. COVID TEST-- On ________ @ _________ after you get your lab work- You will go to [facility] ([place]) for your Covid testing.   This is a drive thru test site.  There will be multiple testing areas.  Be sure to share with the first checkpoint that you are there for pre-procedure/surgery testing. This will put you into the right (yellow) lane that leads to the PAT testing team. Stay in your car and the nurse team will come to your car to test you.  After you are tested please go home and self quarantine until the day of your procedure.     2. On the day of your procedure ___________ you will go to [facility] hospital [phone] [address]) at _________.  You will go to the main entrance [address] [employer]) and enter where the [employer] parking staff are.  Your driver will drop you off and you will head down the hallway to ADMITTING.  You may have one support person come in to the hospital with you.  They will be asked to wait in the waiting room.   3.   Do not eat or drink after midnight prior to your procedure.   4.   Do NOT take any  medications the morning of your procedure.   5.  Plan for an overnight stay.  If you use your phone frequently bring your phone charger.   6. You will follow up with the AFIB clinic 4 weeks after your procedure.  You will follow up with Dr. Curt Bears  3 months after your procedure.  These appointments will be made for you.   * If you have ANY questions please call the office [phone] and ask for Bradin Mcadory RN or send me a MyChart message   * Occasionally, EP Studies and ablations can become lengthy.  Please make your family aware of this before your procedure starts.  Average time ranges from 2-8 hours for EP studies/ablations.  Your physician will call your family after the procedure with the results.  Cardiac Ablation Cardiac ablation is a procedure to disable (ablate) a small amount of heart tissue in very specific places. The heart has many electrical connections. Sometimes these connections are abnormal and can cause the heart to beat very fast or irregularly. Ablating some of the problem areas can improve the heart rhythm or return it to normal. Ablation may be done for people who:  Have Wolff-[name]-White syndrome.  Have fast heart rhythms (tachycardia).  Have taken medicines for an abnormal heart rhythm (arrhythmia) that were not effective or caused side effects.  Have a high-risk heartbeat that may be life-threatening. During the procedure, a small incision is made in the neck or the groin, and a long, thin, flexible tube (catheter) is inserted into the incision and moved to the heart. Small devices (electrodes) on the tip of the catheter will send out electrical currents. A type of X-ray (fluoroscopy) will be used to help guide the catheter and to provide images of the heart. Tell a health care provider about:  Any allergies you have.  All medicines you are taking, including vitamins, herbs, eye drops, creams, and over-the-counter  medicines.  Any problems you or family members have had with anesthetic medicines.  Any blood disorders you have.  Any surgeries you have had.  Any medical conditions you have, such as kidney failure.  Whether you are pregnant or may be pregnant. What are the risks? Generally, this is a safe procedure. However, problems may occur, including:  Infection.  Bruising and bleeding at the catheter insertion site.  Bleeding into the chest, especially into the sac that surrounds the heart. This is a serious complication.  Stroke or blood clots.  Damage to other structures or organs.  Allergic reaction to medicines or dyes.  Need for a permanent pacemaker if the normal electrical system is damaged. A pacemaker is a small computer that sends electrical signals to the heart and helps your heart beat normally.  The procedure not being fully effective. This may not be recognized until months later. Repeat ablation procedures are sometimes required. What happens before the procedure?  Follow instructions from your health care provider about eating or drinking restrictions.  Ask your health care provider about: ? Changing or stopping your regular medicines. This is especially important if you are taking diabetes medicines or blood thinners. ? Taking medicines such as aspirin and ibuprofen. These medicines can thin your blood. Do not take these medicines before your procedure if your health care provider instructs you not to.  Plan to have someone take you home from the hospital or clinic.  If you will be going home right after the procedure, plan to have someone with you for 24 hours. What happens during the procedure?  To lower your risk of infection: ? Your health care team will wash or sanitize their hands. ? Your skin will be washed with soap. ? Hair may be removed from the incision area.  An IV tube will be inserted into one of your veins.  You will be given a medicine to help  you relax (sedative).  The skin on your neck or groin will be numbed.  An incision will be made in your neck or your groin.  A needle will be inserted through the incision and into a large vein in your neck or groin.  A catheter will be inserted into the needle and moved to your heart.  Dye may be injected through the catheter to help your surgeon see the area of the  heart that needs treatment.  Electrical currents will be sent from the catheter to ablate heart tissue in desired areas. There are three types of energy that may be used to ablate heart tissue: ? Heat (radiofrequency energy). ? Laser energy. ? Extreme cold (cryoablation).  When the necessary tissue has been ablated, the catheter will be removed.  Pressure will be held on the catheter insertion area to prevent excessive bleeding.  A bandage (dressing) will be placed over the catheter insertion area. The procedure may vary among health care providers and hospitals. What happens after the procedure?  Your blood pressure, heart rate, breathing rate, and blood oxygen level will be monitored until the medicines you were given have worn off.  Your catheter insertion area will be monitored for bleeding. You will need to lie still for a few hours to ensure that you do not bleed from the catheter insertion area.  Do not drive for 24 hours or as long as directed by your health care provider. Summary  Cardiac ablation is a procedure to disable (ablate) a small amount of heart tissue in very specific places. Ablating some of the problem areas can improve the heart rhythm or return it to normal.  During the procedure, electrical currents will be sent from the catheter to ablate heart tissue in desired areas. This information is not intended to replace advice given to you by your health care provider. Make sure you discuss any questions you have with your health care provider. Document Released: 06/06/2008 Document Revised:  07/11/2017 Document Reviewed: 12/08/2015 Elsevier Patient Education  [place].    Atrial Fibrillation Atrial fibrillation is a type of irregular or rapid heartbeat (arrhythmia). In atrial fibrillation, the top part of the heart (atria) quivers in a chaotic pattern. This makes the heart unable to pump blood normally. Having atrial fibrillation can increase your risk for other health problems, such as:  Blood can pool in the atria and form clots. If a clot travels to the brain, it can cause a stroke.  The heart muscle may weaken from the irregular blood flow. This can cause heart failure. Atrial fibrillation may start suddenly and stop on its own, or it may become a long-lasting problem. What are the causes? This condition is caused by some heart-related conditions or procedures, including:  High blood pressure. This is the most common cause.  Heart failure.  Heart valve conditions.  Inflammation of the sac that surrounds the heart (pericarditis).  Heart surgery.  Coronary artery disease.  Certain heart rhythm disorders, such as Wolf-[name]-White syndrome. Other causes include:  Pneumonia.  Obstructive sleep apnea.  Lung cancer.  Thyroid problems, especially if the thyroid is overactive (hyperthyroidism).  Excessive alcohol or drug use. Sometimes, the cause of this condition is not known. What increases the risk? This condition is more likely to develop in:  Older people.  People who smoke.  People who have diabetes mellitus.  People who are overweight (obese).  Athletes who exercise vigorously.  People who have a family history. What are the signs or symptoms? Symptoms of this condition include:  A feeling that your heart is beating rapidly or irregularly.  A feeling of discomfort or pain in your chest.  Shortness of breath.  Sudden light-headedness or weakness.  Getting tired easily during exercise. In some cases, there are no  symptoms. How is this diagnosed? Your health care provider may be able to detect atrial fibrillation when taking your pulse. If detected, this condition may be diagnosed  with:  Electrocardiogram (ECG).  Ambulatory cardiac monitor. This device records your heartbeats for 24 hours or more.  Transthoracic echocardiogram (TTE) to evaluate how blood flows through your heart.  Transesophageal echocardiogram (TEE) to view more detailed images of your heart.  A stress test.  Imaging tests, such as a CT scan or chest X-ray.  Blood tests. How is this treated? This condition may be treated with:  Medicines to slow down the heart rate or bring the heart's rhythm back to normal.  Medicines to prevent blood clots from forming.  Electrical cardioversion. This delivers a low-energy shock to the heart to reset its rhythm.  Ablation. This procedure destroys the part of the heart tissue that sends abnormal signals.  Left atrial appendage occlusion/excision. This seals off a common place in the atria where blood clots can form (left atrial appendage). The goal of treatment is to prevent blood clots from forming and to keep your heart beating at a normal rate and rhythm. Treatment depends on underlying medical conditions and how you feel when you are experiencing fibrillation. Follow these instructions at home: Medicines  Take over-the counter and prescription medicines only as told by your health care provider.  If your health care provider prescribed a blood-thinning medicine (anticoagulant), take it exactly as told. Taking too much blood-thinning medicine can cause bleeding. Taking too little can enable a blood clot to form and travel to the brain, causing a stroke. Lifestyle      Do not use any products that contain nicotine or tobacco, such as cigarettes and e-cigarettes. If you need help quitting, ask your health care provider.  Do not drink beverages that contain caffeine, such as  coffee, soda, and tea.  Follow diet instructions as told by your health care provider.  Exercise regularly as told by your health care provider.  Do not drink alcohol. General instructions  If you have obstructive sleep apnea, manage your condition as told by your health care provider.  Maintain a healthy weight. Do not use diet pills unless your health care provider approves. Diet pills may make heart problems worse.  Keep all follow-up visits as told by your health care provider. This is important. Contact a health care provider if you:  Notice a change in the rate, rhythm, or strength of your heartbeat.  Are taking an anticoagulant and you notice increased bruising.  Tire more easily when you exercise or exert yourself.  Have a sudden change in weight. Get help right away if you have:   Chest pain, abdominal pain, sweating, or weakness.  Difficulty breathing.  Blood in your vomit, stool (feces), or urine.  Any symptoms of a stroke. "BE FAST" is an easy way to remember the main warning signs of a stroke: ? B - Balance. Signs are dizziness, sudden trouble walking, or loss of balance. ? E - Eyes. Signs are trouble seeing or a sudden change in vision. ? F - Face. Signs are sudden weakness or numbness of the face, or the face or eyelid drooping on one side. ? A - Arms. Signs are weakness or numbness in an arm. This happens suddenly and usually on one side of the body. ? S - Speech. Signs are sudden trouble speaking, slurred speech, or trouble understanding what people say. ? T - Time. Time to call emergency services. Write down what time symptoms started.  Other signs of a stroke, such as: ? A sudden, severe headache with no known cause. ? Nausea or vomiting. ? Seizure.  These symptoms may represent a serious problem that is an emergency. Do not wait to see if the symptoms will go away. Get medical help right away. Call your local emergency services (911 in the [place] Do not  drive yourself to the hospital. Summary  Atrial fibrillation is a type of irregular or rapid heartbeat (arrhythmia).  Symptoms include a feeling that your heart is beating fast or irregularly. In some cases, you may not have symptoms.  The condition is treated with medicines to slow down the heart rate or bring the heart's rhythm back to normal. You may also need blood-thinning medicines to prevent blood clots.  Get help right away if you have symptoms or signs of a stroke. This information is not intended to replace advice given to you by your health care provider. Make sure you discuss any questions you have with your health care provider. Document Released: 01/18/2005 Document Revised: 03/10/2017 Document Reviewed: 03/11/2017 Elsevier Patient Education  2020 [employer].

## 2019-01-02 NOTE — Progress Notes (Signed)
Electrophysiology Office Note   Date:  01/02/2019   ID:  Sonya Lopez, DOB 23-Feb-1955, MRN [id number]  PCP:  Steele Sizer, MD  Cardiologist:  Rockey Situ Primary Electrophysiologist:  Esmee Fallaw Meredith Leeds, MD    Chief Complaint: AF   History of Present Illness: Sonya Lopez is a 63 y.o. female who is being seen today for the evaluation of AF at the request of Gollan, Kathlene November, MD. Presenting today for electrophysiology evaluation.  She has a history of diabetes, GERD, and atrial flutter.  She also has a history of atrial fibrillation as noted by her wearable monitor.  Also has sleep apnea with CPAP.  Noticed in 2013 when she presented to the hospital.  She unfortunately continues to have episodes of atrial fibrillation.  She did go to the emergency room April 2020 with atrial fibrillation.  At that point, she stood up out of bed and had an episode of syncope.  She was taking both propranolol and flecainide, but had not yet taken them that morning.  EMS was called and she was found to be orthostatic and hypotensive.  She received IV fluids and had a cardioversion.  She was switched to amiodarone after this.  Since that time, she is continued to have episodes of atrial fibrillation as documented by her wearable device.  Today, she denies symptoms of palpitations, chest pain, shortness of breath, orthopnea, PND, lower extremity edema, claudication, dizziness, presyncope, syncope, bleeding, or neurologic sequela. The patient is tolerating medications without difficulties.    Past Medical History:  Diagnosis Date  . Allergy   . Anxiety    situational anxiety  . Arrhythmia    A-fib/A-flutter  . Atrial flutter ([place])   . 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  MammoSite  . Cancer [facility]) 2017   Left- DCIS  . Depression   . Diet-controlled type 2 diabetes mellitus ([place])   . Difficult intubation 2009   Lucien had to use  'boogee"  . Female cystocele 01/05/2011  . Fibromyalgia   . GERD (gastroesophageal reflux disease)   . Headache(784.0)   . Kidney stones   . Obesity   . Personal history of radiation therapy 2017   LEFT lumpectomy  . PONV (postoperative nausea and vomiting)    h/o difficult intubation and anes had to use "boogee"  . Vitreous detachment of right eye    sees floaters   Past Surgical History:  Procedure Laterality Date  . ABDOMINAL HYSTERECTOMY  2013  . ANTERIOR AND POSTERIOR REPAIR  11/30/2011   Procedure: ANTERIOR (CYSTOCELE) AND POSTERIOR REPAIR (RECTOCELE);  Surgeon: Reece Packer, MD;  Location: [facility];  Service: Urology;  Laterality: N/A;  with cysto  . BLADDER SUSPENSION  11/30/2011   Procedure: [facility] PROCEDURE;  Surgeon: Reece Packer, MD;  Location: [place] [facility];  Service: Urology;  Laterality: N/A;  graft 10x6  . BREAST BIOPSY Right 2007   negative core  . BREAST BIOPSY Left 09/16/2015   DCIS  . BREAST LUMPECTOMY Left 09/30/2015   DCIS clear margins and rad tx/HIGH GRADE DUCTAL CARCINOMA IN SITU, COMEDO TYPE  . BREAST LUMPECTOMY WITH NEEDLE LOCALIZATION Left 09/30/2015   DCIS excision, MammoSite radiation;  Surgeon: Robert Bellow, MD;  Location: [facility];  Service: General;  Laterality: Left;  . CESAREAN SECTION     x 1  . CHOLECYSTECTOMY  2005  . COLONOSCOPY  2009  . CYSTOSCOPY  11/30/2011   Procedure: CYSTOSCOPY;  Surgeon: Reece Packer, MD;  Location: [facility];  Service: Urology;;  . Brigitte Pulse AND CURETTAGE OF UTERUS  05/05/2007  . GALLBLADDER SURGERY  2004  . TONSILLECTOMY  1961  . VAGINAL HYSTERECTOMY  11/30/2011   Procedure: HYSTERECTOMY VAGINAL;  Surgeon: Emily Filbert, MD;  Location: [place] [facility];  Service: Gynecology;  Laterality: N/A;     Current Outpatient Medications  Medication Sig Dispense Refill  . amiodarone (PACERONE) 200 MG tablet Take 1 tablet (200 mg total) by mouth as directed. Once daily and may take second  pill if needed for breakthrough afib 180 tablet 3  . apixaban (ELIQUIS) 5 MG TABS tablet Take 1 tablet (5 mg total) by mouth 2 (two) times daily. 180 tablet 3  . DULoxetine (CYMBALTA) 20 MG capsule Take 2 capsules (40 mg total) by mouth daily. 180 capsule 1  . FINACEA 15 % FOAM     . Fremanezumab-vfrm (AJOVY) 225 MG/1.5ML SOSY Inject 225 mg into the skin every 30 (thirty) days. 6 Syringe 0  . gabapentin (NEURONTIN) 100 MG capsule TAKE 1 CAPSULE BY MOUTH TWICE DAILY 180 capsule 1  . magnesium oxide (MAG-OX) 400 MG tablet Take 400 mg by mouth every morning.     . metoprolol succinate (TOPROL-XL) 25 MG 24 hr tablet Take 1 tablet (25 mg total) by mouth daily. 90 tablet 2  . naratriptan (AMERGE) 2.5 MG tablet TAKE 1 TABLET BY MOUTH AT ONSET OF HEADACHE. MAY REPEAT AFTER 4 HOURSIF RETURNS OR DOES NOT RESOLVE MAX OF 2 TABS PER DAY. 9 tablet 5  . omeprazole (PRILOSEC) 20 MG capsule TAKE 1 CAPSULE BY MOUTH EVERY DAY 90 capsule 1  . Probiotic Product (PROBIOTIC PO) Take 1 capsule by mouth daily.     . propranolol (INDERAL) 10 MG tablet TAKE ONE TABLET EVERY DAY WHEN TAKING EXTRA FLECAINIDE FOR BREATHROUGH A-FIB 30 tablet 1   No current facility-administered medications for this visit.     Allergies:   Adhesive [tape], Levaquin  [levofloxacin], Macrodantin [nitrofurantoin], Metronidazole, Nitrofurantoin macrocrystal, Sulfamethoxazole-trimethoprim, Cephalexin, Clindamycin hcl, Doxycycline, and Erythromycin   Social History:  The patient  reports that she has never smoked. She has never used smokeless tobacco. She reports previous alcohol use. She reports that she does not use drugs.   Family History:  [name] family history includes Asthma in her daughter, daughter, and daughter; Colitis in her daughter; Diabetes in her brother and mother; Fibromyalgia in her daughter; Hearing loss in her mother; Heart disease in her father and paternal grandfather; Hypertension in her brother and mother; Interstitial  cystitis in her daughter; Migraines in her daughter; Thyroid cancer (age of onset: 62) in her daughter.    ROS:  Please see the history of present illness.   Otherwise, review of systems is positive for none.   All other systems are reviewed and negative.    PHYSICAL EXAM: VS:  BP (!) 150/76   Pulse (!) 51   Ht 5\' 6"  (1.676 m)   Wt 212 lb (96.2 kg)   SpO2 99%   BMI 34.22 kg/m  , BMI Body mass index is 34.22 kg/m. GEN: Well nourished, well developed, in no acute distress  HEENT: normal  Neck: no JVD, carotid bruits, or masses Cardiac: RRR; no murmurs, rubs, or gallops,no edema  Respiratory:  clear to auscultation bilaterally, normal work of breathing GI: soft, nontender, nondistended, + BS MS: no deformity or atrophy  Skin: warm and dry Neuro:  Strength and sensation  are intact Psych: euthymic mood, full affect  EKG:  EKG is ordered today. Personal review of the ekg ordered shows sinus rhythm rate 51  Recent Labs: 05/06/2018: Magnesium 2.1 09/01/2018: ALT 11; BUN 21; Creat 1.00; Hemoglobin 13.1; Platelets 317; Potassium 4.7; Sodium 142; TSH 2.73    Lipid Panel     Component Value Date/Time   CHOL 186 03/02/2018 1000   CHOL 209 (H) 06/04/2015 0753   CHOL 164 04/17/2011 0507   TRIG 87 03/02/2018 1000   TRIG 83 04/17/2011 0507   HDL 60 03/02/2018 1000   HDL 58 06/04/2015 0753   HDL 43 04/17/2011 0507   CHOLHDL 3.1 03/02/2018 1000   VLDL 15 01/05/2016 0828   VLDL 17 04/17/2011 0507   LDLCALC 108 (H) 03/02/2018 1000   LDLCALC 104 (H) 04/17/2011 0507     Wt Readings from Last 3 Encounters:  01/02/19 212 lb (96.2 kg)  12/12/18 215 lb (97.5 kg)  11/28/18 215 lb 6.4 oz (97.7 kg)      Other studies Reviewed: Additional studies/ records that were reviewed today include: ETT 09/29/17 personally reviewed  Review of the above records today demonstrates:  Normal exercise treadmill study Good exercise tolerance Target heart rate achieved Appropriate recovery after 3  minutes   ASSESSMENT AND PLAN:  1.  Paroxysmal atrial fibrillation/atrial flutter: Currently has symptomatic bradycardia on low-dose beta-blockers and amiodarone.  Continue to have breakthrough atrial fibrillation.  Currently on Eliquis.  Ablation was discussed.  Risks include bleeding, tamponade, heart block, stroke, damage surrounding organs.  She understands these risks and is agreed to the procedure.  2.  Hyperlipidemia: Weight loss encouraged.  Plan per primary cardiology.  3.  Obstructive sleep apnea: CPAP encouraged  Current medicines are reviewed at length with the patient today.   The patient does not have concerns regarding her medicines.  The following changes were made today:  none  Labs/ tests ordered today include:  Orders Placed This Encounter  Procedures  . EKG 12-Lead   Case discussed with referring cardiologist  Disposition:   FU with Joselyn Edling 3 months  Signed, Dyllan Kats Meredith Leeds, MD  01/02/2019 10:15 AM     [facility] [address] [place] [address] [phone] (office) [phone] (fax)

## 2019-01-04 DIAGNOSIS — H02831 Dermatochalasis of right upper eyelid: Secondary | ICD-10-CM | POA: Diagnosis not present

## 2019-01-04 DIAGNOSIS — H02834 Dermatochalasis of left upper eyelid: Secondary | ICD-10-CM | POA: Diagnosis not present

## 2019-02-05 ENCOUNTER — Other Ambulatory Visit: Payer: Self-pay | Admitting: Cardiovascular Disease

## 2019-02-05 ENCOUNTER — Other Ambulatory Visit: Payer: Self-pay | Admitting: Family Medicine

## 2019-02-05 DIAGNOSIS — M797 Fibromyalgia: Secondary | ICD-10-CM

## 2019-02-12 ENCOUNTER — Telehealth: Payer: Self-pay | Admitting: Cardiology

## 2019-02-12 DIAGNOSIS — Z01812 Encounter for preprocedural laboratory examination: Secondary | ICD-10-CM

## 2019-02-12 DIAGNOSIS — I480 Paroxysmal atrial fibrillation: Secondary | ICD-10-CM

## 2019-02-12 NOTE — Telephone Encounter (Signed)
Verified pt still wanting to proceed w/ AFib ablation later this month.  Pt voices yes. Aware I will schedule and call her this week to go over instructions. Patient verbalized understanding and agreeable to plan.

## 2019-02-12 NOTE — Telephone Encounter (Signed)
Patient is returning Sherri's call.  °

## 2019-02-15 NOTE — Telephone Encounter (Signed)
Patient following up, states she is returning a call from [place].

## 2019-02-15 NOTE — Telephone Encounter (Signed)
Afib ablation scheduled for 1/28. Instructions reviewed w/ pt. Pt will have pre procedure labs on 1/19 Covid screening on 1/25. Aware office will call to arrange CT and post procedure follow up. Patient verbalized understanding and agreeable to plan.

## 2019-02-16 ENCOUNTER — Telehealth (INDEPENDENT_AMBULATORY_CARE_PROVIDER_SITE_OTHER): Payer: Medicare Other | Admitting: Cardiology

## 2019-02-16 ENCOUNTER — Other Ambulatory Visit: Payer: Self-pay

## 2019-02-16 VITALS — BP 136/53 | HR 62

## 2019-02-16 DIAGNOSIS — I480 Paroxysmal atrial fibrillation: Secondary | ICD-10-CM | POA: Diagnosis not present

## 2019-02-16 NOTE — Progress Notes (Signed)
Electrophysiology TeleHealth Note   Due to national recommendations of social distancing due to COVID 19, an audio/video telehealth visit is felt to be most appropriate for this patient at this time.  See Epic message for the patient's consent to telehealth for [facility].   Date:  02/16/2019   ID:  Sonya Lopez, DOB [birth year], MRN [id number]  Location: patient's home  Provider location: [address] [place]  Evaluation Performed: Follow-up visit  PCP:  Steele Sizer, MD  Cardiologist:  Rockey Situ Electrophysiologist:  Dr Curt Bears  Chief Complaint:  AF  History of Present Illness:    Sonya Lopez is a 64 y.o. female who presents via audio/video conferencing for a telehealth visit today.  Since last being seen in our clinic, the patient reports doing very well.  Today, she denies symptoms of palpitations, chest pain, shortness of breath,  lower extremity edema, dizziness, presyncope, or syncope.  The patient is otherwise without complaint today.  The patient denies symptoms of fevers, chills, cough, or new SOB worrisome for COVID 19.  She has a history significant for diabetes, GERD, atrial flutter, and atrial fibrillation.  She is planned for atrial fibrillation ablation 03/01/2019.  She is currently on both propranolol and flecainide.  Today, denies symptoms of palpitations, chest pain, shortness of breath, orthopnea, PND, lower extremity edema, claudication, dizziness, presyncope, syncope, bleeding, or neurologic sequela. The patient is tolerating medications without difficulties.    Past Medical History:  Diagnosis Date  . Allergy   . Anxiety    situational anxiety  . Arrhythmia    A-fib/A-flutter  . Atrial flutter ([place])   . 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  MammoSite  . Cancer [facility]) 2017   Left- DCIS  . Depression   . Diet-controlled type 2 diabetes mellitus ([place])   .  Difficult intubation 2009   [place] had to use  'boogee"  . Female cystocele 01/05/2011  . Fibromyalgia   . GERD (gastroesophageal reflux disease)   . Headache(784.0)   . Kidney stones   . Obesity   . Personal history of radiation therapy 2017   LEFT lumpectomy  . PONV (postoperative nausea and vomiting)    h/o difficult intubation and anes had to use "boogee"  . Vitreous detachment of right eye    sees floaters    Past Surgical History:  Procedure Laterality Date  . ABDOMINAL HYSTERECTOMY  2013  . ANTERIOR AND POSTERIOR REPAIR  11/30/2011   Procedure: ANTERIOR (CYSTOCELE) AND POSTERIOR REPAIR (RECTOCELE);  Surgeon: Reece Packer, MD;  Location: [facility];  Service: Urology;  Laterality: N/A;  with cysto  . BLADDER SUSPENSION  11/30/2011   Procedure: [facility] PROCEDURE;  Surgeon: Reece Packer, MD;  Location: [facility];  Service: Urology;  Laterality: N/A;  graft 10x6  . BREAST BIOPSY Right 2007   negative core  . BREAST BIOPSY Left 09/16/2015   DCIS  . BREAST LUMPECTOMY Left 09/30/2015   DCIS clear margins and rad tx/HIGH GRADE DUCTAL CARCINOMA IN SITU, COMEDO TYPE  . BREAST LUMPECTOMY WITH NEEDLE LOCALIZATION Left 09/30/2015   DCIS excision, MammoSite radiation;  Surgeon: Robert Bellow, MD;  Location: [facility];  Service: General;  Laterality: Left;  . CESAREAN SECTION     x 1  . CHOLECYSTECTOMY  2005  . COLONOSCOPY  2009  . CYSTOSCOPY  11/30/2011   Procedure: CYSTOSCOPY;  Surgeon: Elayne Snare  MacDiarmid, MD;  Location: [facility];  Service: Urology;;  . DILATION AND CURETTAGE OF UTERUS  05/05/2007  . GALLBLADDER SURGERY  2004  . TONSILLECTOMY  1961  . VAGINAL HYSTERECTOMY  11/30/2011   Procedure: HYSTERECTOMY VAGINAL;  Surgeon: Emily Filbert, MD;  Location: [facility];  Service: Gynecology;  Laterality: N/A;    Current Outpatient Medications  Medication Sig Dispense Refill  . amiodarone (PACERONE) 200 MG tablet Take 1 tablet (200 mg total) by mouth as  directed. Once daily and may take second pill if needed for breakthrough afib 180 tablet 3  . apixaban (ELIQUIS) 5 MG TABS tablet Take 1 tablet (5 mg total) by mouth 2 (two) times daily. 180 tablet 3  . DULoxetine (CYMBALTA) 20 MG capsule Take 2 capsules (40 mg total) by mouth daily. 180 capsule 1  . FINACEA 15 % FOAM     . Fremanezumab-vfrm (AJOVY) 225 MG/1.5ML SOSY Inject 225 mg into the skin every 30 (thirty) days. 6 Syringe 0  . gabapentin (NEURONTIN) 100 MG capsule TAKE 1 CAPSULE BY MOUTH TWICE DAILY 180 capsule 0  . magnesium oxide (MAG-OX) 400 MG tablet Take 400 mg by mouth every morning.     . metoprolol succinate (TOPROL-XL) 25 MG 24 hr tablet Take 1 tablet (25 mg total) by mouth daily. 90 tablet 2  . naratriptan (AMERGE) 2.5 MG tablet TAKE 1 TABLET BY MOUTH AT ONSET OF HEADACHE. MAY REPEAT AFTER 4 HOURSIF RETURNS OR DOES NOT RESOLVE MAX OF 2 TABS PER DAY. 9 tablet 5  . omeprazole (PRILOSEC) 20 MG capsule TAKE 1 CAPSULE BY MOUTH EVERY DAY 90 capsule 1  . Probiotic Product (PROBIOTIC PO) Take 1 capsule by mouth daily.     . propranolol (INDERAL) 10 MG tablet TAKE ONE TABLET EVERY DAY WHEN TAKING EXTRA FLECAINIDE FOR BREAKTHROUGH A-FIB 30 tablet 1   No current facility-administered medications for this visit.    Allergies:   Adhesive [tape], Levaquin  [levofloxacin], Macrodantin [nitrofurantoin], Metronidazole, Nitrofurantoin macrocrystal, Sulfamethoxazole-trimethoprim, Cephalexin, Clindamycin hcl, Doxycycline, and Erythromycin   Social History:  The patient  reports that she has never smoked. She has never used smokeless tobacco. She reports previous alcohol use. She reports that she does not use drugs.   Family History:  [name]  family history includes Asthma in her daughter, daughter, and daughter; Colitis in her daughter; Diabetes in her brother and mother; Fibromyalgia in her daughter; Hearing loss in her mother; Heart disease in her father and paternal grandfather;  Hypertension in her brother and mother; Interstitial cystitis in her daughter; Migraines in her daughter; Thyroid cancer (age of onset: 92) in her daughter.   ROS:  Please see the history of present illness.   All other systems are personally reviewed and negative.    Exam:    Vital Signs:  BP (!) 136/53   Pulse 62   no acute distress, no shortness of breath.  Labs/Other Tests and Data Reviewed:    Recent Labs: 05/06/2018: Magnesium 2.1 09/01/2018: ALT 11; BUN 21; Creat 1.00; Hemoglobin 13.1; Platelets 317; Potassium 4.7; Sodium 142; TSH 2.73   Wt Readings from Last 3 Encounters:  01/02/19 212 lb (96.2 kg)  12/12/18 215 lb (97.5 kg)  11/28/18 215 lb 6.4 oz (97.7 kg)     Other studies personally reviewed: Additional studies/ records that were reviewed today include: TTE 01/02/2019 personally reviewed Review of the above records today demonstrates: Sinus rhythm, rate 51   ASSESSMENT & PLAN:    1.  Paroxysmal  atrial fibrillation/flutter: Currently on amiodarone and Eliquis.  CHA2DS2-VASc of 3.  We Adreona Brand plan for ablation.  Risks and benefits were discussed and include bleeding, tamponade, heart block, stroke, damage surrounding organs.  She understand these risks and is agreed to the procedure.  2.  Obstructive sleep apnea: CPAP compliance encouraged   COVID 19 screen The patient denies symptoms of COVID 19 at this time.  The importance of social distancing was discussed today.  Follow-up:  3 months  Current medicines are reviewed at length with the patient today.   The patient does not have concerns regarding her medicines.  The following changes were made today:  none  Labs/ tests ordered today include:  No orders of the defined types were placed in this encounter.    Patient Risk:  after full review of this patients clinical status, I feel that they are at moderate risk at this time.  Today, I have spent 9 minutes with the patient with telehealth technology discussing AF  .    Signed, Herberta Pickron Meredith Leeds, MD  02/16/2019 3:24 PM     [place] [address] [phone] (office) [phone] (fax)

## 2019-02-20 ENCOUNTER — Other Ambulatory Visit
Admission: RE | Admit: 2019-02-20 | Discharge: 2019-02-20 | Disposition: A | Discharge status: Home / Self Care | Payer: Medicare Other | Source: Ambulatory Visit | Attending: Cardiology | Admitting: Cardiology

## 2019-02-20 DIAGNOSIS — I480 Paroxysmal atrial fibrillation: Secondary | ICD-10-CM | POA: Insufficient documentation

## 2019-02-20 DIAGNOSIS — Z01812 Encounter for preprocedural laboratory examination: Secondary | ICD-10-CM | POA: Insufficient documentation

## 2019-02-20 LAB — CBC
HCT: 41.4 % (ref 36.0–46.0)
Hemoglobin: 13.2 g/dL (ref 12.0–15.0)
MCH: 30.7 pg (ref 26.0–34.0)
MCHC: 31.9 g/dL (ref 30.0–36.0)
MCV: 96.3 fL (ref 80.0–100.0)
Platelets: 287 10*3/uL (ref 150–400)
RBC: 4.3 MIL/uL (ref 3.87–5.11)
RDW: 14.6 % (ref 11.5–15.5)
WBC: 6.4 10*3/uL (ref 4.0–10.5)
nRBC: 0 % (ref 0.0–0.2)

## 2019-02-20 LAB — BASIC METABOLIC PANEL
Anion gap: 9 (ref 5–15)
BUN: 22 mg/dL (ref 8–23)
CO2: 28 mmol/L (ref 22–32)
Calcium: 8.9 mg/dL (ref 8.9–10.3)
Chloride: 104 mmol/L (ref 98–111)
Creatinine, Ser: 1.08 mg/dL — ABNORMAL HIGH (ref 0.44–1.00)
GFR calc Af Amer: >60 mL/min (ref >60)
GFR calc non Af Amer: 54 mL/min — ABNORMAL LOW (ref >60)
Glucose, Bld: 88 mg/dL (ref 70–99)
Potassium: 4.7 mmol/L (ref 3.5–5.1)
Sodium: 141 mmol/L (ref 135–145)

## 2019-02-22 ENCOUNTER — Encounter (HOSPITAL_COMMUNITY): Payer: Self-pay

## 2019-02-22 ENCOUNTER — Telehealth (HOSPITAL_COMMUNITY): Payer: Self-pay | Admitting: Emergency Medicine

## 2019-02-22 ENCOUNTER — Other Ambulatory Visit: Payer: Self-pay | Admitting: Family Medicine

## 2019-02-22 DIAGNOSIS — G43009 Migraine without aura, not intractable, without status migrainosus: Secondary | ICD-10-CM

## 2019-02-22 NOTE — Telephone Encounter (Signed)
Reaching out to patient to offer assistance regarding upcoming cardiac imaging study; pt verbalizes understanding of appt date/time, parking situation and where to check in, pre-test NPO status and medications ordered, and verified current allergies; name and call back number provided for further questions should they arise Marchia Bond RN Navigator Cardiac [facility] [facility] [phone] office [phone] cell   Pt states she takes her toprol xl nightly and her HRs are usually 50-60bpm. I recommended she continue her usual regimen for the CTA tomorrow as our goal is to have HR <65bpm. There is no need for her to take extra dose 2 hr prior

## 2019-02-23 ENCOUNTER — Other Ambulatory Visit: Payer: Self-pay

## 2019-02-23 ENCOUNTER — Ambulatory Visit (HOSPITAL_COMMUNITY)
Admission: RE | Admit: 2019-02-23 | Discharge: 2019-02-23 | Disposition: A | Discharge status: Home / Self Care | Payer: Medicare Other | Source: Ambulatory Visit | Attending: Cardiology | Admitting: Cardiology

## 2019-02-23 DIAGNOSIS — I480 Paroxysmal atrial fibrillation: Secondary | ICD-10-CM | POA: Diagnosis not present

## 2019-02-23 MED ORDER — IOHEXOL 350 MG/ML SOLN
80.0000 mL | Freq: Once | INTRAVENOUS | Status: AC | PRN
  Administered 2019-02-23: 80 mL via INTRAVENOUS

## 2019-02-26 ENCOUNTER — Other Ambulatory Visit
Admission: RE | Admit: 2019-02-26 | Discharge: 2019-02-26 | Disposition: A | Discharge status: Home / Self Care | Payer: Medicare Other | Source: Ambulatory Visit | Attending: Cardiology | Admitting: Cardiology

## 2019-02-26 ENCOUNTER — Other Ambulatory Visit: Payer: Self-pay

## 2019-02-26 DIAGNOSIS — Z01812 Encounter for preprocedural laboratory examination: Secondary | ICD-10-CM | POA: Insufficient documentation

## 2019-02-26 DIAGNOSIS — Z20822 Contact with and (suspected) exposure to covid-19: Secondary | ICD-10-CM | POA: Insufficient documentation

## 2019-02-27 LAB — SARS CORONAVIRUS 2 (TAT 6-24 HRS): SARS Coronavirus 2: NEGATIVE

## 2019-02-28 NOTE — Anesthesia Preprocedure Evaluation (Addendum)
Anesthesia Evaluation  Patient identified by MRN, date of birth, ID band Patient awake    Reviewed: Allergy & Precautions, H&P , NPO status , Patient's Chart, lab work & pertinent test results, reviewed documented beta blocker date and time   History of Anesthesia Complications (+) PONV and DIFFICULT AIRWAY  Airway Mallampati: III  TM Distance: >3 FB Neck ROM: Full    Dental no notable dental hx. (+) Teeth Intact, Dental Advisory Given   Pulmonary neg pulmonary ROS,    Pulmonary exam normal breath sounds clear to auscultation       Cardiovascular Exercise Tolerance: Good + dysrhythmias Atrial Fibrillation  Rhythm:Regular Rate:Normal     Neuro/Psych  Headaches, Anxiety Depression negative psych ROS   GI/Hepatic negative GI ROS, Neg liver ROS, GERD  ,  Endo/Other  diabetes  Renal/GU negative Renal ROS  negative genitourinary   Musculoskeletal  (+) Fibromyalgia -  Abdominal   Peds  Hematology negative hematology ROS (+)   Anesthesia Other Findings   Reproductive/Obstetrics negative OB ROS                            Anesthesia Physical Anesthesia Plan  ASA: III  Anesthesia Plan: General   Post-op Pain Management:    Induction: Intravenous  PONV Risk Score and Plan: 4 or greater and Ondansetron, Dexamethasone, Midazolam and Scopolamine patch - Pre-op  Airway Management Planned: Oral ETT and Video Laryngoscope Planned  Additional Equipment:   Intra-op Plan:   Post-operative Plan: Extubation in OR  Informed Consent: I have reviewed the patients History and Physical, chart, labs and discussed the procedure including the risks, benefits and alternatives for the proposed anesthesia with the patient or authorized representative who has indicated his/her understanding and acceptance.     Dental advisory given  Plan Discussed with: CRNA  Anesthesia Plan Comments:         Anesthesia Quick Evaluation

## 2019-03-01 ENCOUNTER — Encounter (HOSPITAL_COMMUNITY)
Admission: RE | Disposition: A | Discharge status: Home / Self Care | Payer: Self-pay | Source: Home / Self Care | Attending: Cardiology

## 2019-03-01 ENCOUNTER — Ambulatory Visit (HOSPITAL_COMMUNITY): Payer: Medicare Other | Admitting: Anesthesiology

## 2019-03-01 ENCOUNTER — Other Ambulatory Visit: Payer: Self-pay

## 2019-03-01 ENCOUNTER — Ambulatory Visit (HOSPITAL_COMMUNITY)
Admission: RE | Admit: 2019-03-01 | Discharge: 2019-03-01 | Disposition: A | Discharge status: Home / Self Care | Payer: Medicare Other | Attending: Cardiology | Admitting: Cardiology

## 2019-03-01 DIAGNOSIS — Z888 Allergy status to other drugs, medicaments and biological substances status: Secondary | ICD-10-CM | POA: Diagnosis not present

## 2019-03-01 DIAGNOSIS — Z833 Family history of diabetes mellitus: Secondary | ICD-10-CM | POA: Diagnosis not present

## 2019-03-01 DIAGNOSIS — Z881 Allergy status to other antibiotic agents status: Secondary | ICD-10-CM | POA: Diagnosis not present

## 2019-03-01 DIAGNOSIS — E119 Type 2 diabetes mellitus without complications: Secondary | ICD-10-CM | POA: Diagnosis not present

## 2019-03-01 DIAGNOSIS — Z8249 Family history of ischemic heart disease and other diseases of the circulatory system: Secondary | ICD-10-CM | POA: Insufficient documentation

## 2019-03-01 DIAGNOSIS — I480 Paroxysmal atrial fibrillation: Secondary | ICD-10-CM | POA: Insufficient documentation

## 2019-03-01 DIAGNOSIS — G4733 Obstructive sleep apnea (adult) (pediatric): Secondary | ICD-10-CM | POA: Diagnosis not present

## 2019-03-01 DIAGNOSIS — Z79899 Other long term (current) drug therapy: Secondary | ICD-10-CM | POA: Diagnosis not present

## 2019-03-01 DIAGNOSIS — M797 Fibromyalgia: Secondary | ICD-10-CM | POA: Diagnosis not present

## 2019-03-01 DIAGNOSIS — Z882 Allergy status to sulfonamides status: Secondary | ICD-10-CM | POA: Insufficient documentation

## 2019-03-01 DIAGNOSIS — E669 Obesity, unspecified: Secondary | ICD-10-CM | POA: Diagnosis not present

## 2019-03-01 DIAGNOSIS — G43711 Chronic migraine without aura, intractable, with status migrainosus: Secondary | ICD-10-CM | POA: Diagnosis not present

## 2019-03-01 DIAGNOSIS — Z6834 Body mass index (BMI) 34.0-34.9, adult: Secondary | ICD-10-CM | POA: Insufficient documentation

## 2019-03-01 DIAGNOSIS — I4892 Unspecified atrial flutter: Secondary | ICD-10-CM | POA: Insufficient documentation

## 2019-03-01 DIAGNOSIS — Z7901 Long term (current) use of anticoagulants: Secondary | ICD-10-CM | POA: Insufficient documentation

## 2019-03-01 HISTORY — PX: ATRIAL FIBRILLATION ABLATION: EP1191

## 2019-03-01 LAB — POCT ACTIVATED CLOTTING TIME
Activated Clotting Time: 290 seconds
Activated Clotting Time: 340 seconds

## 2019-03-01 SURGERY — ATRIAL FIBRILLATION ABLATION
Anesthesia: General

## 2019-03-01 MED ORDER — MIDAZOLAM HCL 5 MG/5ML IJ SOLN
INTRAMUSCULAR | Status: DC | PRN
  Administered 2019-03-01: 2 mg via INTRAVENOUS

## 2019-03-01 MED ORDER — BUPIVACAINE HCL (PF) 0.25 % IJ SOLN
INTRAMUSCULAR | Status: AC
  Filled 2019-03-01: qty 30

## 2019-03-01 MED ORDER — PROPOFOL 10 MG/ML IV BOLUS
INTRAVENOUS | Status: DC | PRN
  Administered 2019-03-01: 120 mg via INTRAVENOUS

## 2019-03-01 MED ORDER — HEPARIN SODIUM (PORCINE) 1000 UNIT/ML IJ SOLN
INTRAMUSCULAR | Status: DC | PRN
  Administered 2019-03-01: 1000 [IU] via INTRAVENOUS

## 2019-03-01 MED ORDER — SUGAMMADEX SODIUM 200 MG/2ML IV SOLN
INTRAVENOUS | Status: DC | PRN
  Administered 2019-03-01: 200 mg via INTRAVENOUS

## 2019-03-01 MED ORDER — PHENYLEPHRINE HCL-NACL 10-0.9 MG/250ML-% IV SOLN
INTRAVENOUS | Status: DC | PRN
  Administered 2019-03-01: 25 ug/min via INTRAVENOUS

## 2019-03-01 MED ORDER — ACETAMINOPHEN 325 MG PO TABS
650.0000 mg | ORAL_TABLET | ORAL | Status: DC | PRN
  Administered 2019-03-01: 650 mg via ORAL
  Filled 2019-03-01: qty 2

## 2019-03-01 MED ORDER — DOBUTAMINE IN D5W 4-5 MG/ML-% IV SOLN
INTRAVENOUS | Status: DC | PRN
  Administered 2019-03-01: 20 ug/kg/min via INTRAVENOUS

## 2019-03-01 MED ORDER — ACETAMINOPHEN 500 MG PO TABS
ORAL_TABLET | ORAL | Status: AC
  Filled 2019-03-01: qty 2

## 2019-03-01 MED ORDER — ACETAMINOPHEN 500 MG PO TABS
1000.0000 mg | ORAL_TABLET | Freq: Once | ORAL | Status: AC
  Administered 2019-03-01: 07:00:00 1000 mg via ORAL
  Filled 2019-03-01: qty 2

## 2019-03-01 MED ORDER — FENTANYL CITRATE (PF) 250 MCG/5ML IJ SOLN
INTRAMUSCULAR | Status: DC | PRN
  Administered 2019-03-01 (×2): 50 ug via INTRAVENOUS

## 2019-03-01 MED ORDER — HEPARIN SODIUM (PORCINE) 1000 UNIT/ML IJ SOLN
INTRAMUSCULAR | Status: DC | PRN
  Administered 2019-03-01: 3000 [IU] via INTRAVENOUS
  Administered 2019-03-01: 14000 [IU] via INTRAVENOUS

## 2019-03-01 MED ORDER — ONDANSETRON HCL 4 MG/2ML IJ SOLN
INTRAMUSCULAR | Status: DC | PRN
  Administered 2019-03-01: 4 mg via INTRAVENOUS

## 2019-03-01 MED ORDER — SODIUM CHLORIDE 0.9 % IV SOLN: INTRAVENOUS | Status: DC

## 2019-03-01 MED ORDER — GLYCOPYRROLATE 0.2 MG/ML IJ SOLN
INTRAMUSCULAR | Status: DC | PRN
  Administered 2019-03-01: .2 mg via INTRAVENOUS

## 2019-03-01 MED ORDER — SCOPOLAMINE 1 MG/3DAYS TD PT72
MEDICATED_PATCH | TRANSDERMAL | Status: AC
  Filled 2019-03-01: qty 1

## 2019-03-01 MED ORDER — SODIUM CHLORIDE 0.9% FLUSH: 3.0000 mL | INTRAVENOUS | Status: DC | PRN

## 2019-03-01 MED ORDER — DEXAMETHASONE SODIUM PHOSPHATE 10 MG/ML IJ SOLN
INTRAMUSCULAR | Status: DC | PRN
  Administered 2019-03-01: 10 mg via INTRAVENOUS

## 2019-03-01 MED ORDER — DOBUTAMINE IN D5W 4-5 MG/ML-% IV SOLN
INTRAVENOUS | Status: AC
  Filled 2019-03-01: qty 250

## 2019-03-01 MED ORDER — ACETAMINOPHEN 325 MG PO TABS
ORAL_TABLET | ORAL | Status: AC
  Filled 2019-03-01: qty 2

## 2019-03-01 MED ORDER — HEPARIN SODIUM (PORCINE) 1000 UNIT/ML IJ SOLN
INTRAMUSCULAR | Status: AC
  Filled 2019-03-01: qty 1

## 2019-03-01 MED ORDER — BUPIVACAINE HCL (PF) 0.25 % IJ SOLN
INTRAMUSCULAR | Status: DC | PRN
  Administered 2019-03-01: 30 mL

## 2019-03-01 MED ORDER — HEPARIN (PORCINE) IN NACL 1000-0.9 UT/500ML-% IV SOLN
INTRAVENOUS | Status: DC | PRN
  Administered 2019-03-01 (×5): 500 mL

## 2019-03-01 MED ORDER — ONDANSETRON HCL 4 MG/2ML IJ SOLN
4.0000 mg | Freq: Four times a day (QID) | INTRAMUSCULAR | Status: DC | PRN
  Administered 2019-03-01: 11:00:00 4 mg via INTRAVENOUS

## 2019-03-01 MED ORDER — HEPARIN (PORCINE) IN NACL 1000-0.9 UT/500ML-% IV SOLN
INTRAVENOUS | Status: AC
  Filled 2019-03-01: qty 2500

## 2019-03-01 MED ORDER — ROCURONIUM BROMIDE 10 MG/ML (PF) SYRINGE
PREFILLED_SYRINGE | INTRAVENOUS | Status: DC | PRN
  Administered 2019-03-01: 20 mg via INTRAVENOUS
  Administered 2019-03-01: 50 mg via INTRAVENOUS
  Administered 2019-03-01: 15 mg via INTRAVENOUS

## 2019-03-01 MED ORDER — SCOPOLAMINE 1 MG/3DAYS TD PT72
MEDICATED_PATCH | TRANSDERMAL | Status: DC | PRN
  Administered 2019-03-01: 1 via TRANSDERMAL

## 2019-03-01 MED ORDER — PROTAMINE SULFATE 10 MG/ML IV SOLN
INTRAVENOUS | Status: DC | PRN
  Administered 2019-03-01: 20 mg via INTRAVENOUS
  Administered 2019-03-01: 10 mg via INTRAVENOUS
  Administered 2019-03-01: 20 mg via INTRAVENOUS

## 2019-03-01 MED ORDER — SODIUM CHLORIDE 0.9% FLUSH: 3.0000 mL | Freq: Two times a day (BID) | INTRAVENOUS | Status: DC

## 2019-03-01 MED ORDER — LACTATED RINGERS IV SOLN: INTRAVENOUS | Status: DC | PRN

## 2019-03-01 MED ORDER — LIDOCAINE 2% (20 MG/ML) 5 ML SYRINGE
INTRAMUSCULAR | Status: DC | PRN
  Administered 2019-03-01: 60 mg via INTRAVENOUS

## 2019-03-01 MED ORDER — SODIUM CHLORIDE 0.9 % IV SOLN: 250.0000 mL | INTRAVENOUS | Status: DC | PRN

## 2019-03-01 MED ORDER — ONDANSETRON HCL 4 MG/2ML IJ SOLN
INTRAMUSCULAR | Status: AC
  Filled 2019-03-01: qty 2

## 2019-03-01 SURGICAL SUPPLY — 20 items
BLANKET WARM UNDERBOD FULL ACC (MISCELLANEOUS) ×2
CATH SMTCH THERMOCOOL SF DF (CATHETERS) ×2
CATH SOUNDSTAR ECO 8FR (CATHETERS) ×2
CATH WEBSTER BI DIR CS D-F CRV (CATHETERS) ×2
COVER SWIFTLINK CONNECTOR (BAG) ×2
PACK EP LATEX FREE (CUSTOM PROCEDURE TRAY) ×1
PACK EP LF (CUSTOM PROCEDURE TRAY) ×1
PAD PRO RADIOLUCENT 2001M-C (PAD) ×2
PATCH CARTO3 (PAD) ×2
PENTARAY F 2-6-2MM (CATHETERS) ×2
PERCLOSE PROGLIDE 6F (VASCULAR PRODUCTS) ×10
SHEATH BAYLIS SUREFLEX  M 8.5 (SHEATH) ×1
SHEATH BAYLIS SUREFLEX M 8.5 (SHEATH) ×1
SHEATH BAYLIS TRANSSEPTAL 98CM (NEEDLE) ×2
SHEATH CARTO VIZIGO SM CVD (SHEATH) ×2
SHEATH PINNACLE 7F 10CM (SHEATH) ×2
SHEATH PINNACLE 8F 10CM (SHEATH) ×4
SHEATH PINNACLE 9F 10CM (SHEATH) ×2
SHEATH PROBE COVER 6X72 (BAG) ×4
TUBING SMART ABLATE COOLFLOW (TUBING) ×2

## 2019-03-01 NOTE — H&P (Signed)
Sonya Lopez has presented today for surgery, with the diagnosis of atrial fibrillation.  The various methods of treatment have been discussed with the patient and family. After consideration of risks, benefits and other options for treatment, the patient has consented to  Procedure(s): Catheter ablation as a surgical intervention .  Risks include but not limited to bleeding, tamponade, heart block, stroke, damage to surrounding organs, among others. [name] history has been reviewed, patient examined, no change in status, stable for surgery.  I have reviewed the patient's chart and labs.  Questions were answered to the patient's satisfaction.    Rey Dansby Curt Bears, MD 03/01/2019 7:08 AM

## 2019-03-01 NOTE — Anesthesia Procedure Notes (Signed)
Procedure Name: Intubation Date/Time: 03/01/2019 7:54 AM Performed by: Mariea Clonts, CRNA Pre-anesthesia Checklist: Patient identified, Emergency Drugs available, Suction available and Patient being monitored Patient Re-evaluated:Patient Re-evaluated prior to induction Oxygen Delivery Method: Circle System Utilized Preoxygenation: Pre-oxygenation with 100% oxygen Induction Type: IV induction Ventilation: Mask ventilation without difficulty Laryngoscope Size: Glidescope and 3 Grade View: Grade I Tube type: Oral Tube size: 7.0 mm Number of attempts: 1 Airway Equipment and Method: Stylet,  Oral airway and Video-laryngoscopy Placement Confirmation: ETT inserted through vocal cords under direct vision,  positive ETCO2 and breath sounds checked- equal and bilateral Tube secured with: Tape Dental Injury: Teeth and Oropharynx as per pre-operative assessment

## 2019-03-01 NOTE — Anesthesia Postprocedure Evaluation (Deleted)
Anesthesia Post Note  Patient: Sonya Lopez  Procedure(s) Performed: ATRIAL FIBRILLATION ABLATION (N/A )     Patient location during evaluation: Endoscopy Anesthesia Type: General Level of consciousness: awake and alert Pain management: pain level controlled Vital Signs Assessment: post-procedure vital signs reviewed and stable Respiratory status: spontaneous breathing, nonlabored ventilation and respiratory function stable Cardiovascular status: blood pressure returned to baseline and stable Postop Assessment: no apparent nausea or vomiting Anesthetic complications: no    Last Vitals:  Vitals:   03/01/19 0555  BP: (!) 127/53  Pulse: (!) 58  Resp: 16  Temp: 36.6 C  SpO2: 97%    Last Pain:  Vitals:   03/01/19 0625  TempSrc:   PainSc: 0-No pain                 Kialee Kham,W. EDMOND

## 2019-03-01 NOTE — Discharge Instructions (Signed)
Cardiac Ablation, Care After This sheet gives you information about how to care for yourself after your procedure. Your health care provider may also give you more specific instructions. If you have problems or questions, contact your health care provider. What can I expect after the procedure? After the procedure, it is common to have:  Bruising around your puncture site.  Tenderness around your puncture site.  Skipped heartbeats.  Tiredness (fatigue). Follow these instructions at home: Puncture site care   Follow instructions from your health care provider about how to take care of your puncture site. Make sure you: ? Wash your hands with soap and water before you change your bandage (dressing). If soap and water are not available, use hand sanitizer. ? Change your dressing as told by your health care provider. ? Leave stitches (sutures), skin glue, or adhesive strips in place. These skin closures may need to stay in place for up to 2 weeks. If adhesive strip edges start to loosen and curl up, you may trim the loose edges. Do not remove adhesive strips completely unless your health care provider tells you to do that.  Check your puncture site every day for signs of infection. Check for: ? Redness, swelling, or pain. ? Fluid or blood. If your puncture site starts to bleed, lie down on your back, apply firm pressure to the area, and contact your health care provider. ? Warmth. ? Pus or a bad smell. Driving  Ask your health care provider when it is safe for you to drive again after the procedure.  Do not drive or use heavy machinery while taking prescription pain medicine.  Do not drive for 24 hours if you were given a medicine to help you relax (sedative) during your procedure. Activity  Avoid activities that take a lot of effort for at least 3 days after your procedure.  Do not lift anything that is heavier than 10 lb (4.5 kg), or the limit that you are told, until your health  care provider says that it is safe.  Return to your normal activities as told by your health care provider. Ask your health care provider what activities are safe for you. General instructions  Take over-the-counter and prescription medicines only as told by your health care provider.  Do not use any products that contain nicotine or tobacco, such as cigarettes and e-cigarettes. If you need help quitting, ask your health care provider.  Do not take baths, swim, or use a hot tub until your health care provider approves.  Do not drink alcohol for 24 hours after your procedure.  Keep all follow-up visits as told by your health care provider. This is important. Contact a health care provider if:  You have redness, mild swelling, or pain around your puncture site.  You have fluid or blood coming from your puncture site that stops after applying firm pressure to the area.  Your puncture site feels warm to the touch.  You have pus or a bad smell coming from your puncture site.  You have a fever.  You have chest pain or discomfort that spreads to your neck, jaw, or arm.  You are sweating a lot.  You feel nauseous.  You have a fast or irregular heartbeat.  You have shortness of breath.  You are dizzy or light-headed and feel the need to lie down.  You have pain or numbness in the arm or leg closest to your puncture site. Get help right away if:  Your puncture   site suddenly swells.  Your puncture site is bleeding and the bleeding does not stop after applying firm pressure to the area. These symptoms may represent a serious problem that is an emergency. Do not wait to see if the symptoms will go away. Get medical help right away. Call your local emergency services (911 in the [place] Do not drive yourself to the hospital. Summary  After the procedure, it is normal to have bruising and tenderness at the puncture site in your groin, neck, or forearm.  Check your puncture site every  day for signs of infection.  Get help right away if your puncture site is bleeding and the bleeding does not stop after applying firm pressure to the area. This is a medical emergency. This information is not intended to replace advice given to you by your health care provider. Make sure you discuss any questions you have with your health care provider. Document Revised: 12/31/2016 Document Reviewed: 04/29/2016 Elsevier Patient Education  2020 Elsevier Inc.  

## 2019-03-01 NOTE — Anesthesia Postprocedure Evaluation (Signed)
Anesthesia Post Note  Patient: Sonya Lopez  Procedure(s) Performed: ATRIAL FIBRILLATION ABLATION (N/A )     Patient location during evaluation: Cath Lab Anesthesia Type: General Level of consciousness: awake and alert Pain management: pain level controlled Vital Signs Assessment: post-procedure vital signs reviewed and stable Respiratory status: spontaneous breathing, nonlabored ventilation and respiratory function stable Cardiovascular status: blood pressure returned to baseline and stable Postop Assessment: no apparent nausea or vomiting Anesthetic complications: no    Last Vitals:  Vitals:   03/01/19 1105 03/01/19 1110  BP: (!) 120/52 110/72  Pulse: (!) 58 61  Resp: 10 15  Temp: (!) 36.4 C   SpO2: 98% 97%    Last Pain:  Vitals:   03/01/19 1032  TempSrc: Temporal  PainSc: 0-No pain                 Takeya Marquis,W. EDMOND

## 2019-03-01 NOTE — Transfer of Care (Signed)
Immediate Anesthesia Transfer of Care Note  Patient: Sonya Lopez  Procedure(s) Performed: ATRIAL FIBRILLATION ABLATION (N/A )  Patient Location: Cath Lab  Anesthesia Type:General  Level of Consciousness: awake, alert  and oriented  Airway & Oxygen Therapy: Patient Spontanous Breathing and Patient connected to nasal cannula oxygen  Post-op Assessment: Report given to RN, Post -op Vital signs reviewed and stable and Patient moving all extremities X 4  Post vital signs: Reviewed and stable  Last Vitals:  Vitals Value Taken Time  BP 110/57 03/01/19 1037  Temp 36.4 C 03/01/19 1032  Pulse 58 03/01/19 1043  Resp 16 03/01/19 1043  SpO2 94 % 03/01/19 1043  Vitals shown include unvalidated device data.  Last Pain:  Vitals:   03/01/19 1032  TempSrc: Temporal  PainSc: 0-No pain         Complications: No apparent anesthesia complications

## 2019-03-02 ENCOUNTER — Ambulatory Visit: Payer: Medicare Other | Admitting: Family Medicine

## 2019-03-13 ENCOUNTER — Ambulatory Visit (HOSPITAL_COMMUNITY)
Admission: RE | Admit: 2019-03-13 | Discharge: 2019-03-13 | Disposition: A | Discharge status: Home / Self Care | Payer: Medicare Other | Source: Ambulatory Visit | Attending: Cardiology | Admitting: Cardiology

## 2019-03-13 DIAGNOSIS — I480 Paroxysmal atrial fibrillation: Secondary | ICD-10-CM | POA: Diagnosis not present

## 2019-03-14 ENCOUNTER — Ambulatory Visit (INDEPENDENT_AMBULATORY_CARE_PROVIDER_SITE_OTHER): Payer: Medicare Other | Admitting: Family Medicine

## 2019-03-14 ENCOUNTER — Encounter: Payer: Self-pay | Admitting: Family Medicine

## 2019-03-14 ENCOUNTER — Other Ambulatory Visit: Payer: Self-pay

## 2019-03-14 VITALS — BP 118/78 | HR 63 | Temp 97.8°F | Resp 16 | Ht 66.0 in | Wt 216.7 lb

## 2019-03-14 DIAGNOSIS — Z8639 Personal history of other endocrine, nutritional and metabolic disease: Secondary | ICD-10-CM | POA: Diagnosis not present

## 2019-03-14 DIAGNOSIS — E538 Deficiency of other specified B group vitamins: Secondary | ICD-10-CM

## 2019-03-14 DIAGNOSIS — E559 Vitamin D deficiency, unspecified: Secondary | ICD-10-CM | POA: Diagnosis not present

## 2019-03-14 DIAGNOSIS — R8290 Unspecified abnormal findings in urine: Secondary | ICD-10-CM

## 2019-03-14 DIAGNOSIS — I480 Paroxysmal atrial fibrillation: Secondary | ICD-10-CM | POA: Diagnosis not present

## 2019-03-14 DIAGNOSIS — M797 Fibromyalgia: Secondary | ICD-10-CM | POA: Diagnosis not present

## 2019-03-14 DIAGNOSIS — G4700 Insomnia, unspecified: Secondary | ICD-10-CM | POA: Diagnosis not present

## 2019-03-14 DIAGNOSIS — G43009 Migraine without aura, not intractable, without status migrainosus: Secondary | ICD-10-CM | POA: Diagnosis not present

## 2019-03-14 DIAGNOSIS — G4733 Obstructive sleep apnea (adult) (pediatric): Secondary | ICD-10-CM | POA: Diagnosis not present

## 2019-03-14 DIAGNOSIS — F325 Major depressive disorder, single episode, in full remission: Secondary | ICD-10-CM

## 2019-03-14 MED ORDER — TRAZODONE HCL 50 MG PO TABS: 25.0000 mg | ORAL_TABLET | Freq: Every evening | ORAL | 0 refills | Status: DC | PRN

## 2019-03-14 MED ORDER — GABAPENTIN 100 MG PO CAPS: 100.0000 mg | ORAL_CAPSULE | Freq: Two times a day (BID) | ORAL | 0 refills | Status: DC

## 2019-03-14 MED ORDER — DULOXETINE HCL 20 MG PO CPEP: 40.0000 mg | ORAL_CAPSULE | Freq: Every day | ORAL | 1 refills | Status: DC

## 2019-03-14 NOTE — Progress Notes (Signed)
Name: Sonya Lopez   MRN: [id number]    DOB: 02-May-1955   Date:03/14/2019       Progress Note  Subjective  Chief Complaint  Chief Complaint  Patient presents with  . Follow-up    6 month recheck  . Medication Refill    HPI  OSA: Dr. Jaynee Eagles ordered sleep study 07/2018, showed mild OSA, but had hypoxia of 82% and seen by Dr. Felicie Morn ( pulmonologist) she states since she has been using CPAP machine back in July 2020 she has noticed that nasal congestion has resolved and has stopped taking xyzal or using nasal spray She states that she feels refreshed when she gets a good night of sleep, however she has not been falling asleep since she had cardiac ablation.  FMS: She states that Cymbalta and Gabapentin seems to be helping with symptoms,she still has mental fogginess, and aches all over, pain level has been higher, she states the cold weather and also recently had a cardiac ablation and pain increased after procedure. Pain at this time on her hip and shoulders is at least 6/10   Bilateral hip pain: seen by [facility] and had PT ,pain is stable, she also has bursitis, worse on the left side.   Atrial Flutter/Afib: She had a severe episode on April 4th, 2020, she had palpitation, dizziness and two episodes of syncope and husband called 911, Dr. Rockey Situ saw her on April 8th, 2020 and since that time she has been on Amiodarone and metoprolol, but  symptoms did not improve about  two episodes per week that were lasting  about 2-3 hours therefore she was referred to Dr. Curt Bears and had an ablation done on 02/28/2018, she states she is doing better, very brief episodes since, only lasting seconds. Heart rate on her iwatch over the past wee was 40-144   Migraine Headaches:since started on Ajoby migraines has been controlled , about 3 per month until recently ( had a flare after cardiac ablation) but otherwise doing well. Usually left maxillary and behind left eye and radiates to nuchal  area, sometimes associated with nausea, but no vomiting. She has photophobia   Mild Major Depression:she is still taking Duloxetine,phq 9 is 3 because of lack of sleep, not because of depression,  she denies crying spells, not sleeping well since surgery. We will try Trazodone  History DMII: last abnormal A1C was many years ago, on diet only, we will remove diagnosis of diabetes to history of diabetes and monitor. She denies polyphagia, no polyuria or polydipsia.   GERD: under control with medication, she tries alternating Prilosec and otc medication   History of left breast cancer: she had lumpectomy, could not tolerate estrogen suppressive therapy.mammogramis up to date.she is seeing Dr. Fleet Contras again   Obesity: shestates she has been eating healthier, except during holidays, she has lost 3 lbs since last visit 6 months ago. More vegetables and fruit  Urine odor: she noticed urine has some odor and is darker than usual since ablation, no dysuria   Patient Active Problem List   Diagnosis Date Noted  . Central sleep apnea 07/06/2018  . Paroxysmal atrial fibrillation ([place]) 07/06/2018  . Chronic migraine without aura, with intractable migraine, so stated, with status migrainosus 06/20/2018  . Syncope 05/10/2018  . Pain, elbow joint 02/02/2018  . PAF (paroxysmal atrial fibrillation) ([place]) 09/13/2017  . Trochanteric bursitis of left hip 06/24/2017  . Low back pain 02/22/2017  . Benign breast cyst in female, right 05/25/2016  . History  of left breast cancer 09/16/2015  . Long-term use of high-risk medication 02/25/2015  . Increased urinary frequency 08/01/2014  . Mild major depression ([place]) 07/29/2014  . Gastric reflux 07/29/2014  . Chronic interstitial cystitis 07/29/2014  . Calculus of kidney 07/29/2014  . Allergic rhinitis 07/29/2014  . Acne erythematosa 07/29/2014  . Lumbosacral radiculitis 07/29/2014  . Tinnitus 07/29/2014  . Mixed incontinence, urge and stress  (female) (female) 08/23/2013  . Hyperlipidemia 04/25/2013  . H/O: hysterectomy 11/30/2011  . Atrial flutter ([place]) 05/03/2011  . Diet-controlled type 2 diabetes mellitus ([place]) 04/16/2011  . Bladder cystocele 01/05/2011  . Migraine without aura and without status migrainosus, not intractable 12/29/2010  . Fibromyalgia 12/29/2010  . Obesity 12/29/2010  . Migraine 12/29/2010    Past Surgical History:  Procedure Laterality Date  . ABDOMINAL HYSTERECTOMY  2013  . ANTERIOR AND POSTERIOR REPAIR  11/30/2011   Procedure: ANTERIOR (CYSTOCELE) AND POSTERIOR REPAIR (RECTOCELE);  Surgeon: Reece Packer, MD;  Location: [facility];  Service: Urology;  Laterality: N/A;  with cysto  . ATRIAL FIBRILLATION ABLATION N/A 03/01/2019   Procedure: ATRIAL FIBRILLATION ABLATION;  Surgeon: Constance Haw, MD;  Location: [facility];  Service: Cardiovascular;  Laterality: N/A;  . BLADDER SUSPENSION  11/30/2011   Procedure: [place] PROCEDURE;  Surgeon: Reece Packer, MD;  Location: [facility];  Service: Urology;  Laterality: N/A;  graft 10x6  . BREAST BIOPSY Right 2007   negative core  . BREAST BIOPSY Left 09/16/2015   DCIS  . BREAST LUMPECTOMY Left 09/30/2015   DCIS clear margins and rad tx/HIGH GRADE DUCTAL CARCINOMA IN SITU, COMEDO TYPE  . BREAST LUMPECTOMY WITH NEEDLE LOCALIZATION Left 09/30/2015   DCIS excision, MammoSite radiation;  Surgeon: Robert Bellow, MD;  Location: [facility];  Service: General;  Laterality: Left;  . CATARACT EXTRACTION Left 01/2019  . CESAREAN SECTION     x 1  . CHOLECYSTECTOMY  2005  . COLONOSCOPY  2009  . CYSTOSCOPY  11/30/2011   Procedure: CYSTOSCOPY;  Surgeon: Reece Packer, MD;  Location: [facility];  Service: Urology;;  . Brigitte Pulse AND CURETTAGE OF UTERUS  05/05/2007  . GALLBLADDER SURGERY  2004  . TONSILLECTOMY  1961  . VAGINAL HYSTERECTOMY  11/30/2011   Procedure: HYSTERECTOMY VAGINAL;  Surgeon: Emily Filbert, MD;  Location: [facility];  Service: Gynecology;   Laterality: N/A;    Family History  Problem Relation Age of Onset  . Diabetes Mother   . Hearing loss Mother   . Hypertension Mother   . Heart disease Father   . Heart disease Paternal Grandfather   . Asthma Daughter   . Asthma Daughter   . Migraines Daughter   . Fibromyalgia Daughter   . Interstitial cystitis Daughter   . Asthma Daughter   . Colitis Daughter   . Thyroid cancer Daughter 31  . Diabetes Brother   . Hypertension Brother   . Breast cancer Neg Hx     Social History   Socioeconomic History  . Marital status: Married    Spouse name: Environmental consultant   . Number of children: 3  . Years of education: 16  . Highest education level: Bachelor's degree (e.g., BA, AB, BS)  Occupational History  . Not on file  Tobacco Use  . Smoking status: Never Smoker  . Smokeless tobacco: Never Used  Substance and Sexual Activity  . Alcohol use: Not Currently    Alcohol/week: 0.0 standard drinks    Comment: Due to AF  . Drug  use: No  . Sexual activity: Yes    Partners: Male    Birth control/protection: Surgical  Other Topics Concern  . Not on file  Social History Narrative   Lives at home with husband and one daughter   Right handed   Caffeine: 1 cup daily   Social Determinants of Health   Financial Resource Strain:   . Difficulty of Paying Living Expenses: Not on file  Food Insecurity:   . Worried About Charity fundraiser in the Last Year: Not on file  . Ran Out of Food in the Last Year: Not on file  Transportation Needs:   . Lack of Transportation (Medical): Not on file  . Lack of Transportation (Non-Medical): Not on file  Physical Activity:   . Days of Exercise per Week: Not on file  . Minutes of Exercise per Session: Not on file  Stress:   . Feeling of Stress : Not on file  Social Connections:   . Frequency of Communication with Friends and Family: Not on file  . Frequency of Social Gatherings with Friends and Family: Not on file  . Attends Religious Services: Not  on file  . Active Member of Clubs or Organizations: Not on file  . Attends Archivist Meetings: Not on file  . Marital Status: Not on file  Intimate Partner Violence:   . Fear of Current or Ex-Partner: Not on file  . Emotionally Abused: Not on file  . Physically Abused: Not on file  . Sexually Abused: Not on file     Current Outpatient Medications:  .  acetaminophen (TYLENOL) 650 MG CR tablet, Take 1,300 mg by mouth every 8 (eight) hours as needed for pain., Disp: , Rfl:  .  amiodarone (PACERONE) 200 MG tablet, Take 1 tablet (200 mg total) by mouth as directed. Once daily and may take second pill if needed for breakthrough afib (Patient taking differently: Take 200 mg by mouth See admin instructions. Take 1 tablet (200 mg) by mouth once daily at night & may take second pill if needed for breakthrough afib), Disp: 180 tablet, Rfl: 3 .  apixaban (ELIQUIS) 5 MG TABS tablet, Take 1 tablet (5 mg total) by mouth 2 (two) times daily., Disp: 180 tablet, Rfl: 3 .  DULoxetine (CYMBALTA) 20 MG capsule, Take 2 capsules (40 mg total) by mouth daily. (Patient taking differently: Take 20 mg by mouth 2 (two) times daily. ), Disp: 180 capsule, Rfl: 1 .  FINACEA 15 % FOAM, Apply 1 application topically daily as needed (rosacea). , Disp: , Rfl:  .  Fremanezumab-vfrm (AJOVY) 225 MG/1.5ML SOSY, Inject 225 mg into the skin every 30 (thirty) days., Disp: 6 Syringe, Rfl: 0 .  gabapentin (NEURONTIN) 100 MG capsule, TAKE 1 CAPSULE BY MOUTH TWICE DAILY (Patient taking differently: Take 100 mg by mouth 2 (two) times daily. ), Disp: 180 capsule, Rfl: 0 .  levocetirizine (XYZAL) 5 MG tablet, Take 5 mg by mouth daily as needed for allergies., Disp: , Rfl:  .  magnesium oxide (MAG-OX) 400 MG tablet, Take 400 mg by mouth daily. In the morning, Disp: , Rfl:  .  metoprolol succinate (TOPROL-XL) 25 MG 24 hr tablet, Take 1 tablet (25 mg total) by mouth daily. (Patient taking differently: Take 25 mg by mouth at bedtime.  ), Disp: 90 tablet, Rfl: 2 .  naratriptan (AMERGE) 2.5 MG tablet, Take 1 tablet (2.5 mg total) by mouth See admin instructions. ONSET OF HEADACHE. MAY REPEAT AFTER 4 HOURSIF  RETURNS OR DOES NOT RESOLVE MAX OF 2 TABS PER DAY.                  Dose:    2.5 mg, Disp: 9 tablet, Rfl: 5 .  omeprazole (PRILOSEC) 20 MG capsule, TAKE 1 CAPSULE BY MOUTH EVERY DAY (Patient taking differently: Take 20 mg by mouth daily. ), Disp: 90 capsule, Rfl: 1 .  Probiotic Product (PROBIOTIC PO), Take 1 capsule by mouth daily. , Disp: , Rfl:  .  propranolol (INDERAL) 10 MG tablet, TAKE ONE TABLET EVERY DAY WHEN TAKING EXTRA FLECAINIDE FOR BREAKTHROUGH A-FIB (Patient taking differently: Take 10 mg by mouth daily as needed (BREAKTHROUGH A-FIB). ), Disp: 30 tablet, Rfl: 1 .  mometasone (NASONEX) 50 MCG/ACT nasal spray, Place 1-2 sprays into the nose daily as needed (allergies.)., Disp: , Rfl:   Allergies  Allergen Reactions  . Adhesive [Tape] Dermatitis    SKIN TURNS RED-PAPER TAPE OK TO USE  . Levaquin [Levofloxacin] Other (See Comments)    Unsure of reaction type  . Macrodantin [Nitrofurantoin] Nausea And Vomiting  . Metronidazole Nausea Only  . Nitrofurantoin Macrocrystal Nausea And Vomiting  . Sulfamethoxazole-Trimethoprim Nausea Only  . Cephalexin Rash  . Clindamycin Hcl Rash  . Doxycycline Swelling and Rash  . Erythromycin Rash    I personally reviewed active problem list, medication list, allergies, family history, social history, health maintenance with the patient/caregiver today.   ROS  Constitutional: Negative for fever or weight change.  Respiratory: Negative for cough and shortness of breath.   Cardiovascular: Negative for chest pain, positive for intermittent  palpitations.  Gastrointestinal: Negative for abdominal pain, no bowel changes.  Musculoskeletal: Negative for gait problem or joint swelling.  Skin: Negative for rash.  Neurological: Negative for dizziness or headache.  No other specific  complaints in a complete review of systems (except as listed in HPI above).  Objective  Vitals:   03/14/19 1005  BP: 118/78  Pulse: 63  Resp: 16  Temp: 97.8 F (36.6 C)  TempSrc: Temporal  SpO2: 93%  Weight: 216 lb 11.2 oz (98.3 kg)  Height: 5\' 6"  (1.676 m)    Body mass index is 34.98 kg/m.  Physical Exam  Constitutional: Patient appears well-developed and well-nourished. Obese  No distress.  HEENT: head atraumatic, normocephalic, pupils equal and reactive to light Cardiovascular: Normal rate, regular rhythm and normal heart sounds.  No murmur heard. No BLE edema. Pulmonary/Chest: Effort normal and breath sounds normal. No respiratory distress. Abdominal: Soft.  There is no tenderness. Psychiatric: Patient has a normal mood and affect. behavior is normal. Judgment and thought content normal.   Recent Results (from the past 2160 hour(s))  Basic metabolic panel     Status: Abnormal   Collection Time: 02/20/19 11:02 AM  Result Value Ref Range   Sodium 141 135 - 145 mmol/L   Potassium 4.7 3.5 - 5.1 mmol/L   Chloride 104 98 - 111 mmol/L   CO2 28 22 - 32 mmol/L   Glucose, Bld 88 70 - 99 mg/dL   BUN 22 8 - 23 mg/dL   Creatinine, Ser 1.08 (H) 0.44 - 1.00 mg/dL   Calcium 8.9 8.9 - 10.3 mg/dL   GFR calc non Af Amer 54 (L) >60 mL/min   GFR calc Af Amer >60 >60 mL/min   Anion gap 9 5 - 15    Comment: Performed at [facility], [address] [place], [place] [address]  CBC     Status: None  Collection Time: 02/20/19 11:02 AM  Result Value Ref Range   WBC 6.4 4.0 - 10.5 K/uL   RBC 4.30 3.87 - 5.11 MIL/uL   Hemoglobin 13.2 12.0 - 15.0 g/dL   HCT 41.4 36.0 - 46.0 %   MCV 96.3 80.0 - 100.0 fL   MCH 30.7 26.0 - 34.0 pg   MCHC 31.9 30.0 - 36.0 g/dL   RDW 14.6 11.5 - 15.5 %   Platelets 287 150 - 400 K/uL   nRBC 0.0 0.0 - 0.2 %    Comment: Performed at [facility], [address] [place], [place] [address]  SARS CORONAVIRUS 2 (TAT 6-24 HRS)  Nasopharyngeal Nasopharyngeal Swab     Status: None   Collection Time: 02/26/19  3:17 PM   Specimen: Nasopharyngeal Swab  Result Value Ref Range   SARS Coronavirus 2 NEGATIVE NEGATIVE    Comment: (NOTE) SARS-CoV-2 target nucleic acids are NOT DETECTED. The SARS-CoV-2 RNA is generally detectable in upper and lower respiratory specimens during the acute phase of infection. Negative results do not preclude SARS-CoV-2 infection, do not rule out co-infections with other pathogens, and should not be used as the sole basis for treatment or other patient management decisions. Negative results must be combined with clinical observations, patient history, and epidemiological information. The expected result is Negative. Fact Sheet for Patients: SugarRoll.be Fact Sheet for Healthcare Providers: https://www.woods-mathews.com/ This test is not yet approved or cleared by the [place] FDA and  has been authorized for detection and/or diagnosis of SARS-CoV-2 by FDA under an Emergency Use Authorization (EUA). This EUA will remain  in effect (meaning this test can be used) for the duration of the COVID-19 declaration under Section 56 4(b)(1) of the Act, 21 U.S.C. section 360bbb-3(b)(1), unless the authorization is terminated or revoked sooner. Performed at [facility], [place] [address], [place], [place] [address]   POCT Activated clotting time     Status: None   Collection Time: 03/01/19  9:20 AM  Result Value Ref Range   Activated Clotting Time 290 seconds  POCT Activated clotting time     Status: None   Collection Time: 03/01/19  9:45 AM  Result Value Ref Range   Activated Clotting Time 340 seconds     PHQ2/9: Depression screen [facility] 2/9 03/14/2019 09/01/2018 03/13/2018 03/02/2018 08/30/2017  Decreased Interest 0 0 0 0 0  Down, Depressed, Hopeless 0 0 0 0 1  PHQ - 2 Score 0 0 0 0 1  Altered sleeping 3 0 1 3 0  Tired, decreased energy 0 0 2 1 1    Change in appetite 0 0 0 0 1  Feeling bad or failure about yourself  0 0 0 0 0  Trouble concentrating 0 0 0 0 0  Moving slowly or fidgety/restless 0 0 0 0 0  Suicidal thoughts 0 0 0 0 0  PHQ-9 Score 3 0 3 4 3   Difficult doing work/chores Not difficult at all - Not difficult at all Not difficult at all Not difficult at all  Some recent data might be hidden    phq 9 is negative   Fall Risk: Fall Risk  03/14/2019 09/01/2018 06/19/2018 03/13/2018 03/02/2018  Falls in the past year? 1 0 1 0 1  Number falls in past yr: 0 0 1 0 0  Comment - - pt states this was cardiac related - -  Injury with Fall? 1 0 1 0 1  Comment - - bumped head, CT ok per pt  -  Bilateral Knees and left arm  Risk for fall due to : - - Other (Comment) - -  Risk for fall due to: Comment - - heart problems - -  Follow up Falls evaluation completed - Falls prevention discussed - -     Functional Status Survey: Is the patient deaf or have difficulty hearing?: No Does the patient have difficulty seeing, even when wearing glasses/contacts?: No Does the patient have difficulty concentrating, remembering, or making decisions?: No Does the patient have difficulty walking or climbing stairs?: No Does the patient have difficulty dressing or bathing?: No Does the patient have difficulty doing errands alone such as visiting a doctor's office or shopping?: No    Assessment & Plan  1. OSA (obstructive sleep apnea)  Compliant with CPAP machine   2. Paroxysmal atrial fibrillation (HCC)   3. B12 deficiency  Resume supplementation   4. Vitamin D deficiency  Resume supplementation   5. History of diabetes mellitus   6. Fibromyalgia  - gabapentin (NEURONTIN) 100 MG capsule; Take 1 capsule (100 mg total) by mouth 2 (two) times daily.  Dispense: 180 capsule; Refill: 0 - DULoxetine (CYMBALTA) 20 MG capsule; Take 2 capsules (40 mg total) by mouth daily.  Dispense: 180 capsule; Refill: 1  7. Insomnia, unspecified  type  - traZODone (DESYREL) 50 MG tablet; Take 0.5-1 tablets (25-50 mg total) by mouth at bedtime as needed for sleep.  Dispense: 30 tablet; Refill: 0  8. Migraine without aura and without status migrainosus, not intractable  Doing well   9. Major depression in remission ([place])  Doing well   10. Abnormal urine odor  - CULTURE, URINE COMPREHENSIVE

## 2019-03-16 LAB — CULTURE, URINE COMPREHENSIVE
MICRO NUMBER:: 10139727
SPECIMEN QUALITY:: ADEQUATE

## 2019-03-19 ENCOUNTER — Other Ambulatory Visit: Payer: Self-pay | Admitting: Family Medicine

## 2019-03-19 ENCOUNTER — Encounter: Payer: Self-pay | Admitting: Family Medicine

## 2019-03-19 DIAGNOSIS — K219 Gastro-esophageal reflux disease without esophagitis: Secondary | ICD-10-CM

## 2019-03-19 MED ORDER — SULFAMETHOXAZOLE-TRIMETHOPRIM 400-80 MG PO TABS: 1.0000 | ORAL_TABLET | Freq: Two times a day (BID) | ORAL | 0 refills | Status: DC

## 2019-03-19 NOTE — Telephone Encounter (Signed)
Requested Prescriptions  Pending Prescriptions Disp Refills  . omeprazole (PRILOSEC) 20 MG capsule [Pharmacy Med Name: OMEPRAZOLE DR 20 MG CAP] 90 capsule 1    Sig: TAKE 1 CAPSULE BY MOUTH EVERY DAY     Gastroenterology: Proton Pump Inhibitors Passed - 03/19/2019  9:48 AM      Passed - Valid encounter within last 12 months    Recent Outpatient Visits          5 days ago OSA (obstructive sleep apnea)   [facility] Steele Sizer, MD   6 months ago Mild major depression [facility])   [facility] Steele Sizer, MD   1 year ago [facility] to [facility] preventive visit   [facility] Steele Sizer, MD   1 year ago Diet-controlled type 2 diabetes mellitus [facility])   [facility] Steele Sizer, MD   1 year ago Well woman exam   [facility] Steele Sizer, MD      Future Appointments            In 1 week Parrett, Fonnie Mu, NP [facility]    In 2 months [place], Ocie Doyne, MD [place]   In 2 months Gollan, Kathlene November, MD [facility], [place]   In 5 months Steele Sizer, MD [facility], [facility]

## 2019-03-20 ENCOUNTER — Ambulatory Visit: Payer: PRIVATE HEALTH INSURANCE

## 2019-03-27 ENCOUNTER — Ambulatory Visit (INDEPENDENT_AMBULATORY_CARE_PROVIDER_SITE_OTHER): Payer: Medicare Other | Admitting: Adult Health

## 2019-03-27 ENCOUNTER — Encounter: Payer: Self-pay | Admitting: Adult Health

## 2019-03-27 ENCOUNTER — Other Ambulatory Visit: Payer: Self-pay

## 2019-03-27 VITALS — BP 140/78 | HR 62 | Temp 98.7°F | Ht 66.0 in | Wt 216.0 lb

## 2019-03-27 DIAGNOSIS — G4733 Obstructive sleep apnea (adult) (pediatric): Secondary | ICD-10-CM | POA: Diagnosis not present

## 2019-03-27 DIAGNOSIS — E669 Obesity, unspecified: Secondary | ICD-10-CM

## 2019-03-27 DIAGNOSIS — Z6834 Body mass index (BMI) 34.0-34.9, adult: Secondary | ICD-10-CM | POA: Diagnosis not present

## 2019-03-27 HISTORY — DX: Obstructive sleep apnea (adult) (pediatric): G47.33

## 2019-03-27 NOTE — Progress Notes (Signed)
@Patient  ID: Sonya Lopez, female    DOB: September 27, 1955, 64 y.o.   MRN: [id number]  Chief Complaint  Patient presents with  . Follow-up    OSA     Referring provider: Steele Sizer, MD  HPI: 64 year old never smoker followed for obstructive sleep apnea Medical history significant for A. Fib  TEST/EVENTS :  2020 Home sleep study AHI 11   03/27/2019 Follow up : OSA  Patient returns for a 7-month follow-up for obstructive sleep apnea.  Patient is on nocturnal CPAP.  Says that she is doing very well on CPAP.  She feels rested.  She feels that CPAP is really helping her.  She is recently had a heart ablation and feels that the heart ablation along with a CPAP has really made her breathing a lot better and her sleep a lot better.  CPAP download shows excellent compliance with daily average usage at 7 hours.  Patient is on CPAP 13 cm H2O.  AHI is 1.3.  Minimum leaks.  Patient is a lifelong never smoker.  She did have some exposure to secondhand smoke with her father who was a heavy smoker.  It has been mentioned to patient that she had some hyperinflation on chest x-ray.  Chest x-ray was done on June 2020 showed clear lungs with no mention of hyperinflation. Alpha antitrypsin level was done and was normal with MM phenotype and level at 136. Patient denies any cough shortness of breath or wheezing.  She has been set up for PFTs however these have been rescheduled due to the pandemic.  We discussed her symptoms and she would like to cancel these going forward.  Allergies  Allergen Reactions  . Adhesive [Tape] Dermatitis    SKIN TURNS RED-PAPER TAPE OK TO USE  . Levaquin [Levofloxacin] Other (See Comments)    Unsure of reaction type  . Macrodantin [Nitrofurantoin] Nausea And Vomiting  . Metronidazole Nausea Only  . Nitrofurantoin Macrocrystal Nausea And Vomiting  . Sulfamethoxazole-Trimethoprim Nausea Only  . Cephalexin Rash  . Clindamycin Hcl Rash  . Doxycycline Swelling and Rash    . Erythromycin Rash    Immunization History  Administered Date(s) Administered  . Fluad Quad(high Dose 65+) 10/02/2018  . Influenza Split 10/25/2006, 10/07/2011  . Influenza, Quadrivalent, Recombinant, Inj, Pf 11/11/2017  . Influenza, Seasonal, Injecte, Preservative Fre 10/09/2010  . Influenza-Unspecified 10/23/2013, 11/05/2015, 11/10/2016  . Pneumococcal Conjugate-13 09/24/2015  . Pneumococcal Polysaccharide-23 04/17/2011, 09/01/2018  . Tdap 10/25/2006, 02/10/2017  . Zoster 11/16/2010  . Zoster Recombinat (Shingrix) 10/01/2017, 02/13/2018    Past Medical History:  Diagnosis Date  . Allergy   . Anxiety    situational anxiety  . Arrhythmia    A-fib/A-flutter  . Atrial flutter ([place])   . 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  MammoSite  . Cancer [facility]) 2017   Left- DCIS  . Depression   . Diet-controlled type 2 diabetes mellitus ([place])   . Difficult intubation 2009   [place] had to use  'boogee"  . Female cystocele 01/05/2011  . Fibromyalgia   . GERD (gastroesophageal reflux disease)   . Headache(784.0)   . Kidney stones   . Obesity   . OSA (obstructive sleep apnea) 03/27/2019  . Personal history of radiation therapy 2017   LEFT lumpectomy  . PONV (postoperative nausea and vomiting)    h/o difficult intubation and anes had to use "boogee"  . Vitreous detachment of right  eye    sees floaters    Tobacco History: Social History   Tobacco Use  Smoking Status Never Smoker  Smokeless Tobacco Never Used   Counseling given: Not Answered   Outpatient Medications Prior to Visit  Medication Sig Dispense Refill  . acetaminophen (TYLENOL) 650 MG CR tablet Take 1,300 mg by mouth every 8 (eight) hours as needed for pain.    Marland Kitchen amiodarone (PACERONE) 200 MG tablet Take 1 tablet (200 mg total) by mouth as directed. Once daily and may take second pill if needed for breakthrough afib (Patient  taking differently: Take 200 mg by mouth See admin instructions. Take 1 tablet (200 mg) by mouth once daily at night & may take second pill if needed for breakthrough afib) 180 tablet 3  . apixaban (ELIQUIS) 5 MG TABS tablet Take 1 tablet (5 mg total) by mouth 2 (two) times daily. 180 tablet 3  . DULoxetine (CYMBALTA) 20 MG capsule Take 2 capsules (40 mg total) by mouth daily. 180 capsule 1  . FINACEA 15 % FOAM Apply 1 application topically daily as needed (rosacea).     . Fremanezumab-vfrm (AJOVY) 225 MG/1.5ML SOSY Inject 225 mg into the skin every 30 (thirty) days. 6 Syringe 0  . gabapentin (NEURONTIN) 100 MG capsule Take 1 capsule (100 mg total) by mouth 2 (two) times daily. 180 capsule 0  . magnesium oxide (MAG-OX) 400 MG tablet Take 400 mg by mouth daily. In the morning    . metoprolol succinate (TOPROL-XL) 25 MG 24 hr tablet Take 1 tablet (25 mg total) by mouth daily. (Patient taking differently: Take 25 mg by mouth at bedtime. ) 90 tablet 2  . naratriptan (AMERGE) 2.5 MG tablet Take 1 tablet (2.5 mg total) by mouth See admin instructions. ONSET OF HEADACHE. MAY REPEAT AFTER 4 HOURSIF RETURNS OR DOES NOT RESOLVE MAX OF 2 TABS PER DAY.                  Dose:    2.5 mg 9 tablet 5  . omeprazole (PRILOSEC) 20 MG capsule TAKE 1 CAPSULE BY MOUTH EVERY DAY 90 capsule 1  . Probiotic Product (PROBIOTIC PO) Take 1 capsule by mouth daily.     . propranolol (INDERAL) 10 MG tablet TAKE ONE TABLET EVERY DAY WHEN TAKING EXTRA FLECAINIDE FOR BREAKTHROUGH A-FIB (Patient taking differently: Take 10 mg by mouth daily as needed (BREAKTHROUGH A-FIB). ) 30 tablet 1  . traZODone (DESYREL) 50 MG tablet Take 0.5-1 tablets (25-50 mg total) by mouth at bedtime as needed for sleep. 30 tablet 0  . sulfamethoxazole-trimethoprim (BACTRIM) 400-80 MG tablet Take 1 tablet by mouth 2 (two) times daily. 6 tablet 0   No facility-administered medications prior to visit.     Review of Systems:   Constitutional:    No  weight loss, night sweats,  Fevers, chills, fatigue, or  lassitude.  HEENT:   No headaches,  Difficulty swallowing,  Tooth/dental problems, or  Sore throat,                No sneezing, itching, ear ache, nasal congestion, post nasal drip,   CV:  No chest pain,  Orthopnea, PND, swelling in lower extremities, anasarca, dizziness, palpitations, syncope.   GI  No heartburn, indigestion, abdominal pain, nausea, vomiting, diarrhea, change in bowel habits, loss of appetite, bloody stools.   Resp: No shortness of breath with exertion or at rest.  No excess mucus, no productive cough,  No non-productive cough,  No coughing  up of blood.  No change in color of mucus.  No wheezing.  No chest wall deformity  Skin: no rash or lesions.  GU: no dysuria, change in color of urine, no urgency or frequency.  No flank pain, no hematuria   MS:  No joint pain or swelling.  No decreased range of motion.  No back pain.    Physical Exam  BP 140/78 (BP Location: Left Arm, Cuff Size: Normal)   Pulse 62   Temp 98.7 F (37.1 C) (Temporal)   Ht 5\' 6"  (1.676 m)   Wt 216 lb (98 kg)   SpO2 95%   BMI 34.86 kg/m   GEN: A/Ox3; pleasant , NAD, BMI 34   HEENT:  [place]/AT,  NOSE-clear, THROAT-clear, no lesions, no postnasal drip or exudate noted.   NECK:  Supple w/ fair ROM; no JVD; normal carotid impulses w/o bruits; no thyromegaly or nodules palpated; no lymphadenopathy.    RESP  Clear  P & A; w/o, wheezes/ rales/ or rhonchi. no accessory muscle use, no dullness to percussion  CARD:  RRR, no m/r/g, no peripheral edema, pulses intact, no cyanosis or clubbing.  GI:   Soft & nt; nml bowel sounds; no organomegaly or masses detected.   Musco: Warm bil, no deformities or joint swelling noted.   Neuro: alert, no focal deficits noted.    Skin: Warm, no lesions or rashes    Lab Results:  CBC   BNP No results found for: BNP  ProBNP No results found for: PROBNP    No flowsheet data found.  No  results found for: NITRICOXIDE      Assessment & Plan:   OSA (obstructive sleep apnea) Excellent control and compliance on CPAP  Plan  Patient Instructions  Continue on CPAP At bedtime.  Try Dream wear full face mask .  Keep up good work  Work on [employer] .  Do not drive if sleepy .  Follow up with Dr. Mortimer Fries in 1 year and As needed         Obesity Healthy weight loss     Rexene Edison, NP 03/27/2019

## 2019-03-27 NOTE — Assessment & Plan Note (Signed)
Healthy weight loss 

## 2019-03-27 NOTE — Assessment & Plan Note (Signed)
Excellent control and compliance on CPAP  Plan  Patient Instructions  Continue on CPAP At bedtime.  Try Dream wear full face mask .  Keep up good work  Work on [employer] .  Do not drive if sleepy .  Follow up with Dr. Mortimer Fries in 1 year and As needed

## 2019-03-27 NOTE — Patient Instructions (Signed)
Continue on CPAP At bedtime.  Try Dream wear full face mask .  Keep up good work  Work on [employer] .  Do not drive if sleepy .  Follow up with Dr. Mortimer Fries in 1 year and As needed

## 2019-03-29 ENCOUNTER — Encounter (HOSPITAL_COMMUNITY): Payer: Self-pay | Admitting: Nurse Practitioner

## 2019-03-29 ENCOUNTER — Ambulatory Visit (HOSPITAL_COMMUNITY)
Admission: RE | Admit: 2019-03-29 | Discharge: 2019-03-29 | Disposition: A | Discharge status: Home / Self Care | Payer: Medicare Other | Source: Ambulatory Visit | Attending: Nurse Practitioner | Admitting: Nurse Practitioner

## 2019-03-29 ENCOUNTER — Other Ambulatory Visit: Payer: Self-pay

## 2019-03-29 VITALS — BP 134/70 | HR 60 | Ht 66.0 in | Wt 215.8 lb

## 2019-03-29 DIAGNOSIS — M797 Fibromyalgia: Secondary | ICD-10-CM | POA: Insufficient documentation

## 2019-03-29 DIAGNOSIS — T884XXA Failed or difficult intubation, initial encounter: Secondary | ICD-10-CM | POA: Insufficient documentation

## 2019-03-29 DIAGNOSIS — Z7901 Long term (current) use of anticoagulants: Secondary | ICD-10-CM | POA: Insufficient documentation

## 2019-03-29 DIAGNOSIS — I480 Paroxysmal atrial fibrillation: Secondary | ICD-10-CM | POA: Diagnosis not present

## 2019-03-29 DIAGNOSIS — E118 Type 2 diabetes mellitus with unspecified complications: Secondary | ICD-10-CM | POA: Insufficient documentation

## 2019-03-29 DIAGNOSIS — F329 Major depressive disorder, single episode, unspecified: Secondary | ICD-10-CM | POA: Diagnosis not present

## 2019-03-29 DIAGNOSIS — I4892 Unspecified atrial flutter: Secondary | ICD-10-CM | POA: Diagnosis not present

## 2019-03-29 DIAGNOSIS — D6869 Other thrombophilia: Secondary | ICD-10-CM

## 2019-03-29 DIAGNOSIS — Z79899 Other long term (current) drug therapy: Secondary | ICD-10-CM | POA: Insufficient documentation

## 2019-03-29 DIAGNOSIS — Z8249 Family history of ischemic heart disease and other diseases of the circulatory system: Secondary | ICD-10-CM | POA: Insufficient documentation

## 2019-03-29 DIAGNOSIS — F419 Anxiety disorder, unspecified: Secondary | ICD-10-CM | POA: Diagnosis not present

## 2019-03-29 LAB — COMPREHENSIVE METABOLIC PANEL
ALT: 20 U/L (ref 0–44)
AST: 16 U/L (ref 15–41)
Albumin: 3.6 g/dL (ref 3.5–5.0)
Alkaline Phosphatase: 117 U/L (ref 38–126)
Anion gap: 10 (ref 5–15)
BUN: 20 mg/dL (ref 8–23)
CO2: 25 mmol/L (ref 22–32)
Calcium: 8.9 mg/dL (ref 8.9–10.3)
Chloride: 106 mmol/L (ref 98–111)
Creatinine, Ser: 0.92 mg/dL (ref 0.44–1.00)
GFR calc Af Amer: >60 mL/min (ref >60)
GFR calc non Af Amer: >60 mL/min (ref >60)
Glucose, Bld: 75 mg/dL (ref 70–99)
Potassium: 4.4 mmol/L (ref 3.5–5.1)
Sodium: 141 mmol/L (ref 135–145)
Total Bilirubin: 0.2 mg/dL — ABNORMAL LOW (ref 0.3–1.2)
Total Protein: 6.5 g/dL (ref 6.5–8.1)

## 2019-03-29 LAB — TSH: TSH: 2.958 u[IU]/mL (ref 0.350–4.500)

## 2019-03-29 NOTE — Progress Notes (Signed)
Primary Care Physician: Steele Sizer, MD Referring Physician: Dr. Everardo Beals is a 64 y.o. female with a h/o paroxysmal afib that is in the afib clinic one month s/p afib ablation. She remains on amiodarone 200 mg for rhythm control. She reports that she has not  noted any palpitations, no aifb.. Continues on eliquis  5 mg  bid for CHA2DS2VASc of 2. No groin issues, has noted that she has had to chew her food more thoroughly for it to go down better since ablation. No food has got hung.No indigestion  Issues.   Today, she denies symptoms of palpitations, chest pain, shortness of breath, orthopnea, PND, lower extremity edema, dizziness, presyncope, syncope, or neurologic sequela. The patient is tolerating medications without difficulties and is otherwise without complaint today.   Past Medical History:  Diagnosis Date  . Allergy   . Anxiety    situational anxiety  . Arrhythmia    A-fib/A-flutter  . Atrial flutter ([place])   . 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  MammoSite  . Cancer [facility]) 2017   Left- DCIS  . Depression   . Diet-controlled type 2 diabetes mellitus ([place])   . Difficult intubation 2009   [place] had to use  'boogee"  . Female cystocele 01/05/2011  . Fibromyalgia   . GERD (gastroesophageal reflux disease)   . Headache(784.0)   . Kidney stones   . Obesity   . OSA (obstructive sleep apnea) 03/27/2019  . Personal history of radiation therapy 2017   LEFT lumpectomy  . PONV (postoperative nausea and vomiting)    h/o difficult intubation and anes had to use "boogee"  . Vitreous detachment of right eye    sees floaters   Past Surgical History:  Procedure Laterality Date  . ABDOMINAL HYSTERECTOMY  2013  . ANTERIOR AND POSTERIOR REPAIR  11/30/2011   Procedure: ANTERIOR (CYSTOCELE) AND POSTERIOR REPAIR (RECTOCELE);  Surgeon: Reece Packer, MD;  Location: [place]  [facility];  Service: Urology;  Laterality: N/A;  with cysto  . ATRIAL FIBRILLATION ABLATION N/A 03/01/2019   Procedure: ATRIAL FIBRILLATION ABLATION;  Surgeon: Constance Haw, MD;  Location: [place] CV LAB;  Service: Cardiovascular;  Laterality: N/A;  . BLADDER SUSPENSION  11/30/2011   Procedure: [place] PROCEDURE;  Surgeon: Reece Packer, MD;  Location: [place] [facility];  Service: Urology;  Laterality: N/A;  graft 10x6  . BREAST BIOPSY Right 2007   negative core  . BREAST BIOPSY Left 09/16/2015   DCIS  . BREAST LUMPECTOMY Left 09/30/2015   DCIS clear margins and rad tx/HIGH GRADE DUCTAL CARCINOMA IN SITU, COMEDO TYPE  . BREAST LUMPECTOMY WITH NEEDLE LOCALIZATION Left 09/30/2015   DCIS excision, MammoSite radiation;  Surgeon: Robert Bellow, MD;  Location: [facility] ORS;  Service: General;  Laterality: Left;  . CATARACT EXTRACTION Left 01/2019  . CESAREAN SECTION     x 1  . CHOLECYSTECTOMY  2005  . COLONOSCOPY  2009  . CYSTOSCOPY  11/30/2011   Procedure: CYSTOSCOPY;  Surgeon: Reece Packer, MD;  Location: [place] [facility];  Service: Urology;;  . Brigitte Pulse AND CURETTAGE OF UTERUS  05/05/2007  . GALLBLADDER SURGERY  2004  . TONSILLECTOMY  1961  . VAGINAL HYSTERECTOMY  11/30/2011   Procedure: HYSTERECTOMY VAGINAL;  Surgeon: Emily Filbert, MD;  Location: [place] [facility];  Service: Gynecology;  Laterality: N/A;    Current Outpatient Medications  Medication Sig Dispense Refill  .  acetaminophen (TYLENOL) 650 MG CR tablet Take 1,300 mg by mouth every 8 (eight) hours as needed for pain.    Marland Kitchen amiodarone (PACERONE) 200 MG tablet Take 1 tablet (200 mg total) by mouth as directed. Once daily and may take second pill if needed for breakthrough afib (Patient taking differently: Take 200 mg by mouth See admin instructions. Take 1 tablet (200 mg) by mouth once daily at night & may take second pill if needed for breakthrough afib) 180 tablet 3  . apixaban (ELIQUIS) 5 MG TABS tablet Take 1 tablet (5 mg total) by mouth 2 (two)  times daily. 180 tablet 3  . DULoxetine (CYMBALTA) 20 MG capsule Take 2 capsules (40 mg total) by mouth daily. 180 capsule 1  . FINACEA 15 % FOAM Apply 1 application topically daily as needed (rosacea).     . Fremanezumab-vfrm (AJOVY) 225 MG/1.5ML SOSY Inject 225 mg into the skin every 30 (thirty) days. 6 Syringe 0  . gabapentin (NEURONTIN) 100 MG capsule Take 1 capsule (100 mg total) by mouth 2 (two) times daily. 180 capsule 0  . magnesium oxide (MAG-OX) 400 MG tablet Take 400 mg by mouth daily. In the morning    . metoprolol succinate (TOPROL-XL) 25 MG 24 hr tablet Take 1 tablet (25 mg total) by mouth daily. (Patient taking differently: Take 25 mg by mouth at bedtime. ) 90 tablet 2  . naratriptan (AMERGE) 2.5 MG tablet Take 1 tablet (2.5 mg total) by mouth See admin instructions. ONSET OF HEADACHE. MAY REPEAT AFTER 4 HOURSIF RETURNS OR DOES NOT RESOLVE MAX OF 2 TABS PER DAY.                  Dose:    2.5 mg 9 tablet 5  . omeprazole (PRILOSEC) 20 MG capsule TAKE 1 CAPSULE BY MOUTH EVERY DAY 90 capsule 1  . Probiotic Product (PROBIOTIC PO) Take 1 capsule by mouth daily.     . propranolol (INDERAL) 10 MG tablet TAKE ONE TABLET EVERY DAY WHEN TAKING EXTRA FLECAINIDE FOR BREAKTHROUGH A-FIB (Patient taking differently: Take 10 mg by mouth daily as needed (BREAKTHROUGH A-FIB). ) 30 tablet 1  . traZODone (DESYREL) 50 MG tablet Take 0.5-1 tablets (25-50 mg total) by mouth at bedtime as needed for sleep. 30 tablet 0   No current facility-administered medications for this encounter.    Allergies  Allergen Reactions  . Adhesive [Tape] Dermatitis    SKIN TURNS RED-PAPER TAPE OK TO USE  . Levaquin [Levofloxacin] Other (See Comments)    Unsure of reaction type  . Macrodantin [Nitrofurantoin] Nausea And Vomiting  . Metronidazole Nausea Only  . Nitrofurantoin Macrocrystal Nausea And Vomiting  . Sulfamethoxazole-Trimethoprim Nausea Only  . Cephalexin Rash  . Clindamycin Hcl Rash  .  Doxycycline Swelling and Rash  . Erythromycin Rash    Social History   Socioeconomic History  . Marital status: Married    Spouse name: Environmental consultant   . Number of children: 3  . Years of education: 16  . Highest education level: Bachelor's degree (e.g., BA, AB, BS)  Occupational History  . Not on file  Tobacco Use  . Smoking status: Never Smoker  . Smokeless tobacco: Never Used  Substance and Sexual Activity  . Alcohol use: Not Currently    Alcohol/week: 0.0 standard drinks    Comment: Due to AF  . Drug use: No  . Sexual activity: Yes    Partners: Male    Birth control/protection: Surgical  Other Topics Concern  .  Not on file  Social History Narrative   Lives at home with husband and one daughter   Right handed   Caffeine: 1 cup daily   Social Determinants of Health   Financial Resource Strain:   . Difficulty of Paying Living Expenses: Not on file  Food Insecurity:   . Worried About Charity fundraiser in the Last Year: Not on file  . Ran Out of Food in the Last Year: Not on file  Transportation Needs:   . Lack of Transportation (Medical): Not on file  . Lack of Transportation (Non-Medical): Not on file  Physical Activity:   . Days of Exercise per Week: Not on file  . Minutes of Exercise per Session: Not on file  Stress:   . Feeling of Stress : Not on file  Social Connections:   . Frequency of Communication with Friends and Family: Not on file  . Frequency of Social Gatherings with Friends and Family: Not on file  . Attends Religious Services: Not on file  . Active Member of Clubs or Organizations: Not on file  . Attends Archivist Meetings: Not on file  . Marital Status: Not on file  Intimate Partner Violence:   . Fear of Current or Ex-Partner: Not on file  . Emotionally Abused: Not on file  . Physically Abused: Not on file  . Sexually Abused: Not on file    Family History  Problem Relation Age of Onset  . Diabetes Mother   . Hearing loss Mother     . Hypertension Mother   . Heart disease Father   . Heart disease Paternal Grandfather   . Asthma Daughter   . Asthma Daughter   . Migraines Daughter   . Fibromyalgia Daughter   . Interstitial cystitis Daughter   . Asthma Daughter   . Colitis Daughter   . Thyroid cancer Daughter 22  . Diabetes Brother   . Hypertension Brother   . Breast cancer Neg Hx     ROS- All systems are reviewed and negative except as per the HPI above  Physical Exam: There were no vitals filed for this visit. Wt Readings from Last 3 Encounters:  03/27/19 98 kg  03/14/19 98.3 kg  03/01/19 95.7 kg    Labs: Lab Results  Component Value Date   NA 141 02/20/2019   K 4.7 02/20/2019   CL 104 02/20/2019   CO2 28 02/20/2019   GLUCOSE 88 02/20/2019   BUN 22 02/20/2019   CREATININE 1.08 (H) 02/20/2019   CALCIUM 8.9 02/20/2019   MG 2.1 05/06/2018   Lab Results  Component Value Date   INR 0.93 11/23/2011   Lab Results  Component Value Date   CHOL 186 03/02/2018   HDL 60 03/02/2018   LDLCALC 108 (H) 03/02/2018   TRIG 87 03/02/2018     GEN- The patient is well appearing, alert and oriented x 3 today.   Head- normocephalic, atraumatic Eyes-  Sclera clear, conjunctiva pink Ears- hearing intact Oropharynx- clear Neck- supple, no JVP Lymph- no cervical lymphadenopathy Lungs- Clear to ausculation bilaterally, normal work of breathing Heart- Regular rate and rhythm, no murmurs, rubs or gallops, PMI not laterally displaced GI- soft, NT, ND, + BS Extremities- no clubbing, cyanosis, or edema MS- no significant deformity or atrophy Skin- no rash or lesion Psych- euthymic mood, full affect Neuro- strength and sensation are intact  EKG-NSR at 60 bpm, pr int 152 ms, qrs int 82 ms, qtc 434 ms   Assessment  and Plan: 1. Paroxysmal afib S/p ablation Doing well staying in SR Continue amiodarone 200 mg daily  Continue BB without change  She will monitor  her mild swallowing issue, if worsens call the  office I will mention to [facility] but she feels it is improving since the procedure  Cmet/tsh today for amiodarone surveillance   2. CHA2DS2VASc score of 2  Continue  eliquis 5 mg bid   F/u with Dr. Curt Bears 5/4//21  Geroge Baseman. Nieshia Larmon, [facility] [address] [place],  [id number] [phone]

## 2019-03-30 ENCOUNTER — Other Ambulatory Visit (HOSPITAL_COMMUNITY): Payer: Self-pay | Admitting: *Deleted

## 2019-03-30 DIAGNOSIS — K219 Gastro-esophageal reflux disease without esophagitis: Secondary | ICD-10-CM

## 2019-03-30 MED ORDER — OMEPRAZOLE 20 MG PO CPDR: DELAYED_RELEASE_CAPSULE | ORAL | 1 refills | Status: DC

## 2019-04-20 ENCOUNTER — Ambulatory Visit (INDEPENDENT_AMBULATORY_CARE_PROVIDER_SITE_OTHER): Payer: Medicare Other

## 2019-04-20 VITALS — Temp 98.4°F | Ht 66.0 in | Wt 216.0 lb

## 2019-04-20 DIAGNOSIS — Z0000 Encounter for general adult medical examination without abnormal findings: Secondary | ICD-10-CM

## 2019-04-20 NOTE — Progress Notes (Signed)
Subjective:   Sonya Lopez is a 64 y.o. female who presents for Medicare Annual (Subsequent) preventive examination.  Virtual Visit via Telephone Note  I connected with Sonya Lopez on 04/20/19 at  8:00 AM EDT by telephone and verified that I am speaking with the correct person using two identifiers.  Medicare Annual Wellness visit completed telephonically due to Covid-19 pandemic.   Location: Patient: home Provider: office   I discussed the limitations, risks, security and privacy concerns of performing an evaluation and management service by telephone and the availability of in person appointments. The patient expressed understanding and agreed to proceed.  Some vital signs may be absent or patient reported.   Clemetine Marker, LPN    Review of Systems:   Cardiac Risk Factors include: advanced age (>55men, >61 women);obesity (BMI >30kg/m2)     Objective:     Vitals: Temp 98.4 F (36.9 C)   Ht 5\' 6"  (1.676 m)   Wt 216 lb (98 kg)   BMI 34.86 kg/m   Body mass index is 34.86 kg/m.  Advanced Directives 03/01/2019 11/28/2018 05/06/2018 11/07/2017 11/03/2016 07/13/2016 05/05/2016  Does Patient Have a Medical Advance Directive? Yes Yes Yes Yes Yes Yes No  Type of Paramedic of [place];Living will [place];Living will [place];Living will [place];Living will [place];Living will [place];Living will -  Does patient want to make changes to medical advance directive? No - Patient declined - - - - - -  Copy of [place] in Chart? No - copy requested - - - No - copy requested - -  Would patient like information on creating a medical advance directive? - - - - - - No - Patient declined  Pre-existing out of facility DNR order (yellow form or pink MOST form) - - - - - - -    Tobacco Social History   Tobacco Use  Smoking Status Never  Smoker  Smokeless Tobacco Never Used     Counseling given: Not Answered   Clinical Intake:  Pre-visit preparation completed: Yes  Pain : 0-10 Pain Score: 7  Pain Type: Chronic pain Pain Location: (fibromyalgia)     BMI - recorded: 34.86 Nutritional Status: BMI > 30  Obese Nutritional Risks: None Diabetes: No  How often do you need to have someone help you when you read instructions, pamphlets, or other written materials from your doctor or pharmacy?: 1 - Never  Interpreter Needed?: No  Information entered by :: Clemetine Marker LPN  Past Medical History:  Diagnosis Date  . Allergy   . Anxiety    situational anxiety  . Arrhythmia    A-fib/A-flutter  . Atrial flutter ([place])   . 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  MammoSite  . Cancer [facility]) 2017   Left- DCIS  . Depression   . Diet-controlled type 2 diabetes mellitus ([place])   . Difficult intubation 2009   Bulger had to use  'boogee"  . Female cystocele 01/05/2011  . Fibromyalgia   . GERD (gastroesophageal reflux disease)   . Headache(784.0)   . Kidney stones   . Obesity   . OSA (obstructive sleep apnea) 03/27/2019  . Personal history of radiation therapy 2017   LEFT lumpectomy  . PONV (postoperative nausea and vomiting)    h/o difficult intubation and anes had to use "boogee"  .  Vitreous detachment of right eye    sees floaters   Past Surgical History:  Procedure Laterality Date  . ABDOMINAL HYSTERECTOMY  2013  . ANTERIOR AND POSTERIOR REPAIR  11/30/2011   Procedure: ANTERIOR (CYSTOCELE) AND POSTERIOR REPAIR (RECTOCELE);  Surgeon: Reece Packer, MD;  Location: [facility];  Service: Urology;  Laterality: N/A;  with cysto  . ATRIAL FIBRILLATION ABLATION N/A 03/01/2019   Procedure: ATRIAL FIBRILLATION ABLATION;  Surgeon: Constance Haw, MD;  Location: [facility];  Service: Cardiovascular;  Laterality: N/A;  .  BLADDER SUSPENSION  11/30/2011   Procedure: [place] PROCEDURE;  Surgeon: Reece Packer, MD;  Location: [facility];  Service: Urology;  Laterality: N/A;  graft 10x6  . BREAST BIOPSY Right 2007   negative core  . BREAST BIOPSY Left 09/16/2015   DCIS  . BREAST LUMPECTOMY Left 09/30/2015   DCIS clear margins and rad tx/HIGH GRADE DUCTAL CARCINOMA IN SITU, COMEDO TYPE  . BREAST LUMPECTOMY WITH NEEDLE LOCALIZATION Left 09/30/2015   DCIS excision, MammoSite radiation;  Surgeon: Robert Bellow, MD;  Location: [facility];  Service: General;  Laterality: Left;  . CATARACT EXTRACTION Left 01/2019  . CESAREAN SECTION     x 1  . CHOLECYSTECTOMY  2005  . COLONOSCOPY  2009  . CYSTOSCOPY  11/30/2011   Procedure: CYSTOSCOPY;  Surgeon: Reece Packer, MD;  Location: [facility];  Service: Urology;;  . Brigitte Pulse AND CURETTAGE OF UTERUS  05/05/2007  . GALLBLADDER SURGERY  2004  . TONSILLECTOMY  1961  . VAGINAL HYSTERECTOMY  11/30/2011   Procedure: HYSTERECTOMY VAGINAL;  Surgeon: Emily Filbert, MD;  Location: [facility];  Service: Gynecology;  Laterality: N/A;   Family History  Problem Relation Age of Onset  . Diabetes Mother   . Hearing loss Mother   . Hypertension Mother   . Heart disease Father   . Heart disease Paternal Grandfather   . Asthma Daughter   . Asthma Daughter   . Migraines Daughter   . Fibromyalgia Daughter   . Interstitial cystitis Daughter   . Asthma Daughter   . Colitis Daughter   . Thyroid cancer Daughter 77  . Diabetes Brother   . Hypertension Brother   . Breast cancer Neg Hx    Social History   Socioeconomic History  . Marital status: Married    Spouse name: Environmental consultant   . Number of children: 3  . Years of education: 16  . Highest education level: Bachelor's degree (e.g., BA, AB, BS)  Occupational History  . Not on file  Tobacco Use  . Smoking status: Never Smoker  . Smokeless tobacco: Never Used  Substance and Sexual Activity  . Alcohol use: Not Currently    Alcohol/week:  0.0 standard drinks    Comment: Due to AF  . Drug use: No  . Sexual activity: Yes    Partners: Male    Birth control/protection: Surgical  Other Topics Concern  . Not on file  Social History Narrative   Lives at home with husband and one daughter   Right handed   Caffeine: 1 cup daily   Social Determinants of Health   Financial Resource Strain: Low Risk   . Difficulty of Paying Living Expenses: Not hard at all  Food Insecurity: No Food Insecurity  . Worried About Charity fundraiser in the Last Year: Never true  . Ran Out of Food in the Last Year: Never true  Transportation Needs: No Transportation Needs  . Lack of  Transportation (Medical): No  . Lack of Transportation (Non-Medical): No  Physical Activity: Insufficiently Active  . Days of Exercise per Week: 4 days  . Minutes of Exercise per Session: 20 min  Stress: No Stress Concern Present  . Feeling of Stress : Not at all  Social Connections: Not Isolated  . Frequency of Communication with Friends and Family: More than three times a week  . Frequency of Social Gatherings with Friends and Family: More than three times a week  . Attends Religious Services: More than 4 times per year  . Active Member of Clubs or Organizations: Yes  . Attends Archivist Meetings: More than 4 times per year  . Marital Status: Married    Outpatient Encounter Medications as of 04/20/2019  Medication Sig  . acetaminophen (TYLENOL) 650 MG CR tablet Take 1,300 mg by mouth as needed for pain.   Marland Kitchen amiodarone (PACERONE) 200 MG tablet Take 1 tablet (200 mg total) by mouth as directed. Once daily and may take second pill if needed for breakthrough afib (Patient taking differently: Take 200 mg by mouth See admin instructions. Take 1 tablet (200 mg) by mouth once daily at night & may take second pill if needed for breakthrough afib)  . apixaban (ELIQUIS) 5 MG TABS tablet Take 1 tablet (5 mg total) by mouth 2 (two) times daily.  . Cholecalciferol  (PA VITAMIN D-3 GUMMY PO) Takes 2 gummies daily  . DULoxetine (CYMBALTA) 20 MG capsule Take 2 capsules (40 mg total) by mouth daily.  Marland Kitchen FINACEA 15 % FOAM Apply 1 application topically daily as needed (rosacea).   . Fremanezumab-vfrm (AJOVY) 225 MG/1.5ML SOSY Inject 225 mg into the skin every 30 (thirty) days.  Marland Kitchen gabapentin (NEURONTIN) 100 MG capsule Take 1 capsule (100 mg total) by mouth 2 (two) times daily.  . magnesium oxide (MAG-OX) 400 MG tablet Take 400 mg by mouth daily. In the morning  . metoprolol succinate (TOPROL-XL) 25 MG 24 hr tablet Take 1 tablet (25 mg total) by mouth daily. (Patient taking differently: Take 25 mg by mouth at bedtime. )  . naratriptan (AMERGE) 2.5 MG tablet Take 1 tablet (2.5 mg total) by mouth See admin instructions. ONSET OF HEADACHE. MAY REPEAT AFTER 4 HOURSIF RETURNS OR DOES NOT RESOLVE MAX OF 2 TABS PER DAY.                  Dose:    2.5 mg  . omeprazole (PRILOSEC) 20 MG capsule Take 1 tablet by mouth twice a day for 1 month then reduce back to 1 tablet daily  . Probiotic Product (PROBIOTIC PO) Take 1 capsule by mouth daily.   . propranolol (INDERAL) 10 MG tablet TAKE ONE TABLET EVERY DAY WHEN TAKING EXTRA FLECAINIDE FOR BREAKTHROUGH A-FIB (Patient taking differently: Take 10 mg by mouth daily as needed (BREAKTHROUGH A-FIB). )  . traZODone (DESYREL) 50 MG tablet Take 0.5-1 tablets (25-50 mg total) by mouth at bedtime as needed for sleep.  . vitamin B-12 (CYANOCOBALAMIN) 1000 MCG tablet Take 1,000 mcg by mouth every 7 (seven) days.  . [DISCONTINUED] flecainide (TAMBOCOR) 100 MG tablet Take 1 tablet (100 mg total) by mouth 2 (two) times daily.   No facility-administered encounter medications on file as of 04/20/2019.    Activities of Daily Living In your present state of health, do you have any difficulty performing the following activities: 04/20/2019 03/14/2019  Hearing? N N  Comment declines hearing aids -  Vision? N N  Difficulty  concentrating  or making decisions? N N  Walking or climbing stairs? N N  Dressing or bathing? N N  Doing errands, shopping? N N  Preparing Food and eating ? N -  Using the Toilet? N -  In the past six months, have you accidently leaked urine? Y -  Comment wears pads for protection -  Do you have problems with loss of bowel control? N -  Managing your Medications? N -  Managing your Finances? N -  Housekeeping or managing your Housekeeping? N -  Some recent data might be hidden    Patient Care Team: Steele Sizer, MD as PCP - General Rockey Situ, Kathlene November, MD as Consulting Physician (Cardiology) Bary Castilla, Forest Gleason, MD (General Surgery) [name], MD as Consulting Physician (Neurology) Brendolyn Patty, MD (Dermatology)    Assessment:   This is a routine wellness examination for Sonya Lopez.  Exercise Activities and Dietary recommendations Current Exercise Habits: Home exercise routine, Type of exercise: walking, Time (Minutes): 20, Frequency (Times/Week): 4, Weekly Exercise (Minutes/Week): 80, Intensity: Moderate, Exercise limited by: cardiac condition(s)  Goals    . Weight (lb) < 200 lb (90.7 kg)     Pt would like to lose weight over the next year with diet and exercise       Fall Risk Fall Risk  04/20/2019 03/14/2019 09/01/2018 06/19/2018 03/13/2018  Falls in the past year? 1 1 0 1 0  Number falls in past yr: 1 0 0 1 0  Comment - - - pt states this was cardiac related -  Injury with Fall? 1 1 0 1 0  Comment - - - bumped head, CT ok per pt  -  Risk for fall due to : History of fall(s) - - Other (Comment) -  Risk for fall due to: Comment - - - heart problems -  Follow up Falls prevention discussed Falls evaluation completed - Falls prevention discussed -   FALL RISK PREVENTION PERTAINING TO THE HOME:  Any stairs in or around the home? Yes  If so, do they handrails? Yes   Home free of loose throw rugs in walkways, pet beds, electrical cords, etc? Yes  Adequate lighting in your home to  reduce risk of falls? Yes   ASSISTIVE DEVICES UTILIZED TO PREVENT FALLS:  Life alert? No  Use of a cane, walker or w/c? No  Grab bars in the bathroom? No  Shower chair or bench in shower? No  Elevated toilet seat or a handicapped toilet? No   DME ORDERS:  DME order needed?  No   TIMED UP AND GO:  Was the test performed? No . Telephonic visit.   Education: Fall risk prevention has been discussed.  Intervention(s) required? No   Depression Screen PHQ 2/9 Scores 04/20/2019 03/14/2019 09/01/2018 03/13/2018  PHQ - 2 Score 0 0 0 0  PHQ- 9 Score - 3 0 3     Cognitive Function     6CIT Screen 04/20/2019  What Year? 0 points  What month? 0 points  What time? 0 points  Count back from 20 0 points  Months in reverse 0 points  Repeat phrase 0 points  Total Score 0    Immunization History  Administered Date(s) Administered  . Fluad Quad(high Dose 65+) 10/02/2018  . Influenza Split 10/25/2006, 10/07/2011  . Influenza, Quadrivalent, Recombinant, Inj, Pf 11/11/2017  . Influenza, Seasonal, Injecte, Preservative Fre 10/09/2010  . Influenza-Unspecified 10/23/2013, 11/05/2015, 11/10/2016  . PFIZER SARS-COV-2 Vaccination 03/29/2019, 04/19/2019  . Pneumococcal Conjugate-13  09/24/2015  . Pneumococcal Polysaccharide-23 04/17/2011, 09/01/2018  . Tdap 10/25/2006, 02/10/2017  . Zoster 11/16/2010  . Zoster Recombinat (Shingrix) 10/01/2017, 02/13/2018    Qualifies for Shingles Vaccine? Shingrix series completed.   Tdap: Up to date  Flu Vaccine: Up to date  Pneumococcal Vaccine: Up to date   Screening Tests Health Maintenance  Topic Date Due  . FOOT EXAM  03/03/2019  . URINE MICROALBUMIN  03/03/2019  . HEMOGLOBIN A1C  03/04/2019  . OPHTHALMOLOGY EXAM  04/24/2019  . MAMMOGRAM  09/25/2020  . TETANUS/TDAP  02/11/2027  . COLONOSCOPY  03/04/2027  . INFLUENZA VACCINE  Completed  . DEXA SCAN  Completed  . Hepatitis C Screening  Completed  . PNA vac Low Risk Adult  Completed     Cancer Screenings:  Colorectal Screening: Completed 03/03/17. Repeat every 10 years  Mammogram: Completed 09/26/18. Repeat every year.   Bone Density: Completed 11/18/15. Results reflect NORMAL. Repeat every 2 years. Pt declines repeat screening at this time.   Lung Cancer Screening: (Low Dose CT Chest recommended if Age 19-80 years, 30 pack-year currently smoking OR have quit w/in 15years.) does not qualify.   Additional Screening:  Hepatitis C Screening: does qualify; Completed 07/14/16  Vision Screening: Recommended annual ophthalmology exams for early detection of glaucoma and other disorders of the eye. Is the patient up to date with their annual eye exam?  Yes  Who is the provider or what is the name of the office in which the pt attends annual eye exams? Dr. Luan Pulling  Dental Screening: Recommended annual dental exams for proper oral hygiene  Community Resource Referral:  CRR required this visit?  No      Plan:     I have personally reviewed and addressed the Medicare Annual Wellness questionnaire and have noted the following in the patient's chart:  A. Medical and social history B. Use of alcohol, tobacco or illicit drugs  C. Current medications and supplements D. Functional ability and status E.  Nutritional status F.  Physical activity G. Advance directives H. List of other physicians I.  Hospitalizations, surgeries, and ER visits in previous 12 months J.  [place] such as hearing and vision if needed, cognitive and depression L. Referrals and appointments   In addition, I have reviewed and discussed with patient certain preventive protocols, quality metrics, and best practice recommendations. A written personalized care plan for preventive services as well as general preventive health recommendations were provided to patient.   Signed,  Clemetine Marker, LPN Nurse Health Advisor   Nurse Notes: none

## 2019-04-20 NOTE — Patient Instructions (Signed)
Sonya Lopez , Thank you for taking time to come for your Medicare Wellness Visit. I appreciate your ongoing commitment to your health goals. Please review the following plan we discussed and let me know if I can assist you in the future.   Screening recommendations/referrals: Colonoscopy: done 03/03/17 Mammogram: done 09/26/18 Bone Density: done 11/18/15 Recommended yearly ophthalmology/optometry visit for glaucoma screening and checkup Recommended yearly dental visit for hygiene and checkup  Vaccinations: Influenza vaccine: done 10/02/18 Pneumococcal vaccine: done 09/01/18 Tdap vaccine: done 02/10/17 Shingles vaccine: completed 02/13/18 Covid-19: done 03/29/19 & 04/19/19  Conditions/risks identified: Recommend healthy eating and physical activity for desired weight loss  Next appointment: Please follow up in one year for your Medicare Annual Wellness visit.     Preventive Care 66 Years and Older, Female Preventive care refers to lifestyle choices and visits with your health care provider that can promote health and wellness. What does preventive care include?  A yearly physical exam. This is also called an annual well check.  Dental exams once or twice a year.  Routine eye exams. Ask your health care provider how often you should have your eyes checked.  Personal lifestyle choices, including:  Daily care of your teeth and gums.  Regular physical activity.  Eating a healthy diet.  Avoiding tobacco and drug use.  Limiting alcohol use.  Practicing safe sex.  Taking low-dose aspirin every day.  Taking vitamin and mineral supplements as recommended by your health care provider. What happens during an annual well check? The services and screenings done by your health care provider during your annual well check will depend on your age, overall health, lifestyle risk factors, and family history of disease. Counseling  Your health care provider may ask you questions about  your:  Alcohol use.  Tobacco use.  Drug use.  Emotional well-being.  Home and relationship well-being.  Sexual activity.  Eating habits.  History of falls.  Memory and ability to understand (cognition).  Work and work Statistician.  Reproductive health. Screening  You may have the following tests or measurements:  Height, weight, and BMI.  Blood pressure.  Lipid and cholesterol levels. These may be checked every 5 years, or more frequently if you are over 66 years old.  Skin check.  Lung cancer screening. You may have this screening every year starting at age 66 if you have a 30-pack-year history of smoking and currently smoke or have quit within the past 15 years.  Fecal occult blood test (FOBT) of the stool. You may have this test every year starting at age 66.  Flexible sigmoidoscopy or colonoscopy. You may have a sigmoidoscopy every 5 years or a colonoscopy every 10 years starting at age 66.  Hepatitis C blood test.  Hepatitis B blood test.  Sexually transmitted disease (STD) testing.  Diabetes screening. This is done by checking your blood sugar (glucose) after you have not eaten for a while (fasting). You may have this done every 1-3 years.  Bone density scan. This is done to screen for osteoporosis. You may have this done starting at age 66.  Mammogram. This may be done every 1-2 years. Talk to your health care provider about how often you should have regular mammograms. Talk with your health care provider about your test results, treatment options, and if necessary, the need for more tests. Vaccines  Your health care provider may recommend certain vaccines, such as:  Influenza vaccine. This is recommended every year.  Tetanus, diphtheria, and acellular pertussis (Tdap,  Td) vaccine. You may need a Td booster every 10 years.  Zoster vaccine. You may need this after age 66.  Pneumococcal 13-valent conjugate (PCV13) vaccine. One dose is recommended  after age 66.  Pneumococcal polysaccharide (PPSV23) vaccine. One dose is recommended after age 66. Talk to your health care provider about which screenings and vaccines you need and how often you need them. This information is not intended to replace advice given to you by your health care provider. Make sure you discuss any questions you have with your health care provider. Document Released: 02/14/2015 Document Revised: 10/08/2015 Document Reviewed: 11/19/2014 Elsevier Interactive Patient Education  2017 [place] Prevention in the Home Falls can cause injuries. They can happen to people of all ages. There are many things you can do to make your home safe and to help prevent falls. What can I do on the outside of my home?  Regularly fix the edges of walkways and driveways and fix any cracks.  Remove anything that might make you trip as you walk through a door, such as a raised step or threshold.  Trim any bushes or trees on the path to your home.  Use bright outdoor lighting.  Clear any walking paths of anything that might make someone trip, such as rocks or tools.  Regularly check to see if handrails are loose or broken. Make sure that both sides of any steps have handrails.  Any raised decks and porches should have guardrails on the edges.  Have any leaves, snow, or ice cleared regularly.  Use sand or salt on walking paths during winter.  Clean up any spills in your garage right away. This includes oil or grease spills. What can I do in the bathroom?  Use night lights.  Install grab bars by the toilet and in the tub and shower. Do not use towel bars as grab bars.  Use non-skid mats or decals in the tub or shower.  If you need to sit down in the shower, use a plastic, non-slip stool.  Keep the floor dry. Clean up any water that spills on the floor as soon as it happens.  Remove soap buildup in the tub or shower regularly.  Attach bath mats securely with  double-sided non-slip rug tape.  Do not have throw rugs and other things on the floor that can make you trip. What can I do in the bedroom?  Use night lights.  Make sure that you have a light by your bed that is easy to reach.  Do not use any sheets or blankets that are too big for your bed. They should not hang down onto the floor.  Have a firm chair that has side arms. You can use this for support while you get dressed.  Do not have throw rugs and other things on the floor that can make you trip. What can I do in the kitchen?  Clean up any spills right away.  Avoid walking on wet floors.  Keep items that you use a lot in easy-to-reach places.  If you need to reach something above you, use a strong step stool that has a grab bar.  Keep electrical cords out of the way.  Do not use floor polish or wax that makes floors slippery. If you must use wax, use non-skid floor wax.  Do not have throw rugs and other things on the floor that can make you trip. What can I do with my stairs?  Do not  leave any items on the stairs.  Make sure that there are handrails on both sides of the stairs and use them. Fix handrails that are broken or loose. Make sure that handrails are as long as the stairways.  Check any carpeting to make sure that it is firmly attached to the stairs. Fix any carpet that is loose or worn.  Avoid having throw rugs at the top or bottom of the stairs. If you do have throw rugs, attach them to the floor with carpet tape.  Make sure that you have a light switch at the top of the stairs and the bottom of the stairs. If you do not have them, ask someone to add them for you. What else can I do to help prevent falls?  Wear shoes that:  Do not have high heels.  Have rubber bottoms.  Are comfortable and fit you well.  Are closed at the toe. Do not wear sandals.  If you use a stepladder:  Make sure that it is fully opened. Do not climb a closed stepladder.  Make  sure that both sides of the stepladder are locked into place.  Ask someone to hold it for you, if possible.  Clearly mark and make sure that you can see:  Any grab bars or handrails.  First and last steps.  Where the edge of each step is.  Use tools that help you move around (mobility aids) if they are needed. These include:  Canes.  Walkers.  Scooters.  Crutches.  Turn on the lights when you go into a dark area. Replace any light bulbs as soon as they burn out.  Set up your furniture so you have a clear path. Avoid moving your furniture around.  If any of your floors are uneven, fix them.  If there are any pets around you, be aware of where they are.  Review your medicines with your doctor. Some medicines can make you feel dizzy. This can increase your chance of falling. Ask your doctor what other things that you can do to help prevent falls. This information is not intended to replace advice given to you by your health care provider. Make sure you discuss any questions you have with your health care provider. Document Released: 11/14/2008 Document Revised: 06/26/2015 Document Reviewed: 02/22/2014 Elsevier Interactive Patient Education  2017 [employer].

## 2019-05-04 ENCOUNTER — Other Ambulatory Visit: Payer: Self-pay | Admitting: Cardiovascular Disease

## 2019-05-04 NOTE — Telephone Encounter (Signed)
Pt last saw Roderic Palau, NP on 03/29/19, last labs 03/29/19 Creat 0.92, age 64, weight 98kg, based on specified criteria pt is on appropriate dosage of Eliquis 5mg  BID.  Will refill rx.

## 2019-05-07 ENCOUNTER — Other Ambulatory Visit: Payer: Self-pay | Admitting: Cardiovascular Disease

## 2019-05-07 ENCOUNTER — Other Ambulatory Visit: Payer: Self-pay | Admitting: Family Medicine

## 2019-05-07 DIAGNOSIS — I4892 Unspecified atrial flutter: Secondary | ICD-10-CM

## 2019-05-07 DIAGNOSIS — Z0000 Encounter for general adult medical examination without abnormal findings: Secondary | ICD-10-CM

## 2019-05-07 DIAGNOSIS — Z1231 Encounter for screening mammogram for malignant neoplasm of breast: Secondary | ICD-10-CM

## 2019-05-07 DIAGNOSIS — E2839 Other primary ovarian failure: Secondary | ICD-10-CM

## 2019-06-04 ENCOUNTER — Other Ambulatory Visit: Payer: Self-pay | Admitting: Family Medicine

## 2019-06-04 DIAGNOSIS — M797 Fibromyalgia: Secondary | ICD-10-CM

## 2019-06-04 NOTE — Telephone Encounter (Signed)
Requested Prescriptions  Pending Prescriptions Disp Refills  . gabapentin (NEURONTIN) 100 MG capsule [Pharmacy Med Name: GABAPENTIN 100 MG CAP] 180 capsule 0    Sig: TAKE 1 CAPSULE BY MOUTH TWICE DAILY     Neurology: Anticonvulsants - gabapentin Passed - 06/04/2019 10:59 AM      Passed - Valid encounter within last 12 months    Recent Outpatient Visits          2 months ago OSA (obstructive sleep apnea)   [facility] Steele Sizer, MD   9 months ago Mild major depression [facility])   [facility] Steele Sizer, MD   1 year ago Welcome to [facility] preventive visit   [facility] Steele Sizer, MD   1 year ago Diet-controlled type 2 diabetes mellitus [facility])   [facility] Steele Sizer, MD   1 year ago Well woman exam   [facility] Steele Sizer, MD      Future Appointments            Tomorrow [place], Ocie Doyne, MD [facility], [facility]   In 1 week Gollan, Kathlene November, MD [facility], [place]   In 3 months Steele Sizer, MD [facility], [place]   In 10 months  [facility], [facility]

## 2019-06-05 ENCOUNTER — Encounter: Payer: Self-pay | Admitting: Cardiology

## 2019-06-05 ENCOUNTER — Other Ambulatory Visit: Payer: Self-pay

## 2019-06-05 ENCOUNTER — Ambulatory Visit (INDEPENDENT_AMBULATORY_CARE_PROVIDER_SITE_OTHER): Payer: Medicare Other | Admitting: Cardiology

## 2019-06-05 VITALS — BP 126/72 | HR 51 | Ht 66.0 in | Wt 217.0 lb

## 2019-06-05 DIAGNOSIS — I480 Paroxysmal atrial fibrillation: Secondary | ICD-10-CM

## 2019-06-05 NOTE — Progress Notes (Signed)
Electrophysiology Office Note   Date:  06/05/2019   ID:  Sonya Lopez, DOB 27-Nov-1955, MRN [id number]  PCP:  Steele Sizer, MD  Cardiologist:  Rockey Situ Primary Electrophysiologist:  Vickye Astorino Meredith Leeds, MD    Chief Complaint: AF   History of Present Illness: Sonya Lopez is a 64 y.o. female who is being seen today for the evaluation of AF at the request of Steele Sizer, MD. Presenting today for electrophysiology evaluation.  She has a history of diabetes, GERD, and atrial flutter.  She also has a history of atrial fibrillation as noted by her wearable monitor.  Also has sleep apnea with CPAP.  Noticed in 2013 when she presented to the hospital.  She unfortunately continues to have episodes of atrial fibrillation.  She did go to the emergency room April 2020 with atrial fibrillation.  At that point, she stood up out of bed and had an episode of syncope.  She was taking both propranolol and flecainide, but had not yet taken them that morning.  EMS was called and she was found to be orthostatic and hypotensive.  She received IV fluids and had a cardioversion.  She was switched to amiodarone after this.  Since that time, she is continued to have episodes of atrial fibrillation as documented by her wearable device.  She is now status post AF ablation 03/01/2019.  Today, denies symptoms of palpitations, chest pain, shortness of breath, orthopnea, PND, lower extremity edema, claudication, dizziness, presyncope, syncope, bleeding, or neurologic sequela. The patient is tolerating medications without difficulties.  Overall she is doing well.  She has no chest pain or shortness of breath.  She has had one further episode of atrial fibrillation that lasted a few hours.  This occurred March.  She has had no further episodes and feels well.   Past Medical History:  Diagnosis Date  . Allergy   . Anxiety    situational anxiety  . Arrhythmia    A-fib/A-flutter  . Atrial flutter ([place])   .  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  MammoSite  . Cancer [facility]) 2017   Left- DCIS  . Depression   . Diet-controlled type 2 diabetes mellitus ([place])   . Difficult intubation 2009   [place] had to use  'boogee"  . Female cystocele 01/05/2011  . Fibromyalgia   . GERD (gastroesophageal reflux disease)   . Headache(784.0)   . Kidney stones   . Obesity   . OSA (obstructive sleep apnea) 03/27/2019  . Personal history of radiation therapy 2017   LEFT lumpectomy  . PONV (postoperative nausea and vomiting)    h/o difficult intubation and anes had to use "boogee"  . Vitreous detachment of right eye    sees floaters   Past Surgical History:  Procedure Laterality Date  . ABDOMINAL HYSTERECTOMY  2013  . ANTERIOR AND POSTERIOR REPAIR  11/30/2011   Procedure: ANTERIOR (CYSTOCELE) AND POSTERIOR REPAIR (RECTOCELE);  Surgeon: Reece Packer, MD;  Location: [facility];  Service: Urology;  Laterality: N/A;  with cysto  . ATRIAL FIBRILLATION ABLATION N/A 03/01/2019   Procedure: ATRIAL FIBRILLATION ABLATION;  Surgeon: Constance Haw, MD;  Location: [place] CV LAB;  Service: Cardiovascular;  Laterality: N/A;  . BLADDER SUSPENSION  11/30/2011   Procedure: [place] PROCEDURE;  Surgeon: Reece Packer, MD;  Location: [place] [facility];  Service: Urology;  Laterality: N/A;  graft 10x6  . BREAST BIOPSY Right  2007   negative core  . BREAST BIOPSY Left 09/16/2015   DCIS  . BREAST LUMPECTOMY Left 09/30/2015   DCIS clear margins and rad tx/HIGH GRADE DUCTAL CARCINOMA IN SITU, COMEDO TYPE  . BREAST LUMPECTOMY WITH NEEDLE LOCALIZATION Left 09/30/2015   DCIS excision, MammoSite radiation;  Surgeon: Robert Bellow, MD;  Location: [facility];  Service: General;  Laterality: Left;  . CATARACT EXTRACTION Left 01/2019  . CESAREAN SECTION     x 1  . CHOLECYSTECTOMY  2005  . COLONOSCOPY  2009  . CYSTOSCOPY  11/30/2011    Procedure: CYSTOSCOPY;  Surgeon: Reece Packer, MD;  Location: [place] [facility];  Service: Urology;;  . Brigitte Pulse AND CURETTAGE OF UTERUS  05/05/2007  . GALLBLADDER SURGERY  2004  . TONSILLECTOMY  1961  . VAGINAL HYSTERECTOMY  11/30/2011   Procedure: HYSTERECTOMY VAGINAL;  Surgeon: Emily Filbert, MD;  Location: [place] [facility];  Service: Gynecology;  Laterality: N/A;     Current Outpatient Medications  Medication Sig Dispense Refill  . acetaminophen (TYLENOL) 650 MG CR tablet Take 1,300 mg by mouth as needed for pain.     Marland Kitchen amiodarone (PACERONE) 200 MG tablet Take 1 tablet (200 mg total) by mouth as directed. Once daily and may take second pill if needed for breakthrough afib (Patient taking differently: Take 200 mg by mouth See admin instructions. Take 1 tablet (200 mg) by mouth once daily at night & may take second pill if needed for breakthrough afib) 180 tablet 3  . Cholecalciferol (PA VITAMIN D-3 GUMMY PO) Takes 2 gummies daily    . DULoxetine (CYMBALTA) 20 MG capsule Take 2 capsules (40 mg total) by mouth daily. 180 capsule 1  . ELIQUIS 5 MG TABS tablet TAKE 1 TABLET BY MOUTH TWICE A DAY 180 tablet 1  . FINACEA 15 % FOAM Apply 1 application topically daily as needed (rosacea).     . Fremanezumab-vfrm (AJOVY) 225 MG/1.5ML SOSY Inject 225 mg into the skin every 30 (thirty) days. 6 Syringe 0  . gabapentin (NEURONTIN) 100 MG capsule TAKE 1 CAPSULE BY MOUTH TWICE DAILY 180 capsule 0  . magnesium oxide (MAG-OX) 400 MG tablet Take 400 mg by mouth daily. In the morning    . metoprolol succinate (TOPROL-XL) 25 MG 24 hr tablet TAKE ONE TABLET EVERY DAY 90 tablet 0  . naratriptan (AMERGE) 2.5 MG tablet Take 1 tablet (2.5 mg total) by mouth See admin instructions. ONSET OF HEADACHE. MAY REPEAT AFTER 4 HOURSIF RETURNS OR DOES NOT RESOLVE MAX OF 2 TABS PER DAY.                  Dose:    2.5 mg 9 tablet 5  . omeprazole (PRILOSEC) 20 MG capsule Take 1 tablet by mouth twice a day for 1 month then reduce back  to 1 tablet daily 120 capsule 1  . Probiotic Product (PROBIOTIC PO) Take 1 capsule by mouth daily.     . propranolol (INDERAL) 10 MG tablet TAKE ONE TABLET EVERY DAY WHEN TAKING EXTRA FLECAINIDE FOR BREAKTHROUGH A-FIB (Patient taking differently: Take 10 mg by mouth daily as needed (BREAKTHROUGH A-FIB). ) 30 tablet 1  . traZODone (DESYREL) 50 MG tablet Take 0.5-1 tablets (25-50 mg total) by mouth at bedtime as needed for sleep. 30 tablet 0  . vitamin B-12 (CYANOCOBALAMIN) 1000 MCG tablet Take 1,000 mcg by mouth every 7 (seven) days.     No current facility-administered medications for this visit.    Allergies:  Adhesive [tape], Macrodantin [nitrofurantoin], Metronidazole, Nitrofurantoin macrocrystal, Sulfamethoxazole-trimethoprim, Cephalexin, Clindamycin hcl, Doxycycline, and Erythromycin   Social History:  The patient  reports that she has never smoked. She has never used smokeless tobacco. She reports previous alcohol use. She reports that she does not use drugs.   Family History:  [name] family history includes Asthma in her daughter, daughter, and daughter; Colitis in her daughter; Diabetes in her brother and mother; Fibromyalgia in her daughter; Hearing loss in her mother; Heart disease in her father and paternal grandfather; Hypertension in her brother and mother; Interstitial cystitis in her daughter; Migraines in her daughter; Thyroid cancer (age of onset: 37) in her daughter.    ROS:  Please see the history of present illness.   Otherwise, review of systems is positive for none.   All other systems are reviewed and negative.   PHYSICAL EXAM: VS:  BP 126/72   Pulse (!) 51   Ht 5\' 6"  (1.676 m)   Wt 217 lb (98.4 kg)   BMI 35.02 kg/m  , BMI Body mass index is 35.02 kg/m. GEN: Well nourished, well developed, in no acute distress  HEENT: normal  Neck: no JVD, carotid bruits, or masses Cardiac: RRR; no murmurs, rubs, or gallops,no edema  Respiratory:  clear to auscultation  bilaterally, normal work of breathing GI: soft, nontender, nondistended, + BS MS: no deformity or atrophy  Skin: warm and dry Neuro:  Strength and sensation are intact Psych: euthymic mood, full affect  EKG:  EKG is ordered today. Personal review of the ekg ordered shows sinus rhythm, rate 51  Recent Labs: 02/20/2019: Hemoglobin 13.2; Platelets 287 03/29/2019: ALT 20; BUN 20; Creatinine, Ser 0.92; Potassium 4.4; Sodium 141; TSH 2.958    Lipid Panel     Component Value Date/Time   CHOL 186 03/02/2018 1000   CHOL 209 (H) 06/04/2015 0753   CHOL 164 04/17/2011 0507   TRIG 87 03/02/2018 1000   TRIG 83 04/17/2011 0507   HDL 60 03/02/2018 1000   HDL 58 06/04/2015 0753   HDL 43 04/17/2011 0507   CHOLHDL 3.1 03/02/2018 1000   VLDL 15 01/05/2016 0828   VLDL 17 04/17/2011 0507   LDLCALC 108 (H) 03/02/2018 1000   LDLCALC 104 (H) 04/17/2011 0507     Wt Readings from Last 3 Encounters:  06/05/19 217 lb (98.4 kg)  04/20/19 216 lb (98 kg)  03/29/19 215 lb 12.8 oz (97.9 kg)      Other studies Reviewed: Additional studies/ records that were reviewed today include: ETT 09/29/17 personally reviewed  Review of the above records today demonstrates:  Normal exercise treadmill study Good exercise tolerance Target heart rate achieved Appropriate recovery after 3 minutes   ASSESSMENT AND PLAN:  1.  Paroxysmal atrial fibrillation/atrial flutter: Status post ablation 03/01/2019.  Currently on Eliquis.  CHA2DS2-VASc of at least 2.  She has had one episode of atrial fibrillation but no further.  This occurred in March.  We Tola Meas thus plan to stop amiodarone.  2.  Hyperlipidemia: Weight loss encouraged.  Plan per primary cardiology.  3.  Obstructive sleep apnea: CPAP compliance encouraged  Current medicines are reviewed at length with the patient today.   The patient does not have concerns regarding her medicines.  The following changes were made today: Stop amiodarone  Labs/ tests ordered  today include:  Orders Placed This Encounter  Procedures  . EKG 12-Lead    Disposition:   FU with Dakari Cregger 3 months  Signed, Ginnifer Creelman [employer]  Yukari Flax, MD  06/05/2019 10:31 AM     [facility] [address] [place] [address] [phone] (office) [phone] (fax)

## 2019-06-05 NOTE — Patient Instructions (Signed)
Medication Instructions:  Your physician recommends that you continue on your current medications as directed. Please refer to the Current Medication list given to you today.  *If you need a refill on your cardiac medications before your next appointment, please call your pharmacy*   Lab Work: None ordered If you have labs (blood work) drawn today and your tests are completely normal, you will receive your results only by: . MyChart Message (if you have MyChart) OR . A paper copy in the mail If you have any lab test that is abnormal or we need to change your treatment, we will call you to review the results.   Testing/Procedures: None ordered   Follow-Up: At [facility], you and your health needs are our priority.  As part of our continuing mission to provide you with exceptional heart care, we have created designated Provider Care Teams.  These Care Teams include your primary Cardiologist (physician) and Advanced Practice Providers (APPs -  Physician Assistants and Nurse Practitioners) who all work together to provide you with the care you need, when you need it.  We recommend signing up for the patient portal called "MyChart".  Sign up information is provided on this After Visit Summary.  MyChart is used to connect with patients for Virtual Visits (Telemedicine).  Patients are able to view lab/test results, encounter notes, upcoming appointments, etc.  Non-urgent messages can be sent to your provider as well.   To learn more about what you can do with MyChart, go to [email or web address].    Your next appointment:   3 month(s)  The format for your next appointment:   In Person  Provider:   Will Camnitz, MD   Thank you for choosing [facility]!!   Zakeya Junker, RN [phone]    Other Instructions    

## 2019-06-11 DIAGNOSIS — H02831 Dermatochalasis of right upper eyelid: Secondary | ICD-10-CM | POA: Diagnosis not present

## 2019-06-11 DIAGNOSIS — H02834 Dermatochalasis of left upper eyelid: Secondary | ICD-10-CM | POA: Diagnosis not present

## 2019-06-11 DIAGNOSIS — H25811 Combined forms of age-related cataract, right eye: Secondary | ICD-10-CM | POA: Diagnosis not present

## 2019-06-11 NOTE — Progress Notes (Signed)
Date:  06/12/2019   ID:  Sonya Lopez, DOB Jul 12, 1955, MRN [id number]  Patient Location:  [place] [address]   Provider location:   [facility], [place] office  PCP:  Steele Sizer, MD  Cardiologist:  Patsy Baltimore  Chief Complaint  Patient presents with  . OTHER    6 month f/u no complaints today. Meds reviewed verbally with pt.    History of Present Illness:    Sonya Lopez is a 64 y.o. female  past medical history of Diabetes  GERD hospital admission in 04/16/2011 for atrial flutter with RVR.  CT coronary calcium score zero 09/2015 Father had significant history of coronary artery disease but was a heavy smoker She presents for routine followup of her atrial fibrillation and flutter Ablation 02/2019  Underwent ablation at the end of January 2021 Has done very well since that time Amiodarone weaned off, now taking metoprolol succinate 25 daily  Continues on eliquis  5 mg  bid for CHA2DS2VASc of 2.   Denies any tachycardia concerning for arrhythmia past several months   has a watch and able to print up the telemetry strips clearly documenting atrial fibrillation  Some fatigue, heart rate slow, rate low 50s at home  EKG personally reviewed by myself on todays visit Shows sinus bradycardia rate 55 bpm no significant ST-T wave changes    Prior CV studies:   The following studies were reviewed today:   Past Medical History:  Diagnosis Date  . Allergy   . Anxiety    situational anxiety  . Arrhythmia    A-fib/A-flutter  . Atrial flutter ([place])   . 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  MammoSite  . Cancer [facility]) 2017   Left- DCIS  . Depression   . Diet-controlled type 2 diabetes mellitus ([place])   . Difficult intubation 2009   [place] had to use  'boogee"  . Female cystocele 01/05/2011  . Fibromyalgia   . GERD  (gastroesophageal reflux disease)   . Headache(784.0)   . Kidney stones   . Obesity   . OSA (obstructive sleep apnea) 03/27/2019  . Personal history of radiation therapy 2017   LEFT lumpectomy  . PONV (postoperative nausea and vomiting)    h/o difficult intubation and anes had to use "boogee"  . Vitreous detachment of right eye    sees floaters   Past Surgical History:  Procedure Laterality Date  . ABDOMINAL HYSTERECTOMY  2013  . ANTERIOR AND POSTERIOR REPAIR  11/30/2011   Procedure: ANTERIOR (CYSTOCELE) AND POSTERIOR REPAIR (RECTOCELE);  Surgeon: Reece Packer, MD;  Location: [facility] [facility];  Service: Urology;  Laterality: N/A;  with cysto  . ATRIAL FIBRILLATION ABLATION N/A 03/01/2019   Procedure: ATRIAL FIBRILLATION ABLATION;  Surgeon: Constance Haw, MD;  Location: [place] CV LAB;  Service: Cardiovascular;  Laterality: N/A;  . BLADDER SUSPENSION  11/30/2011   Procedure: [place] PROCEDURE;  Surgeon: Reece Packer, MD;  Location: [place] [facility];  Service: Urology;  Laterality: N/A;  graft 10x6  . BREAST BIOPSY Right 2007   negative core  . BREAST BIOPSY Left 09/16/2015   DCIS  . BREAST LUMPECTOMY Left 09/30/2015   DCIS clear margins and rad tx/HIGH GRADE DUCTAL CARCINOMA IN SITU, COMEDO TYPE  . BREAST LUMPECTOMY WITH NEEDLE LOCALIZATION Left 09/30/2015   DCIS excision, MammoSite radiation;  Surgeon: Robert Bellow, MD;  Location: [facility];  Service: General;  Laterality: Left;  . CATARACT EXTRACTION Left 01/2019  . CESAREAN SECTION     x 1  . CHOLECYSTECTOMY  2005  . COLONOSCOPY  2009  . CYSTOSCOPY  11/30/2011   Procedure: CYSTOSCOPY;  Surgeon: Reece Packer, MD;  Location: [facility];  Service: Urology;;  . Brigitte Pulse AND CURETTAGE OF UTERUS  05/05/2007  . GALLBLADDER SURGERY  2004  . TONSILLECTOMY  1961  . VAGINAL HYSTERECTOMY  11/30/2011   Procedure: HYSTERECTOMY VAGINAL;  Surgeon: Emily Filbert, MD;  Location: [place] [facility];  Service: Gynecology;  Laterality: N/A;     Current  Meds  Medication Sig  . acetaminophen (TYLENOL) 650 MG CR tablet Take 1,300 mg by mouth as needed for pain.   Marland Kitchen apixaban (ELIQUIS) 5 MG TABS tablet Take 1 tablet (5 mg total) by mouth 2 (two) times daily.  . Cholecalciferol (PA VITAMIN D-3 GUMMY PO) Takes 2 gummies daily  . DULoxetine (CYMBALTA) 20 MG capsule Take 2 capsules (40 mg total) by mouth daily.  Marland Kitchen FINACEA 15 % FOAM Apply 1 application topically daily as needed (rosacea).   . Fremanezumab-vfrm (AJOVY) 225 MG/1.5ML SOSY Inject 225 mg into the skin every 30 (thirty) days.  Marland Kitchen gabapentin (NEURONTIN) 100 MG capsule TAKE 1 CAPSULE BY MOUTH TWICE DAILY  . magnesium oxide (MAG-OX) 400 MG tablet Take 400 mg by mouth daily. In the morning  . metoprolol succinate (TOPROL-XL) 25 MG 24 hr tablet TAKE ONE TABLET EVERY DAY  . naratriptan (AMERGE) 2.5 MG tablet Take 1 tablet (2.5 mg total) by mouth See admin instructions. ONSET OF HEADACHE. MAY REPEAT AFTER 4 HOURSIF RETURNS OR DOES NOT RESOLVE MAX OF 2 TABS PER DAY.                  Dose:    2.5 mg  . omeprazole (PRILOSEC) 20 MG capsule Take 1 tablet by mouth twice a day for 1 month then reduce back to 1 tablet daily  . Probiotic Product (PROBIOTIC PO) Take 1 capsule by mouth daily.   . propranolol (INDERAL) 10 MG tablet TAKE ONE TABLET EVERY DAY WHEN TAKING EXTRA FLECAINIDE FOR BREAKTHROUGH A-FIB (Patient taking differently: Take 10 mg by mouth daily as needed (BREAKTHROUGH A-FIB). )  . traZODone (DESYREL) 50 MG tablet Take 0.5-1 tablets (25-50 mg total) by mouth at bedtime as needed for sleep.  . vitamin B-12 (CYANOCOBALAMIN) 1000 MCG tablet Take 1,000 mcg by mouth every 7 (seven) days.  . [DISCONTINUED] ELIQUIS 5 MG TABS tablet TAKE 1 TABLET BY MOUTH TWICE A DAY     Allergies:   Adhesive [tape], Macrodantin [nitrofurantoin], Metronidazole, Nitrofurantoin macrocrystal, Sulfamethoxazole-trimethoprim, Cephalexin, Clindamycin hcl, Doxycycline, and Erythromycin   Social History    Tobacco Use  . Smoking status: Never Smoker  . Smokeless tobacco: Never Used  Substance Use Topics  . Alcohol use: Not Currently    Alcohol/week: 0.0 standard drinks    Comment: Due to AF  . Drug use: No      Family Hx: [name] family history includes Asthma in her daughter, daughter, and daughter; Colitis in her daughter; Diabetes in her brother and mother; Fibromyalgia in her daughter; Hearing loss in her mother; Heart disease in her father and paternal grandfather; Hypertension in her brother and mother; Interstitial cystitis in her daughter; Migraines in her daughter; Thyroid cancer (age of onset: 36) in her daughter. There is no history of Breast cancer.  ROS:   Please see the history of  present illness.    Review of Systems  Constitutional: Negative.   HENT: Negative.   Respiratory: Negative.   Cardiovascular: Negative.   Gastrointestinal: Negative.   Musculoskeletal: Negative.   Neurological: Negative.   Psychiatric/Behavioral: Negative.   All other systems reviewed and are negative.    Labs/Other Tests and Data Reviewed:    Recent Labs: 02/20/2019: Hemoglobin 13.2; Platelets 287 03/29/2019: ALT 20; BUN 20; Creatinine, Ser 0.92; Potassium 4.4; Sodium 141; TSH 2.958   Recent Lipid Panel Lab Results  Component Value Date/Time   CHOL 186 03/02/2018 10:00 AM   CHOL 209 (H) 06/04/2015 07:53 AM   CHOL 164 04/17/2011 05:07 AM   TRIG 87 03/02/2018 10:00 AM   TRIG 83 04/17/2011 05:07 AM   HDL 60 03/02/2018 10:00 AM   HDL 58 06/04/2015 07:53 AM   HDL 43 04/17/2011 05:07 AM   CHOLHDL 3.1 03/02/2018 10:00 AM   LDLCALC 108 (H) 03/02/2018 10:00 AM   LDLCALC 104 (H) 04/17/2011 05:07 AM    Wt Readings from Last 3 Encounters:  06/12/19 219 lb 2 oz (99.4 kg)  06/05/19 217 lb (98.4 kg)  04/20/19 216 lb (98 kg)     Exam:    BP 120/70 (BP Location: Left Arm, Patient Position: Sitting, Cuff Size: Normal)   Pulse (!) 55   Ht 5\' 6"  (1.676 m)   Wt 219 lb 2 oz (99.4  kg)   SpO2 98%   BMI 35.37 kg/m   Constitutional:  oriented to person, place, and time. No distress.  HENT:  Head: Grossly normal Eyes:  no discharge. No scleral icterus.  Neck: No JVD, no carotid bruits  Cardiovascular: Regular rate and rhythm, no murmurs appreciated Pulmonary/Chest: Clear to auscultation bilaterally, no wheezes or rails Abdominal: Soft.  no distension.  no tenderness.  Musculoskeletal: Normal range of motion Neurological:  normal muscle tone. Coordination normal. No atrophy Skin: Skin warm and dry Psychiatric: normal affect, pleasant  ASSESSMENT & PLAN:    Atrial fibrillation/ flutter, unspecified type (HCC) Underwent ablation maintaining normal sinus rhythm Sinus bradycardia with some fatigue on metoprolol succinate 25 daily Suggested she could try to take 12.5 mg daily She has extra metoprolol and antiarrhythmics for any recurrent arrhythmia  History of syncope Suspect secondary to orthostasis in the past No further episodes, no further work-up  Hyperlipidemia Lifestyle modification recommended  Notes reviewed from electrophysiology,  hospitalization reviewed Detailing ablation  Total encounter time more than 25 minutes  Greater than 50% was spent in counseling and coordination of care with the patient   Disposition: Follow-up in 12 months   Signed, Ida Rogue, MD  06/12/2019 10:48 AM    [place] Office [address], [place], [place] [address]

## 2019-06-12 ENCOUNTER — Other Ambulatory Visit: Payer: Self-pay

## 2019-06-12 ENCOUNTER — Encounter: Payer: Self-pay | Admitting: Cardiovascular Disease

## 2019-06-12 ENCOUNTER — Ambulatory Visit (INDEPENDENT_AMBULATORY_CARE_PROVIDER_SITE_OTHER): Payer: Medicare Other | Admitting: Cardiovascular Disease

## 2019-06-12 VITALS — BP 120/70 | HR 55 | Ht 66.0 in | Wt 219.1 lb

## 2019-06-12 DIAGNOSIS — I480 Paroxysmal atrial fibrillation: Secondary | ICD-10-CM

## 2019-06-12 DIAGNOSIS — I483 Typical atrial flutter: Secondary | ICD-10-CM

## 2019-06-12 MED ORDER — APIXABAN 5 MG PO TABS: 5.0000 mg | ORAL_TABLET | Freq: Two times a day (BID) | ORAL | 2 refills | Status: DC

## 2019-06-12 NOTE — Patient Instructions (Signed)
Medication Instructions:  No changes  Consider cutting the metoprolol in 1/2 daily  If you need a refill on your cardiac medications before your next appointment, please call your pharmacy.    Lab work: No new labs needed   If you have labs (blood work) drawn today and your tests are completely normal, you will receive your results only by: Marland Kitchen MyChart Message (if you have MyChart) OR . A paper copy in the mail If you have any lab test that is abnormal or we need to change your treatment, we will call you to review the results.   Testing/Procedures: No new testing needed   Follow-Up: At [facility], you and your health needs are our priority.  As part of our continuing mission to provide you with exceptional heart care, we have created designated Provider Care Teams.  These Care Teams include your primary Cardiologist (physician) and Advanced Practice Providers (APPs -  Physician Assistants and Nurse Practitioners) who all work together to provide you with the care you need, when you need it.  . You will need a follow up appointment in 12 months   . Providers on your designated Care Team:   . Murray Hodgkins, NP . Christell Faith, PA-C . Marrianne Mood, PA-C  Any Other Special Instructions Will Be Listed Below (If Applicable).  COVID-19 Vaccine Information can be found at: [email or web address].uk For questions related to vaccine distribution or appointments, please email [email or web address] or call [phone].

## 2019-06-14 ENCOUNTER — Other Ambulatory Visit: Payer: Self-pay | Admitting: Neurology

## 2019-06-14 MED ORDER — RIZATRIPTAN BENZOATE 10 MG PO TBDP: 10.0000 mg | ORAL_TABLET | ORAL | 11 refills | Status: DC | PRN

## 2019-06-20 ENCOUNTER — Ambulatory Visit (INDEPENDENT_AMBULATORY_CARE_PROVIDER_SITE_OTHER): Payer: Medicare Other | Admitting: Dermatology

## 2019-06-20 ENCOUNTER — Other Ambulatory Visit: Payer: Self-pay

## 2019-06-20 DIAGNOSIS — L719 Rosacea, unspecified: Secondary | ICD-10-CM

## 2019-06-20 DIAGNOSIS — D229 Melanocytic nevi, unspecified: Secondary | ICD-10-CM

## 2019-06-20 DIAGNOSIS — L814 Other melanin hyperpigmentation: Secondary | ICD-10-CM

## 2019-06-20 DIAGNOSIS — Z1283 Encounter for screening for malignant neoplasm of skin: Secondary | ICD-10-CM | POA: Diagnosis not present

## 2019-06-20 DIAGNOSIS — Z872 Personal history of diseases of the skin and subcutaneous tissue: Secondary | ICD-10-CM | POA: Diagnosis not present

## 2019-06-20 DIAGNOSIS — L738 Other specified follicular disorders: Secondary | ICD-10-CM | POA: Diagnosis not present

## 2019-06-20 DIAGNOSIS — L720 Epidermal cyst: Secondary | ICD-10-CM | POA: Diagnosis not present

## 2019-06-20 DIAGNOSIS — L578 Other skin changes due to chronic exposure to nonionizing radiation: Secondary | ICD-10-CM | POA: Diagnosis not present

## 2019-06-20 DIAGNOSIS — L821 Other seborrheic keratosis: Secondary | ICD-10-CM

## 2019-06-20 DIAGNOSIS — D1801 Hemangioma of skin and subcutaneous tissue: Secondary | ICD-10-CM | POA: Diagnosis not present

## 2019-06-20 NOTE — Patient Instructions (Addendum)
Recommend daily broad spectrum sunscreen SPF 30+ to sun-exposed areas, reapply every 2 hours as needed. Call for new or changing lesions.  Doxycycline should be taken with food to prevent nausea. Do not lay down for 30 minutes after taking. Be cautious with sun exposure and use good sun protection while on this medication. Pregnant women should not take this medication.  I able to tolerate call the office at [phone], option 4 and let us know to send prescription in.   Instructions for [facility] Medications  One or more of your medications was sent to the [facility] mail order compounding pharmacy. You will receive an email from them and can purchase the medicine through that link. It will then be mailed to your home at the address you confirmed. If for any reason you do not receive an email from them, please check your spam folder. If you still do not find the email, please let us know.   Continue The Perfect A and sunscreen daily. May discontinue while rosacea is flared. Once calm may restart and can mix with moisturizer.

## 2019-06-20 NOTE — Progress Notes (Signed)
Follow-Up Visit   Subjective  Sonya Lopez is a 64 y.o. female who presents for the following: Annual Exam.  Patient here today for TBSE. She has a history of AK's and rosacea. She does advise that her rosacea has been flared the last week. She uses Finacea foam and does wear c pap.  The Lawerance Cruel is very expensive. She also has a spot on right side of neck that is new and some on the left side. She also would like to know what type of moisturizer she should use.   The following portions of the chart were reviewed this encounter and updated as appropriate:      Review of Systems:  No other skin or systemic complaints except as noted in HPI or Assessment and Plan.  Objective  Well appearing patient in no apparent distress; mood and affect are within normal limits.  A full examination was performed including scalp, head, eyes, ears, nose, lips, neck, chest, axillae, abdomen, back, buttocks, bilateral upper extremities, bilateral lower extremities, hands, feet, fingers, toes, fingernails, and toenails. All findings within normal limits unless otherwise noted below.  Objective  Right  Neck: 11mm firm flesh papule  Objective  Face: Erythema on cheeks and nose, patient states she will get bumps off an on.  Objective  face: Small yellow papules with a central dell.   Objective  Left Lower Neck: Waxy tiny flesh papules   Assessment & Plan  Epidermal inclusion cyst Right  Neck  Vs Neurofibroma  Reassured benign growth.  Recommend observation.  Discussed surgical excision in office if changes noted or symptomatic.    Rosacea Face  With flare Start doxycycline 40mg  1 PO QD with food and drink #30  Patient had reaction to doxycycline back in the 80's so we will give her samples x 4 to try. If she tolerates, we can send in a prescription.  Lot # [id number] Exp: 09/22 D/C Finacea Start [facility] metronidazole/ivermectin/azelaic acid twice daily as needed to affected  areas on the face. The patient was advised this is not covered by insurance. They will receive an email to check out and the medication will be mailed to their home.   Recommend Elta MD clear tinted daily to face. Decrease use of Perfect A cream to midface until rosacea flare resolved, then restart slowly as directed   Sebaceous hyperplasia face  Benign, observe.    Seborrheic keratosis Left Lower Neck  Reassured benign age-related growth.  Recommend observation.  Discussed cryotherapy if spot(s) become irritated or inflamed.     Lentigines - Scattered tan macules, including right cheek, LN2 in past with recurrence - Discussed due to sun exposure - Benign, observe - Call for any changes Continue Perfect A cream and sunscreen to aas.  Start [facility] Hydroquinone 12%/kojic acid/vitamin C cream pea sized amount twice daily to the entire face for up to 3 months. This cannot be used more than 3 months due to risk of exogenous ochronosis (permanent dark spots). The patient was advised this is not covered by insurance. They will receive an email to check out and the medication will be mailed to their home.   Seborrheic Keratoses - Stuck-on, waxy, tan-brown papules and plaques  - Discussed benign etiology and prognosis. - Observe - Call for any changes  Melanocytic Nevi - Tan-brown and/or pink-flesh-colored symmetric macules and papules - Benign appearing on exam today - Observation - Call clinic for new or changing moles - Recommend daily use of broad spectrum  spf 30+ sunscreen to sun-exposed areas.   Hemangiomas - Red papules - Discussed benign nature - Observe - Call for any changes  Actinic Damage - diffuse scaly erythematous macules with underlying dyspigmentation - Recommend daily broad spectrum sunscreen SPF 30+ to sun-exposed areas, reapply every 2 hours as needed.  - Call for new or changing lesions.  Skin cancer screening performed today.  Return in about  1 year (around 06/19/2020) for TBSE, 3 months for rosacea.  Graciella Belton, RMA, am acting as scribe for Brendolyn Patty, MD .  Documentation: I have reviewed the above documentation for accuracy and completeness, and I agree with the above.  Brendolyn Patty MD

## 2019-06-28 ENCOUNTER — Telehealth: Payer: Self-pay

## 2019-06-28 NOTE — Telephone Encounter (Signed)
Pt called she taking Oracea 40 mg bid, pt would like to know if she can try taking it once day because of slight headaches,  Discussed with pt she should only be taking Oracea 40 mg once a day

## 2019-07-16 ENCOUNTER — Other Ambulatory Visit: Payer: Self-pay | Admitting: Neurology

## 2019-07-16 MED ORDER — ACETAMINOPHEN-CODEINE #3 300-30 MG PO TABS: 1.0000 | ORAL_TABLET | ORAL | 3 refills | Status: DC | PRN

## 2019-07-17 NOTE — Progress Notes (Deleted)
Consent Form Botulism Toxin Injection For Chronic Migraine  07/17/2019: This is our first botox.   Reviewed orally with patient, additionally signature is on file:  Botulism toxin has been approved by the Federal drug administration for treatment of chronic migraine. Botulism toxin does not cure chronic migraine and it may not be effective in some patients.  The administration of botulism toxin is accomplished by injecting a small amount of toxin into the muscles of the neck and head. Dosage must be titrated for each individual. Any benefits resulting from botulism toxin tend to wear off after 3 months with a repeat injection required if benefit is to be maintained. Injections are usually done every 3-4 months with maximum effect peak achieved by about 2 or 3 weeks. Botulism toxin is expensive and you should be sure of what costs you will incur resulting from the injection.  The side effects of botulism toxin use for chronic migraine may include:   -Transient, and usually mild, facial weakness with facial injections  -Transient, and usually mild, head or neck weakness with head/neck injections  -Reduction or loss of forehead facial animation due to forehead muscle weakness  -Eyelid drooping  -Dry eye  -Pain at the site of injection or bruising at the site of injection  -Double vision  -Potential unknown long term risks  Contraindications: You should not have Botox if you are pregnant, nursing, allergic to albumin, have an infection, skin condition, or muscle weakness at the site of the injection, or have myasthenia gravis, Lambert-Eaton syndrome, or ALS.  It is also possible that as with any injection, there may be an allergic reaction or no effect from the medication. Reduced effectiveness after repeated injections is sometimes seen and rarely infection at the injection site may occur. All care will be taken to prevent these side effects. If therapy is given over a long time, atrophy and  wasting in the muscle injected may occur. Occasionally the patient's become refractory to treatment because they develop antibodies to the toxin. In this event, therapy needs to be modified.  I have read the above information and consent to the administration of botulism toxin.    BOTOX PROCEDURE NOTE FOR MIGRAINE HEADACHE    Contraindications and precautions discussed with patient(above). Aseptic procedure was observed and patient tolerated procedure. Procedure performed by Dr. Georgia Dom  The condition has existed for more than 6 months, and pt does not have a diagnosis of ALS, Myasthenia Gravis or Lambert-Eaton Syndrome.  Risks and benefits of injections discussed and pt agrees to proceed with the procedure.  Written consent obtained  These injections are medically necessary. Pt  receives good benefits from these injections. These injections do not cause sedations or hallucinations which the oral therapies may cause.  Description of procedure:  The patient was placed in a sitting position. The standard protocol was used for Botox as follows, with 5 units of Botox injected at each site:   -Procerus muscle, midline injection  -Corrugator muscle, bilateral injection  -Frontalis muscle, bilateral injection, with 2 sites each side, medial injection was performed in the upper one third of the frontalis muscle, in the region vertical from the medial inferior edge of the superior orbital rim. The lateral injection was again in the upper one third of the forehead vertically above the lateral limbus of the cornea, 1.5 cm lateral to the medial injection site.  -Temporalis muscle injection, 4 sites, bilaterally. The first injection was 3 cm above the tragus of the ear, second injection  site was 1.5 cm to 3 cm up from the first injection site in line with the tragus of the ear. The third injection site was 1.5-3 cm forward between the first 2 injection sites. The fourth injection site was 1.5 cm  posterior to the second injection site.   -Occipitalis muscle injection, 3 sites, bilaterally. The first injection was done one half way between the occipital protuberance and the tip of the mastoid process behind the ear. The second injection site was done lateral and superior to the first, 1 fingerbreadth from the first injection. The third injection site was 1 fingerbreadth superiorly and medially from the first injection site.  -Cervical paraspinal muscle injection, 2 sites, bilateral knee first injection site was 1 cm from the midline of the cervical spine, 3 cm inferior to the lower border of the occipital protuberance. The second injection site was 1.5 cm superiorly and laterally to the first injection site.  -Trapezius muscle injection was performed at 3 sites, bilaterally. The first injection site was in the upper trapezius muscle halfway between the inflection point of the neck, and the acromion. The second injection site was one half way between the acromion and the first injection site. The third injection was done between the first injection site and the inflection point of the neck.   Will return for repeat injection in 3 months.   200 units of Botox was used, any Botox not injected was wasted. The patient tolerated the procedure well, there were no complications of the above procedure.

## 2019-07-18 ENCOUNTER — Ambulatory Visit: Payer: Self-pay | Admitting: Neurology

## 2019-07-18 DIAGNOSIS — M542 Cervicalgia: Secondary | ICD-10-CM | POA: Diagnosis not present

## 2019-07-18 DIAGNOSIS — M5410 Radiculopathy, site unspecified: Secondary | ICD-10-CM | POA: Diagnosis not present

## 2019-07-29 IMAGING — CR CHEST - 2 VIEW
1 series · 2 of 2 positions shown · non-contrast
Comparison: 04/16/2011

CLINICAL DATA: Dyspnea

EXAM:
CHEST - 2 VIEW

[Series 1: dg chest 2 view · 0.14mm/px · 2 of 2 slices shown]
[im 1/2]
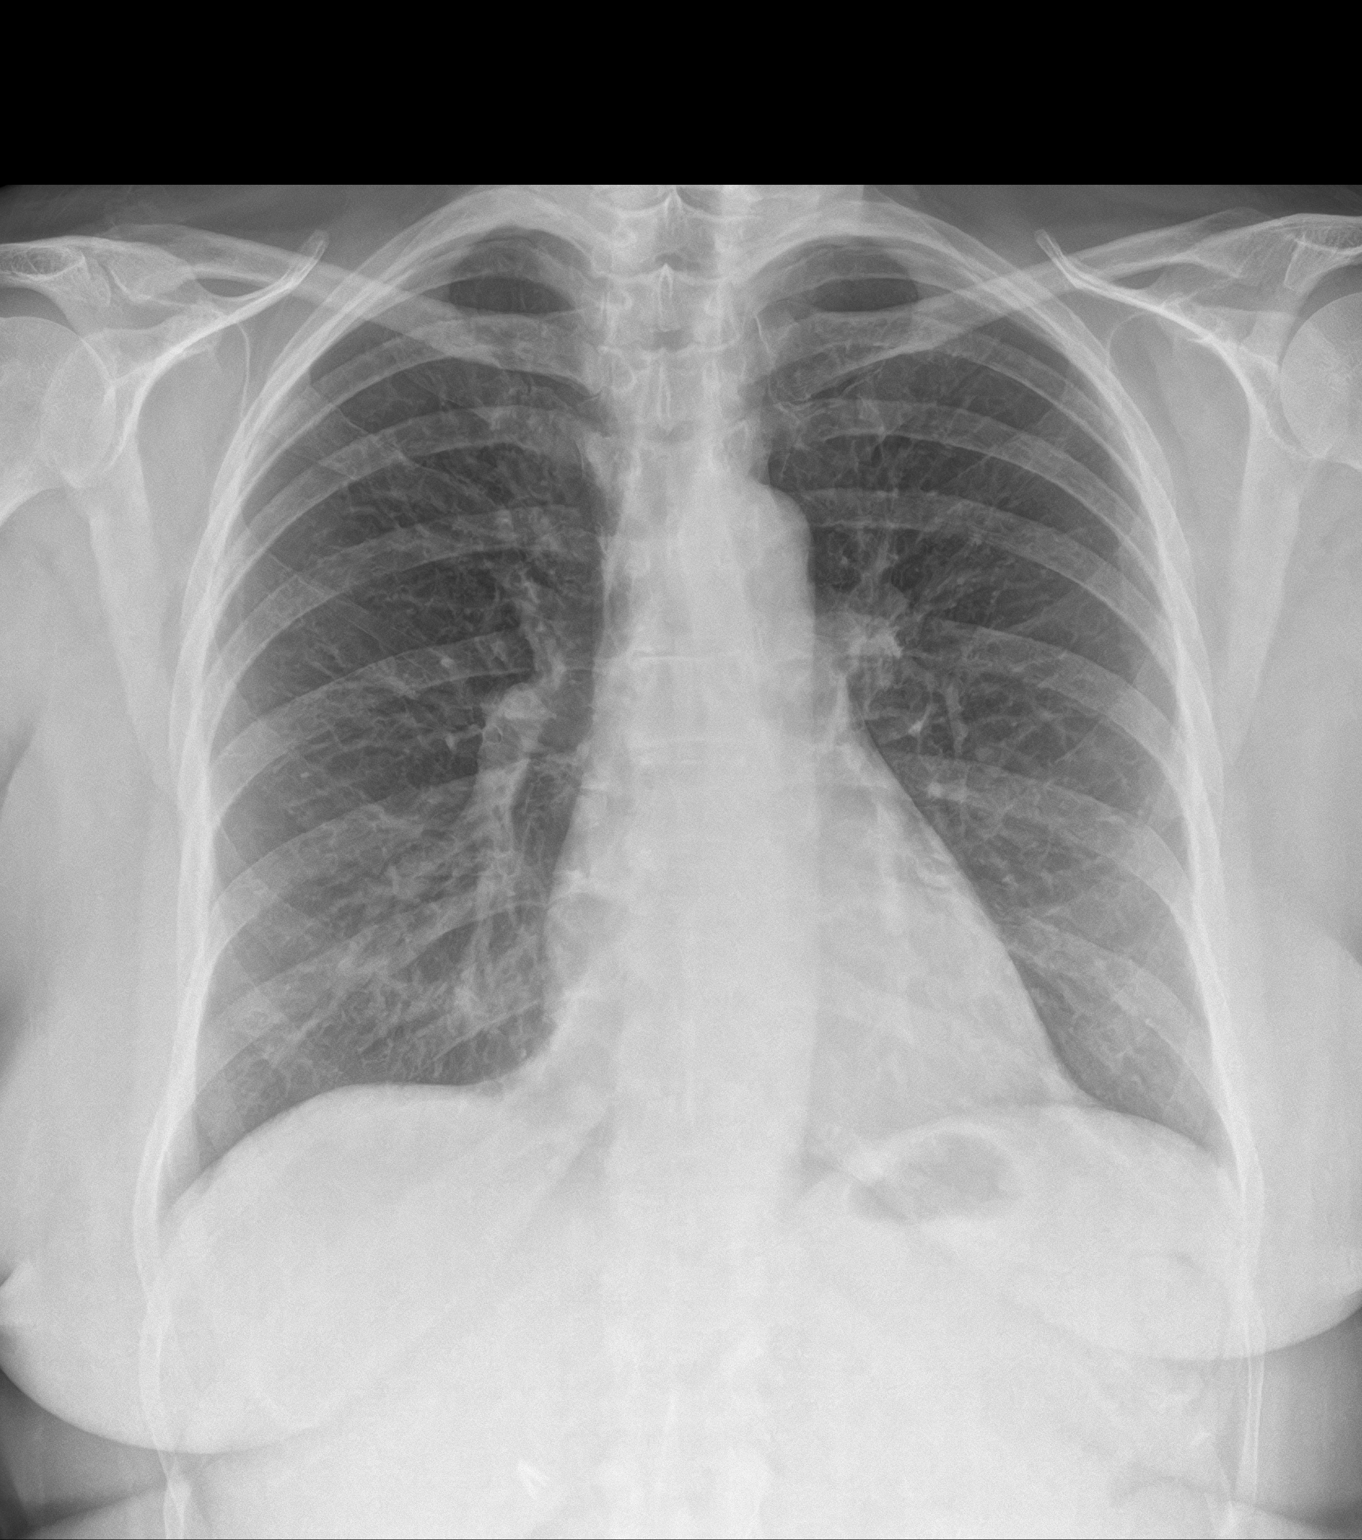
[im 2/2]
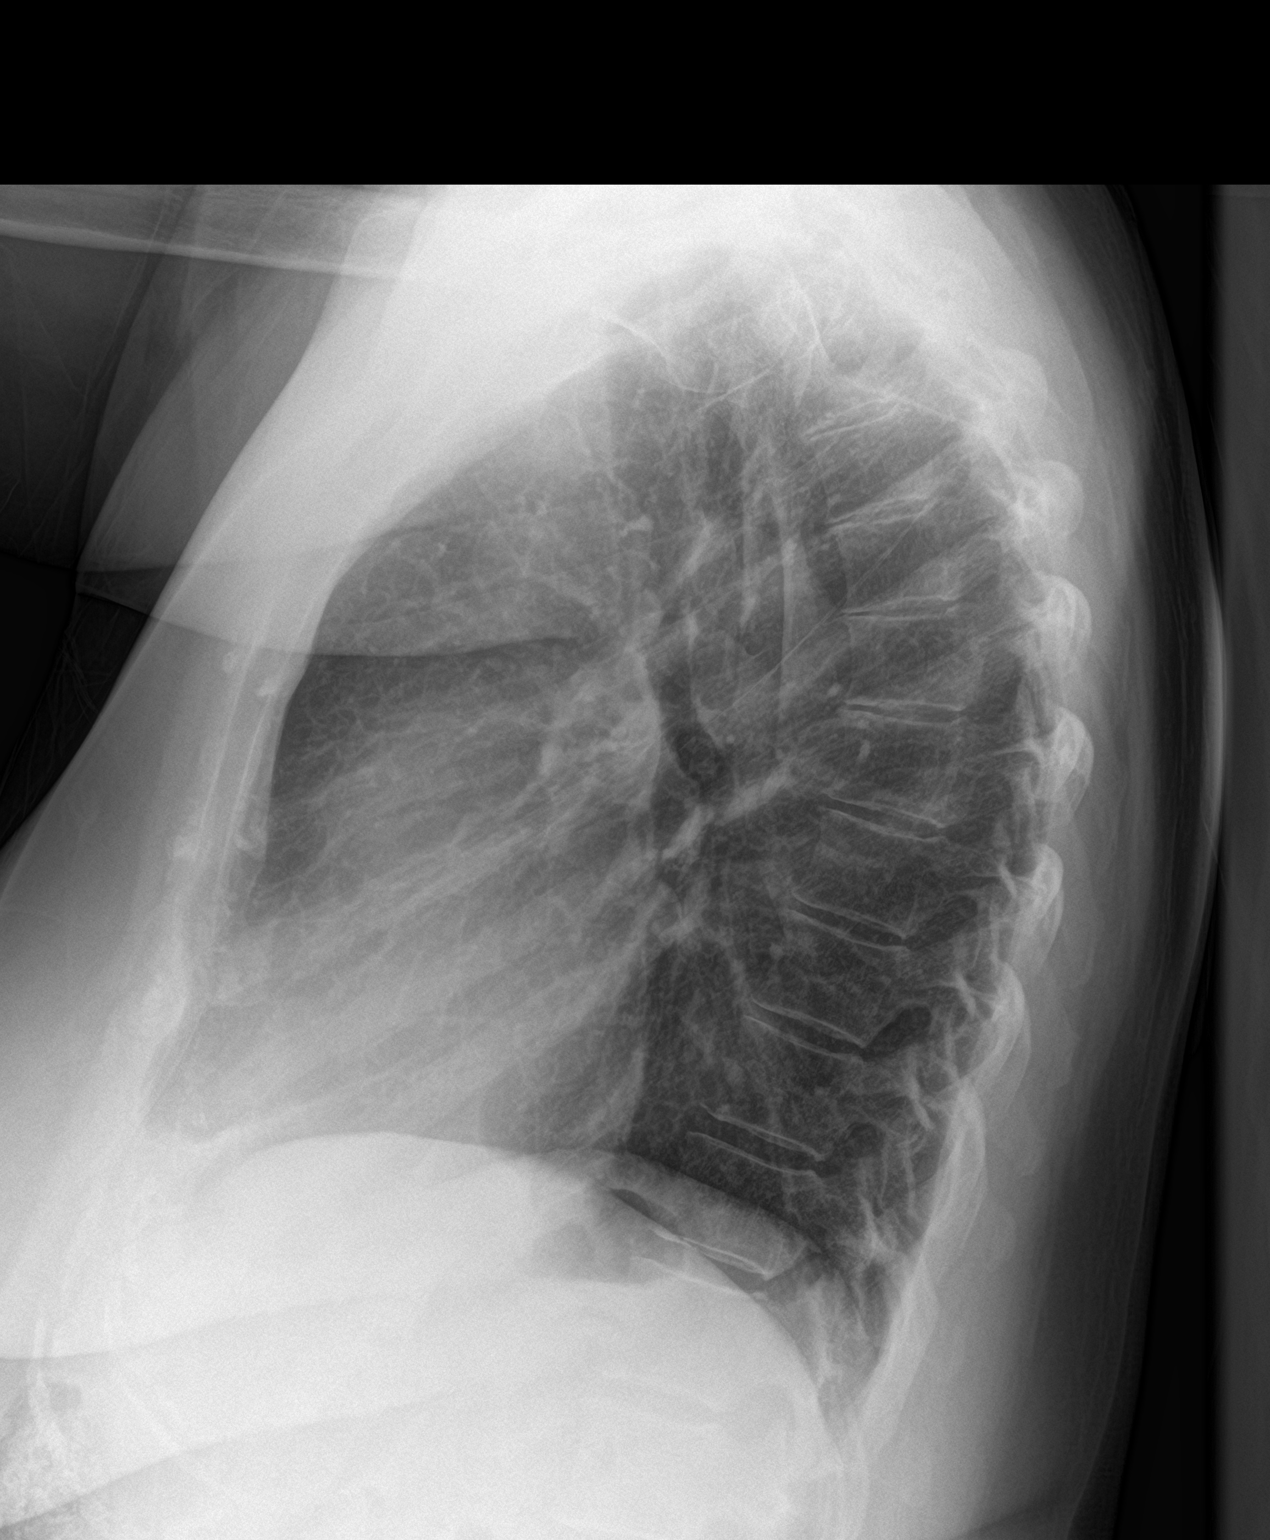

[2 of 2 positions shown; findings below may reference images not displayed]

FINDINGS: The heart size and mediastinal contours are within normal limits.
Both lungs are clear. The visualized skeletal structures are
unremarkable.
IMPRESSION: No active cardiopulmonary disease.

## 2019-08-06 ENCOUNTER — Other Ambulatory Visit: Payer: Self-pay | Admitting: Cardiovascular Disease

## 2019-08-06 DIAGNOSIS — I4892 Unspecified atrial flutter: Secondary | ICD-10-CM

## 2019-08-07 NOTE — Telephone Encounter (Signed)
Pt is taking Metoprolol Succinate 25 mg qd. Pt mentioned that Dr.Gollan wanted her to wait until pt seen EP for consultation to cut her Metoprolol Succinate 25 mg tablet down to 1/2 tablet. Pt is still currently taking full tablet awaiting appointment and then will cut down if needed. Please advise if ok to refill for full tablet.

## 2019-08-08 MED ORDER — METOPROLOL SUCCINATE ER 25 MG PO TB24: 25.0000 mg | ORAL_TABLET | Freq: Every day | ORAL | 0 refills | Status: DC

## 2019-08-08 NOTE — Addendum Note (Signed)
Addended by: Britt Bottom on: 08/08/2019 04:51 PM   Modules accepted: Orders

## 2019-08-08 NOTE — Telephone Encounter (Signed)
Okay to refill metoprolol succinate 25 daily

## 2019-08-08 NOTE — Telephone Encounter (Signed)
Requested Prescriptions   Signed Prescriptions Disp Refills  . metoprolol succinate (TOPROL-XL) 25 MG 24 hr tablet 90 tablet 0    Sig: Take 1 tablet (25 mg total) by mouth daily.    Authorizing Provider: [name]    Ordering User: Britt Bottom

## 2019-08-08 NOTE — Telephone Encounter (Signed)
The patient was seen by Dr. Rockey Situ on 06/12/19.  Her HR was 55 bpm at that visit.   Per his office note: Atrialfibrillation/flutter, unspecified type ([place]) Underwent ablation maintaining normal sinus rhythm Sinus bradycardia with some fatigue on metoprolol succinate 25 daily Suggested she could try to take 12.5 mg daily She has extra metoprolol and antiarrhythmics for any recurrent arrhythmia  The patient is scheduled to see Dr. Curt Bears on 09/18/19.  Will forward to Dr. Rockey Situ to review for refill dosage.

## 2019-08-13 ENCOUNTER — Encounter: Payer: Self-pay | Admitting: Family Medicine

## 2019-08-14 DIAGNOSIS — M50221 Other cervical disc displacement at C4-C5 level: Secondary | ICD-10-CM | POA: Diagnosis not present

## 2019-08-14 DIAGNOSIS — M5412 Radiculopathy, cervical region: Secondary | ICD-10-CM | POA: Diagnosis not present

## 2019-08-14 DIAGNOSIS — M47812 Spondylosis without myelopathy or radiculopathy, cervical region: Secondary | ICD-10-CM | POA: Diagnosis not present

## 2019-08-14 DIAGNOSIS — M4802 Spinal stenosis, cervical region: Secondary | ICD-10-CM | POA: Diagnosis not present

## 2019-08-16 DIAGNOSIS — M545 Low back pain: Secondary | ICD-10-CM | POA: Diagnosis not present

## 2019-08-16 DIAGNOSIS — M5412 Radiculopathy, cervical region: Secondary | ICD-10-CM | POA: Diagnosis not present

## 2019-08-16 DIAGNOSIS — M5030 Other cervical disc degeneration, unspecified cervical region: Secondary | ICD-10-CM | POA: Diagnosis not present

## 2019-08-17 ENCOUNTER — Telehealth: Payer: Self-pay | Admitting: Cardiovascular Disease

## 2019-08-17 NOTE — Telephone Encounter (Signed)
Patient with diagnosis of afib on Eliquis for anticoagulation.    Procedure: cervical ESI Date of procedure: TBD  CHADS2-VASc score of 3 (age, sex, DM)  CrCl 48mL/min Platelet count 287K  Per office protocol, patient can hold Eliquis for 3 days prior to procedure and can resume 24 hours after procedure as requested.

## 2019-08-17 NOTE — Telephone Encounter (Signed)
   [facility] Pre-operative Risk Assessment    HEARTCARE STAFF: - Please ensure there is not already an duplicate clearance open for this procedure. - Under Visit Info/Reason for Call, type in Other and utilize the format Clearance MM/DD/YY or Clearance TBD. Do not use dashes or single digits. - If request is for dental extraction, please clarify the # of teeth to be extracted.  Request for surgical clearance:  1. What type of surgery is being performed? Cervical epidural steroid injection  2. When is this surgery scheduled? TBD  3. What type of clearance is required (medical clearance vs. Pharmacy clearance to hold med vs. Both)? both  4. Are there any medications that need to be held prior to surgery and how long? Eliquis 3 days prior and 24 hours post  5. Practice name and name of physician performing surgery? [facility]   6. What is the office phone number? [phone]   7.   What is the office fax number? [phone]  8.   Anesthesia type (None, local, MAC, general) ? none   Marykay Lex 08/17/2019, 12:12 PM  _________________________________________________________________   (provider comments below)

## 2019-08-17 NOTE — Telephone Encounter (Signed)
   Primary Cardiologist: Ida Rogue, MD  Chart reviewed as part of pre-operative protocol coverage. Given past medical history and time since last visit, based on ACC/AHA guidelines, Sonya Lopez would be at acceptable risk for the planned procedure without further cardiovascular testing.   Per office protocol, patient can hold Eliquis for 3 days prior to procedure and can resume 24 hours after procedure as requested.  I will route this recommendation to the requesting party via Epic fax function and remove from pre-op pool.  Please call with questions.  Charlie Pitter, PA-C 08/17/2019, 2:13 PM

## 2019-08-17 NOTE — Telephone Encounter (Signed)
   Primary Cardiologist: No primary care provider on file.  Chart reviewed as part of pre-operative protocol coverage. Patient was contacted 08/17/2019 in reference to pre-operative risk assessment for pending surgery as outlined below.  Sonya Lopez was last seen on 06/12/19 by Dr. Rockey Situ. She has history of DM, GERD, atrial fib and flutter, prior ablation 02/2019, anxiety, breast neoplasm, fibromyalgia, OSA. She affirms she's been doing great ever since her ablation without any significant cardiac symptoms - had one episode of AF a while back after ablation but none since. Therefore, based on ACC/AHA guidelines, the patient would be at acceptable risk for the planned procedure without further cardiovascular testing.   Will route to pharm for input on Eliquis.  Charlie Pitter, PA-C 08/17/2019, 1:43 PM

## 2019-08-22 DIAGNOSIS — M5412 Radiculopathy, cervical region: Secondary | ICD-10-CM | POA: Diagnosis not present

## 2019-09-05 ENCOUNTER — Other Ambulatory Visit: Payer: Self-pay | Admitting: Family Medicine

## 2019-09-05 ENCOUNTER — Encounter: Payer: Self-pay | Admitting: Family Medicine

## 2019-09-05 MED ORDER — GABAPENTIN 300 MG PO CAPS: 600.0000 mg | ORAL_CAPSULE | Freq: Every day | ORAL | 0 refills | Status: DC

## 2019-09-10 DIAGNOSIS — M5412 Radiculopathy, cervical region: Secondary | ICD-10-CM | POA: Diagnosis not present

## 2019-09-10 DIAGNOSIS — M5030 Other cervical disc degeneration, unspecified cervical region: Secondary | ICD-10-CM | POA: Diagnosis not present

## 2019-09-11 ENCOUNTER — Encounter: Payer: Self-pay | Admitting: Family Medicine

## 2019-09-11 ENCOUNTER — Telehealth: Payer: Self-pay | Admitting: Cardiovascular Disease

## 2019-09-11 ENCOUNTER — Other Ambulatory Visit: Payer: Self-pay

## 2019-09-11 ENCOUNTER — Ambulatory Visit (INDEPENDENT_AMBULATORY_CARE_PROVIDER_SITE_OTHER): Payer: Medicare Other | Admitting: Family Medicine

## 2019-09-11 VITALS — BP 116/62 | HR 66 | Temp 98.0°F | Resp 16 | Ht 66.0 in | Wt 224.0 lb

## 2019-09-11 DIAGNOSIS — G43009 Migraine without aura, not intractable, without status migrainosus: Secondary | ICD-10-CM | POA: Diagnosis not present

## 2019-09-11 DIAGNOSIS — E1169 Type 2 diabetes mellitus with other specified complication: Secondary | ICD-10-CM | POA: Diagnosis not present

## 2019-09-11 DIAGNOSIS — F320 Major depressive disorder, single episode, mild: Secondary | ICD-10-CM | POA: Diagnosis not present

## 2019-09-11 DIAGNOSIS — E785 Hyperlipidemia, unspecified: Secondary | ICD-10-CM | POA: Diagnosis not present

## 2019-09-11 DIAGNOSIS — M4722 Other spondylosis with radiculopathy, cervical region: Secondary | ICD-10-CM | POA: Diagnosis not present

## 2019-09-11 DIAGNOSIS — G4733 Obstructive sleep apnea (adult) (pediatric): Secondary | ICD-10-CM

## 2019-09-11 DIAGNOSIS — E538 Deficiency of other specified B group vitamins: Secondary | ICD-10-CM

## 2019-09-11 DIAGNOSIS — I480 Paroxysmal atrial fibrillation: Secondary | ICD-10-CM | POA: Diagnosis not present

## 2019-09-11 DIAGNOSIS — K219 Gastro-esophageal reflux disease without esophagitis: Secondary | ICD-10-CM | POA: Diagnosis not present

## 2019-09-11 DIAGNOSIS — E559 Vitamin D deficiency, unspecified: Secondary | ICD-10-CM | POA: Diagnosis not present

## 2019-09-11 DIAGNOSIS — Z8639 Personal history of other endocrine, nutritional and metabolic disease: Secondary | ICD-10-CM

## 2019-09-11 DIAGNOSIS — M797 Fibromyalgia: Secondary | ICD-10-CM | POA: Diagnosis not present

## 2019-09-11 DIAGNOSIS — E119 Type 2 diabetes mellitus without complications: Secondary | ICD-10-CM | POA: Diagnosis not present

## 2019-09-11 LAB — POCT GLYCOSYLATED HEMOGLOBIN (HGB A1C): Hemoglobin A1C: 5.8 % — AB (ref 4.0–5.6)

## 2019-09-11 MED ORDER — DULOXETINE HCL 20 MG PO CPEP: 40.0000 mg | ORAL_CAPSULE | Freq: Every day | ORAL | 1 refills | Status: DC

## 2019-09-11 MED ORDER — OMEPRAZOLE 20 MG PO CPDR: DELAYED_RELEASE_CAPSULE | ORAL | 1 refills | Status: DC

## 2019-09-11 MED ORDER — GABAPENTIN 300 MG PO CAPS: 600.0000 mg | ORAL_CAPSULE | Freq: Every day | ORAL | 1 refills | Status: DC

## 2019-09-11 NOTE — Telephone Encounter (Signed)
   [facility] Pre-operative Risk Assessment    HEARTCARE STAFF: - Please ensure there is not already an duplicate clearance open for this procedure. - Under Visit Info/Reason for Call, type in Other and utilize the format Clearance MM/DD/YY or Clearance TBD. Do not use dashes or single digits. - If request is for dental extraction, please clarify the # of teeth to be extracted.  Request for surgical clearance:  1. What type of surgery is being performed? Cervical epidural steroid injection  2. When is this surgery scheduled? TBD  3. What type of clearance is required (medical clearance vs. Pharmacy clearance to hold med vs. Both)? both  4. Are there any medications that need to be held prior to surgery and how long? Eliquis 3 days prior and 24 hours post  5. Practice name and name of physician performing surgery? [facility]   6. What is the office phone number? [phone]   7.   What is the office fax number? [phone]  8.   Anesthesia type (None, local, MAC, general) ? none  Clarisse Gouge 09/11/2019, 2:41 PM  _________________________________________________________________   (provider comments below)

## 2019-09-11 NOTE — Telephone Encounter (Signed)
   Primary Cardiologist: Ida Rogue, MD  Chart reviewed as part of pre-operative protocol coverage. Given past medical history and time since last visit, based on ACC/AHA guidelines, PEGAH SEGEL would be at acceptable risk for the planned procedure without further cardiovascular testing.   Based on July 2021 pre op notes OK to hold Eliquis 3 days pre op if needed, resume as soon as safe post op.  I will route this recommendation to the requesting party via Epic fax function and remove from pre-op pool.  Please call with questions.  Kerin Ransom, PA-C 09/11/2019, 3:13 PM

## 2019-09-11 NOTE — Progress Notes (Signed)
Name: Sonya Lopez   MRN: [id number]    DOB: [birth year]   Date:09/11/2019       Progress Note  Subjective  Chief Complaint  Chief Complaint  Patient presents with  . Migraine  . Sleep Apnea  . Depression  . Diabetes  . Atrial Fibrillation  . Fibromyalgia  . Hyperlipidemia    HPI  OSA: Dr. Jaynee Eagles ordered sleep study 07/2018, showed mild OSA, but had hypoxia of 82% and seen by Dr. Felicie Morn ( pulmonologist - but now sees a NP ) she states since she has been using CPAP machine back in July 2020 , she is compliant with CPAP, no longer waking up frequently with headaches, sleeps better at night and wakes up feeling refreshed   FMS: She states that Cymbalta and Gabapentin seems to be helping with symptoms,she still has mental fogginess, and aches all over, she states pain goes up and down. She states she has been bad over the past few days. Pain today is 3/10, she sates leg burning was severe last night   Bilateral hip pain: seen by [facility] and had PT ,she also has bursitis, worse on the left side, today the pain is a little higher 6-7/10 .  Atrial Flutter/Afib: Shehad a severe episode on April 4th, 2020, she had palpitation, dizziness and two episodes of syncope and husband called 911, Dr. Rockey Situ saw her on April 8th, 2020 and since that time she has been on Amiodarone and metoprolol, but  symptoms did not improve about  two episodes per week that were lasting  about 2-3 hours therefore she was referred to Dr. Curt Bears and had an ablation done on 02/28/2018, she states she is doing better, off Amiodarone, still taking Eliquis and no longer having episodes, only had one episode back in March  since ablation and lasted 30 minutes   Migraine Headaches:since started on A[place]y migraines has been controlled , about 2-3 per month until recently  but otherwise doing well. Usually left maxillary and behind left eye and radiates to nuchal area, sometimes associated with nausea, but no  vomiting. She has photophobia . She states since neck problems she has noticed more frequent headaches but does not seem a migraine   Mild Major Depression:she is still taking Duloxetine,phq 9 is normal today She stopped Trazodone and has been sleeping well on Gabapentin   History diabetes  : last abnormal A1C was many years ago, on diet only, we will remove diagnosis of diabetes to history of diabetes and monitor, recheck labs today  She denies polyphagia, no polyuria or polydipsia.   GERD: under control with medication, she tries alternating Prilosec and otc medication   History of left breast cancer: she had lumpectomy, could not tolerate estrogen suppressive therapy.mammogramis up to date.she is seeing Dr. Fleet Contras again   Morbid Obesity: she has DM, HTN , and BMI above 35. She resumed physical activity   DDD cervical spine: seeing Dr. Sharyne Richters, taking gabapentin at night, getting PT. Pain is on the right side, worse with rom, pain now is 6/10 , radiculitis down goes down to tip of 1-3 rd fingers. , she took medrol dose pack initially, she had steroid injection 07/21 and it has helped with paresthesia   Patient Active Problem List   Diagnosis Date Noted  . OSA (obstructive sleep apnea) 03/27/2019  . Central sleep apnea 07/06/2018  . Paroxysmal atrial fibrillation ([place]) 07/06/2018  . Chronic migraine without aura, with intractable migraine, so stated, with status migrainosus 06/20/2018  .  Syncope 05/10/2018  . Pain, elbow joint 02/02/2018  . PAF (paroxysmal atrial fibrillation) ([place]) 09/13/2017  . Trochanteric bursitis of left hip 06/24/2017  . Low back pain 02/22/2017  . Benign breast cyst in female, right 05/25/2016  . History of left breast cancer 09/16/2015  . Long-term use of high-risk medication 02/25/2015  . Increased urinary frequency 08/01/2014  . Mild major depression ([place]) 07/29/2014  . Gastric reflux 07/29/2014  . Chronic interstitial cystitis 07/29/2014  .  Calculus of kidney 07/29/2014  . Allergic rhinitis 07/29/2014  . Acne erythematosa 07/29/2014  . Lumbosacral radiculitis 07/29/2014  . Tinnitus 07/29/2014  . Mixed incontinence, urge and stress (female) (female) 08/23/2013  . Hyperlipidemia 04/25/2013  . H/O: hysterectomy 11/30/2011  . Atrial flutter ([place]) 05/03/2011  . Diet-controlled type 2 diabetes mellitus ([place]) 04/16/2011  . Bladder cystocele 01/05/2011  . Migraine without aura and without status migrainosus, not intractable 12/29/2010  . Fibromyalgia 12/29/2010  . Obesity 12/29/2010  . Migraine 12/29/2010    Past Surgical History:  Procedure Laterality Date  . ABDOMINAL HYSTERECTOMY  2013  . ANTERIOR AND POSTERIOR REPAIR  11/30/2011   Procedure: ANTERIOR (CYSTOCELE) AND POSTERIOR REPAIR (RECTOCELE);  Surgeon: Reece Packer, MD;  Location: [facility];  Service: Urology;  Laterality: N/A;  with cysto  . ATRIAL FIBRILLATION ABLATION N/A 03/01/2019   Procedure: ATRIAL FIBRILLATION ABLATION;  Surgeon: Constance Haw, MD;  Location: [facility];  Service: Cardiovascular;  Laterality: N/A;  . BLADDER SUSPENSION  11/30/2011   Procedure: [place] PROCEDURE;  Surgeon: Reece Packer, MD;  Location: [facility];  Service: Urology;  Laterality: N/A;  graft 10x6  . BREAST BIOPSY Right 2007   negative core  . BREAST BIOPSY Left 09/16/2015   DCIS  . BREAST LUMPECTOMY Left 09/30/2015   DCIS clear margins and rad tx/HIGH GRADE DUCTAL CARCINOMA IN SITU, COMEDO TYPE  . BREAST LUMPECTOMY WITH NEEDLE LOCALIZATION Left 09/30/2015   DCIS excision, MammoSite radiation;  Surgeon: Robert Bellow, MD;  Location: [facility];  Service: General;  Laterality: Left;  . CATARACT EXTRACTION Left 01/2019  . CESAREAN SECTION     x 1  . CHOLECYSTECTOMY  2005  . COLONOSCOPY  2009  . CYSTOSCOPY  11/30/2011   Procedure: CYSTOSCOPY;  Surgeon: Reece Packer, MD;  Location: [facility];  Service: Urology;;  . Brigitte Pulse AND CURETTAGE OF UTERUS  05/05/2007  .  GALLBLADDER SURGERY  2004  . TONSILLECTOMY  1961  . VAGINAL HYSTERECTOMY  11/30/2011   Procedure: HYSTERECTOMY VAGINAL;  Surgeon: Emily Filbert, MD;  Location: [facility];  Service: Gynecology;  Laterality: N/A;    Family History  Problem Relation Age of Onset  . Diabetes Mother   . Hearing loss Mother   . Hypertension Mother   . Heart disease Father   . Heart disease Paternal Grandfather   . Asthma Daughter   . Asthma Daughter   . Migraines Daughter   . Fibromyalgia Daughter   . Interstitial cystitis Daughter   . Asthma Daughter   . Colitis Daughter   . Thyroid cancer Daughter 37  . Diabetes Brother   . Hypertension Brother   . Breast cancer Neg Hx     Social History   Tobacco Use  . Smoking status: Never Smoker  . Smokeless tobacco: Never Used  Substance Use Topics  . Alcohol use: Not Currently    Alcohol/week: 0.0 standard drinks    Comment: Due to AF     Current Outpatient Medications:  .  acetaminophen (TYLENOL) 650 MG CR tablet, Take 1,300 mg by mouth as needed for pain. , Disp: , Rfl:  .  acetaminophen-codeine (TYLENOL #3) 300-30 MG tablet, Take 1 tablet by mouth every 4 (four) hours as needed for moderate pain or severe pain., Disp: 30 tablet, Rfl: 3 .  apixaban (ELIQUIS) 5 MG TABS tablet, Take 1 tablet (5 mg total) by mouth 2 (two) times daily., Disp: 180 tablet, Rfl: 2 .  Cholecalciferol (PA VITAMIN D-3 GUMMY PO), Takes 2 gummies daily, Disp: , Rfl:  .  FINACEA 15 % FOAM, Apply 1 application topically daily as needed (rosacea). , Disp: , Rfl:  .  Fremanezumab-vfrm (AJOVY) 225 MG/1.5ML SOSY, Inject 225 mg into the skin every 30 (thirty) days., Disp: 6 Syringe, Rfl: 0 .  magnesium oxide (MAG-OX) 400 MG tablet, Take 400 mg by mouth daily. In the morning, Disp: , Rfl:  .  metoprolol succinate (TOPROL-XL) 25 MG 24 hr tablet, Take 1 tablet (25 mg total) by mouth daily., Disp: 90 tablet, Rfl: 0 .  naratriptan (AMERGE) 2.5 MG tablet, Take 1 tablet (2.5 mg total) by mouth  See admin instructions. ONSET OF HEADACHE. MAY REPEAT AFTER 4 HOURSIF RETURNS OR DOES NOT RESOLVE MAX OF 2 TABS PER DAY.                  Dose:    2.5 mg, Disp: 9 tablet, Rfl: 5 .  Probiotic Product (PROBIOTIC PO), Take 1 capsule by mouth daily. , Disp: , Rfl:  .  propranolol (INDERAL) 10 MG tablet, TAKE ONE TABLET EVERY DAY WHEN TAKING EXTRA FLECAINIDE FOR BREAKTHROUGH A-FIB (Patient taking differently: Take 10 mg by mouth daily as needed (BREAKTHROUGH A-FIB). ), Disp: 30 tablet, Rfl: 1 .  rizatriptan (MAXALT-MLT) 10 MG disintegrating tablet, Take 1 tablet (10 mg total) by mouth as needed for migraine. May repeat in 2 hours if needed, Disp: 9 tablet, Rfl: 11 .  vitamin B-12 (CYANOCOBALAMIN) 1000 MCG tablet, Take 1,000 mcg by mouth every 7 (seven) days., Disp: , Rfl:  .  DULoxetine (CYMBALTA) 20 MG capsule, Take 2 capsules (40 mg total) by mouth daily., Disp: 180 capsule, Rfl: 1 .  gabapentin (NEURONTIN) 300 MG capsule, Take 2 capsules (600 mg total) by mouth at bedtime., Disp: 180 capsule, Rfl: 1 .  methocarbamol (ROBAXIN) 750 MG tablet, Take 1 tablet by mouth daily as needed., Disp: , Rfl:  .  omeprazole (PRILOSEC) 20 MG capsule, Daily, Disp: 90 capsule, Rfl: 1  Allergies  Allergen Reactions  . Adhesive [Tape] Dermatitis    SKIN TURNS RED-PAPER TAPE OK TO USE  . Macrodantin [Nitrofurantoin] Nausea And Vomiting  . Metronidazole Nausea Only  . Nitrofurantoin Macrocrystal Nausea And Vomiting  . Sulfamethoxazole-Trimethoprim Nausea Only  . Cephalexin Rash  . Clindamycin Hcl Rash  . Doxycycline Swelling and Rash  . Erythromycin Rash    I personally reviewed active problem list, medication list, allergies, family history, social history, health maintenance with the patient/caregiver today.   ROS  Constitutional: Negative for fever , positive for  weight change.  Respiratory: Negative for cough and shortness of breath.   Cardiovascular: Negative for chest pain or palpitations.   Gastrointestinal: Negative for abdominal pain, no bowel changes.  Musculoskeletal: Negative for gait problem or joint swelling.  Skin: Negative for rash.  Neurological: Negative for dizziness , positive for intermittent headache.  No other specific complaints in a complete review of systems (except as listed in HPI above).  Objective  Vitals:  09/11/19 0957  BP: 116/62  Pulse: 66  Resp: 16  Temp: 98 F (36.7 C)  TempSrc: Temporal  SpO2: 98%  Weight: 224 lb (101.6 kg)  Height: 5\' 6"  (1.676 m)    Body mass index is 36.15 kg/m.  Physical Exam  Constitutional: Patient appears well-developed and well-nourished. Obese  No distress.  HEENT: head atraumatic, normocephalic, pupils equal and reactive to light,  neck supple, but has decrease rom , it causes pain  Cardiovascular: Normal rate, regular rhythm and normal heart sounds.  No murmur heard. No BLE edema. Pulmonary/Chest: Effort normal and breath sounds normal. No respiratory distress. Muscular Skeletal: trigger point positive  Abdominal: Soft.  There is no tenderness. Psychiatric: Patient has a normal mood and affect. behavior is normal. Judgment and thought content normal.   Diabetic Foot Exam: Diabetic Foot Exam - Simple   Simple Foot Form Diabetic Foot exam was performed with the following findings: Yes 09/11/2019 10:51 AM  Visual Inspection No deformities, no ulcerations, no other skin breakdown bilaterally: Yes Sensation Testing Intact to touch and monofilament testing bilaterally: Yes Pulse Check Posterior Tibialis and Dorsalis pulse intact bilaterally: Yes Comments      PHQ2/9: Depression screen [facility] 2/9 09/11/2019 04/20/2019 03/14/2019 09/01/2018 03/13/2018  Decreased Interest 0 0 0 0 0  Down, Depressed, Hopeless 0 0 0 0 0  PHQ - 2 Score 0 0 0 0 0  Altered sleeping 1 - 3 0 1  Tired, decreased energy 1 - 0 0 2  Change in appetite 1 - 0 0 0  Feeling bad or failure about yourself  1 - 0 0 0  Trouble  concentrating 0 - 0 0 0  Moving slowly or fidgety/restless 0 - 0 0 0  Suicidal thoughts 0 - 0 0 0  PHQ-9 Score 4 - 3 0 3  Difficult doing work/chores Not difficult at all - Not difficult at all - Not difficult at all  Some recent data might be hidden    phq 9 is positive - not able to be active and gaining weight, but started to walking again    Fall Risk: Fall Risk  09/11/2019 04/20/2019 03/14/2019 09/01/2018 06/19/2018  Falls in the past year? 0 1 1 0 1  Number falls in past yr: 0 1 0 0 1  Comment - - - - pt states this was cardiac related  Injury with Fall? 0 1 1 0 1  Comment - - - - bumped head, CT ok per pt   Risk for fall due to : - History of fall(s) - - Other (Comment)  Risk for fall due to: Comment - - - - heart problems  Follow up - Falls prevention discussed Falls evaluation completed - Falls prevention discussed     Assessment & Plan  1. Diet-controlled type 2 diabetes mellitus (HCC)  - POCT HgB A1C - Urine Microalbumin w/creat. ratio  2. B12 deficiency  - CBC with Differential/Platelet - Vitamin B12  3. Fibromyalgia  - DULoxetine (CYMBALTA) 20 MG capsule; Take 2 capsules (40 mg total) by mouth daily.  Dispense: 180 capsule; Refill: 1  4. Major depression mild  (HCC)   5. Paroxysmal atrial fibrillation ([place])  S/p ablation   6. OSA (obstructive sleep apnea)  Compliant with CPAP  7. Vitamin D deficiency  - VITAMIN D 25 Hydroxy (Vit-D Deficiency, Fractures)  8. Migraine without aura and without status migrainosus, not intractable  Doing well   9. Morbid obesity ([place])  Discussed with the patient  the risk posed by an increased BMI. Discussed importance of portion control, calorie counting and at least 150 minutes of physical activity weekly. Avoid sweet beverages and drink more water. Eat at least 6 servings of fruit and vegetables daily   10. Dyslipidemia associated with type 2 diabetes mellitus (HCC)  - Lipid panel - COMPLETE METABOLIC PANEL WITH  GFR  11. GERD without esophagitis  - omeprazole (PRILOSEC) 20 MG capsule; Daily  Dispense: 90 capsule; Refill: 1  12. Cervical radiculopathy due to degenerative joint disease of spine  - gabapentin (NEURONTIN) 300 MG capsule; Take 2 capsules (600 mg total) by mouth at bedtime.  Dispense: 180 capsule; Refill: 1

## 2019-09-12 ENCOUNTER — Encounter: Payer: Self-pay | Admitting: Family Medicine

## 2019-09-12 ENCOUNTER — Other Ambulatory Visit: Payer: Self-pay | Admitting: Family Medicine

## 2019-09-12 LAB — MICROALBUMIN / CREATININE URINE RATIO
Creatinine, Urine: 204 mg/dL (ref 20–275)
Microalb Creat Ratio: 15 ug/mg{creat}
Microalb, Ur: 3 mg/dL

## 2019-09-12 LAB — CBC WITH DIFFERENTIAL/PLATELET
Absolute Monocytes: 440 {cells}/uL (ref 200–950)
Basophils Absolute: 28 {cells}/uL (ref 0–200)
Basophils Relative: 0.4 %
Eosinophils Absolute: 78 {cells}/uL (ref 15–500)
Eosinophils Relative: 1.1 %
HCT: 38.1 % (ref 35.0–45.0)
Hemoglobin: 12.6 g/dL (ref 11.7–15.5)
Lymphs Abs: 1335 {cells}/uL (ref 850–3900)
MCH: 31.4 pg (ref 27.0–33.0)
MCHC: 33.1 g/dL (ref 32.0–36.0)
MCV: 95 fL (ref 80.0–100.0)
MPV: 9.7 fL (ref 7.5–12.5)
Monocytes Relative: 6.2 %
Neutro Abs: 5219 {cells}/uL (ref 1500–7800)
Neutrophils Relative %: 73.5 %
Platelets: 299 Thousand/uL (ref 140–400)
RBC: 4.01 Million/uL (ref 3.80–5.10)
RDW: 13.6 % (ref 11.0–15.0)
Total Lymphocyte: 18.8 %
WBC: 7.1 Thousand/uL (ref 3.8–10.8)

## 2019-09-12 LAB — COMPLETE METABOLIC PANEL WITHOUT GFR
AG Ratio: 1.6 (calc) (ref 1.0–2.5)
ALT: 14 U/L (ref 6–29)
AST: 13 U/L (ref 10–35)
Albumin: 3.8 g/dL (ref 3.6–5.1)
Alkaline phosphatase (APISO): 117 U/L (ref 37–153)
BUN/Creatinine Ratio: 19 (calc) (ref 6–22)
BUN: 19 mg/dL (ref 7–25)
CO2: 28 mmol/L (ref 20–32)
Calcium: 9.1 mg/dL (ref 8.6–10.4)
Chloride: 105 mmol/L (ref 98–110)
Creat: 1.02 mg/dL — ABNORMAL HIGH (ref 0.50–0.99)
GFR, Est African American: 66 mL/min/1.73m2
GFR, Est Non African American: 57 mL/min/1.73m2 — ABNORMAL LOW
Globulin: 2.4 g/dL (ref 1.9–3.7)
Glucose, Bld: 87 mg/dL (ref 65–99)
Potassium: 4.8 mmol/L (ref 3.5–5.3)
Sodium: 141 mmol/L (ref 135–146)
Total Bilirubin: 0.4 mg/dL (ref 0.2–1.2)
Total Protein: 6.2 g/dL (ref 6.1–8.1)

## 2019-09-12 LAB — LIPID PANEL
Cholesterol: 196 mg/dL
HDL: 68 mg/dL
LDL Cholesterol (Calc): 110 mg/dL — ABNORMAL HIGH
Non-HDL Cholesterol (Calc): 128 mg/dL
Total CHOL/HDL Ratio: 2.9 (calc)
Triglycerides: 86 mg/dL

## 2019-09-12 LAB — VITAMIN B12: Vitamin B-12: 269 pg/mL (ref 200–1100)

## 2019-09-12 LAB — VITAMIN D 25 HYDROXY (VIT D DEFICIENCY, FRACTURES): Vit D, 25-Hydroxy: 29 ng/mL — ABNORMAL LOW (ref 30–100)

## 2019-09-12 MED ORDER — METHOCARBAMOL 750 MG PO TABS: 750.0000 mg | ORAL_TABLET | Freq: Every day | ORAL | 0 refills | Status: DC | PRN

## 2019-09-14 ENCOUNTER — Encounter: Payer: Self-pay | Admitting: Family Medicine

## 2019-09-14 ENCOUNTER — Other Ambulatory Visit: Payer: Self-pay | Admitting: Family Medicine

## 2019-09-14 DIAGNOSIS — M4722 Other spondylosis with radiculopathy, cervical region: Secondary | ICD-10-CM

## 2019-09-14 MED ORDER — HYDROCODONE-ACETAMINOPHEN 10-325 MG PO TABS: 1.0000 | ORAL_TABLET | Freq: Four times a day (QID) | ORAL | 0 refills | Status: AC | PRN

## 2019-09-18 ENCOUNTER — Ambulatory Visit: Payer: Medicare Other | Admitting: Cardiology

## 2019-09-20 ENCOUNTER — Telehealth: Payer: Self-pay | Admitting: Neurology

## 2019-09-20 DIAGNOSIS — M47812 Spondylosis without myelopathy or radiculopathy, cervical region: Secondary | ICD-10-CM | POA: Diagnosis not present

## 2019-09-20 DIAGNOSIS — M542 Cervicalgia: Secondary | ICD-10-CM | POA: Diagnosis not present

## 2019-09-20 DIAGNOSIS — M5412 Radiculopathy, cervical region: Secondary | ICD-10-CM | POA: Diagnosis not present

## 2019-09-20 NOTE — Telephone Encounter (Signed)
Sonya Lopez: Patient called, she is having extreme neck pain with radiculopathy. Can you get the report AND the CD from [facility] on the MRI cervical spine that was just completed? May need to send her for surgical options  Bethany: would you remind me about this next week so it doesn;t fall through the cracks? thanks

## 2019-09-20 NOTE — Telephone Encounter (Signed)
Request made to [facility].

## 2019-09-21 ENCOUNTER — Encounter: Payer: Self-pay | Admitting: Family Medicine

## 2019-09-24 NOTE — Telephone Encounter (Signed)
Per [place] registry, last filled 09/13/2019 Acetaminophen-Cod #3 Tablet  #21.00  by provider Th Dim, one time refill. Also filled 20 tablets of Hydrocodone-Acetamin 10-325 Mg on 09/15/2019 prescribed by a Dr Ancil Boozer with internal medicine, one time refill. Appears pt does have active refills of the Acetaminophen-Cod #3 from Dr Jaynee Eagles and last filled the prescription from Dr Jaynee Eagles on 08/29/19.

## 2019-10-02 ENCOUNTER — Telehealth: Payer: Self-pay | Admitting: *Deleted

## 2019-10-02 NOTE — Telephone Encounter (Signed)
R/c report from [facility] in [place]. Waiting on the cd.

## 2019-10-02 NOTE — Telephone Encounter (Signed)
I called [facility] for update. Waiting for a return call.

## 2019-10-04 ENCOUNTER — Other Ambulatory Visit: Payer: Self-pay | Admitting: Neurosurgery

## 2019-10-04 DIAGNOSIS — M5412 Radiculopathy, cervical region: Secondary | ICD-10-CM | POA: Diagnosis not present

## 2019-10-04 NOTE — Telephone Encounter (Signed)
Noted thanks °

## 2019-10-09 ENCOUNTER — Other Ambulatory Visit: Payer: Self-pay | Admitting: General Surgery

## 2019-10-09 ENCOUNTER — Other Ambulatory Visit: Payer: Self-pay | Admitting: Family Medicine

## 2019-10-09 ENCOUNTER — Encounter: Payer: Self-pay | Admitting: Family Medicine

## 2019-10-09 DIAGNOSIS — Z79811 Long term (current) use of aromatase inhibitors: Secondary | ICD-10-CM

## 2019-10-09 DIAGNOSIS — Z853 Personal history of malignant neoplasm of breast: Secondary | ICD-10-CM

## 2019-10-09 NOTE — Telephone Encounter (Signed)
Requested medication (s) are due for refill today: no  Requested medication (s) are on the active medication list: yes  Last refill:  09/12/19 #90 0 refills  Future visit scheduled: yes   Notes to clinic:  not delegated per protocol     Requested Prescriptions  Pending Prescriptions Disp Refills   methocarbamol (ROBAXIN) 750 MG tablet [Pharmacy Med Name: METHOCARBAMOL 750 MG TAB] 90 tablet 0    Sig: TAKE 1 TABLET BY MOUTH DAILY AS NEEDED      Not Delegated - Analgesics:  Muscle Relaxants Failed - 10/09/2019  3:51 PM      Failed - This refill cannot be delegated      Passed - Valid encounter within last 6 months    Recent Outpatient Visits           4 weeks ago History of diabetes mellitus, type II   [facility] Steele Sizer, MD   6 months ago OSA (obstructive sleep apnea)   [facility] Steele Sizer, MD   1 year ago Mild major depression [facility])   [facility] Steele Sizer, MD   1 year ago [facility] preventive visit   [facility] Steele Sizer, MD   1 year ago Diet-controlled type 2 diabetes mellitus [facility])   [facility] Steele Sizer, MD       Future Appointments             In 1 week Brendolyn Patty, MD [place]   In [place], [place], MD [place], [facility]   In 5 months Ancil Boozer, Drue Stager, MD [facility], [place]   In 6 months  [facility], [facility]

## 2019-10-15 ENCOUNTER — Telehealth: Payer: Self-pay | Admitting: Neurology

## 2019-10-15 NOTE — Telephone Encounter (Signed)
Sonya Lopez: Patient has an appointment tomorrow for migraine follow-up.  In the past we decided to do Botox, unfortunately something came up when she had a Botox appointment schedule, we do see if she would like tomorrow's appointment to be Botox; we cannot do an appointment along with Botox but we can definitely do one or the other.  Thanks.  Bethany FYI.

## 2019-10-15 NOTE — Telephone Encounter (Signed)
I called the patient. She states that she is having surgery next week and is afraid to start Botox prior to this. She would like to come in only to discuss her migraines and show Dr. Jaynee Eagles her MRI CD. FYI

## 2019-10-15 NOTE — Progress Notes (Signed)
[facility]    Provider:  Dr Jaynee Eagles Requesting Provider: Steele Sizer, MD Primary Care Provider:  Steele Sizer, MD  CC:  Back pain, migraines, nack pain  Update 10/16/2019: She has been having severe neck pain and radiation. MRi showed broad based disc protrusion c5-c6 causing severe right and moderate left foraminal stenosis, c6-c7 central right paracentral disc protrusion causing right ventral spinal cord abutment (reviewed CD images she brought today). I discussed surgery and agree she needs ACDF. She also has degenrative disease in the lumbar spine but not severe and feeling better in the low back, she is having left-sided sciatica, it comes and goes but doesn't stay. We reviewed her MRI lumbar spine images with arthtritic changes and mild forminal stenosis but no nerve pinching.   No afib since the ablation. She is going to come off the Eliquis 3 days prior to surgery.  As far as migraines she is doing better, Ajovy alone is helping and the ones she does have a few and they are very responsive to acute medications. Will hold off on botox. Will give her 6 months samples of Ajovy and see if we can get insurance to approve Ajovy if not we will try Emgality.   HPI 06/2018:  Sonya Lopez is a lovely 64 y.o. female here as requested by Steele Sizer, MD for migraines. PMHx migraines, lumbar radiculopathy, obesity, kidney stones, fibromyalgia, atrial fibrillation on long-term anticoagulation, diet-controlled diabetes 2, depression, breast neoplasm, anxiety.  Symptoms started in 1993. Bending over in the garden and felt a sharp pain in the left buttocks and hips. Worsening since then. Diagnosed with bursitis and lumbar radic. She always has pain down the left leg. Worse bending over to clean bathtub. Starts in the buttocks and has pain in the buttocks (points to the bursa) but radiates down the back of the leg and tender in the lateral thigh. She has been to  orthopaedics. She had physical therapy which helped the right side which started hurting but the left side never improved. She went back to orthopaedics. It aches all the time. Shooting pain bending over, when she walks a long time, she has sit down after walking a certain distance to rest. Resting and sitting helps, but sitting too long makes it worse. Getting up and walking helps if she doesn't go too far. She has paresthesias in the left lower leg. She also feels weakness in her left leg, her leg will "give out" slightly not enough to fall but weakness and sititing down helps. Weakness worse going up the steps.   She has left-side pain in the arm as well. She has tightness in the cervical areas. A lot of tightness. She has neck pain but doesn't c/o of radicular pain. And this hurts with the migraines as well. Discussed dry needling.She has tenderness at the base of the ventral palms. No numbness or tingling, no weakness. Worse with typing a lot during the day, no nocturnal awakenings.Consider wrist splints.   Migraines: She has a history of migraines. She has a headache 18 days of the month. 8-10 migraine days a month. She uses a triptan which helps. She takes amerge. She sometimes has to take a second. Some it doesn't. Migraines are on the left side, behind the eye sna temple, radiates to the left side of the head and into the left cervical muscles. Migrianes are oderately severe or severe, hurts so badly she can;t keep her eye open, nausea, no vomiting, light and sound sensitivity.  movement makes it worse, ice helps. Unilateral, usually on the left but can be on the right. Ongoing at this frequency for > 10 years. Even since the 70s. She has just lived with it. In the last 10 years they are better than they were but still affecting life and can be very bad. She can't move, lay in a dark room. No new vision changes. She wakes up with headache in the morning very often. She has afib, she snore sometimes,  really fatigued, never wakes feeling rested, she wakes frequently, fatigued all day. She was diagnosed with fibromyalgia in 12/2000. She had pressure points, pain, sore, burning, difficulty concentrating, fatigue, tinging in the left toe and has some itching in the toe. She has had rashes on her feet and upper legs, she has joint pain in different areas, her fingers get sore. She has dry mouth, no color changes I the fingers, she gets blisters on her tongue a lot and inner cheeks.   She feels her word-finding skills are impaired. She has been intelligent, she works hard, she was evaluated in the past for skipping grades growing up. She feels the word finding. It will eventually. She is tired. She has mild depression. She has been taking care of everyone else, her daughter, her mother, hr grandchild and she is tired and worn out. She taking care of herself more, feels it is getting better.   Meds tried: lyrica, Cymbalta, gabapentin, metoprolol, amerge, imipamine, propranolol, venlafaxine  Reviewed notes, labs and imaging from outside physicians, which showed:  Ct showed No acute intracranial abnormalities including mass lesion or mass effect, hydrocephalus, extra-axial fluid collection, midline shift, hemorrhage, or acute infarction, large ischemic events (personally reviewed images) 02/2018 ldl 108 hgba1c 5.5 Creating, BUN normal  02/2017: B12 643 Vit D 35 HgbA1c 6.4 TSH nml  Review of Systems: Patient complains of symptoms per HPI as well as the following symptoms: back pain, fibromyalgia, fatigue. Pertinent negatives and positives per HPI. All others negative.   Social History   Socioeconomic History  . Marital status: Married    Spouse name: Environmental consultant   . Number of children: 3  . Years of education: 16  . Highest education level: Bachelor's degree (e.g., BA, AB, BS)  Occupational History  . Not on file  Tobacco Use  . Smoking status: Never Smoker  . Smokeless tobacco: Never Used   Vaping Use  . Vaping Use: Never used  Substance and Sexual Activity  . Alcohol use: Not Currently    Alcohol/week: 0.0 standard drinks    Comment: Due to AF  . Drug use: No  . Sexual activity: Yes    Partners: Male    Birth control/protection: Surgical  Other Topics Concern  . Not on file  Social History Narrative   Lives at home with husband and one daughter   Right handed   Caffeine: about 1 cup daily   Social Determinants of Health   Financial Resource Strain: Low Risk   . Difficulty of Paying Living Expenses: Not hard at all  Food Insecurity: No Food Insecurity  . Worried About Charity fundraiser in the Last Year: Never true  . Ran Out of Food in the Last Year: Never true  Transportation Needs: No Transportation Needs  . Lack of Transportation (Medical): No  . Lack of Transportation (Non-Medical): No  Physical Activity: Insufficiently Active  . Days of Exercise per Week: 4 days  . Minutes of Exercise per Session: 20 min  Stress: No  Stress Concern Present  . Feeling of Stress : Not at all  Social Connections: Socially Integrated  . Frequency of Communication with Friends and Family: More than three times a week  . Frequency of Social Gatherings with Friends and Family: More than three times a week  . Attends Religious Services: More than 4 times per year  . Active Member of Clubs or Organizations: Yes  . Attends Archivist Meetings: More than 4 times per year  . Marital Status: Married  Human resources officer Violence: Not At Risk  . Fear of Current or Ex-Partner: No  . Emotionally Abused: No  . Physically Abused: No  . Sexually Abused: No    Family History  Problem Relation Age of Onset  . Diabetes Mother   . Hearing loss Mother   . Hypertension Mother   . Heart disease Father   . Heart disease Paternal Grandfather   . Asthma Daughter   . Asthma Daughter   . Migraines Daughter   . Fibromyalgia Daughter   . Interstitial cystitis Daughter   . Asthma  Daughter   . Colitis Daughter   . Thyroid cancer Daughter 26  . Diabetes Brother   . Hypertension Brother   . Breast cancer Neg Hx     Past Medical History:  Diagnosis Date  . Allergy   . Anxiety    situational anxiety  . Arrhythmia    A-fib/A-flutter  . Atrial flutter ([place])   . 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  MammoSite  . Cancer [facility]) 2017   Left- DCIS  . Depression   . Diet-controlled type 2 diabetes mellitus ([place])   . Difficult intubation 2009   Martin had to use  'boogee"  . Female cystocele 01/05/2011  . Fibromyalgia   . GERD (gastroesophageal reflux disease)   . Headache(784.0)   . Kidney stones   . Obesity   . OSA (obstructive sleep apnea) 03/27/2019  . Personal history of radiation therapy 2017   LEFT lumpectomy  . PONV (postoperative nausea and vomiting)    h/o difficult intubation and anes had to use "boogee"  . Vitreous detachment of right eye    sees floaters    Patient Active Problem List   Diagnosis Date Noted  . History of diabetes mellitus, type II 09/11/2019  . OSA (obstructive sleep apnea) 03/27/2019  . Central sleep apnea 07/06/2018  . Chronic migraine without aura, with intractable migraine, so stated, with status migrainosus 06/20/2018  . Syncope 05/10/2018  . Pain, elbow joint 02/02/2018  . PAF (paroxysmal atrial fibrillation) ([place]) 09/13/2017  . Trochanteric bursitis of left hip 06/24/2017  . Low back pain 02/22/2017  . Benign breast cyst in female, right 05/25/2016  . History of left breast cancer 09/16/2015  . Long-term use of high-risk medication 02/25/2015  . Increased urinary frequency 08/01/2014  . Mild major depression ([place]) 07/29/2014  . Gastric reflux 07/29/2014  . Chronic interstitial cystitis 07/29/2014  . Calculus of kidney 07/29/2014  . Allergic rhinitis 07/29/2014  . Acne erythematosa 07/29/2014  . Lumbosacral radiculitis  07/29/2014  . Tinnitus 07/29/2014  . Mixed incontinence, urge and stress (female) (female) 08/23/2013  . Hyperlipidemia 04/25/2013  . H/O: hysterectomy 11/30/2011  . Atrial flutter ([place]) 05/03/2011  . Bladder cystocele 01/05/2011  . Migraine without aura and without status migrainosus, not intractable 12/29/2010  . Fibromyalgia 12/29/2010  . Obesity 12/29/2010  . Migraine 12/29/2010  Past Surgical History:  Procedure Laterality Date  . ABDOMINAL HYSTERECTOMY  2013  . ANTERIOR AND POSTERIOR REPAIR  11/30/2011   Procedure: ANTERIOR (CYSTOCELE) AND POSTERIOR REPAIR (RECTOCELE);  Surgeon: Reece Packer, MD;  Location: [facility];  Service: Urology;  Laterality: N/A;  with cysto  . ATRIAL FIBRILLATION ABLATION N/A 03/01/2019   Procedure: ATRIAL FIBRILLATION ABLATION;  Surgeon: Constance Haw, MD;  Location: [facility];  Service: Cardiovascular;  Laterality: N/A;  . BLADDER SUSPENSION  11/30/2011   Procedure: [place] PROCEDURE;  Surgeon: Reece Packer, MD;  Location: [facility];  Service: Urology;  Laterality: N/A;  graft 10x6  . BREAST BIOPSY Right 2007   negative core  . BREAST BIOPSY Left 09/16/2015   DCIS  . BREAST LUMPECTOMY Left 09/30/2015   DCIS clear margins and rad tx/HIGH GRADE DUCTAL CARCINOMA IN SITU, COMEDO TYPE  . BREAST LUMPECTOMY WITH NEEDLE LOCALIZATION Left 09/30/2015   DCIS excision, MammoSite radiation;  Surgeon: Robert Bellow, MD;  Location: [facility];  Service: General;  Laterality: Left;  . CATARACT EXTRACTION Left 01/2019  . CESAREAN SECTION     x 1  . CHOLECYSTECTOMY  2005  . COLONOSCOPY  2009  . CYSTOSCOPY  11/30/2011   Procedure: CYSTOSCOPY;  Surgeon: Reece Packer, MD;  Location: [facility];  Service: Urology;;  . Brigitte Pulse AND CURETTAGE OF UTERUS  05/05/2007  . GALLBLADDER SURGERY  2004  . TONSILLECTOMY  1961  . VAGINAL HYSTERECTOMY  11/30/2011   Procedure: HYSTERECTOMY VAGINAL;  Surgeon: Emily Filbert, MD;  Location: [facility];  Service:  Gynecology;  Laterality: N/A;    Current Outpatient Medications  Medication Sig Dispense Refill  . acetaminophen (TYLENOL) 650 MG CR tablet Take 1,300 mg by mouth every 8 (eight) hours as needed for pain.     Marland Kitchen acetaminophen-codeine (TYLENOL #3) 300-30 MG tablet Take 1 tablet by mouth every 4 (four) hours as needed for moderate pain or severe pain. 30 tablet 3  . amiodarone (PACERONE) 200 MG tablet Take 200 mg by mouth daily as needed (afib).    Marland Kitchen apixaban (ELIQUIS) 5 MG TABS tablet Take 1 tablet (5 mg total) by mouth 2 (two) times daily. 180 tablet 2  . Cholecalciferol (PA VITAMIN D-3 GUMMY PO) Takes 2 gummies daily    . Cyanocobalamin (VITAMIN B-12 PO) Place 1 tablet under the tongue daily.     . DULoxetine (CYMBALTA) 20 MG capsule Take 2 capsules (40 mg total) by mouth daily. (Patient taking differently: Take 20 mg by mouth 2 (two) times daily. ) 180 capsule 1  . FINACEA 15 % FOAM Apply 1 application topically daily as needed (rosacea).     . Fremanezumab-vfrm (AJOVY) 225 MG/1.5ML SOSY Inject 225 mg into the skin every 30 (thirty) days. 6 Syringe 0  . gabapentin (NEURONTIN) 300 MG capsule Take 2 capsules (600 mg total) by mouth at bedtime. 180 capsule 1  . ibuprofen (ADVIL) 200 MG tablet Take 800 mg by mouth every 8 (eight) hours as needed for moderate pain.    . magnesium oxide (MAG-OX) 400 MG tablet Take 400 mg by mouth daily.     . Menthol-Methyl Salicylate (SALONPAS PAIN RELIEF PATCH EX) Place 1 patch onto the skin daily as needed (pain).    . methocarbamol (ROBAXIN) 750 MG tablet TAKE 1 TABLET BY MOUTH DAILY AS NEEDED (Patient taking differently: Take 750 mg by mouth every 8 (eight) hours as needed for muscle spasms. ) 90 tablet 0  . metoprolol  succinate (TOPROL-XL) 25 MG 24 hr tablet Take 1 tablet (25 mg total) by mouth daily. (Patient taking differently: Take 25 mg by mouth at bedtime. ) 90 tablet 0  . naratriptan (AMERGE) 2.5 MG tablet Take 1 tablet (2.5 mg total) by mouth See admin  instructions. ONSET OF HEADACHE. MAY REPEAT AFTER 4 HOURSIF RETURNS OR DOES NOT RESOLVE MAX OF 2 TABS PER DAY.                  Dose:    2.5 mg 9 tablet 5  . omeprazole (PRILOSEC) 20 MG capsule Daily (Patient taking differently: Take 20 mg by mouth daily. ) 90 capsule 1  . Probiotic Product (PROBIOTIC PO) Take 1 capsule by mouth daily.     . propranolol (INDERAL) 10 MG tablet TAKE ONE TABLET EVERY DAY WHEN TAKING EXTRA AMIODARONE FOR BREAKTHROUGH A-FIB    . rizatriptan (MAXALT-MLT) 10 MG disintegrating tablet Take 1 tablet (10 mg total) by mouth as needed for migraine. May repeat in 2 hours if needed 9 tablet 11  . trolamine salicylate (ASPERCREME) 10 % cream Apply 1 application topically as needed for muscle pain.    . Fremanezumab-vfrm (AJOVY) 225 MG/1.5ML SOAJ Inject 225 mg into the skin every 30 (thirty) days. 1.5 mL 11  . Fremanezumab-vfrm (AJOVY) 225 MG/1.5ML SOAJ Inject 225 mg into the skin every 30 (thirty) days. 9 mL 11   No current facility-administered medications for this visit.    Allergies as of 10/16/2019 - Review Complete 10/16/2019  Allergen Reaction Noted  . Adhesive [tape] Dermatitis 11/23/2011  . Macrodantin [nitrofurantoin] Nausea And Vomiting 11/15/2011  . Metronidazole Nausea Only 07/29/2014  . Nitrofurantoin macrocrystal Nausea And Vomiting 01/31/2015  . Sulfamethoxazole-trimethoprim Nausea Only 07/29/2014  . Cephalexin Rash 12/28/2010  . Clindamycin hcl Rash 01/31/2015  . Doxycycline Swelling and Rash 12/28/2010  . Erythromycin Rash 11/15/2011    Vitals: BP (!) 148/75 (BP Location: Left Arm, Patient Position: Sitting)   Pulse 63   Ht 5\' 6"  (1.676 m)   Wt 224 lb (101.6 kg) Comment: pt reported  BMI 36.15 kg/m  Last Weight:  Wt Readings from Last 1 Encounters:  10/16/19 224 lb (101.6 kg)   Last Height:   Ht Readings from Last 1 Encounters:  10/16/19 5\' 6"  (1.676 m)    Physical exam: Exam: Gen: NAD, conversant, well nourised, obese, well  groomed                     CV: RRR, no MRG. No Carotid Bruits. No peripheral edema, warm, nontender Eyes: Conjunctivae clear without exudates or hemorrhage  Neuro: Detailed Neurologic Exam  Speech:    Speech is normal; fluent and spontaneous with normal comprehension.  Cognition:    The patient is oriented to person, place, and time;     recent and remote memory intact;     language fluent;     normal attention, concentration,     fund of knowledge Cranial Nerves:    The pupils are equal, round, and reactive to light. The fundi are normal and spontaneous venous pulsations are present. Visual fields are full to finger confrontation. Extraocular movements are intact. Trigeminal sensation is intact and the muscles of mastication are normal. The face is symmetric. The palate elevates in the midline. Hearing intact. Voice is normal. Shoulder shrug is normal. The tongue has normal motion without fasciculations.   Coordination:    Normal finger to nose and heel to shin. Normal rapid alternating movements.  Gait:    Heel-toe and tandem gait are normal.   Motor Observation:    No asymmetry, no atrophy, and no involuntary movements noted. Tone:    Normal muscle tone.    Posture:    Posture is normal. normal erect    Strength: Right biceps,triceps 4+/5 otherwise.strength is V/V in the upper and lower limbs.      Sensation: intact to LT     +Hoffmans left  Assessment/Plan:  Sonya Lopez is a lovely 64 y.o. female here as requested by Steele Sizer, MD for migraines. PMHx migraines, lumbar and cervical  radiculopathy, obesity, kidney stones, fibromyalgia, atrial fibrillation on long-term anticoagulation, diet-controlled diabetes 2, depression, breast neoplasm, anxiety.   - She has been having severe neck pain and radiation. MRi showed broad based disc protrusion c5-c6 causing severe right and moderate left foraminal stenosis, c6-c7 central right paracentral disc protrusion  causing right ventral spinal cord abutment (reviewed CD images she brought today). I discussed surgery and agree she needs ACDF. She is scheduled next week.  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 We reviewed her MRI lumbar spine images with arthtritic changes and mild forminal stenosis but no frank nerve pinching. Still if symptoms worsen she may consider ESI and if severe would consider surgical intervention because of some broad disk bulges l4/l5 and l5/s1  - No afib since the ablation. She is going to come off the Eliquis 3 days prior to surgery. There is risk of stroke during those 3 days but much less due to treatment of afib. Discussed.  - As far as migraines she is doing better, Ajovy alone is helping and the ones she does have a few and they are very responsive to acute medications. Will hold off on botox. Will give her 6 months samples of Ajovy and see if we can get insurance to approve Ajovy if not we will try Emgality.   - For her cervical myofascial pain syndrome:  Dry needling of the cervical muscles. [employer]. DRY NEEDLING: Patient has cervical myofascial pain syndrome, cervicalgia causing disability, pain, decreased range of motion, and contributing to migraines. Referred in the past, hold off for now.  -Hand pain: likely due to cervical disc disease (surgery next week) but if symptoms continue and she has CTS then may need emg/ncs in the future, may be overuse, consider wrist splints when typing a lot or with symptoms and will follow clinically.   -Patient being treated for sleep apnea   No orders of the defined types were placed in this encounter.  Meds ordered this encounter  Medications  . Fremanezumab-vfrm (AJOVY) 225 MG/1.5ML SOAJ    Sig: Inject 225 mg into the skin every 30 (thirty) days.    Dispense:  1.5 mL    Refill:  11  .  Fremanezumab-vfrm (AJOVY) 225 MG/1.5ML SOAJ    Sig: Inject 225 mg into the skin every 30 (thirty) days.    Dispense:  9 mL    Refill:  11    Patient has copay card; she can have medication regardless of insurance approval or copay amount.  . rizatriptan (MAXALT-MLT) 10 MG disintegrating tablet    Sig: Take 1 tablet (10 mg total) by mouth as needed for migraine. May repeat in 2 hours if needed    Dispense:  9 tablet    Refill:  11    Cc: Steele Sizer, MD     I spent over 60 minutes of face-to-face and non-face-to-face time with patient on the  1. Cervical spondylosis with radiculopathy   2. Lumbar degenerative disc disease   3. Sciatica, right side   4. Chronic migraine without aura, with intractable migraine, so stated, with status migrainosus    diagnosis.  This included previsit chart review, lab review, study review, order entry, electronic health record documentation, patient education on the different diagnostic and therapeutic options, counseling and coordination of care, risks and benefits of management, compliance, or risk factor reduction   Sarina Ill, MD  [facility] [address] [place], [place] [address]  Phone [phone] Fax [phone]

## 2019-10-16 ENCOUNTER — Other Ambulatory Visit: Payer: Self-pay

## 2019-10-16 ENCOUNTER — Encounter: Payer: Self-pay | Admitting: Neurology

## 2019-10-16 ENCOUNTER — Ambulatory Visit (INDEPENDENT_AMBULATORY_CARE_PROVIDER_SITE_OTHER): Payer: Medicare Other | Admitting: Neurology

## 2019-10-16 VITALS — BP 148/75 | HR 63 | Ht 66.0 in | Wt 224.0 lb

## 2019-10-16 DIAGNOSIS — G43711 Chronic migraine without aura, intractable, with status migrainosus: Secondary | ICD-10-CM | POA: Diagnosis not present

## 2019-10-16 DIAGNOSIS — M5431 Sciatica, right side: Secondary | ICD-10-CM | POA: Diagnosis not present

## 2019-10-16 DIAGNOSIS — M4722 Other spondylosis with radiculopathy, cervical region: Secondary | ICD-10-CM

## 2019-10-16 DIAGNOSIS — M5136 Other intervertebral disc degeneration, lumbar region: Secondary | ICD-10-CM

## 2019-10-16 MED ORDER — RIZATRIPTAN BENZOATE 10 MG PO TBDP: 10.0000 mg | ORAL_TABLET | ORAL | 11 refills | Status: DC | PRN

## 2019-10-16 MED ORDER — AJOVY 225 MG/1.5ML ~~LOC~~ SOAJ: 225.0000 mg | SUBCUTANEOUS | 11 refills | Status: DC

## 2019-10-16 NOTE — Patient Instructions (Addendum)
- She has been having severe neck pain and radiation. MRi showed broad based disc protrusion c5-c6 causing severe right and moderate left foraminal stenosis, c6-c7 central right paracentral disc protrusion causing right ventral spinal cord abutment. I discussed surgery and agree she needs ACDF. She is scheduled next week.  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 We reviewed her MRI lumbar spine images with arthtritic changes and mild forminal stenosis but no frank nerve pinching. Still if symptoms worsen she may consider ESI and if severe would consider surgical intervention because of some broad disk bulges l4/l5 and l5/s1  - No afib since the ablation. She is going to come off the Eliquis 3 days prior to surgery. There is risk of stroke during those 3 days but much less due to treatment of afib. Discussed.  - As far as migraines she is doing better, Ajovy alone is helping and the ones she does have a few and they are very responsive to acute medications. Will hold off on botox. Continue Rizatriptan acutely.  Will give her 6 months samples of Ajovy and see if we can get insurance to approve Ajovy if not we will try Emgality.   - For her cervical myofascial pain syndrome:  Dry needling of the cervical muscles. [employer]. DRY NEEDLING: Patient has cervical myofascial pain syndrome, cervicalgia causing disability, pain, decreased range of motion, and contributing to migraines. Referred in the past, hold off for now.  -Hand pain: likely due to cervical disc disease (surgery next week) but if symptoms continue and she has CTS then may need emg/ncs in the future, may be overuse, consider wrist splints when typing a lot or with symptoms and will follow clinically.   -Patient being treated for sleep apnea  Galcanezumab injection What is this medicine? GALCANEZUMAB  (gal ka NEZ ue mab) is used to prevent migraines and treat cluster headaches. This medicine may be used for other purposes; ask your health care provider or pharmacist if you have questions. COMMON BRAND NAME(S): Emgality What should I tell my health care provider before I take this medicine? They need to know if you have any of these conditions:  an unusual or allergic reaction to galcanezumab, other medicines, foods, dyes, or preservatives  pregnant or trying to get pregnant  breast-feeding How should I use this medicine? This medicine is for injection under the skin. You will be taught how to prepare and give this medicine. Use exactly as directed. Take your medicine at regular intervals. Do not take your medicine more often than directed. It is important that you put your used needles and syringes in a special sharps container. Do not put them in a trash can. If you do not have a sharps container, call your pharmacist or healthcare provider to get one. Talk to your pediatrician regarding the use of this medicine in children. Special care may be needed. Overdosage: If you think you have taken too much of this medicine contact a poison control center or emergency room at once. NOTE: This medicine is only for you. Do not share this medicine with others. What if I miss a dose? If you miss a dose, take it as soon as you can. If it is almost time for your next dose, take only that dose. Do not take double or extra doses. What may interact with this  medicine? Interactions are not expected. This list may not describe all possible interactions. Give your health care provider a list of all the medicines, herbs, non-prescription drugs, or dietary supplements you use. Also tell them if you smoke, drink alcohol, or use illegal drugs. Some items may interact with your medicine. What should I watch for while using this medicine? Tell your doctor or healthcare professional if your symptoms do not start to  get better or if they get worse. What side effects may I notice from receiving this medicine? Side effects that you should report to your doctor or health care professional as soon as possible:  allergic reactions like skin rash, itching or hives, swelling of the face, lips, or tongue Side effects that usually do not require medical attention (report these to your doctor or health care professional if they continue or are bothersome):  pain, redness, or irritation at site where injected This list may not describe all possible side effects. Call your doctor for medical advice about side effects. You may report side effects to FDA at 1-800-FDA-1088. Where should I keep my medicine? Keep out of the reach of children. You will be instructed on how to store this medicine. Throw away any unused medicine after the expiration date on the label. NOTE: This sheet is a summary. It may not cover all possible information. If you have questions about this medicine, talk to your doctor, pharmacist, or health care provider.  2020 Elsevier/Gold Standard (2017-07-06 12:03:23)

## 2019-10-17 ENCOUNTER — Encounter
Admission: RE | Admit: 2019-10-17 | Discharge: 2019-10-17 | Disposition: A | Discharge status: Home / Self Care | Payer: Medicare Other | Source: Ambulatory Visit | Attending: Neurosurgery | Admitting: Neurosurgery

## 2019-10-17 ENCOUNTER — Other Ambulatory Visit: Payer: Self-pay

## 2019-10-17 DIAGNOSIS — F329 Major depressive disorder, single episode, unspecified: Secondary | ICD-10-CM | POA: Insufficient documentation

## 2019-10-17 DIAGNOSIS — K219 Gastro-esophageal reflux disease without esophagitis: Secondary | ICD-10-CM | POA: Diagnosis not present

## 2019-10-17 DIAGNOSIS — M797 Fibromyalgia: Secondary | ICD-10-CM | POA: Diagnosis not present

## 2019-10-17 DIAGNOSIS — F419 Anxiety disorder, unspecified: Secondary | ICD-10-CM | POA: Insufficient documentation

## 2019-10-17 DIAGNOSIS — I4891 Unspecified atrial fibrillation: Secondary | ICD-10-CM | POA: Insufficient documentation

## 2019-10-17 DIAGNOSIS — Z79899 Other long term (current) drug therapy: Secondary | ICD-10-CM | POA: Diagnosis not present

## 2019-10-17 DIAGNOSIS — I4892 Unspecified atrial flutter: Secondary | ICD-10-CM | POA: Insufficient documentation

## 2019-10-17 DIAGNOSIS — G4733 Obstructive sleep apnea (adult) (pediatric): Secondary | ICD-10-CM | POA: Diagnosis not present

## 2019-10-17 DIAGNOSIS — Z01812 Encounter for preprocedural laboratory examination: Secondary | ICD-10-CM | POA: Insufficient documentation

## 2019-10-17 DIAGNOSIS — M5412 Radiculopathy, cervical region: Secondary | ICD-10-CM | POA: Insufficient documentation

## 2019-10-17 DIAGNOSIS — Z7901 Long term (current) use of anticoagulants: Secondary | ICD-10-CM | POA: Diagnosis not present

## 2019-10-17 HISTORY — DX: Personal history of urinary calculi: Z87.442

## 2019-10-17 LAB — URINALYSIS, ROUTINE W REFLEX MICROSCOPIC
Bilirubin Urine: NEGATIVE
Glucose, UA: NEGATIVE mg/dL
Hgb urine dipstick: NEGATIVE
Ketones, ur: NEGATIVE mg/dL
Leukocytes,Ua: NEGATIVE
Nitrite: NEGATIVE
Protein, ur: NEGATIVE mg/dL
Specific Gravity, Urine: 1.006 (ref 1.005–1.030)
pH: 5 (ref 5.0–8.0)

## 2019-10-17 LAB — APTT: aPTT: 30 seconds (ref 24–36)

## 2019-10-17 LAB — BASIC METABOLIC PANEL
Anion gap: 8 (ref 5–15)
BUN: 17 mg/dL (ref 8–23)
CO2: 28 mmol/L (ref 22–32)
Calcium: 9.1 mg/dL (ref 8.9–10.3)
Chloride: 103 mmol/L (ref 98–111)
Creatinine, Ser: 0.98 mg/dL (ref 0.44–1.00)
GFR calc Af Amer: >60 mL/min (ref >60)
GFR calc non Af Amer: >60 mL/min (ref >60)
Glucose, Bld: 91 mg/dL (ref 70–99)
Potassium: 4 mmol/L (ref 3.5–5.1)
Sodium: 139 mmol/L (ref 135–145)

## 2019-10-17 LAB — TYPE AND SCREEN
ABO/RH(D): O POS
Antibody Screen: NEGATIVE

## 2019-10-17 LAB — CBC
HCT: 35.6 % — ABNORMAL LOW (ref 36.0–46.0)
Hemoglobin: 11.5 g/dL — ABNORMAL LOW (ref 12.0–15.0)
MCH: 32 pg (ref 26.0–34.0)
MCHC: 32.3 g/dL (ref 30.0–36.0)
MCV: 99.2 fL (ref 80.0–100.0)
Platelets: 304 10*3/uL (ref 150–400)
RBC: 3.59 MIL/uL — ABNORMAL LOW (ref 3.87–5.11)
RDW: 15.7 % — ABNORMAL HIGH (ref 11.5–15.5)
WBC: 6.5 10*3/uL (ref 4.0–10.5)
nRBC: 0 % (ref 0.0–0.2)

## 2019-10-17 LAB — PROTIME-INR
INR: 1 (ref 0.8–1.2)
Prothrombin Time: 12.4 seconds (ref 11.4–15.2)

## 2019-10-17 LAB — SURGICAL PCR SCREEN
MRSA, PCR: NEGATIVE
Staphylococcus aureus: NEGATIVE

## 2019-10-17 NOTE — Patient Instructions (Addendum)
Your procedure is scheduled on: 10-24-19 [facility] Report to Same Day Surgery 2nd floor medical mall [facility] Entrance-take elevator on left to 2nd floor.  Check in with surgery information desk.) To find out your arrival time please call [phone] between 1PM - 3PM on 10-23-19 TUESDAY  Remember: Instructions that are not followed completely may result in serious medical risk, up to and including death, or upon the discretion of your surgeon and anesthesiologist your surgery may need to be rescheduled.    _x___ 1. Do not eat food after midnight the night before your procedure. NO GUM OR CANDY AFTER MIDNIGHT. You may drink clear liquids up to 2 hours before you are scheduled to arrive at the hospital for your procedure.  Do not drink clear liquids within 2 hours of your scheduled arrival to the hospital.  Clear liquids include  --Water or Apple juice without pulp  --Gatorade  --Black Coffee or Clear Tea (No milk, no creamers, do not add anything to the coffee or Tea     __x__ 2. No Alcohol for 24 hours before or after surgery.   __x__3. No Smoking or e-cigarettes for 24 prior to surgery.  Do not use any chewable tobacco products for at least 6 hour prior to surgery   ____  4. Bring all medications with you on the day of surgery if instructed.    __x__ 5. Notify your doctor if there is any change in your medical condition     (cold, fever, infections).    x___6. On the morning of surgery brush your teeth with toothpaste and water.  You may rinse your mouth with mouth wash if you wish.  Do not swallow any toothpaste or mouthwash.   Do not wear jewelry, make-up, hairpins, clips or nail polish.  Do not wear lotions, powders, or perfumes.  Do not shave 48 hours prior to surgery. Men may shave face and neck.  Do not bring valuables to the hospital.    [facility] is not responsible for any belongings or valuables.               Contacts, dentures or bridgework may not be worn into  surgery.  Leave your suitcase in the car. After surgery it may be brought to your room.  For patients admitted to the hospital, discharge time is determined by your  treatment team.  _  Patients discharged the day of surgery will not be allowed to drive home.  You will need someone to drive you home and stay with you the night of your procedure.    Please read over the following fact sheets that you were given:   [facility] Preparing for Surgery and MRSA Information   _x___ TAKE THE FOLLOWING MEDICATION THE MORNING OF SURGERY WITH A SMALL SIP OF WATER. These include:  1. CYMBALTA (DULOXETINE)  2. MAGNESIUM OXIDE  3. PRILOSEC (OMEPRAZOLE)  4. TAKE AN EXTRA PRILOSEC THE NIGHT BEFORE YOUR SURGERY  5. BRING YOUR AMIODARONE AND PROPRANOLOL BOTTLES TO [facility]   6.  ____Fleets enema or Magnesium Citrate as directed.   _x___ Use CHG Soap as directed on instruction sheet   ____ Use inhalers on the day of surgery and bring to hospital day of surgery  ____ Stop Metformin and Janumet 2 days prior to surgery.    ____ Take 1/2 of usual insulin dose the night before surgery and none on the morning surgery.   _x___ Follow recommendations from Cardiologist, Pulmonologist or PCP regarding  stopping Aspirin, Coumadin, Plavix ,Eliquis, Effient, or Pradaxa, and Pletal-LAST DOSE OF ELIQUIS ON 10-20-19 SATURDAY  X____Stop Anti-inflammatories such as Advil, Aleve, Ibuprofen, Motrin, Naproxen, Naprosyn, Goodies powders or aspirin products NOW-OK to take Tylenol OR TYLENOL #3 IF NEEDED   ____ Stop supplements until after surgery.     ____ Bring C-Pap to the hospital.

## 2019-10-17 NOTE — Pre-Procedure Instructions (Signed)
Primary Cardiologist: No primary care provider on file.  Chart reviewed as part of pre-operative protocol coverage. Patient was contacted 08/17/2019 in reference to pre-operative risk assessment for pending surgery as outlined below.  Sonya Lopez was last seen on 06/12/19 by Dr. Rockey Situ. She has history of DM, GERD, atrial fib and flutter, prior ablation 02/2019, anxiety, breast neoplasm, fibromyalgia, OSA. She affirms she's been doing great ever since her ablation without any significant cardiac symptoms - had one episode of AF a while back after ablation but none since. Therefore, based on ACC/AHA guidelines, the patient would be at acceptable risk for the planned procedure without further cardiovascular testing.   Will route to pharm for input on Eliquis.  Sonya Pitter, PA-C 08/17/2019, 1:43 PM           Electronically signed by Sonya Pitter, PA-C at 08/17/2019 1:46 PM  Telephone on 08/17/2019   Telephone on 08/17/2019     Detailed Report

## 2019-10-17 NOTE — Pre-Procedure Instructions (Signed)
[name], MD  Physician  Cardiology  Progress Notes    Signed  Encounter Date:  06/12/2019          Signed      Expand All Collapse All  Show:Clear all [x] Manual[x] Template[x] Copied  Added by: [x] [name], MD  [] Hover for details      Date:  06/12/2019   ID:  Sonya Lopez, DOB [birth year], MRN [id number]  Patient Location:  [address] [place] [address]   Provider location:   [facility], [place] office  PCP:  Steele Sizer, MD     Cardiologist:  Patsy [place]      Chief Complaint  Patient presents with  . OTHER    6 month f/u no complaints today. Meds reviewed verbally with pt.    History of Present Illness:    Sonya Lopez is a 64 y.o. female  past medical history of Diabetes  GERD hospital admission in 04/16/2011 for atrial flutter with RVR.  CT coronary calcium score zero 09/2015 Father had significant history of coronary artery disease but was a heavy smoker She presents for routine followup of her atrial fibrillation and flutter Ablation 02/2019  Underwent ablation at the end of January 2021 Has done very well since that time Amiodarone weaned off, now taking metoprolol succinate 25 daily  Continues on eliquis 5 mg bid for CHA2DS2VAScof 2.   Denies any tachycardia concerning for arrhythmia past several months   has a watch and able to print up the telemetry strips clearly documenting atrial fibrillation  Some fatigue, heart rate slow, rate low 50s at home  EKG personally reviewed by myself on todays visit Shows sinus bradycardia rate 55 bpm no significant ST-T wave changes    Prior CV studies:   The following studies were reviewed today:       Past Medical History:  Diagnosis Date  . Allergy   . Anxiety    situational anxiety  . Arrhythmia    A-fib/A-flutter  . Atrial flutter ([place])   . 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  MammoSite  . Cancer [facility]) 2017   Left- DCIS  . Depression   . Diet-controlled type 2 diabetes mellitus ([place])   . Difficult intubation 2009   [place] had to use  'boogee"  . Female cystocele 01/05/2011  . Fibromyalgia   . GERD (gastroesophageal reflux disease)   . Headache(784.0)   . Kidney stones   . Obesity   . OSA (obstructive sleep apnea) 03/27/2019  . Personal history of radiation therapy 2017   LEFT lumpectomy  . PONV (postoperative nausea and vomiting)    h/o difficult intubation and anes had to use "boogee"  . Vitreous detachment of right eye    sees floaters        Past Surgical History:  Procedure Laterality Date  . ABDOMINAL HYSTERECTOMY  2013  . ANTERIOR AND POSTERIOR REPAIR  11/30/2011   Procedure: ANTERIOR (CYSTOCELE) AND POSTERIOR REPAIR (RECTOCELE);  Surgeon: Reece Packer, MD;  Location: [facility];  Service: Urology;  Laterality: N/A;  with cysto  . ATRIAL FIBRILLATION ABLATION N/A 03/01/2019   Procedure: ATRIAL FIBRILLATION ABLATION;  Surgeon: Constance Haw, MD;  Location: [place] [facility];  Service: Cardiovascular;  Laterality: N/A;  . BLADDER SUSPENSION  11/30/2011   Procedure: [place] PROCEDURE;  Surgeon: Reece Packer, MD;  Location: [facility];  Service: Urology;  Laterality: N/A;  graft 10x6  . BREAST BIOPSY Right 2007   negative core  . BREAST BIOPSY Left 09/16/2015   DCIS  . BREAST LUMPECTOMY Left 09/30/2015   DCIS clear margins and rad tx/HIGH GRADE DUCTAL CARCINOMA IN SITU, COMEDO TYPE  . BREAST LUMPECTOMY WITH NEEDLE LOCALIZATION Left 09/30/2015   DCIS excision, MammoSite radiation;  Surgeon: Robert Bellow, MD;  Location: [facility];  Service: General;  Laterality: Left;  . CATARACT EXTRACTION Left 01/2019  . CESAREAN SECTION     x 1  . CHOLECYSTECTOMY  2005  . COLONOSCOPY  2009  . CYSTOSCOPY  11/30/2011   Procedure: CYSTOSCOPY;   Surgeon: Reece Packer, MD;  Location: [facility];  Service: Urology;;  . Brigitte Pulse AND CURETTAGE OF UTERUS  05/05/2007  . GALLBLADDER SURGERY  2004  . TONSILLECTOMY  1961  . VAGINAL HYSTERECTOMY  11/30/2011   Procedure: HYSTERECTOMY VAGINAL;  Surgeon: Emily Filbert, MD;  Location: [facility];  Service: Gynecology;  Laterality: N/A;     Active Medications      Current Meds  Medication Sig  . acetaminophen (TYLENOL) 650 MG CR tablet Take 1,300 mg by mouth as needed for pain.   Marland Kitchen apixaban (ELIQUIS) 5 MG TABS tablet Take 1 tablet (5 mg total) by mouth 2 (two) times daily.  . Cholecalciferol (PA VITAMIN D-3 GUMMY PO) Takes 2 gummies daily  . DULoxetine (CYMBALTA) 20 MG capsule Take 2 capsules (40 mg total) by mouth daily.  Marland Kitchen FINACEA 15 % FOAM Apply 1 application topically daily as needed (rosacea).   . Fremanezumab-vfrm (AJOVY) 225 MG/1.5ML SOSY Inject 225 mg into the skin every 30 (thirty) days.  Marland Kitchen gabapentin (NEURONTIN) 100 MG capsule TAKE 1 CAPSULE BY MOUTH TWICE DAILY  . magnesium oxide (MAG-OX) 400 MG tablet Take 400 mg by mouth daily. In the morning  . metoprolol succinate (TOPROL-XL) 25 MG 24 hr tablet TAKE ONE TABLET EVERY DAY  . naratriptan (AMERGE) 2.5 MG tablet Take 1 tablet (2.5 mg total) by mouth See admin instructions. ONSET OF HEADACHE. MAY REPEAT AFTER 4 HOURSIF RETURNS OR DOES NOT RESOLVE MAX OF 2 TABS PER DAY.                  Dose:    2.5 mg  . omeprazole (PRILOSEC) 20 MG capsule Take 1 tablet by mouth twice a day for 1 month then reduce back to 1 tablet daily  . Probiotic Product (PROBIOTIC PO) Take 1 capsule by mouth daily.   . propranolol (INDERAL) 10 MG tablet TAKE ONE TABLET EVERY DAY WHEN TAKING EXTRA FLECAINIDE FOR BREAKTHROUGH A-FIB (Patient taking differently: Take 10 mg by mouth daily as needed (BREAKTHROUGH A-FIB). )  . traZODone (DESYREL) 50 MG tablet Take 0.5-1 tablets (25-50 mg total) by mouth at bedtime as needed for sleep.  . vitamin  B-12 (CYANOCOBALAMIN) 1000 MCG tablet Take 1,000 mcg by mouth every 7 (seven) days.  . [DISCONTINUED] ELIQUIS 5 MG TABS tablet TAKE 1 TABLET BY MOUTH TWICE A DAY       Allergies:   Adhesive [tape], Macrodantin [nitrofurantoin], Metronidazole, Nitrofurantoin macrocrystal, Sulfamethoxazole-trimethoprim, Cephalexin, Clindamycin hcl, Doxycycline, and Erythromycin   Social History        Tobacco Use  . Smoking status: Never Smoker  . Smokeless tobacco: Never Used  Substance Use Topics  . Alcohol use: Not Currently    Alcohol/week: 0.0 standard drinks    Comment: Due to AF  . Drug use: No  Family Hx: [name] family history includes Asthma in her daughter, daughter, and daughter; Colitis in her daughter; Diabetes in her brother and mother; Fibromyalgia in her daughter; Hearing loss in her mother; Heart disease in her father and paternal grandfather; Hypertension in her brother and mother; Interstitial cystitis in her daughter; Migraines in her daughter; Thyroid cancer (age of onset: 35) in her daughter. There is no history of Breast cancer.  ROS:   Please see the history of present illness.    Review of Systems  Constitutional: Negative.   HENT: Negative.   Respiratory: Negative.   Cardiovascular: Negative.   Gastrointestinal: Negative.   Musculoskeletal: Negative.   Neurological: Negative.   Psychiatric/Behavioral: Negative.   All other systems reviewed and are negative.    Labs/Other Tests and Data Reviewed:    Recent Labs: 02/20/2019: Hemoglobin 13.2; Platelets 287 03/29/2019: ALT 20; BUN 20; Creatinine, Ser 0.92; Potassium 4.4; Sodium 141; TSH 2.958   Recent Lipid Panel Labs (Brief)       Lab Results  Component Value Date/Time   CHOL 186 03/02/2018 10:00 AM   CHOL 209 (H) 06/04/2015 07:53 AM   CHOL 164 04/17/2011 05:07 AM   TRIG 87 03/02/2018 10:00 AM   TRIG 83 04/17/2011 05:07 AM   HDL 60 03/02/2018 10:00 AM   HDL 58 06/04/2015 07:53 AM    HDL 43 04/17/2011 05:07 AM   CHOLHDL 3.1 03/02/2018 10:00 AM   LDLCALC 108 (H) 03/02/2018 10:00 AM   LDLCALC 104 (H) 04/17/2011 05:07 AM         Wt Readings from Last 3 Encounters:  06/12/19 219 lb 2 oz (99.4 kg)  06/05/19 217 lb (98.4 kg)  04/20/19 216 lb (98 kg)     Exam:    BP 120/70 (BP Location: Left Arm, Patient Position: Sitting, Cuff Size: Normal)   Pulse (!) 55   Ht 5\' 6"  (1.676 m)   Wt 219 lb 2 oz (99.4 kg)   SpO2 98%   BMI 35.37 kg/m   Constitutional:  oriented to person, place, and time. No distress.  HENT:  Head: Grossly normal Eyes:  no discharge. No scleral icterus.  Neck: No JVD, no carotid bruits  Cardiovascular: Regular rate and rhythm, no murmurs appreciated Pulmonary/Chest: Clear to auscultation bilaterally, no wheezes or rails Abdominal: Soft.  no distension.  no tenderness.  Musculoskeletal: Normal range of motion Neurological:  normal muscle tone. Coordination normal. No atrophy Skin: Skin warm and dry Psychiatric: normal affect, pleasant  ASSESSMENT & PLAN:    Atrialfibrillation/flutter, unspecified type ([place]) Underwent ablation maintaining normal sinus rhythm Sinus bradycardia with some fatigue on metoprolol succinate 25 daily Suggested she could try to take 12.5 mg daily She has extra metoprolol and antiarrhythmics for any recurrent arrhythmia  History of syncope Suspect secondary to orthostasis in the past No further episodes, no further work-up  Hyperlipidemia Lifestyle modification recommended  Notes reviewed from electrophysiology,  hospitalization reviewed Detailing ablation  Total encounter time more than 25 minutes  Greater than 50% was spent in counseling and coordination of care with the patient   Disposition: Follow-up in 12 months   Signed, Ida Rogue, MD  06/12/2019 10:48 AM    [place] Office [address], [place], [place] [address]           Electronically signed by [name], MD at 06/12/2019 10:50 AM  Office Visit on 06/12/2019   Office Visit on 06/12/2019     Detailed  Report    Note shared with patient

## 2019-10-17 NOTE — Progress Notes (Signed)
[facility] Perioperative Services  Pre-Admission/Anesthesia Testing Clinical Review  Date: 10/17/19  Patient Demographics:  Name: Sonya Lopez DOB:   Apr 30, 1955 MRN:   [id number]  Planned Surgical Procedure(s):    Case: [id number] Date/Time: 10/24/19 0937   Procedure: ANTERIOR CERVICAL DECOMPRESSION/DISCECTOMY FUSION 2 LEVELS C5-7 (N/A )   Anesthesia type: General   Pre-op diagnosis: m54.12 cervical radiculopathy   Location: [facility] OR ROOM 03 / [place] [facility] FOR [facility]   Surgeons: Meade Maw, MD      NOTE: Available PAT nursing documentation and vital signs have been reviewed. Clinical nursing staff has updated patient's PMH/PSHx, current medication list, and drug allergies/intolerances to ensure comprehensive history available to assist in medical decision making as it pertains to the aforementioned surgical procedure and anticipated anesthetic course.   Clinical Discussion:  Sonya Lopez is a 64 y.o. female who is submitted for pre-surgical anesthesia review and clearance prior to her undergoing the above procedure. Patient has never been a smoker. Pertinent PMH includes: atrial fib/flutter, T2DM (lifesyle controlled), HLD, OSAH (requires nocturnal PAP therapy), GERD (on daily PPI), fibromyalgia, migraines, breast cancer, low back pain, anxiety, depression.  Patient is followed by cardiology Rockey Situ, MD). She was last seen in the cardiology clinic on 06/12/2019; notes reviewed. Patient doing well overall. No angina or anginal equivalent symptoms. No recent tachycardia in the setting of known PAF. She is s/p ablation in 02/2019 and has done very well since. Patient complained on fatigue with some mind bradycardia; HR in the 50s at home. CHA2DS2-VASc Score = 2. Patient apixaban BID and is compliant. CT calcium score zero in 2017. ECG in clinic showed SB at a rate of 55 with no significant ST segment or T wave changes. Exercise stress test in  09/2017 was normal (see results below). TTE in 04/2011 revealed an LVEF of > 55%.  Patient is scheduled to undergo a ANTERIOR CERVICAL DECOMPRESSION/DISCECTOMY FUSION 2 LEVELS C5-7 (N/A ) on 10/24/2019. Given her PMH and current anticoagulation use, presurgical cardiac clearance was sought. Functional capacity documented as being > 4 METS. Per cardiology, "based on ACC/AHA guidelines, Sonya Lopez would be at acceptable risk for the planned procedure without further cardiovascular testing". Patient to hold apixaban 3 days prior to her procedure and resume as soon as safe post operatively.  She reports previous perioperative complications with anesthesia. She has a history of (+) PONV and it documented that she has been difficult to intubate in the past. She underwent a general anesthetic course at [place] (ASA III) in 02/2019 with no documented complications.   Vitals with BMI 10/17/2019 10/16/2019 09/11/2019  Height 5\' 6"  5\' 6"  5\' 6"   Weight 232 lbs 2 oz 224 lbs 224 lbs  BMI 37.49 93.71 69.67  Systolic 893 810 175  Diastolic 63 75 62  Pulse 63 63 66    Providers/Specialists:   NOTE: Primary physician provider listed below. Patient may have been seen by APP or partner within same practice.   PROVIDER ROLE LAST Dola Factor, MD Neurosurgery 10/04/2019  Steele Sizer, MD Primary Care Provider 09/11/2019  Ida Rogue, MD Cardiology 06/12/2019   Allergies:  Adhesive [tape], Macrodantin [nitrofurantoin], Metronidazole, Nitrofurantoin macrocrystal, Sulfamethoxazole-trimethoprim, Cephalexin, Clindamycin hcl, Doxycycline, and Erythromycin  Current Home Medications:    acetaminophen (TYLENOL) 650 MG CR tablet   acetaminophen-codeine (TYLENOL #3) 300-30 MG tablet   amiodarone (PACERONE) 200 MG tablet   apixaban (ELIQUIS) 5 MG TABS tablet   Cholecalciferol (PA VITAMIN D-3 GUMMY PO)  Cyanocobalamin (VITAMIN B-12 PO)   DULoxetine (CYMBALTA) 20 MG capsule   FINACEA 15 %  FOAM   Fremanezumab-vfrm (AJOVY) 225 MG/1.5ML SOAJ   gabapentin (NEURONTIN) 300 MG capsule   ibuprofen (ADVIL) 200 MG tablet   magnesium oxide (MAG-OX) 400 MG tablet   Menthol-Methyl Salicylate (SALONPAS PAIN RELIEF PATCH EX)   methocarbamol (ROBAXIN) 750 MG tablet   metoprolol succinate (TOPROL-XL) 25 MG 24 hr tablet   naratriptan (AMERGE) 2.5 MG tablet   omeprazole (PRILOSEC) 20 MG capsule   Probiotic Product (PROBIOTIC PO)   propranolol (INDERAL) 10 MG tablet   rizatriptan (MAXALT-MLT) 10 MG disintegrating tablet   trolamine salicylate (ASPERCREME) 10 % cream   Fremanezumab-vfrm (AJOVY) 225 MG/1.5ML SOAJ   Fremanezumab-vfrm (AJOVY) 225 MG/1.5ML SOSY   No current facility-administered medications for this encounter.   History:   Past Medical History:  Diagnosis Date   Allergy    Anxiety    situational anxiety   Arrhythmia    A-fib/A-flutter   Atrial flutter (HCC)    Breast neoplasm, Tis (DCIS), 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  MammoSite   Cancer [facility]) 2017   Left- DCIS   Depression    Difficult intubation 2009   [place] had to use  'boogee" DURING GALLBLADDER SURGERY ONLY-NO PROBLEMS SINCE   Female cystocele 01/05/2011   Fibromyalgia    GERD (gastroesophageal reflux disease)    Headache(784.0)    MIGRAINES   History of kidney stones    Obesity    OSA (obstructive sleep apnea) 03/27/2019   USE CPAP   Personal history of radiation therapy 2017   LEFT lumpectomy   PONV (postoperative nausea and vomiting)    h/o difficult intubation and anes had to use "boogee"   Vitreous detachment of right eye    sees floaters   Past Surgical History:  Procedure Laterality Date   ABDOMINAL HYSTERECTOMY  2013   ANTERIOR AND POSTERIOR REPAIR  11/30/2011   Procedure: ANTERIOR (CYSTOCELE) AND POSTERIOR REPAIR (RECTOCELE);  Surgeon: Reece Packer, MD;  Location: [place] [facility];  Service: Urology;  Laterality: N/A;  with  cysto   ATRIAL FIBRILLATION ABLATION N/A 03/01/2019   Procedure: ATRIAL FIBRILLATION ABLATION;  Surgeon: Constance Haw, MD;  Location: [place] CV LAB;  Service: Cardiovascular;  Laterality: N/A;   BLADDER SUSPENSION  11/30/2011   Procedure: [place] PROCEDURE;  Surgeon: Reece Packer, MD;  Location: [place] [facility];  Service: Urology;  Laterality: N/A;  graft 10x6   BREAST BIOPSY Right 2007   negative core   BREAST BIOPSY Left 09/16/2015   DCIS   BREAST LUMPECTOMY Left 09/30/2015   DCIS clear margins and rad tx/HIGH GRADE DUCTAL CARCINOMA IN SITU, COMEDO TYPE   BREAST LUMPECTOMY WITH NEEDLE LOCALIZATION Left 09/30/2015   DCIS excision, MammoSite radiation;  Surgeon: Robert Bellow, MD;  Location: [facility] ORS;  Service: General;  Laterality: Left;   CATARACT EXTRACTION Left 01/2019   CESAREAN SECTION     x 1   CHOLECYSTECTOMY  2005   COLONOSCOPY  2009   CYSTOSCOPY  11/30/2011   Procedure: CYSTOSCOPY;  Surgeon: Reece Packer, MD;  Location: [place] [facility];  Service: Urology;;   [place] OF UTERUS  05/05/2007   GALLBLADDER SURGERY  2004   TONSILLECTOMY  1961   VAGINAL HYSTERECTOMY  11/30/2011   Procedure: HYSTERECTOMY VAGINAL;  Surgeon: Emily Filbert, MD;  Location: [place] [facility];  Service: Gynecology;  Laterality: N/A;  Family History  Problem Relation Age of Onset   Diabetes Mother    Hearing loss Mother    Hypertension Mother    Heart disease Father    Heart disease Paternal Grandfather    Asthma Daughter    Asthma Daughter    Migraines Daughter    Fibromyalgia Daughter    Interstitial cystitis Daughter    Asthma Daughter    Colitis Daughter    Thyroid cancer Daughter 59   Diabetes Brother    Hypertension Brother    Breast cancer Neg Hx    Social History   Tobacco Use   Smoking status: Never Smoker   Smokeless tobacco: Never Used  Scientific laboratory technician Use: Never used  Substance Use Topics   Alcohol use: Not Currently    Alcohol/week: 0.0 standard drinks     Comment: Due to AF   Drug use: No    Pertinent Clinical Results:  LABS: Labs reviewed: Acceptable for surgery.  Hospital Outpatient Visit on 10/17/2019  Component Date Value Ref Range Status   aPTT 10/17/2019 30  24 - 36 seconds Final   Performed at [facility], [place], [place] [address]   Sodium 10/17/2019 139  135 - 145 mmol/L Final   Potassium 10/17/2019 4.0  3.5 - 5.1 mmol/L Final   Chloride 10/17/2019 103  98 - 111 mmol/L Final   CO2 10/17/2019 28  22 - 32 mmol/L Final   Glucose, Bld 10/17/2019 91  70 - 99 mg/dL Final   Glucose reference range applies only to samples taken after fasting for at least 8 hours.   BUN 10/17/2019 17  8 - 23 mg/dL Final   Creatinine, Ser 10/17/2019 0.98  0.44 - 1.00 mg/dL Final   Calcium 10/17/2019 9.1  8.9 - 10.3 mg/dL Final   GFR calc non Af Amer 10/17/2019 >60  >60 mL/min Final   GFR calc Af Amer 10/17/2019 >60  >60 mL/min Final   Anion gap 10/17/2019 8  5 - 15 Final   Performed at [facility], [place], [place] [address]   WBC 10/17/2019 6.5  4.0 - 10.5 K/uL Final   RBC 10/17/2019 3.59* 3.87 - 5.11 MIL/uL Final   Hemoglobin 10/17/2019 11.5* 12.0 - 15.0 g/dL Final   HCT 10/17/2019 35.6* 36 - 46 % Final   MCV 10/17/2019 99.2  80.0 - 100.0 fL Final   MCH 10/17/2019 32.0  26.0 - 34.0 pg Final   MCHC 10/17/2019 32.3  30.0 - 36.0 g/dL Final   RDW 10/17/2019 15.7* 11.5 - 15.5 % Final   Platelets 10/17/2019 304  150 - 400 K/uL Final   nRBC 10/17/2019 0.0  0.0 - 0.2 % Final   Performed at [facility], [address] [place], [place] [address]   Prothrombin Time 10/17/2019 12.4  11.4 - 15.2 seconds Final   INR 10/17/2019 1.0  0.8 - 1.2 Final   Comment: (NOTE) INR goal varies based on device and disease states. Performed at [facility], [address] [place], [place] [address]    MRSA, PCR 10/17/2019 NEGATIVE  NEGATIVE Final   Staphylococcus aureus 10/17/2019 NEGATIVE   NEGATIVE Final   Comment: (NOTE) The Xpert SA Assay (FDA approved for NASAL specimens in patients 70 years of age and older), is one component of a comprehensive surveillance program. It is not intended to diagnose infection nor to guide or monitor treatment. Performed at [facility], [address] [place], [place] [address]    ABO/RH(D) 10/17/2019 PENDING   Incomplete   Antibody Screen 10/17/2019 PENDING   Incomplete   Sample Expiration 10/17/2019 10/31/2019,2359   Final   Extend sample reason 10/17/2019    Final                   Value:NO TRANSFUSIONS OR PREGNANCY IN THE PAST 3 MONTHS Performed at [facility], [address] [place], [place] [address]    Color, Urine 10/17/2019 STRAW* YELLOW Final   APPearance 10/17/2019 CLEAR* CLEAR Final   Specific Gravity, Urine 10/17/2019 1.006  1.005 - 1.030 Final   pH 10/17/2019 5.0  5.0 - 8.0 Final   Glucose, UA 10/17/2019 NEGATIVE  NEGATIVE mg/dL Final   Hgb urine dipstick 10/17/2019 NEGATIVE  NEGATIVE Final   Bilirubin Urine 10/17/2019 NEGATIVE  NEGATIVE Final   Ketones, ur 10/17/2019 NEGATIVE  NEGATIVE mg/dL Final   Protein, ur 10/17/2019 NEGATIVE  NEGATIVE mg/dL Final   Nitrite 10/17/2019 NEGATIVE  NEGATIVE Final   Leukocytes,Ua 10/17/2019 NEGATIVE  NEGATIVE Final   RBC / HPF 10/17/2019 0-5  0 - 5 RBC/hpf Final   WBC, UA 10/17/2019 0-5  0 - 5 WBC/hpf Final   Bacteria, UA 10/17/2019 FEW* NONE SEEN Final   Squamous Epithelial / LPF 10/17/2019 0-5  0 - 5 Final   Mucus 10/17/2019 PRESENT   Final   Performed at [facility], [address] [place], [place] [address]    ECG: Date: 06/12/2019 Time ECG obtained: 0949 AM Rate: 55 bpm Rhythm: sinus bradycardia Axis (leads I and aVF): Normal Intervals: PR 144 ms. QRS 80 ms. QTc 432 ms. ST segment and T wave changes: Non-specific ST abnormality; no evidence of acute ST segment elevation or depression Comparison: Similar to previous tracing obtained on  06/05/2019.   IMAGING / PROCEDURES: CARDIAC CT done on 02/23/2019 There is normal pulmonary vein drainage into the left atrium with ostial measurements above. There is no thrombus in the left atrial appendage. The esophagus runs in the left atrial midline and is not in the proximity to any of the pulmonary veins. No PFO/ASD. Normal coronary origin. Right dominance. CAC score of 0. No CAD in the proximal segments. Normal LVEF, 65%.  No wall motion abnormalities  EXERCISE TOLERANCE TEST done on 09/27/2017 Resting EKG normal sinus rhythm rate 80 bpm no significant ST or T wave changes Resting blood pressure 115/71 Exercised on Bruce protocol; good exercise tolerance No significant ST or T wave changes noted at peak stress or in recovery No significant arrhythmia Target heart rate achieved 136 bpm, 87% of maximum predicted heart rate Achieved 7 METS, peak blood pressure 166/71 Normal exercise treadmill study   Impression and Plan:  Sonya Lopez has been referred for pre-anesthesia review and clearance prior to her undergoing the planned anesthetic and procedural courses. Available labs, pertinent testing, and imaging results were personally reviewed by me. This patient has been appropriately cleared by cardiology.   Based on clinical review performed today (10/17/19), barring any significant acute changes in the patient's overall condition, it is anticipated that she will be able to proceed with the planned surgical intervention. Any acute changes in clinical condition may necessitate her procedure being postponed and/or cancelled. Pre-surgical instructions were reviewed with the patient during her PAT appointment and questions were fielded by PAT clinical staff.  Honor Loh, MSN, APRN, FNP-C, CEN [facility]  Peri-operative Services Nurse Practitioner Phone: [phone] 10/17/19 3:26 PM  NOTE: This note has been prepared using [employer].  Despite my best ability to proofread, there is always the potential that unintentional transcriptional errors may still occur from this process.

## 2019-10-22 ENCOUNTER — Telehealth: Payer: Self-pay | Admitting: *Deleted

## 2019-10-22 ENCOUNTER — Other Ambulatory Visit: Payer: Self-pay

## 2019-10-22 ENCOUNTER — Ambulatory Visit: Payer: Medicare Other | Admitting: Dermatology

## 2019-10-22 ENCOUNTER — Other Ambulatory Visit
Admission: RE | Admit: 2019-10-22 | Discharge: 2019-10-22 | Disposition: A | Discharge status: Home / Self Care | Payer: Medicare Other | Source: Ambulatory Visit | Attending: Neurosurgery | Admitting: Neurosurgery

## 2019-10-22 DIAGNOSIS — Z20822 Contact with and (suspected) exposure to covid-19: Secondary | ICD-10-CM | POA: Diagnosis not present

## 2019-10-22 DIAGNOSIS — Z01812 Encounter for preprocedural laboratory examination: Secondary | ICD-10-CM | POA: Diagnosis not present

## 2019-10-22 NOTE — Telephone Encounter (Signed)
Completed Ajovy PA on Cover My Meds. Key: [id number]. Awaiting determination from [facility].

## 2019-10-23 LAB — SARS CORONAVIRUS 2 (TAT 6-24 HRS): SARS Coronavirus 2: NEGATIVE

## 2019-10-23 NOTE — Telephone Encounter (Signed)
I'll see her for her next botox and we can decide then thanks

## 2019-10-23 NOTE — Telephone Encounter (Signed)
Noted. Next appt is March 2022.

## 2019-10-23 NOTE — Telephone Encounter (Signed)
We received a denial from [employer]. Ajovy denied due to requirement  that patient try and fail or have con--traindications to [place] and Emgality. If we should choose to appeal, fax to [phone] within 60 calendar days. Per office note, Dr Jaynee Eagles stated Emgality would be ordered if Ajovy denied. Pt was also given Ajovy samples to while waiting on determination. PA reference # [id number].

## 2019-10-24 ENCOUNTER — Ambulatory Visit
Admission: RE | Admit: 2019-10-24 | Discharge: 2019-10-24 | Disposition: A | Discharge status: Home / Self Care | Payer: Medicare Other | Attending: Neurosurgery | Admitting: Neurosurgery

## 2019-10-24 ENCOUNTER — Ambulatory Visit: Payer: Medicare Other | Admitting: Neurology

## 2019-10-24 ENCOUNTER — Encounter
Admission: RE | Disposition: A | Discharge status: Home / Self Care | Payer: Self-pay | Source: Home / Self Care | Attending: Neurosurgery

## 2019-10-24 ENCOUNTER — Ambulatory Visit: Payer: Medicare Other

## 2019-10-24 ENCOUNTER — Ambulatory Visit: Payer: Medicare Other | Admitting: Anesthesiology

## 2019-10-24 ENCOUNTER — Ambulatory Visit: Payer: Medicare Other | Admitting: Urgent Care

## 2019-10-24 ENCOUNTER — Encounter: Payer: Self-pay | Admitting: Neurosurgery

## 2019-10-24 ENCOUNTER — Other Ambulatory Visit: Payer: Self-pay

## 2019-10-24 DIAGNOSIS — F419 Anxiety disorder, unspecified: Secondary | ICD-10-CM | POA: Insufficient documentation

## 2019-10-24 DIAGNOSIS — Z79899 Other long term (current) drug therapy: Secondary | ICD-10-CM | POA: Insufficient documentation

## 2019-10-24 DIAGNOSIS — F418 Other specified anxiety disorders: Secondary | ICD-10-CM | POA: Diagnosis not present

## 2019-10-24 DIAGNOSIS — Z853 Personal history of malignant neoplasm of breast: Secondary | ICD-10-CM | POA: Diagnosis not present

## 2019-10-24 DIAGNOSIS — Z6837 Body mass index (BMI) 37.0-37.9, adult: Secondary | ICD-10-CM | POA: Insufficient documentation

## 2019-10-24 DIAGNOSIS — I4891 Unspecified atrial fibrillation: Secondary | ICD-10-CM | POA: Insufficient documentation

## 2019-10-24 DIAGNOSIS — Z923 Personal history of irradiation: Secondary | ICD-10-CM | POA: Diagnosis not present

## 2019-10-24 DIAGNOSIS — R600 Localized edema: Secondary | ICD-10-CM | POA: Diagnosis not present

## 2019-10-24 DIAGNOSIS — M4322 Fusion of spine, cervical region: Secondary | ICD-10-CM | POA: Diagnosis not present

## 2019-10-24 DIAGNOSIS — Z981 Arthrodesis status: Secondary | ICD-10-CM

## 2019-10-24 DIAGNOSIS — Z881 Allergy status to other antibiotic agents status: Secondary | ICD-10-CM | POA: Diagnosis not present

## 2019-10-24 DIAGNOSIS — M5412 Radiculopathy, cervical region: Secondary | ICD-10-CM | POA: Diagnosis not present

## 2019-10-24 DIAGNOSIS — G4733 Obstructive sleep apnea (adult) (pediatric): Secondary | ICD-10-CM | POA: Diagnosis not present

## 2019-10-24 DIAGNOSIS — E669 Obesity, unspecified: Secondary | ICD-10-CM | POA: Insufficient documentation

## 2019-10-24 DIAGNOSIS — M797 Fibromyalgia: Secondary | ICD-10-CM | POA: Insufficient documentation

## 2019-10-24 DIAGNOSIS — R410 Disorientation, unspecified: Secondary | ICD-10-CM | POA: Diagnosis not present

## 2019-10-24 DIAGNOSIS — Z419 Encounter for procedure for purposes other than remedying health state, unspecified: Secondary | ICD-10-CM

## 2019-10-24 DIAGNOSIS — K219 Gastro-esophageal reflux disease without esophagitis: Secondary | ICD-10-CM | POA: Diagnosis not present

## 2019-10-24 DIAGNOSIS — G43909 Migraine, unspecified, not intractable, without status migrainosus: Secondary | ICD-10-CM | POA: Insufficient documentation

## 2019-10-24 DIAGNOSIS — Z7982 Long term (current) use of aspirin: Secondary | ICD-10-CM | POA: Insufficient documentation

## 2019-10-24 HISTORY — PX: ANTERIOR CERVICAL DECOMP/DISCECTOMY FUSION: SHX1161

## 2019-10-24 SURGERY — ANTERIOR CERVICAL DECOMPRESSION/DISCECTOMY FUSION 2 LEVELS
Anesthesia: General

## 2019-10-24 MED ORDER — ACETAMINOPHEN 10 MG/ML IV SOLN
INTRAVENOUS | Status: DC | PRN
  Administered 2019-10-24: 1000 mg via INTRAVENOUS

## 2019-10-24 MED ORDER — ACETAMINOPHEN 10 MG/ML IV SOLN
INTRAVENOUS | Status: AC
  Filled 2019-10-24: qty 100

## 2019-10-24 MED ORDER — ONDANSETRON HCL 4 MG/2ML IJ SOLN
INTRAMUSCULAR | Status: AC
  Filled 2019-10-24: qty 2

## 2019-10-24 MED ORDER — FENTANYL CITRATE (PF) 100 MCG/2ML IJ SOLN
25.0000 ug | INTRAMUSCULAR | Status: DC | PRN
  Administered 2019-10-24 (×4): 25 ug via INTRAVENOUS

## 2019-10-24 MED ORDER — SUCCINYLCHOLINE CHLORIDE 20 MG/ML IJ SOLN
INTRAMUSCULAR | Status: DC | PRN
  Administered 2019-10-24: 120 mg via INTRAVENOUS

## 2019-10-24 MED ORDER — REMIFENTANIL HCL 1 MG IV SOLR
INTRAVENOUS | Status: DC | PRN
  Administered 2019-10-24: .05 ug/kg/min via INTRAVENOUS

## 2019-10-24 MED ORDER — PROPOFOL 10 MG/ML IV BOLUS
INTRAVENOUS | Status: DC | PRN
  Administered 2019-10-24: 140 mg via INTRAVENOUS
  Administered 2019-10-24: 60 mg via INTRAVENOUS

## 2019-10-24 MED ORDER — ACETAMINOPHEN 500 MG PO TABS: 1000.0000 mg | ORAL_TABLET | Freq: Three times a day (TID) | ORAL | 0 refills | Status: AC

## 2019-10-24 MED ORDER — MIDAZOLAM HCL 2 MG/2ML IJ SOLN
INTRAMUSCULAR | Status: DC | PRN
  Administered 2019-10-24: 2 mg via INTRAVENOUS

## 2019-10-24 MED ORDER — EPHEDRINE SULFATE 50 MG/ML IJ SOLN
INTRAMUSCULAR | Status: DC | PRN
  Administered 2019-10-24: 10 mg via INTRAVENOUS
  Administered 2019-10-24: 5 mg via INTRAVENOUS
  Administered 2019-10-24: 10 mg via INTRAVENOUS

## 2019-10-24 MED ORDER — DEXAMETHASONE SODIUM PHOSPHATE 10 MG/ML IJ SOLN
INTRAMUSCULAR | Status: DC | PRN
  Administered 2019-10-24: 10 mg via INTRAVENOUS

## 2019-10-24 MED ORDER — LACTATED RINGERS IV SOLN: Freq: Once | INTRAVENOUS | Status: AC

## 2019-10-24 MED ORDER — DEXAMETHASONE SODIUM PHOSPHATE 10 MG/ML IJ SOLN
INTRAMUSCULAR | Status: AC
  Filled 2019-10-24: qty 1

## 2019-10-24 MED ORDER — CHLORHEXIDINE GLUCONATE 0.12 % MT SOLN: 15.0000 mL | Freq: Once | OROMUCOSAL | Status: AC

## 2019-10-24 MED ORDER — CHLORHEXIDINE GLUCONATE 0.12 % MT SOLN
OROMUCOSAL | Status: AC
  Administered 2019-10-24: 15 mL via OROMUCOSAL
  Filled 2019-10-24: qty 15

## 2019-10-24 MED ORDER — OXYCODONE HCL 5 MG PO TABS: 5.0000 mg | ORAL_TABLET | ORAL | 0 refills | Status: AC | PRN

## 2019-10-24 MED ORDER — SODIUM CHLORIDE 0.9 % IR SOLN
Status: DC | PRN
  Administered 2019-10-24: 1000 mL

## 2019-10-24 MED ORDER — ORAL CARE MOUTH RINSE: 15.0000 mL | Freq: Once | OROMUCOSAL | Status: AC

## 2019-10-24 MED ORDER — ROCURONIUM BROMIDE 100 MG/10ML IV SOLN
INTRAVENOUS | Status: DC | PRN
  Administered 2019-10-24: 10 mg via INTRAVENOUS

## 2019-10-24 MED ORDER — EPHEDRINE 5 MG/ML INJ
INTRAVENOUS | Status: AC
  Filled 2019-10-24: qty 10

## 2019-10-24 MED ORDER — LIDOCAINE HCL (PF) 2 % IJ SOLN
INTRAMUSCULAR | Status: AC
  Filled 2019-10-24: qty 5

## 2019-10-24 MED ORDER — METHOCARBAMOL 500 MG PO TABS: 500.0000 mg | ORAL_TABLET | Freq: Four times a day (QID) | ORAL | 0 refills | Status: DC

## 2019-10-24 MED ORDER — ONDANSETRON HCL 4 MG/2ML IJ SOLN
4.0000 mg | Freq: Once | INTRAMUSCULAR | Status: AC | PRN
  Administered 2019-10-24: 4 mg via INTRAVENOUS

## 2019-10-24 MED ORDER — REMIFENTANIL HCL 1 MG IV SOLR
INTRAVENOUS | Status: AC
  Filled 2019-10-24: qty 1000

## 2019-10-24 MED ORDER — FENTANYL CITRATE (PF) 100 MCG/2ML IJ SOLN
INTRAMUSCULAR | Status: AC
  Administered 2019-10-24: 25 ug via INTRAVENOUS
  Filled 2019-10-24: qty 2

## 2019-10-24 MED ORDER — GLYCOPYRROLATE 0.2 MG/ML IJ SOLN
INTRAMUSCULAR | Status: AC
  Filled 2019-10-24: qty 1

## 2019-10-24 MED ORDER — METHOCARBAMOL 500 MG PO TABS
ORAL_TABLET | ORAL | Status: AC
  Administered 2019-10-24: 500 mg
  Filled 2019-10-24: qty 1

## 2019-10-24 MED ORDER — SUCCINYLCHOLINE CHLORIDE 200 MG/10ML IV SOSY
PREFILLED_SYRINGE | INTRAVENOUS | Status: AC
  Filled 2019-10-24: qty 10

## 2019-10-24 MED ORDER — ONDANSETRON HCL 4 MG/2ML IJ SOLN
INTRAMUSCULAR | Status: DC | PRN
  Administered 2019-10-24: 4 mg via INTRAVENOUS

## 2019-10-24 MED ORDER — GLYCOPYRROLATE 0.2 MG/ML IJ SOLN
INTRAMUSCULAR | Status: DC | PRN
  Administered 2019-10-24: .2 mg via INTRAVENOUS

## 2019-10-24 MED ORDER — FENTANYL CITRATE (PF) 100 MCG/2ML IJ SOLN
INTRAMUSCULAR | Status: AC
  Filled 2019-10-24: qty 2

## 2019-10-24 MED ORDER — ROCURONIUM BROMIDE 10 MG/ML (PF) SYRINGE
PREFILLED_SYRINGE | INTRAVENOUS | Status: AC
  Filled 2019-10-24: qty 10

## 2019-10-24 MED ORDER — FENTANYL CITRATE (PF) 100 MCG/2ML IJ SOLN
INTRAMUSCULAR | Status: DC | PRN
Start: 2019-10-24 — End: 2019-10-24
  Administered 2019-10-24: 25 ug via INTRAVENOUS
  Administered 2019-10-24: 75 ug via INTRAVENOUS

## 2019-10-24 MED ORDER — MIDAZOLAM HCL 2 MG/2ML IJ SOLN
INTRAMUSCULAR | Status: AC
  Filled 2019-10-24: qty 2

## 2019-10-24 MED ORDER — VANCOMYCIN HCL 1500 MG/300ML IV SOLN
1500.0000 mg | Freq: Once | INTRAVENOUS | Status: AC
  Administered 2019-10-24: 1500 mg via INTRAVENOUS
  Filled 2019-10-24: qty 300

## 2019-10-24 MED ORDER — LIDOCAINE HCL (CARDIAC) PF 100 MG/5ML IV SOSY
PREFILLED_SYRINGE | INTRAVENOUS | Status: DC | PRN
  Administered 2019-10-24: 60 mg via INTRAVENOUS

## 2019-10-24 MED ORDER — THROMBIN 5000 UNITS EX SOLR
CUTANEOUS | Status: DC | PRN
  Administered 2019-10-24: 5000 [IU] via TOPICAL

## 2019-10-24 MED ORDER — BUPIVACAINE-EPINEPHRINE (PF) 0.5% -1:200000 IJ SOLN
INTRAMUSCULAR | Status: DC | PRN
  Administered 2019-10-24: 7 mL

## 2019-10-24 MED ORDER — PROPOFOL 10 MG/ML IV BOLUS
INTRAVENOUS | Status: AC
  Filled 2019-10-24: qty 20

## 2019-10-24 MED ORDER — SODIUM CHLORIDE 0.9 % IV SOLN
INTRAVENOUS | Status: DC | PRN
  Administered 2019-10-24: 25 ug/min via INTRAVENOUS

## 2019-10-24 SURGICAL SUPPLY — 66 items
ADH SKN CLS APL DERMABOND .7 (GAUZE/BANDAGES/DRESSINGS) ×1
AGENT HMST MTR 8 SURGIFLO (HEMOSTASIS) ×1
APL PRP STRL LF DISP 70% ISPRP (MISCELLANEOUS) ×2
BASKET BONE COLLECTION (BASKET) IMPLANT
BULB RESERV EVAC DRAIN JP 100C (MISCELLANEOUS) IMPLANT
BUR NEURO DRILL SOFT 3.0X3.8M (BURR) ×2 IMPLANT
CANISTER SUCT 1200ML W/VALVE (MISCELLANEOUS) ×4 IMPLANT
CHLORAPREP W/TINT 26 (MISCELLANEOUS) ×4 IMPLANT
COUNTER NEEDLE 20/40 LG (NEEDLE) ×2 IMPLANT
COVER LIGHT HANDLE STERIS (MISCELLANEOUS) ×4 IMPLANT
COVER WAND RF STERILE (DRAPES) ×2 IMPLANT
CUP MEDICINE 2OZ PLAST GRAD ST (MISCELLANEOUS) ×2 IMPLANT
DERMABOND ADVANCED (GAUZE/BANDAGES/DRESSINGS) ×1
DERMABOND ADVANCED .7 DNX12 (GAUZE/BANDAGES/DRESSINGS) ×1 IMPLANT
DRAIN CHANNEL JP 10F RND 20C F (MISCELLANEOUS) IMPLANT
DRAPE C-ARM 42X72 X-RAY (DRAPES) ×4 IMPLANT
DRAPE LAPAROTOMY 77X122 PED (DRAPES) ×2 IMPLANT
DRAPE MICROSCOPE SPINE 48X150 (DRAPES) ×2 IMPLANT
DRAPE SURG 17X11 SM STRL (DRAPES) ×8 IMPLANT
DRSG TEGADERM 4X4.75 (GAUZE/BANDAGES/DRESSINGS) ×2 IMPLANT
ELECT CAUTERY BLADE TIP 2.5 (TIP) ×2
ELECT REM PT RETURN 9FT ADLT (ELECTROSURGICAL) ×2
ELECTRODE CAUTERY BLDE TIP 2.5 (TIP) ×1 IMPLANT
ELECTRODE REM PT RTRN 9FT ADLT (ELECTROSURGICAL) ×1 IMPLANT
FEE INTRAOP MONITOR IMPULS NCS (MISCELLANEOUS) IMPLANT
FRAME EYE SHIELD (PROTECTIVE WEAR) ×2 IMPLANT
GLOVE BIOGEL PI IND STRL 7.0 (GLOVE) ×1 IMPLANT
GLOVE BIOGEL PI INDICATOR 7.0 (GLOVE) ×1
GLOVE SURG SYN 7.0 (GLOVE) ×4 IMPLANT
GLOVE SURG SYN 7.0 PF PI (GLOVE) ×2 IMPLANT
GLOVE SURG SYN 8.5  E (GLOVE) ×3
GLOVE SURG SYN 8.5 E (GLOVE) ×3 IMPLANT
GLOVE SURG SYN 8.5 PF PI (GLOVE) ×3 IMPLANT
GOWN SRG XL LVL 3 NONREINFORCE (GOWNS) ×1 IMPLANT
GOWN STRL NON-REIN TWL XL LVL3 (GOWNS) ×2
GOWN STRL REUS W/ TWL XL LVL3 (GOWN DISPOSABLE) ×1 IMPLANT
GOWN STRL REUS W/TWL XL LVL3 (GOWN DISPOSABLE) ×2
GRADUATE 1200CC STRL 31836 (MISCELLANEOUS) ×2 IMPLANT
INTRAOP MONITOR FEE IMPULS NCS (MISCELLANEOUS)
INTRAOP MONITOR FEE IMPULSE (MISCELLANEOUS)
KIT TURNOVER KIT A (KITS) ×2 IMPLANT
MARKER SKIN DUAL TIP RULER LAB (MISCELLANEOUS) ×4 IMPLANT
NDL SAFETY ECLIPSE 18X1.5 (NEEDLE) ×1 IMPLANT
NEEDLE HYPO 18GX1.5 SHARP (NEEDLE) ×2
NEEDLE HYPO 22GX1.5 SAFETY (NEEDLE) ×2 IMPLANT
NS IRRIG 1000ML POUR BTL (IV SOLUTION) ×2 IMPLANT
PACK LAMINECTOMY NEURO (CUSTOM PROCEDURE TRAY) ×2 IMPLANT
PAD ARMBOARD 7.5X6 YLW CONV (MISCELLANEOUS) ×2 IMPLANT
PIN CASPAR 14 (PIN) ×1 IMPLANT
PIN CASPAR 14MM (PIN) ×2 IMPLANT
PLATE ANT CERV XTEND 2 LV 28 (Plate) ×1 IMPLANT
PUTTY DBX 1CC (Putty) ×2 IMPLANT
PUTTY DBX 1CC DEPUY (Putty) IMPLANT
SCREW VAR 4.2 XD SELF DRILL 16 (Screw) ×6 IMPLANT
SPACER C HEDRON 12X14 7M 7D (Spacer) ×2 IMPLANT
SPOGE SURGIFLO 8M (HEMOSTASIS) ×1
SPONGE KITTNER 5P (MISCELLANEOUS) ×2 IMPLANT
SPONGE SURGIFLO 8M (HEMOSTASIS) ×1 IMPLANT
STAPLER SKIN PROX 35W (STAPLE) IMPLANT
SUT V-LOC 90 ABS DVC 3-0 CL (SUTURE) ×2 IMPLANT
SUT VIC AB 3-0 SH 8-18 (SUTURE) ×2 IMPLANT
SYR 30ML LL (SYRINGE) ×2 IMPLANT
TAPE CLOTH 3X10 WHT NS LF (GAUZE/BANDAGES/DRESSINGS) ×2 IMPLANT
TOWEL OR 17X26 4PK STRL BLUE (TOWEL DISPOSABLE) ×6 IMPLANT
TRAY FOLEY MTR SLVR 16FR STAT (SET/KITS/TRAYS/PACK) IMPLANT
TUBING CONNECTING 10 (TUBING) ×2 IMPLANT

## 2019-10-24 NOTE — H&P (Signed)
  I have reviewed and confirmed my history and physical from 10/04/19 with no additions or changes. Plan for C5-7 ACDF.  Risks and benefits reviewed.  Heart sounds normal no MRG. Chest Clear to Auscultation Bilaterally.

## 2019-10-24 NOTE — Anesthesia Procedure Notes (Signed)
Procedure Name: Intubation Date/Time: 10/24/2019 9:56 AM Performed by: Aline Brochure, CRNA Pre-anesthesia Checklist: Patient identified, Emergency Drugs available, Suction available and Patient being monitored Patient Re-evaluated:Patient Re-evaluated prior to induction Oxygen Delivery Method: Circle system utilized Preoxygenation: Pre-oxygenation with 100% oxygen Induction Type: IV induction Ventilation: Mask ventilation without difficulty Laryngoscope Size: McGraph and 3 Grade View: Grade II Tube size: 7.0 mm Number of attempts: 1 Airway Equipment and Method: Bougie stylet and Video-laryngoscopy Placement Confirmation: ETT inserted through vocal cords under direct vision,  positive ETCO2 and breath sounds checked- equal and bilateral Secured at: 22 cm Tube secured with: Tape Dental Injury: Teeth and Oropharynx as per pre-operative assessment  Difficulty Due To: Difficulty was anticipated and Difficult Airway- due to anterior larynx

## 2019-10-24 NOTE — Transfer of Care (Signed)
Immediate Anesthesia Transfer of Care Note  Patient: Sonya Lopez  Procedure(s) Performed: ANTERIOR CERVICAL DECOMPRESSION/DISCECTOMY FUSION 2 LEVELS C5-7 (N/A )  Patient Location: PACU  Anesthesia Type:General  Level of Consciousness: awake, alert  and oriented  Airway & Oxygen Therapy: Patient Spontanous Breathing and Patient connected to face mask oxygen  Post-op Assessment: Post -op Vital signs reviewed and stable  Post vital signs: stable  Last Vitals:  Vitals Value Taken Time  BP 125/51 10/24/19 1202  Temp 36.3 C 10/24/19 1202  Pulse 84 10/24/19 1206  Resp 17 10/24/19 1206  SpO2 98 % 10/24/19 1206  Vitals shown include unvalidated device data.  Last Pain:  Vitals:   10/24/19 0846  TempSrc: Tympanic  PainSc: 6          Complications: No complications documented.

## 2019-10-24 NOTE — Anesthesia Preprocedure Evaluation (Signed)
Anesthesia Evaluation  Patient identified by MRN, date of birth, ID band Patient awake    Reviewed: Allergy & Precautions, H&P , NPO status , Patient's Chart, lab work & pertinent test results, reviewed documented beta blocker date and time   History of Anesthesia Complications (+) PONV, DIFFICULT AIRWAY and history of anesthetic complications  Airway Mallampati: III  TM Distance: <3 FB Neck ROM: full  Mouth opening: Limited Mouth Opening  Dental  (+) Teeth Intact   Pulmonary sleep apnea and Continuous Positive Airway Pressure Ventilation ,    Pulmonary exam normal        Cardiovascular Exercise Tolerance: Good negative cardio ROS  Atrial Fibrillation  Rhythm:regular Rate:Normal  Status post cardiac oblation. ja   Neuro/Psych  Headaches, PSYCHIATRIC DISORDERS Anxiety Depression  Neuromuscular disease    GI/Hepatic Neg liver ROS, GERD  Medicated,  Endo/Other  negative endocrine ROS  Renal/GU Renal disease  negative genitourinary   Musculoskeletal   Abdominal   Peds  Hematology negative hematology ROS (+)   Anesthesia Other Findings Past Medical History: No date: Allergy No date: Anxiety     Comment:  situational anxiety No date: Arrhythmia     Comment:  A-fib/A-flutter No date: Atrial flutter ([place]) 10/2015: Breast neoplasm, Tis (DCIS), left     Comment:  ER 50%, PR 11-50%.Unable to tolerate Tamoxifen or               Aromasin.  MammoSite 2017: Cancer [facility])     Comment:  Left- DCIS No date: Depression 2009: Difficult intubation     Comment:  [place] had to use  'boogee"               [place] 01/05/2011: Female cystocele No date: Fibromyalgia No date: GERD (gastroesophageal reflux disease) No date: Headache(784.0)     Comment:  MIGRAINES No date: History of kidney stones No date: Obesity 03/27/2019: OSA (obstructive sleep apnea)      Comment:  USE CPAP 2017: Personal history of radiation therapy     Comment:  LEFT lumpectomy No date: PONV (postoperative nausea and vomiting)     Comment:  h/o difficult intubation and anes had to use "boogee" No date: Vitreous detachment of right eye     Comment:  sees floaters Past Surgical History: 2013: ABDOMINAL HYSTERECTOMY 11/30/2011: ANTERIOR AND POSTERIOR REPAIR     Comment:  Procedure: ANTERIOR (CYSTOCELE) AND POSTERIOR REPAIR               (RECTOCELE);  Surgeon: Reece Packer, MD;  Location:              [place] [facility];  Service: Urology;  Laterality: N/A;  with cysto 03/01/2019: ATRIAL FIBRILLATION ABLATION; N/A     Comment:  Procedure: ATRIAL FIBRILLATION ABLATION;  Surgeon:               Constance Haw, MD;  Location: [place] CV LAB;               Service: Cardiovascular;  Laterality: N/A; 11/30/2011: BLADDER SUSPENSION     Comment:  Procedure: [place] PROCEDURE;  Surgeon: Reece Packer, MD;  Location: [place] [facility];  Service: Urology;                Laterality: N/A;  graft 10x6 2007: BREAST BIOPSY; Right     Comment:  negative core 09/16/2015: BREAST BIOPSY;  Left     Comment:  DCIS 09/30/2015: BREAST LUMPECTOMY; Left     Comment:  DCIS clear margins and rad tx/HIGH GRADE DUCTAL               CARCINOMA IN SITU, COMEDO TYPE 09/30/2015: BREAST LUMPECTOMY WITH NEEDLE LOCALIZATION; Left     Comment:  DCIS excision, MammoSite radiation;  Surgeon: Robert Bellow, MD;  Location: [facility] ORS;  Service: General;                Laterality: Left; 01/2019: CATARACT EXTRACTION; Left No date: CESAREAN SECTION     Comment:  x 1 2005: CHOLECYSTECTOMY 2009: COLONOSCOPY 11/30/2011: CYSTOSCOPY     Comment:  Procedure: CYSTOSCOPY;  Surgeon: Reece Packer, MD;              Location: [place] [facility];  Service: Urology;; 05/05/2007: [place] OF UTERUS 2004: [place]: TONSILLECTOMY 11/30/2011: VAGINAL HYSTERECTOMY     Comment:   Procedure: HYSTERECTOMY VAGINAL;  Surgeon: Emily Filbert,               MD;  Location: [place] [facility];  Service: Gynecology;  Laterality:              N/A; BMI    Body Mass Index: 37.36 kg/m     Reproductive/Obstetrics negative OB ROS                             Anesthesia Physical Anesthesia Plan  ASA: III  Anesthesia Plan: General ETT   Post-op Pain Management:    Induction:   PONV Risk Score and Plan:   Airway Management Planned:   Additional Equipment:   Intra-op Plan:   Post-operative Plan:   Informed Consent: I have reviewed the patients History and Physical, chart, labs and discussed the procedure including the risks, benefits and alternatives for the proposed anesthesia with the patient or authorized representative who has indicated his/her understanding and acceptance.     Dental Advisory Given  Plan Discussed with: CRNA  Anesthesia Plan Comments:         Anesthesia Quick Evaluation

## 2019-10-24 NOTE — Progress Notes (Signed)
Pharmacy Antibiotic Note  Sonya Lopez is a 64 y.o. female admitted on (Not on file) with surgical prophylaxis.  Pharmacy has been consulted for vancomycin dosing.  Plan: Vancomycin 1500 mg (15mg /kg) IV X 1      No data recorded.  Recent Labs  Lab 10/17/19 1034  WBC 6.5  CREATININE 0.98    Estimated Creatinine Clearance: 69.3 mL/min (by C-G formula based on SCr of 0.98 mg/dL).    Allergies  Allergen Reactions  . Adhesive [Tape] Dermatitis    SKIN TURNS RED-PAPER TAPE OK TO USE  . Macrodantin [Nitrofurantoin] Nausea And Vomiting  . Metronidazole Nausea Only  . Nitrofurantoin Macrocrystal Nausea And Vomiting  . Sulfamethoxazole-Trimethoprim Nausea Only  . Cephalexin Rash  . Clindamycin Hcl Rash  . Doxycycline Swelling and Rash  . Erythromycin Rash    Antimicrobials this admission:   >>    >>   Dose adjustments this admission:   Microbiology results:  BCx:   UCx:    Sputum:    MRSA PCR:   Thank you for allowing pharmacy to be a part of this patient's care.  Berley Gambrell D 10/24/2019 1:25 AM

## 2019-10-24 NOTE — Op Note (Signed)
Indications: Ms. Udovich is a 64 yo female who presented with cervical radiculopathy.  She failed conservative management and elected for surgical intervention.  Findings: cervical stenosis  Preoperative Diagnosis: Cervical radiculopathy Postoperative Diagnosis: same   EBL: 50 ml IVF: 700 ml Drains: none Disposition: Extubated and Stable to PACU Complications: none  No foley catheter was placed.   Preoperative Note:   Risks of surgery discussed include: infection, bleeding, stroke, coma, death, paralysis, CSF leak, nerve/spinal cord injury, numbness, tingling, weakness, complex regional pain syndrome, recurrent stenosis and/or disc herniation, vascular injury, development of instability, neck/back pain, need for further surgery, persistent symptoms, development of deformity, and the risks of anesthesia. The patient understood these risks and agreed to proceed.  Operative Note:   Procedure:  1) Anterior cervical diskectomy and fusion at C5/6 and C6/7 2) Anterior cervical instrumentation at C5 - 7 using Globus Xtend 3) Placement of biomechanical devices at C5/6 and C6/7  4) Use of operative microscope 5) Use of flouroscopy   Procedure: After obtaining informed consent, the patient taken to the operating room, placed in supine position, general anesthesia induced.  The patient had a small shoulder roll placed behind their shoulders.  The patient received preop antibiotics and IV Decadron.  The patient had a neck incision outlined, was prepped and draped in usual sterile fashion. The incision was injected with local anesthetic.   An incision was opened, dissection taken down medial to the carotid artery and jugular vein, lateral to the trachea and esophagus.  The prevertebral fascia identified and a localizing x-ray demonstrated the correct level.  The longus colli were dissected laterally, and self-retaining retractors placed to open the operative field. The microscope was then brought  into the field.  With this complete, distractor pins were placed in the vertebral bodies of C5 and C7. The distractor was placed, and the annuli at C5/6 and C6/7 were opened using a bovie.  Curettes and pituitary rongeurs used to remove the majority of disk, then the drill was used to remove the posterior osteophyte and begin the foraminotomies. The nerve hook was used to elevate the posterior longitudinal ligament, which was then removed with Kerrison rongeurs. The microblunt nerve hook could be passed out the foramen bilateral at each level.   Meticulous hemostasis obtained.  A biomechanical device (Globus Hedron 7 mm height x 14 mm width by 12 mm depth) was placed at C6/7. A second biomechanical device (Globus Hedron 7 mm height x 14 mm width by 12 mm depth) was placed at C5/6. Each device had been filled with allograft for aid in arthrodesis.  A small amount of bleeding was noted to be coming through the disc space at C6-7.  The device was removed and additional bipolar and hemostatic matrix used to control the bleeding. The device at C6-7 was then replaced in position.  The caspar distractor was removed, and bone wax used for hemostasis. A 28 mm Globus Xtend plate was chosen.  Two screws placed in each vertebral body, respectively making sure the screws were behind the locking mechanism.  Final AP and lateral radiographs were taken.   With everything in good position, the wound was irrigated copiously with bacitracin-containing solution and meticulous hemostasis obtained.  Wound was closed in 2 layers using interrupted inverted 3-0 Vicryl sutures in the platysma and 3-0 monocryl on the dermis.  The wound was dressed with dermabond, the head of bed at 30 degrees, taken to recovery room in stable condition.  No new postop neurological deficits  were identified.  Sponge and pattie counts were correct at the end of the procedure.     I performed the entire procedure with the assistance of Lonell Face NP as an Pensions consultant.  Meade Maw MD

## 2019-10-24 NOTE — Discharge Summary (Signed)
Procedure: ACDF C5-7 Procedure date: 10/24/2019 Diagnosis: cervical radiculopathy   History: Sonya Lopez is s/p ACDF C5-7 POD0: Tolerated procedure well. Evaluated in post op recovery still disoriented from anesthesia but able to answer questions and obey commands.   Physical Exam: Vitals:   10/24/19 1334 10/24/19 1348  BP: 139/62 (!) 136/55  Pulse: 84 68  Resp: 15 18  Temp: (!) 97.5 F (36.4 C) (!) 97.4 F (36.3 C)  SpO2: 92% 93%    General: Alert and oriented, lying in bed Strength:5/5 throughout  Sensation: intact and symmetric throughout  Skin: neck incision is clean, dry, intact  Data:  No results for input(s): NA, K, CL, CO2, BUN, CREATININE, LABGLOM, GLUCOSE, CALCIUM in the last 168 hours. No results for input(s): AST, ALT, ALKPHOS in the last 168 hours.  Invalid input(s): TBILI   No results for input(s): WBC, HGB, HCT, PLT in the last 168 hours. No results for input(s): APTT, INR in the last 168 hours.       Assessment/Plan:  Sonya Lopez is POD0 s/p ACDFC 5-7.   Once she obtains postoperative xrays, is able to ambulate and tolerate PO fluids, she can be discharged home. She has HHPT already setup. She will remain in the hospital for 4 hours postoperatively to ensure hemostasis.   She is to remain off [facility] for two weeks postoperatively.   She will f/u in two weeks with me in clinic.   Lonell Face, NP Department of Neurosurgery

## 2019-10-24 NOTE — Discharge Instructions (Addendum)
PLEASE HOLD ELIQUIS for 2 weeks after surgery-may restart October 7th, 2021.   Your surgeon has performed an operation on your cervical spine (neck) to relieve pressure on the spinal cord and/or nerves. This involved making an incision in the front of your neck and removing one or more of the discs that support your spine. Next, a small piece of bone, a titanium plate, and screws were used to fuse two or more of the vertebrae (bones) together.  The following are instructions to help in your recovery once you have been discharged from the hospital. Even if you feel well, it is important that you follow these activity guidelines. If you do not let your neck heal properly from the surgery, you can increase the chance of return of your symptoms and other complications.  * Do not take anti-inflammatory medications for 3 months after surgery (naproxen [Aleve], ibuprofen [Advil, Motrin], etc.). These medications can prevent your bones from healing properly.  Activity    No bending, lifting, or twisting ("BLT"). Avoid lifting objects heavier than 10 pounds (gallon milk jug).  Where possible, avoid household activities that involve lifting, bending, reaching, pushing, or pulling such as laundry, vacuuming, grocery shopping, and childcare. Try to arrange for help from friends and family for these activities while your back heals.  Increase physical activity slowly as tolerated.  Taking short walks is encouraged, but avoid strenuous exercise. Do not jog, run, bicycle, lift weights, or participate in any other exercises unless specifically allowed by your doctor.  Talk to your doctor before resuming sexual activity.  You should not drive until cleared by your doctor.  Until released by your doctor, you should not return to work or school.  You should rest at home and let your body heal.   You may shower three days after your surgery.  After showering, lightly dab your incision dry. Do not take a tub bath or  go swimming until approved by your doctor at your follow-up appointment.  If your doctor ordered a cervical collar (neck brace) for you, you should wear it whenever you are out of bed. You may remove it when lying down or sleeping, but you should wear it at all other times. Not all neck surgeries require a cervical collar.  If you smoke, we strongly recommend that you quit.  Smoking has been proven to interfere with normal bone healing and will dramatically reduce the success rate of your surgery. Please contact [facility] (800-QUIT-NOW) and use the resources at www.[facility].com for assistance in stopping smoking.  Surgical Incision   If you have a dressing on your incision, you may remove it two days after your surgery. Keep your incision area clean and dry.  If you have staples or stitches on your incision, you should have a follow up scheduled for removal. If you do not have staples or stitches, you will have steri-strips (small pieces of surgical tape) or Dermabond glue. The steri-strips/glue should begin to peel away within about a week (it is fine if the steri-strips fall off before then). If the strips are still in place one week after your surgery, you may gently remove them.  Diet           You may return to your usual diet. However, you may experience discomfort when swallowing in the first month after your surgery. This is normal. You may find that softer foods are more comfortable for you to swallow. Be sure to stay hydrated.  When to Contact Sonya Lopez  You may experience pain in your neck and/or pain between your shoulder blades. This is normal and should improve in the next few weeks with the help of pain medication, muscle relaxers, and rest. Some patients report that a warm compress on the back of the neck or between the shoulder blades helps.  However, should you experience any of the following, contact Sonya Lopez immediately: . New numbness or weakness . Pain that is progressively getting  worse, and is not relieved by your pain medication, muscle relaxers, rest, and warm compresses . Bleeding, redness, swelling, pain, or drainage from surgical incision . Chills or flu-like symptoms . Fever greater than 101.0 F (38.3 C) . Inability to eat, drink fluids, or take medications . Problems with bowel or bladder functions . Difficulty breathing or shortness of breath . Warmth, tenderness, or swelling in your calf Contact Information . During office hours (Monday-Friday 9 am to 5 pm), please call your physician at [phone] and ask for Berdine Addison . After hours and weekends, please call [phone] and an answering service will put you in touch with either Dr. Lacinda Axon or Dr. Izora Ribas.  . For a life-threatening emergency, call [place]   1) The drugs that you were given will stay in your system until tomorrow so for the next 24 hours you should not:  A) Drive an automobile B) Make any legal decisions C) Drink any alcoholic beverage   2) You may resume regular meals tomorrow.  Today it is better to start with liquids and gradually work up to solid foods.  You may eat anything you prefer, but it is better to start with liquids, then soup and crackers, and gradually work up to solid foods.   3) Please notify your doctor immediately if you have any unusual bleeding, trouble breathing, redness and pain at the surgery site, drainage, fever, or pain not relieved by medication.    4) Additional Instructions:        Please contact your physician with any problems or Same Day Surgery at [phone], Monday through Friday 6 am to 4 pm, or [place] at [facility] number at [phone].

## 2019-10-25 ENCOUNTER — Encounter: Payer: Self-pay | Admitting: Neurosurgery

## 2019-10-25 DIAGNOSIS — G43711 Chronic migraine without aura, intractable, with status migrainosus: Secondary | ICD-10-CM | POA: Diagnosis not present

## 2019-10-25 DIAGNOSIS — Z981 Arthrodesis status: Secondary | ICD-10-CM | POA: Diagnosis not present

## 2019-10-25 DIAGNOSIS — M4722 Other spondylosis with radiculopathy, cervical region: Secondary | ICD-10-CM | POA: Diagnosis not present

## 2019-10-25 DIAGNOSIS — M5136 Other intervertebral disc degeneration, lumbar region: Secondary | ICD-10-CM | POA: Diagnosis not present

## 2019-10-25 DIAGNOSIS — F4322 Adjustment disorder with anxiety: Secondary | ICD-10-CM | POA: Diagnosis not present

## 2019-10-25 DIAGNOSIS — G4733 Obstructive sleep apnea (adult) (pediatric): Secondary | ICD-10-CM | POA: Diagnosis not present

## 2019-10-25 DIAGNOSIS — M4802 Spinal stenosis, cervical region: Secondary | ICD-10-CM | POA: Diagnosis not present

## 2019-10-25 DIAGNOSIS — Z4789 Encounter for other orthopedic aftercare: Secondary | ICD-10-CM | POA: Diagnosis not present

## 2019-10-25 DIAGNOSIS — M797 Fibromyalgia: Secondary | ICD-10-CM | POA: Diagnosis not present

## 2019-10-25 DIAGNOSIS — F320 Major depressive disorder, single episode, mild: Secondary | ICD-10-CM | POA: Diagnosis not present

## 2019-10-25 DIAGNOSIS — E669 Obesity, unspecified: Secondary | ICD-10-CM | POA: Diagnosis not present

## 2019-10-25 DIAGNOSIS — Z853 Personal history of malignant neoplasm of breast: Secondary | ICD-10-CM | POA: Diagnosis not present

## 2019-10-25 DIAGNOSIS — I480 Paroxysmal atrial fibrillation: Secondary | ICD-10-CM | POA: Diagnosis not present

## 2019-10-25 DIAGNOSIS — Z6836 Body mass index (BMI) 36.0-36.9, adult: Secondary | ICD-10-CM | POA: Diagnosis not present

## 2019-10-25 DIAGNOSIS — E119 Type 2 diabetes mellitus without complications: Secondary | ICD-10-CM | POA: Diagnosis not present

## 2019-10-26 DIAGNOSIS — N179 Acute kidney failure, unspecified: Secondary | ICD-10-CM | POA: Diagnosis not present

## 2019-10-26 DIAGNOSIS — R5082 Postprocedural fever: Secondary | ICD-10-CM | POA: Diagnosis not present

## 2019-10-26 DIAGNOSIS — Z7901 Long term (current) use of anticoagulants: Secondary | ICD-10-CM | POA: Diagnosis not present

## 2019-10-26 DIAGNOSIS — I480 Paroxysmal atrial fibrillation: Secondary | ICD-10-CM | POA: Diagnosis not present

## 2019-10-26 DIAGNOSIS — Z79899 Other long term (current) drug therapy: Secondary | ICD-10-CM | POA: Diagnosis not present

## 2019-10-26 DIAGNOSIS — I482 Chronic atrial fibrillation, unspecified: Secondary | ICD-10-CM | POA: Diagnosis not present

## 2019-10-26 DIAGNOSIS — G4733 Obstructive sleep apnea (adult) (pediatric): Secondary | ICD-10-CM | POA: Diagnosis not present

## 2019-10-26 DIAGNOSIS — Z853 Personal history of malignant neoplasm of breast: Secondary | ICD-10-CM | POA: Diagnosis not present

## 2019-10-26 DIAGNOSIS — E119 Type 2 diabetes mellitus without complications: Secondary | ICD-10-CM | POA: Diagnosis not present

## 2019-10-26 DIAGNOSIS — Z981 Arthrodesis status: Secondary | ICD-10-CM | POA: Diagnosis not present

## 2019-10-26 DIAGNOSIS — N178 Other acute kidney failure: Secondary | ICD-10-CM | POA: Diagnosis not present

## 2019-10-26 DIAGNOSIS — G43909 Migraine, unspecified, not intractable, without status migrainosus: Secondary | ICD-10-CM | POA: Diagnosis not present

## 2019-10-26 DIAGNOSIS — Z881 Allergy status to other antibiotic agents status: Secondary | ICD-10-CM | POA: Diagnosis not present

## 2019-10-26 DIAGNOSIS — Z923 Personal history of irradiation: Secondary | ICD-10-CM | POA: Diagnosis not present

## 2019-10-26 DIAGNOSIS — Z20822 Contact with and (suspected) exposure to covid-19: Secondary | ICD-10-CM | POA: Diagnosis not present

## 2019-10-26 DIAGNOSIS — J189 Pneumonia, unspecified organism: Secondary | ICD-10-CM | POA: Diagnosis not present

## 2019-10-26 DIAGNOSIS — R0902 Hypoxemia: Secondary | ICD-10-CM | POA: Diagnosis not present

## 2019-10-26 DIAGNOSIS — M797 Fibromyalgia: Secondary | ICD-10-CM | POA: Diagnosis not present

## 2019-10-26 NOTE — Anesthesia Postprocedure Evaluation (Signed)
Anesthesia Post Note  Patient: TAIZ BICKLE  Procedure(s) Performed: ANTERIOR CERVICAL DECOMPRESSION/DISCECTOMY FUSION 2 LEVELS C5-7 (N/A )  Patient location during evaluation: PACU Anesthesia Type: General Level of consciousness: awake and alert Pain management: pain level controlled Vital Signs Assessment: post-procedure vital signs reviewed and stable Respiratory status: spontaneous breathing, nonlabored ventilation, respiratory function stable and patient connected to nasal cannula oxygen Cardiovascular status: blood pressure returned to baseline and stable Postop Assessment: no apparent nausea or vomiting Anesthetic complications: no   No complications documented.   Last Vitals:  Vitals:   10/24/19 1348 10/24/19 1537  BP: (!) 136/55 (!) 149/76  Pulse: 68 86  Resp: 18 16  Temp: (!) 36.3 C   SpO2: 93% 98%    Last Pain:  Vitals:   10/25/19 0906  TempSrc:   PainSc: [place]

## 2019-10-27 DIAGNOSIS — J189 Pneumonia, unspecified organism: Secondary | ICD-10-CM | POA: Diagnosis not present

## 2019-10-27 DIAGNOSIS — R5082 Postprocedural fever: Secondary | ICD-10-CM | POA: Diagnosis not present

## 2019-10-29 ENCOUNTER — Telehealth: Payer: Self-pay | Admitting: Adult Health

## 2019-10-29 NOTE — Telephone Encounter (Signed)
Called and spoke to patient.  Patient stated that she was dx with PNA on 10/26/2019 . Patient is questioning if she can continue to wear her cpap with PNA?  Tammy, please advise. Thanks

## 2019-10-29 NOTE — Telephone Encounter (Signed)
Called and spoke with patient to let her know of Tammys recs with her CPAP. She expressed understanding. Nothing further needed at this time.

## 2019-10-29 NOTE — Telephone Encounter (Signed)
Sorry to hear that she has a pneumonia.  Yes she can definitely wear her CPAP during pneumonia she does needs to keep her mask cleaned as usual along with her water chamber as well.  Please contact office for sooner follow up if symptoms do not improve or worsen or seek emergency care

## 2019-10-30 DIAGNOSIS — Z4789 Encounter for other orthopedic aftercare: Secondary | ICD-10-CM | POA: Diagnosis not present

## 2019-10-30 DIAGNOSIS — M5136 Other intervertebral disc degeneration, lumbar region: Secondary | ICD-10-CM | POA: Diagnosis not present

## 2019-10-30 DIAGNOSIS — M4802 Spinal stenosis, cervical region: Secondary | ICD-10-CM | POA: Diagnosis not present

## 2019-10-30 DIAGNOSIS — Z981 Arthrodesis status: Secondary | ICD-10-CM | POA: Diagnosis not present

## 2019-10-30 DIAGNOSIS — G43711 Chronic migraine without aura, intractable, with status migrainosus: Secondary | ICD-10-CM | POA: Diagnosis not present

## 2019-10-30 DIAGNOSIS — M4722 Other spondylosis with radiculopathy, cervical region: Secondary | ICD-10-CM | POA: Diagnosis not present

## 2019-11-01 DIAGNOSIS — Z981 Arthrodesis status: Secondary | ICD-10-CM | POA: Diagnosis not present

## 2019-11-01 DIAGNOSIS — M5136 Other intervertebral disc degeneration, lumbar region: Secondary | ICD-10-CM | POA: Diagnosis not present

## 2019-11-01 DIAGNOSIS — M4722 Other spondylosis with radiculopathy, cervical region: Secondary | ICD-10-CM | POA: Diagnosis not present

## 2019-11-01 DIAGNOSIS — M4802 Spinal stenosis, cervical region: Secondary | ICD-10-CM | POA: Diagnosis not present

## 2019-11-01 DIAGNOSIS — Z4789 Encounter for other orthopedic aftercare: Secondary | ICD-10-CM | POA: Diagnosis not present

## 2019-11-01 DIAGNOSIS — G43711 Chronic migraine without aura, intractable, with status migrainosus: Secondary | ICD-10-CM | POA: Diagnosis not present

## 2019-11-02 DIAGNOSIS — M5136 Other intervertebral disc degeneration, lumbar region: Secondary | ICD-10-CM | POA: Diagnosis not present

## 2019-11-02 DIAGNOSIS — Z981 Arthrodesis status: Secondary | ICD-10-CM | POA: Diagnosis not present

## 2019-11-02 DIAGNOSIS — M4802 Spinal stenosis, cervical region: Secondary | ICD-10-CM | POA: Diagnosis not present

## 2019-11-02 DIAGNOSIS — M4722 Other spondylosis with radiculopathy, cervical region: Secondary | ICD-10-CM | POA: Diagnosis not present

## 2019-11-02 DIAGNOSIS — Z4789 Encounter for other orthopedic aftercare: Secondary | ICD-10-CM | POA: Diagnosis not present

## 2019-11-02 DIAGNOSIS — G43711 Chronic migraine without aura, intractable, with status migrainosus: Secondary | ICD-10-CM | POA: Diagnosis not present

## 2019-11-05 DIAGNOSIS — M4802 Spinal stenosis, cervical region: Secondary | ICD-10-CM | POA: Diagnosis not present

## 2019-11-05 DIAGNOSIS — M4722 Other spondylosis with radiculopathy, cervical region: Secondary | ICD-10-CM | POA: Diagnosis not present

## 2019-11-05 DIAGNOSIS — G43711 Chronic migraine without aura, intractable, with status migrainosus: Secondary | ICD-10-CM | POA: Diagnosis not present

## 2019-11-05 DIAGNOSIS — M5136 Other intervertebral disc degeneration, lumbar region: Secondary | ICD-10-CM | POA: Diagnosis not present

## 2019-11-05 DIAGNOSIS — Z981 Arthrodesis status: Secondary | ICD-10-CM | POA: Diagnosis not present

## 2019-11-05 DIAGNOSIS — Z4789 Encounter for other orthopedic aftercare: Secondary | ICD-10-CM | POA: Diagnosis not present

## 2019-11-19 ENCOUNTER — Other Ambulatory Visit: Payer: Medicare Other

## 2019-11-20 DIAGNOSIS — M5416 Radiculopathy, lumbar region: Secondary | ICD-10-CM | POA: Diagnosis not present

## 2019-11-20 DIAGNOSIS — G8929 Other chronic pain: Secondary | ICD-10-CM | POA: Diagnosis not present

## 2019-11-20 DIAGNOSIS — M25561 Pain in right knee: Secondary | ICD-10-CM | POA: Diagnosis not present

## 2019-11-20 DIAGNOSIS — M5136 Other intervertebral disc degeneration, lumbar region: Secondary | ICD-10-CM | POA: Diagnosis not present

## 2019-11-20 DIAGNOSIS — M7062 Trochanteric bursitis, left hip: Secondary | ICD-10-CM | POA: Diagnosis not present

## 2019-11-20 DIAGNOSIS — M76891 Other specified enthesopathies of right lower limb, excluding foot: Secondary | ICD-10-CM | POA: Diagnosis not present

## 2019-11-28 ENCOUNTER — Ambulatory Visit: Payer: Medicare Other | Admitting: Radiation Oncology

## 2019-11-28 ENCOUNTER — Ambulatory Visit: Admission: RE | Admit: 2019-11-28 | Payer: Medicare Other | Source: Ambulatory Visit | Admitting: Radiation Oncology

## 2019-11-29 DIAGNOSIS — M4322 Fusion of spine, cervical region: Secondary | ICD-10-CM | POA: Diagnosis not present

## 2019-11-29 DIAGNOSIS — Z981 Arthrodesis status: Secondary | ICD-10-CM | POA: Diagnosis not present

## 2019-12-03 ENCOUNTER — Other Ambulatory Visit: Payer: Self-pay | Admitting: Neurology

## 2019-12-03 DIAGNOSIS — M9903 Segmental and somatic dysfunction of lumbar region: Secondary | ICD-10-CM | POA: Diagnosis not present

## 2019-12-03 DIAGNOSIS — M545 Low back pain, unspecified: Secondary | ICD-10-CM | POA: Diagnosis not present

## 2019-12-03 DIAGNOSIS — Z72820 Sleep deprivation: Secondary | ICD-10-CM | POA: Diagnosis not present

## 2019-12-03 DIAGNOSIS — M9904 Segmental and somatic dysfunction of sacral region: Secondary | ICD-10-CM | POA: Diagnosis not present

## 2019-12-04 ENCOUNTER — Ambulatory Visit (INDEPENDENT_AMBULATORY_CARE_PROVIDER_SITE_OTHER): Payer: Medicare Other | Admitting: Cardiology

## 2019-12-04 ENCOUNTER — Other Ambulatory Visit: Payer: Self-pay

## 2019-12-04 ENCOUNTER — Encounter: Payer: Self-pay | Admitting: Cardiology

## 2019-12-04 VITALS — BP 126/76 | HR 60 | Ht 66.0 in | Wt 227.0 lb

## 2019-12-04 DIAGNOSIS — I480 Paroxysmal atrial fibrillation: Secondary | ICD-10-CM | POA: Diagnosis not present

## 2019-12-04 NOTE — Addendum Note (Signed)
Addended by: Stanton Kidney on: 12/04/2019 10:00 AM   Modules accepted: Orders

## 2019-12-04 NOTE — Progress Notes (Signed)
Electrophysiology Office Note   Date:  12/04/2019   ID:  PRABHJOT MADDUX, DOB 1955-02-24, MRN [id number]  PCP:  Steele Sizer, MD  Cardiologist:  Rockey Situ Primary Electrophysiologist:  Nilaya Bouie Meredith Leeds, MD    Chief Complaint: AF   History of Present Illness: Sonya Lopez is a 64 y.o. female who is being seen today for the evaluation of AF at the request of Steele Sizer, MD. Presenting today for electrophysiology evaluation.  He has a history of diabetes, GERD, atrial fibrillation/flutter.  She also has sleep apnea and wears CPAP.  Atrial fibrillation was diagnosed in 2013 when she presented to the hospital.  She also required emergency room care April 2020.  At that point she stood up out of bed and had an episode of syncope.  She was taking both propranolol and flecainide but had not yet taken it.  She was found to be orthostatic and hypotensive.  She received IV fluids and was cardioverted.  She was switched to amiodarone.  She is now status post AF ablation 03/01/2019.  Today, denies symptoms of palpitations, chest pain, shortness of breath, orthopnea, PND, lower extremity edema, claudication, dizziness, presyncope, syncope, bleeding, or neurologic sequela. The patient is tolerating medications without difficulties.  Since last being seen she has done well.  She has had no chest pain or shortness of breath and has had no further episodes of atrial fibrillation.  She did have cervical spine fusion X7-L3 which was complicated by postoperative pneumonia.  Fortunately she had no atrial fibrillation during that time.   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  MammoSite   Cancer [facility]) 2017   Left- DCIS   Depression    Difficult intubation 2009   [place] had to use  'boogee" DURING GALLBLADDER SURGERY ONLY-NO PROBLEMS SINCE   Female cystocele 01/05/2011   Fibromyalgia    GERD (gastroesophageal reflux disease)    Headache(784.0)    MIGRAINES   History of kidney stones    Obesity    OSA (obstructive sleep apnea) 03/27/2019   USE CPAP   Personal history of radiation therapy 2017   LEFT lumpectomy   PONV (postoperative nausea and vomiting)    h/o difficult intubation and anes had to use "boogee"   Vitreous detachment of right eye    sees floaters   Past Surgical History:  Procedure Laterality Date   ABDOMINAL HYSTERECTOMY  2013   ANTERIOR AND POSTERIOR REPAIR  11/30/2011   Procedure: ANTERIOR (CYSTOCELE) AND POSTERIOR REPAIR (RECTOCELE);  Surgeon: Reece Packer, MD;  Location: [facility];  Service: Urology;  Laterality: N/A;  with cysto   ANTERIOR CERVICAL DECOMP/DISCECTOMY FUSION N/A 10/24/2019   Procedure: ANTERIOR CERVICAL DECOMPRESSION/DISCECTOMY FUSION 2 LEVELS C5-7;  Surgeon: Meade Maw, MD;  Location: [facility];  Service: Neurosurgery;  Laterality: N/A;   ATRIAL FIBRILLATION ABLATION N/A 03/01/2019   Procedure: ATRIAL FIBRILLATION ABLATION;  Surgeon: Constance Haw, MD;  Location: [facility];  Service: Cardiovascular;  Laterality: N/A;   BLADDER SUSPENSION  11/30/2011   Procedure: [place] PROCEDURE;  Surgeon: Reece Packer, MD;  Location: [facility];  Service: Urology;  Laterality: N/A;  graft 10x6   BREAST BIOPSY Right 2007   negative core  BREAST BIOPSY Left 09/16/2015   DCIS   BREAST LUMPECTOMY Left 09/30/2015   DCIS clear margins and rad tx/HIGH GRADE DUCTAL CARCINOMA IN SITU, COMEDO TYPE   BREAST LUMPECTOMY WITH NEEDLE LOCALIZATION Left 09/30/2015   DCIS excision, MammoSite radiation;  Surgeon: Robert Bellow, MD;  Location: [facility];  Service: General;  Laterality: Left;   CATARACT EXTRACTION Left 01/2019   CESAREAN SECTION     x 1   CHOLECYSTECTOMY   2005   COLONOSCOPY  2009   CYSTOSCOPY  11/30/2011   Procedure: CYSTOSCOPY;  Surgeon: Reece Packer, MD;  Location: [facility];  Service: Urology;;   [place] OF UTERUS  05/05/2007   GALLBLADDER SURGERY  2004   TONSILLECTOMY  1961   VAGINAL HYSTERECTOMY  11/30/2011   Procedure: HYSTERECTOMY VAGINAL;  Surgeon: Emily Filbert, MD;  Location: [facility];  Service: Gynecology;  Laterality: N/A;     Current Outpatient Medications  Medication Sig Dispense Refill   acetaminophen (TYLENOL) 500 MG tablet Take 2 tablets (1,000 mg total) by mouth every 8 (eight) hours. OTC  0   amiodarone (PACERONE) 200 MG tablet Take 200 mg by mouth daily as needed (afib).     Cholecalciferol (PA VITAMIN D-3 GUMMY PO) Takes 2 gummies daily     Cyanocobalamin (VITAMIN B-12 PO) Place 1 tablet under the tongue daily.      DULoxetine (CYMBALTA) 20 MG capsule Take 2 capsules (40 mg total) by mouth daily. 180 capsule 1   FINACEA 15 % FOAM Apply 1 application topically daily as needed (rosacea).      Fremanezumab-vfrm (AJOVY) 225 MG/1.5ML SOSY Inject 225 mg into the skin every 30 (thirty) days. 6 Syringe 0   gabapentin (NEURONTIN) 300 MG capsule Take 2 capsules (600 mg total) by mouth at bedtime. 180 capsule 1   magnesium oxide (MAG-OX) 400 MG tablet Take 400 mg by mouth every morning.      Menthol-Methyl Salicylate (SALONPAS PAIN RELIEF PATCH EX) Place 1 patch onto the skin daily as needed (pain).     methocarbamol (ROBAXIN) 500 MG tablet Take 500 mg by mouth every 6 (six) hours as needed for muscle spasms.     metoprolol succinate (TOPROL-XL) 25 MG 24 hr tablet Take 1 tablet (25 mg total) by mouth daily. 90 tablet 0   omeprazole (PRILOSEC) 20 MG capsule Take 20 mg by mouth daily.     Probiotic Product (PROBIOTIC PO) Take 1 capsule by mouth daily.      propranolol (INDERAL) 10 MG tablet TAKE ONE TABLET EVERY DAY WHEN TAKING EXTRA AMIODARONE FOR BREAKTHROUGH A-FIB      rizatriptan (MAXALT-MLT)  10 MG disintegrating tablet Take 1 tablet (10 mg total) by mouth as needed for migraine. May repeat in 2 hours if needed 9 tablet 11   trolamine salicylate (ASPERCREME) 10 % cream Apply 1 application topically as needed for muscle pain.     No current facility-administered medications for this visit.    Allergies:   Adhesive [tape], Macrodantin [nitrofurantoin], Metronidazole, Nitrofurantoin macrocrystal, Sulfamethoxazole-trimethoprim, Cephalexin, Clindamycin hcl, Doxycycline, and Erythromycin   Social History:  The patient  reports that she has never smoked. She has never used smokeless tobacco. She reports previous alcohol use. She reports that she does not use drugs.   Family History:  [name] family history includes Asthma in her daughter, daughter, and daughter; Colitis in her daughter; Diabetes in her brother and mother; Fibromyalgia in her daughter; Hearing loss in her mother; Heart disease in  her father and paternal grandfather; Hypertension in her brother and mother; Interstitial cystitis in her daughter; Migraines in her daughter; Thyroid cancer (age of onset: 65) in her daughter.    ROS:  Please see the history of present illness.   Otherwise, review of systems is positive for none.   All other systems are reviewed and negative.   PHYSICAL EXAM: VS:  BP 126/76    Pulse 60    Ht 5\' 6"  (1.676 m)    Wt 227 lb (103 kg)    SpO2 91%    BMI 36.64 kg/m  , BMI Body mass index is 36.64 kg/m. GEN: Well nourished, well developed, in no acute distress  HEENT: normal  Neck: no JVD, carotid bruits, or masses Cardiac: RRR; no murmurs, rubs, or gallops,no edema  Respiratory:  clear to auscultation bilaterally, normal work of breathing GI: soft, nontender, nondistended, + BS MS: no deformity or atrophy  Skin: warm and dry Neuro:  Strength and sensation are intact Psych: euthymic mood, full affect  EKG:  EKG is ordered today. Personal review of the ekg ordered shows sinus rhythm, rate  sixty  Recent Labs: 03/29/2019: TSH 2.958 09/11/2019: ALT 14 10/17/2019: BUN 17; Creatinine, Ser 0.98; Hemoglobin 11.5; Platelets 304; Potassium 4.0; Sodium 139    Lipid Panel     Component Value Date/Time   CHOL 196 09/11/2019 1109   CHOL 209 (H) 06/04/2015 0753   CHOL 164 04/17/2011 0507   TRIG 86 09/11/2019 1109   TRIG 83 04/17/2011 0507   HDL 68 09/11/2019 1109   HDL 58 06/04/2015 0753   HDL 43 04/17/2011 0507   CHOLHDL 2.9 09/11/2019 1109   VLDL 15 01/05/2016 0828   VLDL 17 04/17/2011 0507   LDLCALC 110 (H) 09/11/2019 1109   LDLCALC 104 (H) 04/17/2011 0507     Wt Readings from Last 3 Encounters:  12/04/19 227 lb (103 kg)  10/24/19 231 lb 7.7 oz (105 kg)  10/17/19 232 lb 2.3 oz (105.3 kg)      Other studies Reviewed: Additional studies/ records that were reviewed today include: ETT 09/29/17 personally reviewed  Review of the above records today demonstrates:  Normal exercise treadmill study Good exercise tolerance Target heart rate achieved Appropriate recovery after 3 minutes   ASSESSMENT AND PLAN:  1.  Paroxysmal atrial fibrillation/flutter: Status post ablation 03/01/2019.  Currently on Eliquis with a CHA2DS2-VASc of at least 2.  She had one episode of atrial fibrillation in March, but no further.  She takes amiodarone as needed (monitoring for high risk medication), but has not needed it.  We Sharniece Gibbon stop her amiodarone.  She did have cervical spine fusion which was complicated by postoperative pneumonia.  She did not have any atrial fibrillation during that time.  2.  Hyperlipidemia: Weight loss encouraged.  Plan per primary cardiology.  3.  Obstructive sleep apnea: CPAP compliance encouraged  Current medicines are reviewed at length with the patient today.   The patient does not have concerns regarding her medicines.  The following changes were made today: Stop amiodarone  Labs/ tests ordered today include:  Orders Placed This Encounter  Procedures   EKG  12-Lead    Disposition:   FU with Rahmon Heigl six months  Signed, Mardell Suttles Meredith Leeds, MD  12/04/2019 9:34 AM     [place] [address] [phone] (office) [phone] (fax)

## 2019-12-05 DIAGNOSIS — M545 Low back pain, unspecified: Secondary | ICD-10-CM | POA: Diagnosis not present

## 2019-12-05 DIAGNOSIS — M9904 Segmental and somatic dysfunction of sacral region: Secondary | ICD-10-CM | POA: Diagnosis not present

## 2019-12-05 DIAGNOSIS — M9903 Segmental and somatic dysfunction of lumbar region: Secondary | ICD-10-CM | POA: Diagnosis not present

## 2019-12-05 DIAGNOSIS — Z72820 Sleep deprivation: Secondary | ICD-10-CM | POA: Diagnosis not present

## 2019-12-05 NOTE — Telephone Encounter (Signed)
Per  registry, pt last filled Tylenol #3 on 11/05/2019. She also filled Oxycodone 5 mg #30 on 10/24/19 post ACDF. Will send to Dr Jaynee Eagles to evaluate if ok to refill tylenol #3.

## 2019-12-07 DIAGNOSIS — Z72820 Sleep deprivation: Secondary | ICD-10-CM | POA: Diagnosis not present

## 2019-12-07 DIAGNOSIS — M545 Low back pain, unspecified: Secondary | ICD-10-CM | POA: Diagnosis not present

## 2019-12-07 DIAGNOSIS — M9904 Segmental and somatic dysfunction of sacral region: Secondary | ICD-10-CM | POA: Diagnosis not present

## 2019-12-07 DIAGNOSIS — M9903 Segmental and somatic dysfunction of lumbar region: Secondary | ICD-10-CM | POA: Diagnosis not present

## 2019-12-10 DIAGNOSIS — Z72820 Sleep deprivation: Secondary | ICD-10-CM | POA: Diagnosis not present

## 2019-12-10 DIAGNOSIS — M9904 Segmental and somatic dysfunction of sacral region: Secondary | ICD-10-CM | POA: Diagnosis not present

## 2019-12-10 DIAGNOSIS — M9903 Segmental and somatic dysfunction of lumbar region: Secondary | ICD-10-CM | POA: Diagnosis not present

## 2019-12-10 DIAGNOSIS — M545 Low back pain, unspecified: Secondary | ICD-10-CM | POA: Diagnosis not present

## 2019-12-10 DIAGNOSIS — Z23 Encounter for immunization: Secondary | ICD-10-CM | POA: Diagnosis not present

## 2019-12-12 DIAGNOSIS — M9903 Segmental and somatic dysfunction of lumbar region: Secondary | ICD-10-CM | POA: Diagnosis not present

## 2019-12-12 DIAGNOSIS — M9904 Segmental and somatic dysfunction of sacral region: Secondary | ICD-10-CM | POA: Diagnosis not present

## 2019-12-12 DIAGNOSIS — M545 Low back pain, unspecified: Secondary | ICD-10-CM | POA: Diagnosis not present

## 2019-12-12 DIAGNOSIS — Z72820 Sleep deprivation: Secondary | ICD-10-CM | POA: Diagnosis not present

## 2019-12-14 DIAGNOSIS — M545 Low back pain, unspecified: Secondary | ICD-10-CM | POA: Diagnosis not present

## 2019-12-14 DIAGNOSIS — M9904 Segmental and somatic dysfunction of sacral region: Secondary | ICD-10-CM | POA: Diagnosis not present

## 2019-12-14 DIAGNOSIS — M9903 Segmental and somatic dysfunction of lumbar region: Secondary | ICD-10-CM | POA: Diagnosis not present

## 2019-12-14 DIAGNOSIS — Z72820 Sleep deprivation: Secondary | ICD-10-CM | POA: Diagnosis not present

## 2019-12-17 DIAGNOSIS — Z72820 Sleep deprivation: Secondary | ICD-10-CM | POA: Diagnosis not present

## 2019-12-17 DIAGNOSIS — M545 Low back pain, unspecified: Secondary | ICD-10-CM | POA: Diagnosis not present

## 2019-12-17 DIAGNOSIS — M9904 Segmental and somatic dysfunction of sacral region: Secondary | ICD-10-CM | POA: Diagnosis not present

## 2019-12-17 DIAGNOSIS — M9903 Segmental and somatic dysfunction of lumbar region: Secondary | ICD-10-CM | POA: Diagnosis not present

## 2019-12-18 ENCOUNTER — Ambulatory Visit
Admission: RE | Admit: 2019-12-18 | Discharge: 2019-12-18 | Disposition: A | Discharge status: Home / Self Care | Payer: Medicare Other | Source: Ambulatory Visit | Attending: General Surgery | Admitting: General Surgery

## 2019-12-18 ENCOUNTER — Other Ambulatory Visit: Payer: Self-pay

## 2019-12-18 DIAGNOSIS — Z780 Asymptomatic menopausal state: Secondary | ICD-10-CM | POA: Diagnosis not present

## 2019-12-18 DIAGNOSIS — Z853 Personal history of malignant neoplasm of breast: Secondary | ICD-10-CM

## 2019-12-18 DIAGNOSIS — R922 Inconclusive mammogram: Secondary | ICD-10-CM | POA: Diagnosis not present

## 2019-12-18 DIAGNOSIS — Z9012 Acquired absence of left breast and nipple: Secondary | ICD-10-CM | POA: Diagnosis not present

## 2019-12-18 DIAGNOSIS — Z79811 Long term (current) use of aromatase inhibitors: Secondary | ICD-10-CM | POA: Insufficient documentation

## 2019-12-18 DIAGNOSIS — Z13820 Encounter for screening for osteoporosis: Secondary | ICD-10-CM | POA: Insufficient documentation

## 2019-12-18 DIAGNOSIS — E2839 Other primary ovarian failure: Secondary | ICD-10-CM | POA: Diagnosis not present

## 2019-12-19 DIAGNOSIS — Z72820 Sleep deprivation: Secondary | ICD-10-CM | POA: Diagnosis not present

## 2019-12-19 DIAGNOSIS — M545 Low back pain, unspecified: Secondary | ICD-10-CM | POA: Diagnosis not present

## 2019-12-19 DIAGNOSIS — M9903 Segmental and somatic dysfunction of lumbar region: Secondary | ICD-10-CM | POA: Diagnosis not present

## 2019-12-19 DIAGNOSIS — M9904 Segmental and somatic dysfunction of sacral region: Secondary | ICD-10-CM | POA: Diagnosis not present

## 2019-12-21 DIAGNOSIS — M9903 Segmental and somatic dysfunction of lumbar region: Secondary | ICD-10-CM | POA: Diagnosis not present

## 2019-12-21 DIAGNOSIS — Z72820 Sleep deprivation: Secondary | ICD-10-CM | POA: Diagnosis not present

## 2019-12-21 DIAGNOSIS — M9904 Segmental and somatic dysfunction of sacral region: Secondary | ICD-10-CM | POA: Diagnosis not present

## 2019-12-21 DIAGNOSIS — M545 Low back pain, unspecified: Secondary | ICD-10-CM | POA: Diagnosis not present

## 2019-12-24 ENCOUNTER — Ambulatory Visit
Admission: RE | Admit: 2019-12-24 | Discharge: 2019-12-24 | Disposition: A | Discharge status: Home / Self Care | Payer: Medicare Other | Source: Ambulatory Visit | Attending: Radiation Oncology | Admitting: Radiation Oncology

## 2019-12-24 ENCOUNTER — Other Ambulatory Visit: Payer: Self-pay

## 2019-12-24 ENCOUNTER — Encounter: Payer: Self-pay | Admitting: Radiation Oncology

## 2019-12-24 VITALS — BP 124/55 | HR 68 | Temp 97.3°F | Wt 230.0 lb

## 2019-12-24 DIAGNOSIS — Z86000 Personal history of in-situ neoplasm of breast: Secondary | ICD-10-CM | POA: Diagnosis not present

## 2019-12-24 DIAGNOSIS — Z170 Estrogen receptor positive status [ER+]: Secondary | ICD-10-CM

## 2019-12-24 DIAGNOSIS — C50412 Malignant neoplasm of upper-outer quadrant of left female breast: Secondary | ICD-10-CM

## 2019-12-24 NOTE — Progress Notes (Signed)
Radiation Oncology Follow up Note  Name: Sonya Lopez   Date:   12/24/2019 MRN:  [id number] DOB: 1955/08/18    This 64 y.o. female presents to the clinic today for 4-year follow-up status post accelerated partial breast radiation to her left breast for ER/PR positive ductal carcinoma in situ.  REFERRING PROVIDER: Steele Sizer, MD  HPI: Patient is a 64 year old female now out over 4 years having completed accelerated partial breast radiation to her left breast for ER/PR positive ductal carcinoma in situ.  Seen today in routine follow-up she is doing well.  She specifically denies breast tenderness cough or bone pain..  She had mammograms this month which I have reviewed were BI-RADS 2 benign.  She was on antiestrogen therapy although that has been discontinued based on her side effect profile.  COMPLICATIONS OF TREATMENT: none  FOLLOW UP COMPLIANCE: keeps appointments   PHYSICAL EXAM:  BP (!) 124/55   Pulse 68   Temp (!) 97.3 F (36.3 C) (Tympanic)   Wt 230 lb (104.3 kg)   BMI 37.12 kg/m  Lungs are clear to A&P cardiac examination essentially unremarkable with regular rate and rhythm. No dominant mass or nodularity is noted in either breast in 2 positions examined. Incision is well-healed. No axillary or supraclavicular adenopathy is appreciated. Cosmetic result is excellent.  Well-developed well-nourished patient in NAD. HEENT reveals PERLA, EOMI, discs not visualized.  Oral cavity is clear. No oral mucosal lesions are identified. Neck is clear without evidence of cervical or supraclavicular adenopathy. Lungs are clear to A&P. Cardiac examination is essentially unremarkable with regular rate and rhythm without murmur rub or thrill. Abdomen is benign with no organomegaly or masses noted. Motor sensory and DTR levels are equal and symmetric in the upper and lower extremities. Cranial nerves II through XII are grossly intact. Proprioception is intact. No peripheral adenopathy or edema  is identified. No motor or sensory levels are noted. Crude visual fields are within normal range.  RADIOLOGY RESULTS: Mammograms reviewed compatible with above-stated findings  PLAN: This time patient is now out over 4 years having completed accelerated partial breast radiation for ductal carcinoma in situ.  She continues close follow-up care with Dr. Tollie Pizza.  At this time I am going to discontinue follow-up care.  Be happy to reevaluate the patient or answer any questions in the future.  I would like to take this opportunity to thank you for allowing me to participate in the care of your patient.Noreene Filbert, MD

## 2019-12-25 DIAGNOSIS — D0512 Intraductal carcinoma in situ of left breast: Secondary | ICD-10-CM | POA: Diagnosis not present

## 2019-12-26 DIAGNOSIS — M545 Low back pain, unspecified: Secondary | ICD-10-CM | POA: Diagnosis not present

## 2019-12-26 DIAGNOSIS — Z72820 Sleep deprivation: Secondary | ICD-10-CM | POA: Diagnosis not present

## 2019-12-26 DIAGNOSIS — M9903 Segmental and somatic dysfunction of lumbar region: Secondary | ICD-10-CM | POA: Diagnosis not present

## 2019-12-26 DIAGNOSIS — M9904 Segmental and somatic dysfunction of sacral region: Secondary | ICD-10-CM | POA: Diagnosis not present

## 2019-12-29 ENCOUNTER — Other Ambulatory Visit: Payer: Self-pay | Admitting: Family Medicine

## 2019-12-29 DIAGNOSIS — M797 Fibromyalgia: Secondary | ICD-10-CM

## 2019-12-31 DIAGNOSIS — M545 Low back pain, unspecified: Secondary | ICD-10-CM | POA: Diagnosis not present

## 2019-12-31 DIAGNOSIS — M9903 Segmental and somatic dysfunction of lumbar region: Secondary | ICD-10-CM | POA: Diagnosis not present

## 2019-12-31 DIAGNOSIS — Z72820 Sleep deprivation: Secondary | ICD-10-CM | POA: Diagnosis not present

## 2019-12-31 DIAGNOSIS — M9904 Segmental and somatic dysfunction of sacral region: Secondary | ICD-10-CM | POA: Diagnosis not present

## 2020-01-10 DIAGNOSIS — Z981 Arthrodesis status: Secondary | ICD-10-CM | POA: Diagnosis not present

## 2020-01-10 DIAGNOSIS — M4322 Fusion of spine, cervical region: Secondary | ICD-10-CM | POA: Diagnosis not present

## 2020-01-15 DIAGNOSIS — M25512 Pain in left shoulder: Secondary | ICD-10-CM | POA: Diagnosis not present

## 2020-01-15 DIAGNOSIS — G8929 Other chronic pain: Secondary | ICD-10-CM | POA: Diagnosis not present

## 2020-01-15 DIAGNOSIS — M62838 Other muscle spasm: Secondary | ICD-10-CM | POA: Diagnosis not present

## 2020-01-15 DIAGNOSIS — M7062 Trochanteric bursitis, left hip: Secondary | ICD-10-CM | POA: Diagnosis not present

## 2020-01-15 DIAGNOSIS — M25552 Pain in left hip: Secondary | ICD-10-CM | POA: Diagnosis not present

## 2020-01-15 DIAGNOSIS — M1612 Unilateral primary osteoarthritis, left hip: Secondary | ICD-10-CM | POA: Diagnosis not present

## 2020-01-15 DIAGNOSIS — M76892 Other specified enthesopathies of left lower limb, excluding foot: Secondary | ICD-10-CM | POA: Diagnosis not present

## 2020-01-21 ENCOUNTER — Other Ambulatory Visit: Payer: Self-pay | Admitting: Family Medicine

## 2020-01-21 NOTE — Telephone Encounter (Signed)
  Notes to clinic:  medication not on current medication list    Requested Prescriptions  Pending Prescriptions Disp Refills   naratriptan (AMERGE) 2.5 MG tablet [Pharmacy Med Name: NARATRIPTAN HCL 2.5 MG TAB] 9 tablet     Sig: TAKE ONE TABLET AT ONSET OF HEADACHE MAYREPEAT IN 4 HOURS IF HEADACHE RETURNS ORDOES NOT RESOLVE (MAX OF 2 PER DAY)      Neurology:  Migraine Therapy - Triptan Passed - 01/21/2020 10:57 AM      Passed - Last BP in normal range    BP Readings from Last 1 Encounters:  12/24/19 (!) 124/55          Passed - Valid encounter within last 12 months    Recent Outpatient Visits           4 months ago History of diabetes mellitus, type [facility], Drue Stager, MD   10 months ago OSA (obstructive sleep apnea)   [facility] Steele Sizer, MD   1 year ago Mild major depression [facility])   [facility] Steele Sizer, MD   1 year ago [facility] to [facility] preventive visit   [facility] Steele Sizer, MD   1 year ago Diet-controlled type 2 diabetes mellitus [facility])   [facility] Steele Sizer, MD       Future Appointments             In 1 month Ancil Boozer, Drue Stager, MD [facility], [place]   In 3 months  [facility], [facility]

## 2020-01-22 ENCOUNTER — Other Ambulatory Visit: Payer: Self-pay | Admitting: Neurology

## 2020-01-23 ENCOUNTER — Other Ambulatory Visit: Payer: Self-pay | Admitting: Neurology

## 2020-01-23 ENCOUNTER — Telehealth: Payer: Self-pay | Admitting: Neurology

## 2020-01-23 DIAGNOSIS — G9782 Other postprocedural complications and disorders of nervous system: Secondary | ICD-10-CM

## 2020-01-23 DIAGNOSIS — R221 Localized swelling, mass and lump, neck: Secondary | ICD-10-CM

## 2020-01-23 DIAGNOSIS — R519 Headache, unspecified: Secondary | ICD-10-CM

## 2020-01-23 DIAGNOSIS — Z09 Encounter for follow-up examination after completed treatment for conditions other than malignant neoplasm: Secondary | ICD-10-CM

## 2020-01-23 NOTE — Telephone Encounter (Signed)
Medicare/everest order sent to GI. No auth they will reach out to the patient to schedule.  °

## 2020-02-05 DIAGNOSIS — M62838 Other muscle spasm: Secondary | ICD-10-CM | POA: Diagnosis not present

## 2020-02-05 DIAGNOSIS — M25652 Stiffness of left hip, not elsewhere classified: Secondary | ICD-10-CM | POA: Diagnosis not present

## 2020-02-05 DIAGNOSIS — M6281 Muscle weakness (generalized): Secondary | ICD-10-CM | POA: Diagnosis not present

## 2020-02-05 DIAGNOSIS — M7062 Trochanteric bursitis, left hip: Secondary | ICD-10-CM | POA: Diagnosis not present

## 2020-02-05 DIAGNOSIS — M76892 Other specified enthesopathies of left lower limb, excluding foot: Secondary | ICD-10-CM | POA: Diagnosis not present

## 2020-02-05 DIAGNOSIS — M25552 Pain in left hip: Secondary | ICD-10-CM | POA: Diagnosis not present

## 2020-02-06 ENCOUNTER — Ambulatory Visit
Admission: RE | Admit: 2020-02-06 | Discharge: 2020-02-06 | Disposition: A | Discharge status: Home / Self Care | Payer: Medicare Other | Source: Ambulatory Visit | Attending: Neurology | Admitting: Neurology

## 2020-02-06 ENCOUNTER — Other Ambulatory Visit: Payer: Self-pay

## 2020-02-06 DIAGNOSIS — Z09 Encounter for follow-up examination after completed treatment for conditions other than malignant neoplasm: Secondary | ICD-10-CM

## 2020-02-06 DIAGNOSIS — R519 Headache, unspecified: Secondary | ICD-10-CM

## 2020-02-06 DIAGNOSIS — M4802 Spinal stenosis, cervical region: Secondary | ICD-10-CM | POA: Diagnosis not present

## 2020-02-06 DIAGNOSIS — R0989 Other specified symptoms and signs involving the circulatory and respiratory systems: Secondary | ICD-10-CM | POA: Diagnosis not present

## 2020-02-06 DIAGNOSIS — R221 Localized swelling, mass and lump, neck: Secondary | ICD-10-CM

## 2020-02-06 DIAGNOSIS — R200 Anesthesia of skin: Secondary | ICD-10-CM

## 2020-02-06 DIAGNOSIS — G9782 Other postprocedural complications and disorders of nervous system: Secondary | ICD-10-CM

## 2020-02-07 DIAGNOSIS — M62838 Other muscle spasm: Secondary | ICD-10-CM | POA: Diagnosis not present

## 2020-02-07 DIAGNOSIS — M7062 Trochanteric bursitis, left hip: Secondary | ICD-10-CM | POA: Diagnosis not present

## 2020-02-07 DIAGNOSIS — M25652 Stiffness of left hip, not elsewhere classified: Secondary | ICD-10-CM | POA: Diagnosis not present

## 2020-02-07 DIAGNOSIS — M25552 Pain in left hip: Secondary | ICD-10-CM | POA: Diagnosis not present

## 2020-02-07 DIAGNOSIS — M76892 Other specified enthesopathies of left lower limb, excluding foot: Secondary | ICD-10-CM | POA: Diagnosis not present

## 2020-02-07 DIAGNOSIS — M6281 Muscle weakness (generalized): Secondary | ICD-10-CM | POA: Diagnosis not present

## 2020-02-12 DIAGNOSIS — M62838 Other muscle spasm: Secondary | ICD-10-CM | POA: Diagnosis not present

## 2020-02-12 DIAGNOSIS — M25652 Stiffness of left hip, not elsewhere classified: Secondary | ICD-10-CM | POA: Diagnosis not present

## 2020-02-12 DIAGNOSIS — M76892 Other specified enthesopathies of left lower limb, excluding foot: Secondary | ICD-10-CM | POA: Diagnosis not present

## 2020-02-12 DIAGNOSIS — M7062 Trochanteric bursitis, left hip: Secondary | ICD-10-CM | POA: Diagnosis not present

## 2020-02-12 DIAGNOSIS — M6281 Muscle weakness (generalized): Secondary | ICD-10-CM | POA: Diagnosis not present

## 2020-02-12 DIAGNOSIS — M25552 Pain in left hip: Secondary | ICD-10-CM | POA: Diagnosis not present

## 2020-02-14 DIAGNOSIS — M6281 Muscle weakness (generalized): Secondary | ICD-10-CM | POA: Diagnosis not present

## 2020-02-14 DIAGNOSIS — M62838 Other muscle spasm: Secondary | ICD-10-CM | POA: Diagnosis not present

## 2020-02-14 DIAGNOSIS — M76892 Other specified enthesopathies of left lower limb, excluding foot: Secondary | ICD-10-CM | POA: Diagnosis not present

## 2020-02-14 DIAGNOSIS — M25652 Stiffness of left hip, not elsewhere classified: Secondary | ICD-10-CM | POA: Diagnosis not present

## 2020-02-14 DIAGNOSIS — M7062 Trochanteric bursitis, left hip: Secondary | ICD-10-CM | POA: Diagnosis not present

## 2020-02-14 DIAGNOSIS — M25552 Pain in left hip: Secondary | ICD-10-CM | POA: Diagnosis not present

## 2020-02-19 DIAGNOSIS — M25552 Pain in left hip: Secondary | ICD-10-CM | POA: Diagnosis not present

## 2020-02-19 DIAGNOSIS — M7062 Trochanteric bursitis, left hip: Secondary | ICD-10-CM | POA: Diagnosis not present

## 2020-02-19 DIAGNOSIS — M62838 Other muscle spasm: Secondary | ICD-10-CM | POA: Diagnosis not present

## 2020-02-19 DIAGNOSIS — M25652 Stiffness of left hip, not elsewhere classified: Secondary | ICD-10-CM | POA: Diagnosis not present

## 2020-02-19 DIAGNOSIS — M76892 Other specified enthesopathies of left lower limb, excluding foot: Secondary | ICD-10-CM | POA: Diagnosis not present

## 2020-02-19 DIAGNOSIS — M6281 Muscle weakness (generalized): Secondary | ICD-10-CM | POA: Diagnosis not present

## 2020-02-21 DIAGNOSIS — M25652 Stiffness of left hip, not elsewhere classified: Secondary | ICD-10-CM | POA: Diagnosis not present

## 2020-02-21 DIAGNOSIS — M25552 Pain in left hip: Secondary | ICD-10-CM | POA: Diagnosis not present

## 2020-02-21 DIAGNOSIS — M7062 Trochanteric bursitis, left hip: Secondary | ICD-10-CM | POA: Diagnosis not present

## 2020-02-21 DIAGNOSIS — M6281 Muscle weakness (generalized): Secondary | ICD-10-CM | POA: Diagnosis not present

## 2020-02-21 DIAGNOSIS — M62838 Other muscle spasm: Secondary | ICD-10-CM | POA: Diagnosis not present

## 2020-02-21 DIAGNOSIS — M76892 Other specified enthesopathies of left lower limb, excluding foot: Secondary | ICD-10-CM | POA: Diagnosis not present

## 2020-02-26 DIAGNOSIS — M76892 Other specified enthesopathies of left lower limb, excluding foot: Secondary | ICD-10-CM | POA: Diagnosis not present

## 2020-02-26 DIAGNOSIS — M62838 Other muscle spasm: Secondary | ICD-10-CM | POA: Diagnosis not present

## 2020-02-26 DIAGNOSIS — M6281 Muscle weakness (generalized): Secondary | ICD-10-CM | POA: Diagnosis not present

## 2020-02-26 DIAGNOSIS — M47816 Spondylosis without myelopathy or radiculopathy, lumbar region: Secondary | ICD-10-CM | POA: Diagnosis not present

## 2020-02-26 DIAGNOSIS — M7062 Trochanteric bursitis, left hip: Secondary | ICD-10-CM | POA: Diagnosis not present

## 2020-02-26 DIAGNOSIS — M1612 Unilateral primary osteoarthritis, left hip: Secondary | ICD-10-CM | POA: Diagnosis not present

## 2020-02-26 DIAGNOSIS — M25652 Stiffness of left hip, not elsewhere classified: Secondary | ICD-10-CM | POA: Diagnosis not present

## 2020-02-26 DIAGNOSIS — M25552 Pain in left hip: Secondary | ICD-10-CM | POA: Diagnosis not present

## 2020-03-04 DIAGNOSIS — M7062 Trochanteric bursitis, left hip: Secondary | ICD-10-CM | POA: Diagnosis not present

## 2020-03-04 DIAGNOSIS — M76892 Other specified enthesopathies of left lower limb, excluding foot: Secondary | ICD-10-CM | POA: Diagnosis not present

## 2020-03-04 DIAGNOSIS — M25552 Pain in left hip: Secondary | ICD-10-CM | POA: Diagnosis not present

## 2020-03-04 DIAGNOSIS — M25652 Stiffness of left hip, not elsewhere classified: Secondary | ICD-10-CM | POA: Diagnosis not present

## 2020-03-04 DIAGNOSIS — M62838 Other muscle spasm: Secondary | ICD-10-CM | POA: Diagnosis not present

## 2020-03-04 DIAGNOSIS — M6281 Muscle weakness (generalized): Secondary | ICD-10-CM | POA: Diagnosis not present

## 2020-03-06 DIAGNOSIS — M6281 Muscle weakness (generalized): Secondary | ICD-10-CM | POA: Diagnosis not present

## 2020-03-06 DIAGNOSIS — M25552 Pain in left hip: Secondary | ICD-10-CM | POA: Diagnosis not present

## 2020-03-06 DIAGNOSIS — M25652 Stiffness of left hip, not elsewhere classified: Secondary | ICD-10-CM | POA: Diagnosis not present

## 2020-03-06 DIAGNOSIS — M7062 Trochanteric bursitis, left hip: Secondary | ICD-10-CM | POA: Diagnosis not present

## 2020-03-06 DIAGNOSIS — M62838 Other muscle spasm: Secondary | ICD-10-CM | POA: Diagnosis not present

## 2020-03-06 DIAGNOSIS — M76892 Other specified enthesopathies of left lower limb, excluding foot: Secondary | ICD-10-CM | POA: Diagnosis not present

## 2020-03-11 DIAGNOSIS — M6281 Muscle weakness (generalized): Secondary | ICD-10-CM | POA: Diagnosis not present

## 2020-03-11 DIAGNOSIS — M25652 Stiffness of left hip, not elsewhere classified: Secondary | ICD-10-CM | POA: Diagnosis not present

## 2020-03-11 DIAGNOSIS — M76892 Other specified enthesopathies of left lower limb, excluding foot: Secondary | ICD-10-CM | POA: Diagnosis not present

## 2020-03-11 DIAGNOSIS — M62838 Other muscle spasm: Secondary | ICD-10-CM | POA: Diagnosis not present

## 2020-03-11 DIAGNOSIS — M7062 Trochanteric bursitis, left hip: Secondary | ICD-10-CM | POA: Diagnosis not present

## 2020-03-11 DIAGNOSIS — M25552 Pain in left hip: Secondary | ICD-10-CM | POA: Diagnosis not present

## 2020-03-12 NOTE — Progress Notes (Signed)
Name: Sonya Lopez   MRN: [id number]    DOB: February 17, 1955   Date:03/13/2020       Progress Note  Subjective  Chief Complaint  Follow Up  HPI  OSA: Dr. Jaynee Eagles ordered sleep study 07/2018, showed mild OSA, but had hypoxia of 82% and is under the care of Pulmonologist group. She states since she has been using CPAP machine back in July 2020 , she is compliant with CPAP, no longer waking up frequently with headaches, sleeps better at night and wakes up feeling refreshed . She is very compliant with CPAP   FMS: She states that Cymbalta and Gabapentin seems to be helping with symptoms,she still has mental fogginess,and still aches but pain is better on medication, average 2/10   Bilateral hip pain: seen by [facility] and had PT ,she also has bursitis, she is now seeing Ortho at [facility] and is having PT with dry needling , she states pain has improved, pain is now 1/10 and aching, she continues to have some pain on left lateral thigh that is 5/10.   History of Atrial Ablation: she had Afib, she had syncope twice on April 4th, 2020 Dr. Rockey Situ saw her on April 8th, 2020 and since that time she has been on Amiodarone and metoprolol, but  symptoms did not improve about  two episodes per week that were lasting  about 2-3 hours therefore she was referred to Dr. Curt Bears and had an ablation done on 02/28/2018, she is still on Eliquis and metoprolol but has been doing, no chest pain, palpitation or syncope since.   Migraine Headaches:since started on Ajovy migraines has been controlled, she states migraines were under control, but has noticed increase in episodes over the past couple of months. Lasting more than one day. She has prn medications but did not work last month. She has follow up with Dr. Jaynee Eagles March 2022  Mild Major Depression:she is still taking Duloxetine,phq 9 is positive today, she states that she does not like that she has gained weight. Helped her brain storm ways to lose  weight.   History diabetes  : last abnormal A1C was many years ago. Last visit it went a little to 5.8 % , on diet only, we will remove diagnosis of diabetes to history of diabetes and monitorShe denies polyphagia, no polyuria or polydipsia.   GERD: under control with medication, she tries alternating Prilosec and otc medication   History of left breast cancer: she had lumpectomy, could not tolerate estrogen suppressive therapy.mammogramis up to date.she is seeing Dr. Fleet Contras . Unchanged   Morbid Obesity: she has DM, HTN , and BMI above 35. Discussed intermittent fasting , cutting down on snacking, and increase physical activity   DDD cervical spine: She had right upper extremity radiculitis, she has cervical fusion 10/2019 and radiculitis resolved, she has some pain on incision site but explained it may take one year   Patient Active Problem List   Diagnosis Date Noted   History of diabetes mellitus, type II 09/11/2019   OSA (obstructive sleep apnea) 03/27/2019   Central sleep apnea 07/06/2018   Chronic migraine without aura, with intractable migraine, so stated, with status migrainosus 06/20/2018   Syncope 05/10/2018   Pain, elbow joint 02/02/2018   PAF (paroxysmal atrial fibrillation) ([place]) 09/13/2017   Trochanteric bursitis of left hip 06/24/2017   Low back pain 02/22/2017   Benign breast cyst in female, right 05/25/2016   History of left breast cancer 09/16/2015   Long-term use  of high-risk medication 02/25/2015   Increased urinary frequency 08/01/2014   Mild major depression ([place]) 07/29/2014   Gastric reflux 07/29/2014   Chronic interstitial cystitis 07/29/2014   Calculus of kidney 07/29/2014   Allergic rhinitis 07/29/2014   Acne erythematosa 07/29/2014   Lumbosacral radiculitis 07/29/2014   Tinnitus 07/29/2014   Mixed incontinence, urge and stress (female) (female) 08/23/2013   Hyperlipidemia 04/25/2013   H/O: hysterectomy 11/30/2011    Atrial flutter ([place]) 05/03/2011   Bladder cystocele 01/05/2011   Migraine without aura and without status migrainosus, not intractable 12/29/2010   Fibromyalgia 12/29/2010   Obesity 12/29/2010   Migraine 12/29/2010    Past Surgical History:  Procedure Laterality Date   ABDOMINAL HYSTERECTOMY  2013   ANTERIOR AND POSTERIOR REPAIR  11/30/2011   Procedure: ANTERIOR (CYSTOCELE) AND POSTERIOR REPAIR (RECTOCELE);  Surgeon: Reece Packer, MD;  Location: [facility];  Service: Urology;  Laterality: N/A;  with cysto   ANTERIOR CERVICAL DECOMP/DISCECTOMY FUSION N/A 10/24/2019   Procedure: ANTERIOR CERVICAL DECOMPRESSION/DISCECTOMY FUSION 2 LEVELS C5-7;  Surgeon: Meade Maw, MD;  Location: [facility];  Service: Neurosurgery;  Laterality: N/A;   ATRIAL FIBRILLATION ABLATION N/A 03/01/2019   Procedure: ATRIAL FIBRILLATION ABLATION;  Surgeon: Constance Haw, MD;  Location: [place] [facility];  Service: Cardiovascular;  Laterality: N/A;   BLADDER SUSPENSION  11/30/2011   Procedure: [place] PROCEDURE;  Surgeon: Reece Packer, MD;  Location: [place] [facility];  Service: Urology;  Laterality: N/A;  graft 10x6   BREAST BIOPSY Right 2007   negative core   BREAST BIOPSY Left 09/16/2015   DCIS   BREAST LUMPECTOMY Left 09/30/2015   DCIS clear margins and rad tx/HIGH GRADE DUCTAL CARCINOMA IN SITU, COMEDO TYPE   BREAST LUMPECTOMY WITH NEEDLE LOCALIZATION Left 09/30/2015   DCIS excision, MammoSite radiation;  Surgeon: Robert Bellow, MD;  Location: [facility];  Service: General;  Laterality: Left;   CATARACT EXTRACTION Left 01/2019   CESAREAN SECTION     x 1   CHOLECYSTECTOMY  2005   COLONOSCOPY  2009   CYSTOSCOPY  11/30/2011   Procedure: CYSTOSCOPY;  Surgeon: Reece Packer, MD;  Location: [place] [facility];  Service: Urology;;   [place] OF UTERUS  05/05/2007   GALLBLADDER SURGERY  2004   TONSILLECTOMY  1961   VAGINAL HYSTERECTOMY  11/30/2011   Procedure: HYSTERECTOMY  VAGINAL;  Surgeon: Emily Filbert, MD;  Location: [facility] [facility];  Service: Gynecology;  Laterality: N/A;    Family History  Problem Relation Age of Onset   Diabetes Mother    Hearing loss Mother    Hypertension Mother    Heart disease Father    Heart disease Paternal Grandfather    Asthma Daughter    Asthma Daughter    Migraines Daughter    Fibromyalgia Daughter    Interstitial cystitis Daughter    Asthma Daughter    Colitis Daughter    Thyroid cancer Daughter 63   Diabetes Brother    Hypertension Brother    Breast cancer Neg Hx     Social History   Tobacco Use   Smoking status: Never Smoker   Smokeless tobacco: Never Used  Substance Use Topics   Alcohol use: Not Currently    Alcohol/week: 0.0 standard drinks    Comment: Due to AF     Current Outpatient Medications:    acetaminophen (TYLENOL) 500 MG tablet, Take 2 tablets (1,000 mg total) by mouth every 8 (eight) hours. OTC, Disp: , Rfl: 0   acetaminophen-codeine (  TYLENOL #3) 300-30 MG tablet, TAKE ONE TABLET BY MOUTH EVERY 4 HOURS AS NEEDED FOR MODERATE OR SEVERE PAIN, Disp: 30 tablet, Rfl: 1   Cholecalciferol (PA VITAMIN D-3 GUMMY PO), Takes 2 gummies daily, Disp: , Rfl:    Cyanocobalamin (VITAMIN B-12 PO), Place 1 tablet under the tongue daily. , Disp: , Rfl:    ELIQUIS 5 MG TABS tablet, Take 5 mg by mouth 2 (two) times daily., Disp: , Rfl:    FINACEA 15 % FOAM, Apply 1 application topically daily as needed (rosacea). , Disp: , Rfl:    Fremanezumab-vfrm (AJOVY) 225 MG/1.5ML SOSY, Inject 225 mg into the skin every 30 (thirty) days., Disp: 6 Syringe, Rfl: 0   magnesium oxide (MAG-OX) 400 MG tablet, Take 400 mg by mouth every morning. , Disp: , Rfl:    Menthol-Methyl Salicylate (SALONPAS PAIN RELIEF PATCH EX), Place 1 patch onto the skin daily as needed (pain)., Disp: , Rfl:    metoprolol succinate (TOPROL-XL) 25 MG 24 hr tablet, Take 1 tablet (25 mg total) by mouth daily., Disp: 90 tablet, Rfl: 0    naratriptan (AMERGE) 2.5 MG tablet, TAKE ONE TABLET AT ONSET OF HEADACHE MAYREPEAT IN 4 HOURS IF HEADACHE RETURNS ORDOES NOT RESOLVE (MAX OF 2 PER DAY), Disp: 9 tablet, Rfl: 11   Probiotic Product (PROBIOTIC PO), Take 1 capsule by mouth daily. , Disp: , Rfl:    propranolol (INDERAL) 10 MG tablet, TAKE ONE TABLET EVERY DAY WHEN TAKING EXTRA AMIODARONE FOR BREAKTHROUGH A-FIB , Disp: , Rfl:    rizatriptan (MAXALT-MLT) 10 MG disintegrating tablet, Take 1 tablet (10 mg total) by mouth as needed for migraine. May repeat in 2 hours if needed, Disp: 9 tablet, Rfl: 11   trolamine salicylate (ASPERCREME) 10 % cream, Apply 1 application topically as needed for muscle pain., Disp: , Rfl:    DULoxetine (CYMBALTA) 20 MG capsule, Take 2 capsules (40 mg total) by mouth daily., Disp: 180 capsule, Rfl: 1   gabapentin (NEURONTIN) 300 MG capsule, Take 2 capsules (600 mg total) by mouth at bedtime., Disp: 180 capsule, Rfl: 1   methocarbamol (ROBAXIN) 500 MG tablet, Take 1 tablet (500 mg total) by mouth every 6 (six) hours as needed for muscle spasms., Disp: 90 tablet, Rfl: 0   omeprazole (PRILOSEC) 20 MG capsule, Take 1 capsule (20 mg total) by mouth daily., Disp: 90 capsule, Rfl: 1  Allergies  Allergen Reactions   Adhesive [Tape] Dermatitis    SKIN TURNS RED-PAPER TAPE OK TO USE   Macrodantin [Nitrofurantoin] Nausea And Vomiting   Metronidazole Nausea Only   Nitrofurantoin Macrocrystal Nausea And Vomiting   Sulfamethoxazole-Trimethoprim Nausea Only   Cephalexin Rash   Clindamycin Hcl Rash   Doxycycline Swelling and Rash   Erythromycin Rash    I personally reviewed active problem list, medication list, allergies, family history, social history, health maintenance with the patient/caregiver today.   ROS  Constitutional: Negative for fever, positive for  weight change.  Respiratory: Negative for cough and shortness of breath.   Cardiovascular: Negative for chest pain or palpitations.   Gastrointestinal: Negative for abdominal pain, no bowel changes.  Musculoskeletal: Negative for gait problem or joint swelling.  Skin: Negative for rash.  Neurological: Negative for dizziness , positive for intermittent headache.  No other specific complaints in a complete review of systems (except as listed in HPI above).   Objective  Vitals:   03/13/20 0902  BP: 128/72  Pulse: 70  Resp: 16  Temp: 98.1 F (36.7 C)  TempSrc: Oral  SpO2: 95%  Weight: 234 lb 9.6 oz (106.4 kg)  Height: 5\' 6"  (1.676 m)    Body mass index is 37.87 kg/m.  Physical Exam  Constitutional: Patient appears well-developed and well-nourished. Obese  No distress.  HEENT: head atraumatic, normocephalic, pupils equal and reactive to light,  neck supple Cardiovascular: Normal rate, regular rhythm and normal heart sounds.  No murmur heard. No BLE edema. Pulmonary/Chest: Effort normal and breath sounds normal. No respiratory distress. Abdominal: Soft.  There is no tenderness. Muscular skeletal: trigger point positive Psychiatric: Patient has a normal mood and affect. behavior is normal. Judgment and thought content normal.  PHQ2/9: Depression screen [facility] 2/9 03/13/2020 09/11/2019 04/20/2019 03/14/2019 09/01/2018  Decreased Interest 0 0 0 0 0  Down, Depressed, Hopeless 1 0 0 0 0  PHQ - 2 Score 1 0 0 0 0  Altered sleeping 0 1 - 3 0  Tired, decreased energy 1 1 - 0 0  Change in appetite 0 1 - 0 0  Feeling bad or failure about yourself  1 1 - 0 0  Trouble concentrating 0 0 - 0 0  Moving slowly or fidgety/restless 0 0 - 0 0  Suicidal thoughts 0 0 - 0 0  PHQ-9 Score 3 4 - 3 0  Difficult doing work/chores - Not difficult at all - Not difficult at all -  Some recent data might be hidden    phq 9 is positive  Fall Risk: Fall Risk  03/13/2020 09/11/2019 04/20/2019 03/14/2019 09/01/2018  Falls in the past year? 0 0 1 1 0  Number falls in past yr: 0 0 1 0 0  Comment - - - - -  Injury with Fall? 0 0 1 1 0  Comment  - - - - -  Risk for fall due to : - - History of fall(s) - -  Risk for fall due to: Comment - - - - -  Follow up - - Falls prevention discussed Falls evaluation completed -    Functional Status Survey: Is the patient deaf or have difficulty hearing?: No Does the patient have difficulty seeing, even when wearing glasses/contacts?: Yes Does the patient have difficulty concentrating, remembering, or making decisions?: No Does the patient have difficulty walking or climbing stairs?: Yes Does the patient have difficulty dressing or bathing?: No Does the patient have difficulty doing errands alone such as visiting a doctor's office or shopping?: No    Assessment & Plan  1. Fibromyalgia  - DULoxetine (CYMBALTA) 20 MG capsule; Take 2 capsules (40 mg total) by mouth daily.  Dispense: 180 capsule; Refill: 1 - methocarbamol (ROBAXIN) 500 MG tablet; Take 1 tablet (500 mg total) by mouth every 6 (six) hours as needed for muscle spasms.  Dispense: 90 tablet; Refill: 0  2. Cervical radiculopathy due to degenerative joint disease of spine  - gabapentin (NEURONTIN) 300 MG capsule; Take 2 capsules (600 mg total) by mouth at bedtime.  Dispense: 180 capsule; Refill: 1  3. Mild major depression (HCC)   4. Paroxysmal atrial fibrillation (HCC)  S/p ablation   5. B12 deficiency   6. Morbid obesity ([place])  Discussed with the patient the risk posed by an increased BMI. Discussed importance of portion control, calorie counting and at least 150 minutes of physical activity weekly. Avoid sweet beverages and drink more water. Eat at least 6 servings of fruit and vegetables daily   7. Migraine without aura and without status migrainosus, not intractable   8. OSA (  obstructive sleep apnea)   9. GERD without esophagitis  - omeprazole (PRILOSEC) 20 MG capsule; Take 1 capsule (20 mg total) by mouth daily.  Dispense: 90 capsule; Refill: 1

## 2020-03-13 ENCOUNTER — Encounter: Payer: Self-pay | Admitting: Family Medicine

## 2020-03-13 ENCOUNTER — Ambulatory Visit (INDEPENDENT_AMBULATORY_CARE_PROVIDER_SITE_OTHER): Payer: Medicare Other | Admitting: Family Medicine

## 2020-03-13 ENCOUNTER — Other Ambulatory Visit: Payer: Self-pay

## 2020-03-13 VITALS — BP 128/72 | HR 70 | Temp 98.1°F | Resp 16 | Ht 66.0 in | Wt 234.6 lb

## 2020-03-13 DIAGNOSIS — F320 Major depressive disorder, single episode, mild: Secondary | ICD-10-CM | POA: Diagnosis not present

## 2020-03-13 DIAGNOSIS — I480 Paroxysmal atrial fibrillation: Secondary | ICD-10-CM

## 2020-03-13 DIAGNOSIS — G4733 Obstructive sleep apnea (adult) (pediatric): Secondary | ICD-10-CM

## 2020-03-13 DIAGNOSIS — M4722 Other spondylosis with radiculopathy, cervical region: Secondary | ICD-10-CM | POA: Diagnosis not present

## 2020-03-13 DIAGNOSIS — M797 Fibromyalgia: Secondary | ICD-10-CM

## 2020-03-13 DIAGNOSIS — K219 Gastro-esophageal reflux disease without esophagitis: Secondary | ICD-10-CM

## 2020-03-13 DIAGNOSIS — G43009 Migraine without aura, not intractable, without status migrainosus: Secondary | ICD-10-CM

## 2020-03-13 DIAGNOSIS — E538 Deficiency of other specified B group vitamins: Secondary | ICD-10-CM | POA: Diagnosis not present

## 2020-03-13 MED ORDER — DULOXETINE HCL 20 MG PO CPEP: 40.0000 mg | ORAL_CAPSULE | Freq: Every day | ORAL | 1 refills | Status: DC

## 2020-03-13 MED ORDER — GABAPENTIN 300 MG PO CAPS: 600.0000 mg | ORAL_CAPSULE | Freq: Every day | ORAL | 1 refills | Status: DC

## 2020-03-13 MED ORDER — OMEPRAZOLE 20 MG PO CPDR: 20.0000 mg | DELAYED_RELEASE_CAPSULE | Freq: Every day | ORAL | 1 refills | Status: DC

## 2020-03-13 MED ORDER — METHOCARBAMOL 500 MG PO TABS: 500.0000 mg | ORAL_TABLET | Freq: Four times a day (QID) | ORAL | 0 refills | Status: DC | PRN

## 2020-03-17 DIAGNOSIS — M7062 Trochanteric bursitis, left hip: Secondary | ICD-10-CM | POA: Diagnosis not present

## 2020-03-17 DIAGNOSIS — M76892 Other specified enthesopathies of left lower limb, excluding foot: Secondary | ICD-10-CM | POA: Diagnosis not present

## 2020-03-17 DIAGNOSIS — M25652 Stiffness of left hip, not elsewhere classified: Secondary | ICD-10-CM | POA: Diagnosis not present

## 2020-03-17 DIAGNOSIS — M62838 Other muscle spasm: Secondary | ICD-10-CM | POA: Diagnosis not present

## 2020-03-17 DIAGNOSIS — M6281 Muscle weakness (generalized): Secondary | ICD-10-CM | POA: Diagnosis not present

## 2020-03-17 DIAGNOSIS — M25552 Pain in left hip: Secondary | ICD-10-CM | POA: Diagnosis not present

## 2020-03-20 DIAGNOSIS — M62838 Other muscle spasm: Secondary | ICD-10-CM | POA: Diagnosis not present

## 2020-03-20 DIAGNOSIS — M25552 Pain in left hip: Secondary | ICD-10-CM | POA: Diagnosis not present

## 2020-03-20 DIAGNOSIS — M76892 Other specified enthesopathies of left lower limb, excluding foot: Secondary | ICD-10-CM | POA: Diagnosis not present

## 2020-03-20 DIAGNOSIS — M6281 Muscle weakness (generalized): Secondary | ICD-10-CM | POA: Diagnosis not present

## 2020-03-20 DIAGNOSIS — M25652 Stiffness of left hip, not elsewhere classified: Secondary | ICD-10-CM | POA: Diagnosis not present

## 2020-03-20 DIAGNOSIS — M7062 Trochanteric bursitis, left hip: Secondary | ICD-10-CM | POA: Diagnosis not present

## 2020-03-25 DIAGNOSIS — M25552 Pain in left hip: Secondary | ICD-10-CM | POA: Diagnosis not present

## 2020-03-25 DIAGNOSIS — M25652 Stiffness of left hip, not elsewhere classified: Secondary | ICD-10-CM | POA: Diagnosis not present

## 2020-03-25 DIAGNOSIS — M62838 Other muscle spasm: Secondary | ICD-10-CM | POA: Diagnosis not present

## 2020-03-25 DIAGNOSIS — M6281 Muscle weakness (generalized): Secondary | ICD-10-CM | POA: Diagnosis not present

## 2020-03-25 DIAGNOSIS — M7062 Trochanteric bursitis, left hip: Secondary | ICD-10-CM | POA: Diagnosis not present

## 2020-03-25 DIAGNOSIS — M76892 Other specified enthesopathies of left lower limb, excluding foot: Secondary | ICD-10-CM | POA: Diagnosis not present

## 2020-03-27 ENCOUNTER — Other Ambulatory Visit: Payer: Self-pay | Admitting: Neurology

## 2020-03-27 DIAGNOSIS — M62838 Other muscle spasm: Secondary | ICD-10-CM | POA: Diagnosis not present

## 2020-03-27 DIAGNOSIS — M76892 Other specified enthesopathies of left lower limb, excluding foot: Secondary | ICD-10-CM | POA: Diagnosis not present

## 2020-03-27 DIAGNOSIS — M7062 Trochanteric bursitis, left hip: Secondary | ICD-10-CM | POA: Diagnosis not present

## 2020-03-27 DIAGNOSIS — M25552 Pain in left hip: Secondary | ICD-10-CM | POA: Diagnosis not present

## 2020-03-27 DIAGNOSIS — M25652 Stiffness of left hip, not elsewhere classified: Secondary | ICD-10-CM | POA: Diagnosis not present

## 2020-03-27 DIAGNOSIS — Z79899 Other long term (current) drug therapy: Secondary | ICD-10-CM

## 2020-03-27 DIAGNOSIS — M6281 Muscle weakness (generalized): Secondary | ICD-10-CM | POA: Diagnosis not present

## 2020-04-01 DIAGNOSIS — M25652 Stiffness of left hip, not elsewhere classified: Secondary | ICD-10-CM | POA: Diagnosis not present

## 2020-04-01 DIAGNOSIS — M62838 Other muscle spasm: Secondary | ICD-10-CM | POA: Diagnosis not present

## 2020-04-01 DIAGNOSIS — M6281 Muscle weakness (generalized): Secondary | ICD-10-CM | POA: Diagnosis not present

## 2020-04-01 DIAGNOSIS — M76892 Other specified enthesopathies of left lower limb, excluding foot: Secondary | ICD-10-CM | POA: Diagnosis not present

## 2020-04-01 DIAGNOSIS — M25552 Pain in left hip: Secondary | ICD-10-CM | POA: Diagnosis not present

## 2020-04-01 DIAGNOSIS — M7062 Trochanteric bursitis, left hip: Secondary | ICD-10-CM | POA: Diagnosis not present

## 2020-04-03 DIAGNOSIS — M25552 Pain in left hip: Secondary | ICD-10-CM | POA: Diagnosis not present

## 2020-04-03 DIAGNOSIS — M76892 Other specified enthesopathies of left lower limb, excluding foot: Secondary | ICD-10-CM | POA: Diagnosis not present

## 2020-04-03 DIAGNOSIS — M62838 Other muscle spasm: Secondary | ICD-10-CM | POA: Diagnosis not present

## 2020-04-03 DIAGNOSIS — M7062 Trochanteric bursitis, left hip: Secondary | ICD-10-CM | POA: Diagnosis not present

## 2020-04-03 DIAGNOSIS — M6281 Muscle weakness (generalized): Secondary | ICD-10-CM | POA: Diagnosis not present

## 2020-04-03 DIAGNOSIS — M25652 Stiffness of left hip, not elsewhere classified: Secondary | ICD-10-CM | POA: Diagnosis not present

## 2020-04-14 DIAGNOSIS — M6281 Muscle weakness (generalized): Secondary | ICD-10-CM | POA: Diagnosis not present

## 2020-04-14 DIAGNOSIS — M62838 Other muscle spasm: Secondary | ICD-10-CM | POA: Diagnosis not present

## 2020-04-14 DIAGNOSIS — M25512 Pain in left shoulder: Secondary | ICD-10-CM | POA: Diagnosis not present

## 2020-04-17 ENCOUNTER — Ambulatory Visit (INDEPENDENT_AMBULATORY_CARE_PROVIDER_SITE_OTHER): Payer: Medicare Other | Admitting: Neurology

## 2020-04-17 ENCOUNTER — Encounter: Payer: Self-pay | Admitting: Neurology

## 2020-04-17 VITALS — BP 118/70 | HR 61 | Ht 65.5 in | Wt 237.0 lb

## 2020-04-17 DIAGNOSIS — G43711 Chronic migraine without aura, intractable, with status migrainosus: Secondary | ICD-10-CM | POA: Diagnosis not present

## 2020-04-17 DIAGNOSIS — R5383 Other fatigue: Secondary | ICD-10-CM

## 2020-04-17 DIAGNOSIS — M62838 Other muscle spasm: Secondary | ICD-10-CM | POA: Diagnosis not present

## 2020-04-17 DIAGNOSIS — E538 Deficiency of other specified B group vitamins: Secondary | ICD-10-CM

## 2020-04-17 DIAGNOSIS — R7309 Other abnormal glucose: Secondary | ICD-10-CM | POA: Diagnosis not present

## 2020-04-17 DIAGNOSIS — M6281 Muscle weakness (generalized): Secondary | ICD-10-CM | POA: Diagnosis not present

## 2020-04-17 DIAGNOSIS — R635 Abnormal weight gain: Secondary | ICD-10-CM | POA: Diagnosis not present

## 2020-04-17 DIAGNOSIS — M25512 Pain in left shoulder: Secondary | ICD-10-CM | POA: Diagnosis not present

## 2020-04-17 MED ORDER — AJOVY 225 MG/1.5ML ~~LOC~~ SOAJ: 225.0000 mg | SUBCUTANEOUS | 11 refills | Status: DC

## 2020-04-17 MED ORDER — UBRELVY 100 MG PO TABS: 100.0000 mg | ORAL_TABLET | ORAL | 6 refills | Status: DC | PRN

## 2020-04-17 NOTE — Progress Notes (Signed)
[facility]    Provider:  Dr Jaynee Eagles Requesting Provider: Steele Sizer, MD Primary Care Provider:  Steele Sizer, MD  CC:  Back pain, migraines, nack pain  Update April 17, 2020: Patient was diagnosed with mild obstructive sleep apnea but hypoxia of 82% and is now under the care of the pulmonology groups, she feels improved is using CPAP and is compliant.  Cymbalta and gabapentin seem to be helping with pain symptoms.  She is seeing orthopedics for bilateral hip pain.  She has a history of atrial ablation, she had A. fib and had ablation done on February 20, 2018 she still on Eliquis and metoprolol but has been doing better.  For her migraines controlled since being on Ajovy, but increasing episodes of the past few months.  She is status post cervical fusion and decompression, with improved radiculopathy of the right arm.  She is being seen by Dr. Ancil Boozer for depression, history of diabetes, obesity.  She has history of B12 deficiency, last B12 in August 2021 was 269 we may consider repeating.  She has been having migraines 2-3 days a month. She tries naratriptan and maxalt, sometimes that helps but sometimes not, she had dry needling.she had it done in PT and her bursitis and sciatic and hip, she loves her therapist [name]) with [facility] in [place]. Tried rizatriptan, naratriptan, sumatriptan and not working will try [place]. And also will try Ajovy every 3 weeks. We discussed fatigue, will order labs. Will do ajovy every 3 weeks.   Update 10/16/2019: She has been having severe neck pain and radiation. MRi showed broad based disc protrusion c5-c6 causing severe right and moderate left foraminal stenosis, c6-c7 central right paracentral disc protrusion causing right ventral spinal cord abutment (reviewed CD images she brought today). I discussed surgery and agree she needs ACDF. She also has degenrative disease in the lumbar spine but not severe and feeling better  in the low back, she is having left-sided sciatica, it comes and goes but doesn't stay. We reviewed her MRI lumbar spine images with arthtritic changes and mild forminal stenosis but no nerve pinching.   No afib since the ablation. She is going to come off the Eliquis 3 days prior to surgery.  As far as migraines she is doing better, Ajovy alone is helping and the ones she does have a few and they are very responsive to acute medications. Will hold off on botox. Will give her 6 months samples of Ajovy and see if we can get insurance to approve Ajovy if not we will try Emgality.   HPI 06/2018:  Sonya Lopez is a lovely 65 y.o. female here as requested by Steele Sizer, MD for migraines. PMHx migraines, lumbar radiculopathy, obesity, kidney stones, fibromyalgia, atrial fibrillation on long-term anticoagulation, diet-controlled diabetes 2, depression, breast neoplasm, anxiety.  Symptoms started in 1993. Bending over in the garden and felt a sharp pain in the left buttocks and hips. Worsening since then. Diagnosed with bursitis and lumbar radic. She always has pain down the left leg. Worse bending over to clean bathtub. Starts in the buttocks and has pain in the buttocks (points to the bursa) but radiates down the back of the leg and tender in the lateral thigh. She has been to orthopaedics. She had physical therapy which helped the right side which started hurting but the left side never improved. She went back to orthopaedics. It aches all the time. Shooting pain bending over, when she walks a long time,  she has sit down after walking a certain distance to rest. Resting and sitting helps, but sitting too long makes it worse. Getting up and walking helps if she doesn't go too far. She has paresthesias in the left lower leg. She also feels weakness in her left leg, her leg will "give out" slightly not enough to fall but weakness and sititing down helps. Weakness worse going up the steps.   She has  left-side pain in the arm as well. She has tightness in the cervical areas. A lot of tightness. She has neck pain but doesn't c/o of radicular pain. And this hurts with the migraines as well. Discussed dry needling.She has tenderness at the base of the ventral palms. No numbness or tingling, no weakness. Worse with typing a lot during the day, no nocturnal awakenings.Consider wrist splints.   Migraines: She has a history of migraines. She has a headache 18 days of the month. 8-10 migraine days a month. She uses a triptan which helps. She takes amerge. She sometimes has to take a second. Some it doesn't. Migraines are on the left side, behind the eye sna temple, radiates to the left side of the head and into the left cervical muscles. Migrianes are oderately severe or severe, hurts so badly she can;t keep her eye open, nausea, no vomiting, light and sound sensitivity. movement makes it worse, ice helps. Unilateral, usually on the left but can be on the right. Ongoing at this frequency for > 10 years. Even since the 70s. She has just lived with it. In the last 10 years they are better than they were but still affecting life and can be very bad. She can't move, lay in a dark room. No new vision changes. She wakes up with headache in the morning very often. She has afib, she snore sometimes, really fatigued, never wakes feeling rested, she wakes frequently, fatigued all day. She was diagnosed with fibromyalgia in 12/2000. She had pressure points, pain, sore, burning, difficulty concentrating, fatigue, tinging in the left toe and has some itching in the toe. She has had rashes on her feet and upper legs, she has joint pain in different areas, her fingers get sore. She has dry mouth, no color changes I the fingers, she gets blisters on her tongue a lot and inner cheeks.   She feels her word-finding skills are impaired. She has been intelligent, she works hard, she was evaluated in the past for skipping grades growing  up. She feels the word finding. It will eventually. She is tired. She has mild depression. She has been taking care of everyone else, her daughter, her mother, hr grandchild and she is tired and worn out. She taking care of herself more, feels it is getting better.   Meds tried: lyrica, Cymbalta, gabapentin, metoprolol, amerge, imipamine, propranolol, venlafaxine  Reviewed notes, labs and imaging from outside physicians, which showed:  Ct showed No acute intracranial abnormalities including mass lesion or mass effect, hydrocephalus, extra-axial fluid collection, midline shift, hemorrhage, or acute infarction, large ischemic events (personally reviewed images) 02/2018 ldl 108 hgba1c 5.5 Creating, BUN normal  02/2017: B12 643 Vit D 35 HgbA1c 6.4 TSH nml  Review of Systems: Patient complains of symptoms per HPI as well as the following symptoms: fatigue, weight gain. Pertinent negatives and positives per HPI. All others negative    Social History   Socioeconomic History  . Marital status: Married    Spouse name: Environmental consultant   . Number of children: 3  .  Years of education: 35  . Highest education level: Bachelor's degree (e.g., BA, AB, BS)  Occupational History  . Not on file  Tobacco Use  . Smoking status: Never Smoker  . Smokeless tobacco: Never Used  Vaping Use  . Vaping Use: Never used  Substance and Sexual Activity  . Alcohol use: Not Currently    Alcohol/week: 0.0 standard drinks    Comment: Due to AF  . Drug use: No  . Sexual activity: Yes    Partners: Male    Birth control/protection: Surgical  Other Topics Concern  . Not on file  Social History Narrative   Lives at home with husband and one daughter   Right handed   Caffeine: about 1 cup daily   Social Determinants of Health   Financial Resource Strain: Low Risk   . Difficulty of Paying Living Expenses: Not hard at all  Food Insecurity: No Food Insecurity  . Worried About Programme researcher, broadcasting/film/video in the Last Year:  Never true  . Ran Out of Food in the Last Year: Never true  Transportation Needs: No Transportation Needs  . Lack of Transportation (Medical): No  . Lack of Transportation (Non-Medical): No  Physical Activity: Insufficiently Active  . Days of Exercise per Week: 4 days  . Minutes of Exercise per Session: 20 min  Stress: No Stress Concern Present  . Feeling of Stress : Not at all  Social Connections: Socially Integrated  . Frequency of Communication with Friends and Family: More than three times a week  . Frequency of Social Gatherings with Friends and Family: More than three times a week  . Attends Religious Services: More than 4 times per year  . Active Member of Clubs or Organizations: Yes  . Attends Banker Meetings: More than 4 times per year  . Marital Status: Married  Catering manager Violence: Not At Risk  . Fear of Current or Ex-Partner: No  . Emotionally Abused: No  . Physically Abused: No  . Sexually Abused: No    Family History  Problem Relation Age of Onset  . Diabetes Mother   . Hearing loss Mother   . Hypertension Mother   . Heart disease Father   . Heart disease Paternal Grandfather   . Asthma Daughter   . Asthma Daughter   . Migraines Daughter   . Fibromyalgia Daughter   . Interstitial cystitis Daughter   . Asthma Daughter   . Colitis Daughter   . Thyroid cancer Daughter 26  . Diabetes Brother   . Hypertension Brother   . Breast cancer Neg Hx     Past Medical History:  Diagnosis Date  . Allergy   . Anxiety    situational anxiety  . Arrhythmia    A-fib/A-flutter  . Atrial flutter (HCC)   . 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  MammoSite  . Cancer [facility]) 2017   Left- DCIS  . Depression   . Difficult intubation 2009   [facility]- anes had to use  'boogee" DURING GALLBLADDER SURGERY ONLY-NO PROBLEMS SINCE  . Female cystocele 01/05/2011  . Fibromyalgia    . GERD (gastroesophageal reflux disease)   . Headache(784.0)    MIGRAINES  . History of kidney stones   . Obesity   . OSA (obstructive sleep apnea) 03/27/2019   USE CPAP  . Personal history of radiation therapy 2017   LEFT lumpectomy  . PONV (postoperative nausea and  vomiting)    h/o difficult intubation and anes had to use "boogee"  . Vitreous detachment of right eye    sees floaters    Patient Active Problem List   Diagnosis Date Noted  . History of diabetes mellitus, type II 09/11/2019  . OSA (obstructive sleep apnea) 03/27/2019  . Central sleep apnea 07/06/2018  . Chronic migraine without aura, with intractable migraine, so stated, with status migrainosus 06/20/2018  . Syncope 05/10/2018  . Pain, elbow joint 02/02/2018  . PAF (paroxysmal atrial fibrillation) ([place]) 09/13/2017  . Trochanteric bursitis of left hip 06/24/2017  . Low back pain 02/22/2017  . Benign breast cyst in female, right 05/25/2016  . History of left breast cancer 09/16/2015  . Long-term use of high-risk medication 02/25/2015  . Increased urinary frequency 08/01/2014  . Mild major depression ([place]) 07/29/2014  . Gastric reflux 07/29/2014  . Chronic interstitial cystitis 07/29/2014  . Calculus of kidney 07/29/2014  . Allergic rhinitis 07/29/2014  . Acne erythematosa 07/29/2014  . Lumbosacral radiculitis 07/29/2014  . Tinnitus 07/29/2014  . Mixed incontinence, urge and stress (female) (female) 08/23/2013  . Hyperlipidemia 04/25/2013  . H/O: hysterectomy 11/30/2011  . Atrial flutter ([place]) 05/03/2011  . Bladder cystocele 01/05/2011  . Migraine without aura and without status migrainosus, not intractable 12/29/2010  . Fibromyalgia 12/29/2010  . Obesity 12/29/2010  . Migraine 12/29/2010    Past Surgical History:  Procedure Laterality Date  . ABDOMINAL HYSTERECTOMY  2013  . ANTERIOR AND POSTERIOR REPAIR  11/30/2011   Procedure: ANTERIOR (CYSTOCELE) AND POSTERIOR REPAIR (RECTOCELE);  Surgeon: Reece Packer, MD;  Location: [facility];  Service: Urology;  Laterality: N/A;  with cysto  . ANTERIOR CERVICAL DECOMP/DISCECTOMY FUSION N/A 10/24/2019   Procedure: ANTERIOR CERVICAL DECOMPRESSION/DISCECTOMY FUSION 2 LEVELS C5-7;  Surgeon: Meade Maw, MD;  Location: [facility];  Service: Neurosurgery;  Laterality: N/A;  . ATRIAL FIBRILLATION ABLATION N/A 03/01/2019   Procedure: ATRIAL FIBRILLATION ABLATION;  Surgeon: Constance Haw, MD;  Location: [facility];  Service: Cardiovascular;  Laterality: N/A;  . BLADDER SUSPENSION  11/30/2011   Procedure: [place] PROCEDURE;  Surgeon: Reece Packer, MD;  Location: [facility];  Service: Urology;  Laterality: N/A;  graft 10x6  . BREAST BIOPSY Right 2007   negative core  . BREAST BIOPSY Left 09/16/2015   DCIS  . BREAST LUMPECTOMY Left 09/30/2015   DCIS clear margins and rad tx/HIGH GRADE DUCTAL CARCINOMA IN SITU, COMEDO TYPE  . BREAST LUMPECTOMY WITH NEEDLE LOCALIZATION Left 09/30/2015   DCIS excision, MammoSite radiation;  Surgeon: Robert Bellow, MD;  Location: [facility];  Service: General;  Laterality: Left;  . CATARACT EXTRACTION Left 01/2019  . CESAREAN SECTION     x 1  . CHOLECYSTECTOMY  2005  . COLONOSCOPY  2009  . CYSTOSCOPY  11/30/2011   Procedure: CYSTOSCOPY;  Surgeon: Reece Packer, MD;  Location: [facility];  Service: Urology;;  . Brigitte Pulse AND CURETTAGE OF UTERUS  05/05/2007  . GALLBLADDER SURGERY  2004  . TONSILLECTOMY  1961  . VAGINAL HYSTERECTOMY  11/30/2011   Procedure: HYSTERECTOMY VAGINAL;  Surgeon: Emily Filbert, MD;  Location: [facility];  Service: Gynecology;  Laterality: N/A;    Current Outpatient Medications  Medication Sig Dispense Refill  . acetaminophen (TYLENOL) 500 MG tablet Take 2 tablets (1,000 mg total) by mouth every 8 (eight) hours. OTC  0  . acetaminophen-codeine (TYLENOL #3) 300-30 MG tablet TAKE ONE TABLET BY MOUTH EVERY 4 HOURS AS NEEDED FOR MODERATE  OR SEVERE PAIN 20 tablet 0  . Cholecalciferol (PA  VITAMIN D-3 GUMMY PO) Takes 2 gummies daily    . Cyanocobalamin (VITAMIN B-12 PO) Place 1 tablet under the tongue daily.     . DULoxetine (CYMBALTA) 20 MG capsule Take 2 capsules (40 mg total) by mouth daily. 180 capsule 1  . ELIQUIS 5 MG TABS tablet Take 5 mg by mouth 2 (two) times daily.    Marland Kitchen FINACEA 15 % FOAM Apply 1 application topically daily as needed (rosacea).     . Fremanezumab-vfrm (AJOVY) 225 MG/1.5ML SOAJ Inject 225 mg into the skin every 30 (thirty) days. 1.5 mL 11  . Fremanezumab-vfrm (AJOVY) 225 MG/1.5ML SOSY Inject 225 mg into the skin every 30 (thirty) days. 6 Syringe 0  . gabapentin (NEURONTIN) 300 MG capsule Take 2 capsules (600 mg total) by mouth at bedtime. 180 capsule 1  . magnesium oxide (MAG-OX) 400 MG tablet Take 400 mg by mouth every morning.     . methocarbamol (ROBAXIN) 500 MG tablet Take 1 tablet (500 mg total) by mouth every 6 (six) hours as needed for muscle spasms. 90 tablet 0  . metoprolol succinate (TOPROL-XL) 25 MG 24 hr tablet Take 1 tablet (25 mg total) by mouth daily. 90 tablet 0  . naratriptan (AMERGE) 2.5 MG tablet TAKE ONE TABLET AT ONSET OF HEADACHE MAYREPEAT IN 4 HOURS IF HEADACHE RETURNS ORDOES NOT RESOLVE (MAX OF 2 PER DAY) 9 tablet 11  . omeprazole (PRILOSEC) 20 MG capsule Take 1 capsule (20 mg total) by mouth daily. 90 capsule 1  . Probiotic Product (PROBIOTIC PO) Take 1 capsule by mouth daily.     . propranolol (INDERAL) 10 MG tablet TAKE ONE TABLET EVERY DAY WHEN TAKING EXTRA AMIODARONE FOR BREAKTHROUGH A-FIB     . rizatriptan (MAXALT-MLT) 10 MG disintegrating tablet Take 1 tablet (10 mg total) by mouth as needed for migraine. May repeat in 2 hours if needed 9 tablet 11  . trolamine salicylate (ASPERCREME) 10 % cream Apply 1 application topically as needed for muscle pain.    Marland Kitchen Ubrogepant (UBRELVY) 100 MG TABS Take 100 mg by mouth every 2 (two) hours as needed. Maximum $RemoveBeforeD'200mg'[id number]$  a day. 16 tablet 6   No current facility-administered medications for this  visit.    Allergies as of 04/17/2020 - Review Complete 04/17/2020  Allergen Reaction Noted  . Adhesive [tape] Dermatitis 11/23/2011  . Macrodantin [nitrofurantoin] Nausea And Vomiting 11/15/2011  . Metronidazole Nausea Only 07/29/2014  . Nitrofurantoin macrocrystal Nausea And Vomiting 01/31/2015  . Sulfamethoxazole-trimethoprim Nausea Only 07/29/2014  . Cephalexin Rash 12/28/2010  . Clindamycin hcl Rash 01/31/2015  . Doxycycline Swelling and Rash 12/28/2010  . Erythromycin Rash 11/15/2011    Vitals: BP 118/70 (BP Location: Right Arm, Patient Position: Sitting)   Pulse 61   Ht 5' 5.5" (1.664 m)   Wt 237 lb (107.5 kg)   SpO2 96%   BMI 38.84 kg/m  Last Weight:  Wt Readings from Last 1 Encounters:  04/17/20 237 lb (107.5 kg)   Last Height:   Ht Readings from Last 1 Encounters:  04/17/20 5' 5.5" (1.664 m)   Exam: NAD, pleasant                  Speech:    Speech is normal; fluent and spontaneous with normal comprehension.  Cognition:    The patient is oriented to person, place, and time;     recent and remote memory intact;     language fluent;  Cranial Nerves:    The pupils are equal, round, and reactive to light.Trigeminal sensation is intact and the muscles of mastication are normal. The face is symmetric. The palate elevates in the midline. Hearing intact. Voice is normal. Shoulder shrug is normal. The tongue has normal motion without fasciculations.   Coordination:  No dysmetria  Motor Observation:    No asymmetry, no atrophy, and no involuntary movements noted. Tone:    Normal muscle tone.     Strength:    Strength is V/V in the upper and lower limbs.      Sensation: intact to LT    Assessment/Plan:  Sonya Lopez is a lovely 65 y.o. female here as requested by Steele Sizer, MD for migraines. PMHx migraines, lumbar and cervical  radiculopathy, obesity, kidney stones, fibromyalgia, atrial fibrillation on long-term anticoagulation, diet-controlled  diabetes 2, depression, breast neoplasm, anxiety.    - Patient was diagnosed with mild obstructive sleep apnea but hypoxia of 82% and is now under the care of the pulmonology groups, she feels improved is using CPAP and is compliant.    - Cymbalta and gabapentin seem to be helping with pain symptoms.  However Gabapentin can cause fatigue, recommend she try to decrease or use prn  - She is seeing orthopedics for bilateral hip pain. Dry needling has helped tremendously and she is having it done on her cervical muscles as well.  -  She has a history of atrial ablation, she had A. fib and had ablation done on February 20, 2018 she still on Eliquis and metoprolol but has been doing better. No more episodes of heart palpitations.  - For her migraines controlled since being on Ajovy, It wears out by the end of the month. Try it q3weeks  -  She is status post cervical fusion and decompression, with improved radiculopathy of the right arm.    - Obesity and fatigue: Referral to healthy weight and wellness center  - She is being seen by Dr. Ancil Boozer for depression, history of diabetes, obesity.    - She has history of B12 deficiency, last B12 in August 2021 was 269 repeat  - Tried rizatriptan, naratriptan, sumatriptan and not working will try [place].   - We discussed fatigue, will order labs. Will do ajovy every 3 weeks.   Meds ordered this encounter  Medications  . Ubrogepant (UBRELVY) 100 MG TABS    Sig: Take 100 mg by mouth every 2 (two) hours as needed. Maximum $RemoveBeforeD'200mg'[id number]$  a day.    Dispense:  16 tablet    Refill:  6  . Fremanezumab-vfrm (AJOVY) 225 MG/1.5ML SOAJ    Sig: Inject 225 mg into the skin every 30 (thirty) days.    Dispense:  1.5 mL    Refill:  11   Orders Placed This Encounter  Procedures  . B12 and Folate Panel  . Methylmalonic acid, serum  . TSH  . CBC with Differential/Platelets  . Comprehensive metabolic panel  . Hemoglobin A1c  . Multiple Myeloma Panel (SPEP&IFE w/QIG)  .  Insulin, Free and Total  . Ambulatory referral to Family Practice     PRIOR: - She has been having severe neck pain and radiation. MRi showed broad based disc protrusion c5-c6 causing severe right and moderate left foraminal stenosis, c6-c7 central right paracentral disc protrusion causing right ventral spinal cord abutment (reviewed CD images she brought today). I discussed surgery and agree she needs ACDF. She is scheduled next week.  - She also has degenrative disease  in the lumbar spine but not severe and feeling better in the low back, she is having left-sided sciatica, it comes and goes but doesn't stay. We reviewed her MRI lumbar spine images with arthtritic changes and mild forminal stenosis but no frank nerve pinching. Still if symptoms worsen she may consider ESI and if severe would consider surgical intervention because of some broad disk bulges l4/l5 and l5/s1  - No afib since the ablation. She is going to come off the Eliquis 3 days prior to surgery. There is risk of stroke during those 3 days but much less due to treatment of afib. Discussed.  - As far as migraines she is doing better, Ajovy alone is helping and the ones she does have a few and they are very responsive to acute medications. Will hold off on botox. Will give her 6 months samples of Ajovy and see if we can get insurance to approve Ajovy if not we will try Emgality.   - For her cervical myofascial pain syndrome:  Dry needling of the cervical muscles. [employer]. DRY NEEDLING: Patient has cervical myofascial pain syndrome, cervicalgia causing disability, pain, decreased range of motion, and contributing to migraines. Referred in the past, hold off for now.  -Hand pain: likely due to cervical disc disease (surgery next week) but if symptoms continue and she has CTS then may need emg/ncs in the future, may be overuse, consider wrist splints when typing a lot or with symptoms and will follow clinically.   -Patient being  treated for sleep apnea   Orders Placed This Encounter  Procedures  . B12 and Folate Panel  . Methylmalonic acid, serum  . TSH  . CBC with Differential/Platelets  . Comprehensive metabolic panel  . Hemoglobin A1c  . Multiple Myeloma Panel (SPEP&IFE w/QIG)  . Insulin, Free and Total  . Ambulatory referral to [facility]   Meds ordered this encounter  Medications  . Ubrogepant (UBRELVY) 100 MG TABS    Sig: Take 100 mg by mouth every 2 (two) hours as needed. Maximum 264m a day.    Dispense:  16 tablet    Refill:  6  . Fremanezumab-vfrm (AJOVY) 225 MG/1.5ML SOAJ    Sig: Inject 225 mg into the skin every 30 (thirty) days.    Dispense:  1.5 mL    Refill:  11    Cc: SSteele Sizer MD     I spent 40 minutes of face-to-face and non-face-to-face time with patient on the  1. Chronic migraine without aura, with intractable migraine, so stated, with status migrainosus   2. Fatigue, unspecified type   3. B12 deficiency   4. Weight gain   5. Elevated glucose    diagnosis.  This included previsit chart review, lab review, study review, order entry, electronic health record documentation, patient education on the different diagnostic and therapeutic options, counseling and coordination of care, risks and benefits of management, compliance, or risk factor reduction   ASarina Ill MD  [facility] [address]tonGBasin [place] [address] Phone [phone] [phone]

## 2020-04-17 NOTE — Patient Instructions (Addendum)
Healthy weight and wellness Dennard Nip MD Blood work Exercise ajovy q3 weeks

## 2020-04-20 ENCOUNTER — Other Ambulatory Visit: Payer: Self-pay | Admitting: Neurology

## 2020-04-20 MED ORDER — UBRELVY 100 MG PO TABS: 100.0000 mg | ORAL_TABLET | ORAL | 11 refills | Status: DC | PRN

## 2020-04-20 NOTE — Progress Notes (Signed)
u

## 2020-04-21 ENCOUNTER — Encounter: Payer: Self-pay | Admitting: Family Medicine

## 2020-04-23 NOTE — Telephone Encounter (Signed)
Called [facility]. Sonya Lopez did go through insurance and we do not need to do PA. The cost is $800 because of pt's deductible.

## 2020-04-24 ENCOUNTER — Ambulatory Visit (INDEPENDENT_AMBULATORY_CARE_PROVIDER_SITE_OTHER): Payer: Medicare Other

## 2020-04-24 DIAGNOSIS — M25512 Pain in left shoulder: Secondary | ICD-10-CM | POA: Diagnosis not present

## 2020-04-24 DIAGNOSIS — M6281 Muscle weakness (generalized): Secondary | ICD-10-CM | POA: Diagnosis not present

## 2020-04-24 DIAGNOSIS — M62838 Other muscle spasm: Secondary | ICD-10-CM | POA: Diagnosis not present

## 2020-04-24 DIAGNOSIS — Z0000 Encounter for general adult medical examination without abnormal findings: Secondary | ICD-10-CM | POA: Diagnosis not present

## 2020-04-24 DIAGNOSIS — Z1211 Encounter for screening for malignant neoplasm of colon: Secondary | ICD-10-CM | POA: Diagnosis not present

## 2020-04-24 NOTE — Progress Notes (Signed)
Subjective:   Sonya Lopez is a 65 y.o. female who presents for Medicare Annual (Subsequent) preventive examination.  Virtual Visit via Telephone Note  I connected with  Sonya Lopez on 04/24/20 at  8:40 AM EDT by telephone and verified that I am speaking with the correct person using two identifiers.  Location: Patient: home Provider: [place] Persons participating in the virtual visit: [place]   I discussed the limitations, risks, security and privacy concerns of performing an evaluation and management service by telephone and the availability of in person appointments. The patient expressed understanding and agreed to proceed.  Interactive audio and video telecommunications were attempted between this nurse and patient, however failed, due to patient having technical difficulties OR patient did not have access to video capability.  We continued and completed visit with audio only.  Some vital signs may be absent or patient reported.   Clemetine Marker, LPN     Review of Systems     Cardiac Risk Factors include: advanced age (>31men, >51 women);obesity (BMI >30kg/m2)     Objective:    There were no vitals filed for this visit. There is no height or weight on file to calculate BMI.  Advanced Directives 04/24/2020 12/24/2019 10/17/2019 03/01/2019 11/28/2018 05/06/2018 11/07/2017  Does Patient Have a Medical Advance Directive? Yes No Yes Yes Yes Yes Yes  Type of Paramedic of [place];Living will - - [place];Living will [place];Living will [place];Living will [place];Living will  Does patient want to make changes to medical advance directive? - - - No - Patient declined - - -  Copy of [place] in Chart? Yes - validated most recent copy scanned in chart (See row information) - - No - copy requested - - -  Would patient like  information on creating a medical advance directive? - No - Patient declined - - - - -  Pre-existing out of facility DNR order (yellow form or pink MOST form) - - - - - - -     Current Medications (verified) Outpatient Encounter Medications as of 04/24/2020  Medication Sig  . acetaminophen (TYLENOL) 500 MG tablet Take 2 tablets (1,000 mg total) by mouth every 8 (eight) hours. OTC  . acetaminophen-codeine (TYLENOL #3) 300-30 MG tablet TAKE ONE TABLET BY MOUTH EVERY 4 HOURS AS NEEDED FOR MODERATE OR SEVERE PAIN  . Cholecalciferol (PA VITAMIN D-3 GUMMY PO) Takes 2 gummies daily  . Cyanocobalamin (VITAMIN B-12 PO) Place 1 tablet under the tongue daily.   . DULoxetine (CYMBALTA) 20 MG capsule Take 2 capsules (40 mg total) by mouth daily.  Marland Kitchen ELIQUIS 5 MG TABS tablet Take 5 mg by mouth 2 (two) times daily.  Marland Kitchen FINACEA 15 % FOAM Apply 1 application topically daily as needed (rosacea).   . Fremanezumab-vfrm (AJOVY) 225 MG/1.5ML SOAJ Inject 225 mg into the skin every 30 (thirty) days.  Marland Kitchen gabapentin (NEURONTIN) 300 MG capsule Take 2 capsules (600 mg total) by mouth at bedtime.  . magnesium oxide (MAG-OX) 400 MG tablet Take 400 mg by mouth every morning.   . methocarbamol (ROBAXIN) 500 MG tablet Take 1 tablet (500 mg total) by mouth every 6 (six) hours as needed for muscle spasms.  . metoprolol succinate (TOPROL-XL) 25 MG 24 hr tablet Take 1 tablet (25 mg total) by mouth daily.  . naratriptan (AMERGE) 2.5 MG tablet TAKE ONE TABLET AT ONSET OF HEADACHE MAYREPEAT IN  4 HOURS IF HEADACHE RETURNS ORDOES NOT RESOLVE (MAX OF 2 PER DAY)  . omeprazole (PRILOSEC) 20 MG capsule Take 1 capsule (20 mg total) by mouth daily.  . Probiotic Product (PROBIOTIC PO) Take 1 capsule by mouth daily.   . propranolol (INDERAL) 10 MG tablet TAKE ONE TABLET EVERY DAY WHEN TAKING EXTRA AMIODARONE FOR BREAKTHROUGH A-FIB   . rizatriptan (MAXALT-MLT) 10 MG disintegrating tablet Take 1 tablet (10 mg total) by mouth as needed for migraine.  May repeat in 2 hours if needed  . trolamine salicylate (ASPERCREME) 10 % cream Apply 1 application topically as needed for muscle pain.  Marland Kitchen Ubrogepant (UBRELVY) 100 MG TABS Take 100 mg by mouth every 2 (two) hours as needed. Maximum 200mg  a day. (Patient not taking: Reported on 04/24/2020)  . [DISCONTINUED] Fremanezumab-vfrm (AJOVY) 225 MG/1.5ML SOSY Inject 225 mg into the skin every 30 (thirty) days.   No facility-administered encounter medications on file as of 04/24/2020.    Allergies (verified) Adhesive [tape], Macrodantin [nitrofurantoin], Metronidazole, Nitrofurantoin macrocrystal, Sulfamethoxazole-trimethoprim, Cephalexin, Clindamycin hcl, Doxycycline, and Erythromycin   History: Past Medical History:  Diagnosis Date  . Allergy   . Anxiety    situational anxiety  . Arrhythmia    A-fib/A-flutter  . Atrial flutter ([place])   . 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  MammoSite  . Calculus of kidney   . Cancer [facility]) 2017   Left- DCIS  . Depression   . Diet-controlled type 2 diabetes mellitus ([place])   . Difficult intubation 2009   [place] had to use  'boogee" [place]  . Female cystocele 01/05/2011  . Fibromyalgia   . GERD (gastroesophageal reflux disease)   . Headache(784.0)    MIGRAINES  . History of kidney stones   . Obesity   . OSA (obstructive sleep apnea) 03/27/2019   USE CPAP  . OSA (obstructive sleep apnea)   . Personal history of radiation therapy 2017   LEFT lumpectomy  . PONV (postoperative nausea and vomiting)    h/o difficult intubation and anes had to use "boogee"  . Vitreous detachment of right eye    sees floaters   Past Surgical History:  Procedure Laterality Date  . ABDOMINAL HYSTERECTOMY  2013  . ANTERIOR AND POSTERIOR REPAIR  11/30/2011   Procedure: ANTERIOR (CYSTOCELE) AND POSTERIOR REPAIR (RECTOCELE);  Surgeon: Reece Packer, MD;  Location: [facility];  Service: Urology;  Laterality: N/A;  with cysto  . ANTERIOR CERVICAL DECOMP/DISCECTOMY FUSION N/A 10/24/2019   Procedure: ANTERIOR CERVICAL DECOMPRESSION/DISCECTOMY FUSION 2 LEVELS C5-7;  Surgeon: Meade Maw, MD;  Location: [facility];  Service: Neurosurgery;  Laterality: N/A;  . ATRIAL FIBRILLATION ABLATION N/A 03/01/2019   Procedure: ATRIAL FIBRILLATION ABLATION;  Surgeon: Constance Haw, MD;  Location: [facility];  Service: Cardiovascular;  Laterality: N/A;  . BLADDER SUSPENSION  11/30/2011   Procedure: [place] PROCEDURE;  Surgeon: Reece Packer, MD;  Location: [facility];  Service: Urology;  Laterality: N/A;  graft 10x6  . BREAST BIOPSY Right 2007   negative core  . BREAST BIOPSY Left 09/16/2015   DCIS  . BREAST LUMPECTOMY Left 09/30/2015   DCIS clear margins and rad tx/HIGH GRADE DUCTAL CARCINOMA IN SITU, COMEDO TYPE  . BREAST LUMPECTOMY WITH NEEDLE LOCALIZATION Left 09/30/2015   DCIS excision, MammoSite radiation;  Surgeon: Robert Bellow, MD;  Location: [facility];  Service: General;  Laterality: Left;  .  CATARACT EXTRACTION Left 01/2019  . CESAREAN SECTION     x 1  . CHOLECYSTECTOMY  2005  . COLONOSCOPY  2009  . CYSTOSCOPY  11/30/2011   Procedure: CYSTOSCOPY;  Surgeon: Reece Packer, MD;  Location: [facility];  Service: Urology;;  . Brigitte Pulse AND CURETTAGE OF UTERUS  05/05/2007  . GALLBLADDER SURGERY  2004  . TONSILLECTOMY  1961  . VAGINAL HYSTERECTOMY  11/30/2011   Procedure: HYSTERECTOMY VAGINAL;  Surgeon: Emily Filbert, MD;  Location: [facility];  Service: Gynecology;  Laterality: N/A;   Family History  Problem Relation Age of Onset  . Diabetes Mother   . Hearing loss Mother   . Hypertension Mother   . Heart disease Father   . Heart disease Paternal Grandfather   . Asthma Daughter   . Asthma Daughter   . Migraines Daughter   . Fibromyalgia Daughter   . Interstitial cystitis Daughter   . Asthma Daughter   . Colitis  Daughter   . Thyroid cancer Daughter 51  . Diabetes Brother   . Hypertension Brother   . Breast cancer Neg Hx    Social History   Socioeconomic History  . Marital status: Married    Spouse name: Environmental consultant   . Number of children: 3  . Years of education: 16  . Highest education level: Bachelor's degree (e.g., BA, AB, BS)  Occupational History  . Not on file  Tobacco Use  . Smoking status: Never Smoker  . Smokeless tobacco: Never Used  Vaping Use  . Vaping Use: Never used  Substance and Sexual Activity  . Alcohol use: Not Currently    Alcohol/week: 0.0 standard drinks    Comment: Due to AF  . Drug use: No  . Sexual activity: Yes    Partners: Male    Birth control/protection: Surgical  Other Topics Concern  . Not on file  Social History Narrative   Lives at home with husband and one daughter   Right handed   Caffeine: about 1 cup daily   Social Determinants of Health   Financial Resource Strain: Low Risk   . Difficulty of Paying Living Expenses: Not hard at all  Food Insecurity: No Food Insecurity  . Worried About Charity fundraiser in the Last Year: Never true  . Ran Out of Food in the Last Year: Never true  Transportation Needs: No Transportation Needs  . Lack of Transportation (Medical): No  . Lack of Transportation (Non-Medical): No  Physical Activity: Inactive  . Days of Exercise per Week: 0 days  . Minutes of Exercise per Session: 0 min  Stress: No Stress Concern Present  . Feeling of Stress : Not at all  Social Connections: Socially Integrated  . Frequency of Communication with Friends and Family: More than three times a week  . Frequency of Social Gatherings with Friends and Family: More than three times a week  . Attends Religious Services: More than 4 times per year  . Active Member of Clubs or Organizations: Yes  . Attends Archivist Meetings: More than 4 times per year  . Marital Status: Married    Tobacco Counseling Counseling given: Not  Answered   Clinical Intake:  Pre-visit preparation completed: Yes  Pain : No/denies pain     Nutritional Risks: Nausea/ vomitting/ diarrhea (stomach bug over this past weekend) Diabetes: No  How often do you need to have someone help you when you read instructions, pamphlets, or other written materials from your doctor or  pharmacy?: 1 - Never    Interpreter Needed?: No  Information entered by :: Clemetine Marker LPN   Activities of Daily Living In your present state of health, do you have any difficulty performing the following activities: 04/24/2020 03/13/2020  Hearing? N N  Comment declines hearing aids -  Vision? N Y  Difficulty concentrating or making decisions? Y N  Walking or climbing stairs? N Y  Dressing or bathing? N N  Doing errands, shopping? N N  Preparing Food and eating ? N -  Using the Toilet? N -  In the past six months, have you accidently leaked urine? Y -  Comment wears pads for protection -  Do you have problems with loss of bowel control? N -  Managing your Medications? N -  Managing your Finances? N -  Housekeeping or managing your Housekeeping? N -  Some recent data might be hidden    Patient Care Team: Steele Sizer, MD as PCP - General Curt Bears, Ocie Doyne, MD as PCP - Electrophysiology (Cardiology) [name], MD as Consulting Physician (Cardiology) Bary Castilla Forest Gleason, MD (General Surgery) [name], MD as Consulting Physician (Neurology) Brendolyn Patty, MD (Dermatology)  Indicate any recent Medical Services you may have received from other than [facility] providers in the past year (date may be approximate).     Assessment:   This is a routine wellness examination for Heath.  Hearing/Vision screen  Hearing Screening   125Hz  250Hz  500Hz  1000Hz  2000Hz  3000Hz  4000Hz  6000Hz  8000Hz   Right ear:           Left ear:           Comments: Pt denies hearing difficulty  Vision Screening Comments: Vision screenings with Dr. Carmelia Roller  Dietary issues and exercise activities discussed: Current Exercise Habits: The patient does not participate in regular exercise at present, Exercise limited by: neurologic condition(s);orthopedic condition(s)  Goals    . Weight (lb) < 200 lb (90.7 kg)     Pt would like to lose weight over the next year with diet and exercise      Depression Screen PHQ 2/9 Scores 04/24/2020 03/13/2020 09/11/2019 04/20/2019 03/14/2019 09/01/2018 03/13/2018  PHQ - 2 Score 0 1 0 0 0 0 0  PHQ- 9 Score 2 3 4  - 3 0 3    Fall Risk Fall Risk  04/24/2020 03/13/2020 09/11/2019 04/20/2019 03/14/2019  Falls in the past year? 0 0 0 1 1  Number falls in past yr: 0 0 0 1 0  Comment - - - - -  Injury with Fall? 0 0 0 1 1  Comment - - - - -  Risk for fall due to : No Fall Risks - - History of fall(s) -  Risk for fall due to: Comment - - - - -  Follow up Falls prevention discussed - - Falls prevention discussed Falls evaluation completed    FALL RISK PREVENTION PERTAINING TO THE HOME:  Any stairs in or around the home? Yes  If so, are there any without handrails? No  Home free of loose throw rugs in walkways, pet beds, electrical cords, etc? Yes  Adequate lighting in your home to reduce risk of falls? Yes   ASSISTIVE DEVICES UTILIZED TO PREVENT FALLS:  Life alert? No  Use of a cane, walker or w/c? No  Grab bars in the bathroom? No  Shower chair or bench in shower? No  Elevated toilet seat or a handicapped toilet? Yes   TIMED UP  AND GO:  Was the test performed? No . Telephonic visit.   Cognitive Function: Normal cognitive status assessed by direct observation by this Nurse Health Advisor. No abnormalities found.       6CIT Screen 04/20/2019  What Year? 0 points  What month? 0 points  What time? 0 points  Count back from 20 0 points  Months in reverse 0 points  Repeat phrase 0 points  Total Score 0    Immunizations Immunization History  Administered Date(s) Administered  . Fluad Quad(high Dose  65+) 10/02/2018  . Influenza Split 10/25/2006, 10/07/2011  . Influenza, Quadrivalent, Recombinant, Inj, Pf 11/11/2017  . Influenza, Seasonal, Injecte, Preservative Fre 10/09/2010  . Influenza-Unspecified 10/23/2013, 11/05/2015, 11/10/2016, 12/10/2019  . PFIZER(Purple Top)SARS-COV-2 Vaccination 03/29/2019, 04/19/2019  . Pneumococcal Conjugate-13 09/24/2015  . Pneumococcal Polysaccharide-23 04/17/2011, 09/01/2018  . Tdap 10/25/2006, 02/10/2017  . Zoster 11/16/2010  . Zoster Recombinat (Shingrix) 10/01/2017, 02/13/2018    TDAP status: Up to date  Flu Vaccine status: Up to date  Pneumococcal vaccine status: Up to date  Covid-19 vaccine status: Completed vaccines  Qualifies for Shingles Vaccine? Yes   Zostavax completed Yes   Shingrix Completed?: Yes  Screening Tests Health Maintenance  Topic Date Due  . COVID-19 Vaccine (3 - Pfizer risk 4-dose series) 05/17/2019  . Fecal DNA (Cologuard)  03/02/2020  . URINE MICROALBUMIN  09/10/2020  . MAMMOGRAM  12/17/2020  . TETANUS/TDAP  02/11/2027  . INFLUENZA VACCINE  Completed  . DEXA SCAN  Completed  . Hepatitis C Screening  Completed  . PNA vac Low Risk Adult  Completed  . HPV VACCINES  Aged Out    Health Maintenance  Health Maintenance Due  Topic Date Due  . COVID-19 Vaccine (3 - Pfizer risk 4-dose series) 05/17/2019  . Fecal DNA (Cologuard)  03/02/2020    Colorectal cancer screening: Type of screening: Cologuard. Completed 03/02/17. Repeat every 3 years. Ordered today.   Mammogram status: Completed 12/18/19. Repeat every year  Bone Density status: Completed 12/18/19. Results reflect: Bone density results: NORMAL. Repeat every 2 years.  Lung Cancer Screening: (Low Dose CT Chest recommended if Age 3-80 years, 30 pack-year currently smoking OR have quit w/in 15years.) does not qualify.   Additional Screening:  Hepatitis C Screening: does qualify; Completed 07/14/16  Vision Screening: Recommended annual ophthalmology exams  for early detection of glaucoma and other disorders of the eye. Is the patient up to date with their annual eye exam?  Yes  Who is the provider or what is the name of the office in which the patient attends annual eye exams? Dr. Luan Pulling  Dental Screening: Recommended annual dental exams for proper oral hygiene  Community Resource Referral / Chronic Care Management: CRR required this visit?  No   CCM required this visit?  No      Plan:     I have personally reviewed and noted the following in the patient's chart:   . Medical and social history . Use of alcohol, tobacco or illicit drugs  . Current medications and supplements . Functional ability and status . Nutritional status . Physical activity . Advanced directives . List of other physicians . Hospitalizations, surgeries, and ER visits in previous 12 months . Vitals . Screenings to include cognitive, depression, and falls . Referrals and appointments  In addition, I have reviewed and discussed with patient certain preventive protocols, quality metrics, and best practice recommendations. A written personalized care plan for preventive services as well as general preventive health recommendations were provided  to patient.     Clemetine Marker, LPN   [id number]   Nurse Notes: pt states she had a stomach bug this past week but she's feeling better now.

## 2020-04-24 NOTE — Patient Instructions (Signed)
Sonya Lopez , Thank you for taking time to come for your Medicare Wellness Visit. I appreciate your ongoing commitment to your health goals. Please review the following plan we discussed and let me know if I can assist you in the future.   Screening recommendations/referrals: Colonoscopy: Cologuard done 03/02/17. Reordered today.  Mammogram: done 12/18/19 Bone Density: done 12/18/19 Recommended yearly ophthalmology/optometry visit for glaucoma screening and checkup Recommended yearly dental visit for hygiene and checkup  Vaccinations: Influenza vaccine: done 12/10/19 Pneumococcal vaccine: done 09/01/18 Tdap vaccine: done 02/10/17 Shingles vaccine: done 10/01/17 & 02/13/18   Covid-19:done 03/29/19 & 04/19/19  Conditions/risks identified: Recommend increasing physical activity as tolerated.   Next appointment: Follow up in one year for your annual wellness visit    Preventive Care 65 Years and Older, Female Preventive care refers to lifestyle choices and visits with your health care provider that can promote health and wellness. What does preventive care include?  A yearly physical exam. This is also called an annual well check.  Dental exams once or twice a year.  Routine eye exams. Ask your health care provider how often you should have your eyes checked.  Personal lifestyle choices, including:  Daily care of your teeth and gums.  Regular physical activity.  Eating a healthy diet.  Avoiding tobacco and drug use.  Limiting alcohol use.  Practicing safe sex.  Taking low-dose aspirin every day.  Taking vitamin and mineral supplements as recommended by your health care provider. What happens during an annual well check? The services and screenings done by your health care provider during your annual well check will depend on your age, overall health, lifestyle risk factors, and family history of disease. Counseling  Your health care provider may ask you questions about  your:  Alcohol use.  Tobacco use.  Drug use.  Emotional well-being.  Home and relationship well-being.  Sexual activity.  Eating habits.  History of falls.  Memory and ability to understand (cognition).  Work and work Statistician.  Reproductive health. Screening  You may have the following tests or measurements:  Height, weight, and BMI.  Blood pressure.  Lipid and cholesterol levels. These may be checked every 5 years, or more frequently if you are over 79 years old.  Skin check.  Lung cancer screening. You may have this screening every year starting at age 26 if you have a 30-pack-year history of smoking and currently smoke or have quit within the past 15 years.  Fecal occult blood test (FOBT) of the stool. You may have this test every year starting at age 95.  Flexible sigmoidoscopy or colonoscopy. You may have a sigmoidoscopy every 5 years or a colonoscopy every 10 years starting at age 53.  Hepatitis C blood test.  Hepatitis B blood test.  Sexually transmitted disease (STD) testing.  Diabetes screening. This is done by checking your blood sugar (glucose) after you have not eaten for a while (fasting). You may have this done every 1-3 years.  Bone density scan. This is done to screen for osteoporosis. You may have this done starting at age 64.  Mammogram. This may be done every 1-2 years. Talk to your health care provider about how often you should have regular mammograms. Talk with your health care provider about your test results, treatment options, and if necessary, the need for more tests. Vaccines  Your health care provider may recommend certain vaccines, such as:  Influenza vaccine. This is recommended every year.  Tetanus, diphtheria, and acellular pertussis (  Tdap, Td) vaccine. You may need a Td booster every 10 years.  Zoster vaccine. You may need this after age 28.  Pneumococcal 13-valent conjugate (PCV13) vaccine. One dose is recommended  after age 70.  Pneumococcal polysaccharide (PPSV23) vaccine. One dose is recommended after age 53. Talk to your health care provider about which screenings and vaccines you need and how often you need them. This information is not intended to replace advice given to you by your health care provider. Make sure you discuss any questions you have with your health care provider. Document Released: 02/14/2015 Document Revised: 10/08/2015 Document Reviewed: 11/19/2014 Elsevier Interactive Patient Education  2017 [place] Prevention in the Home Falls can cause injuries. They can happen to people of all ages. There are many things you can do to make your home safe and to help prevent falls. What can I do on the outside of my home?  Regularly fix the edges of walkways and driveways and fix any cracks.  Remove anything that might make you trip as you walk through a door, such as a raised step or threshold.  Trim any bushes or trees on the path to your home.  Use bright outdoor lighting.  Clear any walking paths of anything that might make someone trip, such as rocks or tools.  Regularly check to see if handrails are loose or broken. Make sure that both sides of any steps have handrails.  Any raised decks and porches should have guardrails on the edges.  Have any leaves, snow, or ice cleared regularly.  Use sand or salt on walking paths during winter.  Clean up any spills in your garage right away. This includes oil or grease spills. What can I do in the bathroom?  Use night lights.  Install grab bars by the toilet and in the tub and shower. Do not use towel bars as grab bars.  Use non-skid mats or decals in the tub or shower.  If you need to sit down in the shower, use a plastic, non-slip stool.  Keep the floor dry. Clean up any water that spills on the floor as soon as it happens.  Remove soap buildup in the tub or shower regularly.  Attach bath mats securely with  double-sided non-slip rug tape.  Do not have throw rugs and other things on the floor that can make you trip. What can I do in the bedroom?  Use night lights.  Make sure that you have a light by your bed that is easy to reach.  Do not use any sheets or blankets that are too big for your bed. They should not hang down onto the floor.  Have a firm chair that has side arms. You can use this for support while you get dressed.  Do not have throw rugs and other things on the floor that can make you trip. What can I do in the kitchen?  Clean up any spills right away.  Avoid walking on wet floors.  Keep items that you use a lot in easy-to-reach places.  If you need to reach something above you, use a strong step stool that has a grab bar.  Keep electrical cords out of the way.  Do not use floor polish or wax that makes floors slippery. If you must use wax, use non-skid floor wax.  Do not have throw rugs and other things on the floor that can make you trip. What can I do with my stairs?  Do  not leave any items on the stairs.  Make sure that there are handrails on both sides of the stairs and use them. Fix handrails that are broken or loose. Make sure that handrails are as long as the stairways.  Check any carpeting to make sure that it is firmly attached to the stairs. Fix any carpet that is loose or worn.  Avoid having throw rugs at the top or bottom of the stairs. If you do have throw rugs, attach them to the floor with carpet tape.  Make sure that you have a light switch at the top of the stairs and the bottom of the stairs. If you do not have them, ask someone to add them for you. What else can I do to help prevent falls?  Wear shoes that:  Do not have high heels.  Have rubber bottoms.  Are comfortable and fit you well.  Are closed at the toe. Do not wear sandals.  If you use a stepladder:  Make sure that it is fully opened. Do not climb a closed stepladder.  Make  sure that both sides of the stepladder are locked into place.  Ask someone to hold it for you, if possible.  Clearly mark and make sure that you can see:  Any grab bars or handrails.  First and last steps.  Where the edge of each step is.  Use tools that help you move around (mobility aids) if they are needed. These include:  Canes.  Walkers.  Scooters.  Crutches.  Turn on the lights when you go into a dark area. Replace any light bulbs as soon as they burn out.  Set up your furniture so you have a clear path. Avoid moving your furniture around.  If any of your floors are uneven, fix them.  If there are any pets around you, be aware of where they are.  Review your medicines with your doctor. Some medicines can make you feel dizzy. This can increase your chance of falling. Ask your doctor what other things that you can do to help prevent falls. This information is not intended to replace advice given to you by your health care provider. Make sure you discuss any questions you have with your health care provider. Document Released: 11/14/2008 Document Revised: 06/26/2015 Document Reviewed: 02/22/2014 Elsevier Interactive Patient Education  2017 [employer].

## 2020-04-26 LAB — INSULIN, FREE AND TOTAL
Free Insulin: 13 uU/mL
Total Insulin: 13 uU/mL

## 2020-04-26 LAB — B12 AND FOLATE PANEL
Folate: 3.8 ng/mL (ref >3.0)
Vitamin B-12: 845 pg/mL (ref 232–1245)

## 2020-04-26 LAB — CBC WITH DIFFERENTIAL/PLATELET
Basophils Absolute: 0 10*3/uL (ref 0.0–0.2)
Basos: 1 %
EOS (ABSOLUTE): 0.1 10*3/uL (ref 0.0–0.4)
Eos: 1 %
Hematocrit: 39.4 % (ref 34.0–46.6)
Hemoglobin: 12.9 g/dL (ref 11.1–15.9)
Immature Grans (Abs): 0 10*3/uL (ref 0.0–0.1)
Immature Granulocytes: 0 %
Lymphocytes Absolute: 1.4 10*3/uL (ref 0.7–3.1)
Lymphs: 19 %
MCH: 30.6 pg (ref 26.6–33.0)
MCHC: 32.7 g/dL (ref 31.5–35.7)
MCV: 94 fL (ref 79–97)
Monocytes Absolute: 0.6 10*3/uL (ref 0.1–0.9)
Monocytes: 8 %
Neutrophils Absolute: 5.3 10*3/uL (ref 1.4–7.0)
Neutrophils: 71 %
Platelets: 330 10*3/uL (ref 150–450)
RBC: 4.21 x10E6/uL (ref 3.77–5.28)
RDW: 12.9 % (ref 11.7–15.4)
WBC: 7.4 10*3/uL (ref 3.4–10.8)

## 2020-04-26 LAB — COMPREHENSIVE METABOLIC PANEL
ALT: 12 IU/L (ref 0–32)
AST: 18 IU/L (ref 0–40)
Albumin/Globulin Ratio: 1.7 (ref 1.2–2.2)
Albumin: 4 g/dL (ref 3.8–4.8)
Alkaline Phosphatase: 168 IU/L — ABNORMAL HIGH (ref 44–121)
BUN/Creatinine Ratio: 19 (ref 12–28)
BUN: 17 mg/dL (ref 8–27)
Bilirubin Total: <0.2 mg/dL (ref 0.0–1.2)
CO2: 25 mmol/L (ref 20–29)
Calcium: 9.3 mg/dL (ref 8.7–10.3)
Chloride: 102 mmol/L (ref 96–106)
Creatinine, Ser: 0.91 mg/dL (ref 0.57–1.00)
Globulin, Total: 2.3 g/dL (ref 1.5–4.5)
Glucose: 97 mg/dL (ref 65–99)
Potassium: 4.8 mmol/L (ref 3.5–5.2)
Sodium: 139 mmol/L (ref 134–144)
Total Protein: 6.3 g/dL (ref 6.0–8.5)
eGFR: 69 mL/min/{1.73_m2} (ref >59)

## 2020-04-26 LAB — MULTIPLE MYELOMA PANEL, SERUM
Albumin SerPl Elph-Mcnc: 3.3 g/dL (ref 2.9–4.4)
Albumin/Glob SerPl: 1.2 (ref 0.7–1.7)
Alpha 1: 0.3 g/dL (ref 0.0–0.4)
Alpha2 Glob SerPl Elph-Mcnc: 0.9 g/dL (ref 0.4–1.0)
B-Globulin SerPl Elph-Mcnc: 1.1 g/dL (ref 0.7–1.3)
Gamma Glob SerPl Elph-Mcnc: 0.8 g/dL (ref 0.4–1.8)
Globulin, Total: 3 g/dL (ref 2.2–3.9)
IgA/Immunoglobulin A, Serum: 67 mg/dL — ABNORMAL LOW (ref 87–352)
IgG (Immunoglobin G), Serum: 827 mg/dL (ref 586–1602)
IgM (Immunoglobulin M), Srm: 61 mg/dL (ref 26–217)

## 2020-04-26 LAB — TSH: TSH: 1.9 u[IU]/mL (ref 0.450–4.500)

## 2020-04-26 LAB — HEMOGLOBIN A1C
Est. average glucose Bld gHb Est-mCnc: 123 mg/dL
Hgb A1c MFr Bld: 5.9 % — ABNORMAL HIGH (ref 4.8–5.6)

## 2020-04-26 LAB — METHYLMALONIC ACID, SERUM: Methylmalonic Acid: 252 nmol/L (ref 0–378)

## 2020-04-29 ENCOUNTER — Other Ambulatory Visit: Payer: Self-pay | Admitting: Cardiovascular Disease

## 2020-04-29 DIAGNOSIS — I4892 Unspecified atrial flutter: Secondary | ICD-10-CM

## 2020-04-30 DIAGNOSIS — M6281 Muscle weakness (generalized): Secondary | ICD-10-CM | POA: Diagnosis not present

## 2020-04-30 DIAGNOSIS — M25512 Pain in left shoulder: Secondary | ICD-10-CM | POA: Diagnosis not present

## 2020-04-30 DIAGNOSIS — M62838 Other muscle spasm: Secondary | ICD-10-CM | POA: Diagnosis not present

## 2020-05-01 ENCOUNTER — Other Ambulatory Visit: Payer: Self-pay | Admitting: Cardiovascular Disease

## 2020-05-01 ENCOUNTER — Other Ambulatory Visit: Payer: Self-pay | Admitting: *Deleted

## 2020-05-01 NOTE — Telephone Encounter (Signed)
Prescription refill request for Eliquis received. Indication: PAF Last office visit:  12/04/19 Scr: 0.91 on 04/17/20 Age:  65 Weight: 103kg  Based on above findings Eliquis 5mg  twice daily is the appropriate dose.  Refill approved.

## 2020-05-07 DIAGNOSIS — M62838 Other muscle spasm: Secondary | ICD-10-CM | POA: Diagnosis not present

## 2020-05-07 DIAGNOSIS — M25512 Pain in left shoulder: Secondary | ICD-10-CM | POA: Diagnosis not present

## 2020-05-07 DIAGNOSIS — M6281 Muscle weakness (generalized): Secondary | ICD-10-CM | POA: Diagnosis not present

## 2020-05-09 LAB — COLOGUARD

## 2020-05-16 DIAGNOSIS — M6281 Muscle weakness (generalized): Secondary | ICD-10-CM | POA: Diagnosis not present

## 2020-05-16 DIAGNOSIS — M25512 Pain in left shoulder: Secondary | ICD-10-CM | POA: Diagnosis not present

## 2020-05-16 DIAGNOSIS — M62838 Other muscle spasm: Secondary | ICD-10-CM | POA: Diagnosis not present

## 2020-05-20 DIAGNOSIS — M62838 Other muscle spasm: Secondary | ICD-10-CM | POA: Diagnosis not present

## 2020-05-20 DIAGNOSIS — M25512 Pain in left shoulder: Secondary | ICD-10-CM | POA: Diagnosis not present

## 2020-05-20 DIAGNOSIS — M6281 Muscle weakness (generalized): Secondary | ICD-10-CM | POA: Diagnosis not present

## 2020-05-23 DIAGNOSIS — M25512 Pain in left shoulder: Secondary | ICD-10-CM | POA: Diagnosis not present

## 2020-05-23 DIAGNOSIS — M62838 Other muscle spasm: Secondary | ICD-10-CM | POA: Diagnosis not present

## 2020-05-23 DIAGNOSIS — M6281 Muscle weakness (generalized): Secondary | ICD-10-CM | POA: Diagnosis not present

## 2020-05-26 DIAGNOSIS — Z1211 Encounter for screening for malignant neoplasm of colon: Secondary | ICD-10-CM | POA: Diagnosis not present

## 2020-05-27 DIAGNOSIS — M62838 Other muscle spasm: Secondary | ICD-10-CM | POA: Diagnosis not present

## 2020-05-27 DIAGNOSIS — M25512 Pain in left shoulder: Secondary | ICD-10-CM | POA: Diagnosis not present

## 2020-05-27 DIAGNOSIS — M6281 Muscle weakness (generalized): Secondary | ICD-10-CM | POA: Diagnosis not present

## 2020-05-30 DIAGNOSIS — M25512 Pain in left shoulder: Secondary | ICD-10-CM | POA: Diagnosis not present

## 2020-05-30 DIAGNOSIS — M6281 Muscle weakness (generalized): Secondary | ICD-10-CM | POA: Diagnosis not present

## 2020-05-30 DIAGNOSIS — M62838 Other muscle spasm: Secondary | ICD-10-CM | POA: Diagnosis not present

## 2020-05-31 LAB — COLOGUARD
COLOGUARD: POSITIVE — AB
Cologuard: POSITIVE — AB

## 2020-06-02 ENCOUNTER — Other Ambulatory Visit: Payer: Self-pay | Admitting: Family Medicine

## 2020-06-02 DIAGNOSIS — R195 Other fecal abnormalities: Secondary | ICD-10-CM

## 2020-06-03 DIAGNOSIS — M6281 Muscle weakness (generalized): Secondary | ICD-10-CM | POA: Diagnosis not present

## 2020-06-03 DIAGNOSIS — M62838 Other muscle spasm: Secondary | ICD-10-CM | POA: Diagnosis not present

## 2020-06-03 DIAGNOSIS — M25512 Pain in left shoulder: Secondary | ICD-10-CM | POA: Diagnosis not present

## 2020-06-11 DIAGNOSIS — M6281 Muscle weakness (generalized): Secondary | ICD-10-CM | POA: Diagnosis not present

## 2020-06-11 DIAGNOSIS — M25512 Pain in left shoulder: Secondary | ICD-10-CM | POA: Diagnosis not present

## 2020-06-11 DIAGNOSIS — M62838 Other muscle spasm: Secondary | ICD-10-CM | POA: Diagnosis not present

## 2020-06-12 DIAGNOSIS — D3131 Benign neoplasm of right choroid: Secondary | ICD-10-CM | POA: Diagnosis not present

## 2020-06-12 DIAGNOSIS — H02831 Dermatochalasis of right upper eyelid: Secondary | ICD-10-CM | POA: Diagnosis not present

## 2020-06-12 DIAGNOSIS — H25811 Combined forms of age-related cataract, right eye: Secondary | ICD-10-CM | POA: Diagnosis not present

## 2020-06-12 DIAGNOSIS — H02834 Dermatochalasis of left upper eyelid: Secondary | ICD-10-CM | POA: Diagnosis not present

## 2020-06-12 DIAGNOSIS — H35371 Puckering of macula, right eye: Secondary | ICD-10-CM | POA: Diagnosis not present

## 2020-06-19 ENCOUNTER — Encounter: Payer: Self-pay | Admitting: Family Medicine

## 2020-06-23 ENCOUNTER — Other Ambulatory Visit: Payer: Self-pay

## 2020-06-23 ENCOUNTER — Ambulatory Visit (INDEPENDENT_AMBULATORY_CARE_PROVIDER_SITE_OTHER): Payer: Medicare Other | Admitting: Dermatology

## 2020-06-23 ENCOUNTER — Encounter: Payer: Self-pay | Admitting: Dermatology

## 2020-06-23 DIAGNOSIS — L821 Other seborrheic keratosis: Secondary | ICD-10-CM

## 2020-06-23 DIAGNOSIS — D1800 Hemangioma unspecified site: Secondary | ICD-10-CM | POA: Diagnosis not present

## 2020-06-23 DIAGNOSIS — L719 Rosacea, unspecified: Secondary | ICD-10-CM | POA: Diagnosis not present

## 2020-06-23 DIAGNOSIS — D2272 Melanocytic nevi of left lower limb, including hip: Secondary | ICD-10-CM

## 2020-06-23 DIAGNOSIS — Z1283 Encounter for screening for malignant neoplasm of skin: Secondary | ICD-10-CM

## 2020-06-23 DIAGNOSIS — L814 Other melanin hyperpigmentation: Secondary | ICD-10-CM | POA: Diagnosis not present

## 2020-06-23 DIAGNOSIS — D239 Other benign neoplasm of skin, unspecified: Secondary | ICD-10-CM

## 2020-06-23 DIAGNOSIS — L720 Epidermal cyst: Secondary | ICD-10-CM

## 2020-06-23 DIAGNOSIS — Z872 Personal history of diseases of the skin and subcutaneous tissue: Secondary | ICD-10-CM | POA: Diagnosis not present

## 2020-06-23 DIAGNOSIS — D2271 Melanocytic nevi of right lower limb, including hip: Secondary | ICD-10-CM | POA: Diagnosis not present

## 2020-06-23 DIAGNOSIS — L578 Other skin changes due to chronic exposure to nonionizing radiation: Secondary | ICD-10-CM

## 2020-06-23 DIAGNOSIS — D229 Melanocytic nevi, unspecified: Secondary | ICD-10-CM

## 2020-06-23 DIAGNOSIS — D2371 Other benign neoplasm of skin of right lower limb, including hip: Secondary | ICD-10-CM | POA: Diagnosis not present

## 2020-06-23 NOTE — Progress Notes (Signed)
Follow-Up Visit   Subjective  Sonya Lopez is a 65 y.o. female who presents for the following: Total body skin exam (No hx of skin ca, hx of AKs) and Rosacea (Face, 1 yr f/u Perfect A qam, Finacea foam qhs pt d/c Triple cream, not taking doxycycline).  Has SK on R cheek that was frozen in past for cosmetic reasons, but it has recurred in the past 6 months.   The following portions of the chart were reviewed this encounter and updated as appropriate:       Review of Systems:  No other skin or systemic complaints except as noted in HPI or Assessment and Plan.  Objective  Well appearing patient in no apparent distress; mood and affect are within normal limits.  A full examination was performed including scalp, head, eyes, ears, nose, lips, neck, chest, axillae, abdomen, back, buttocks, bilateral upper extremities, bilateral lower extremities, hands, feet, fingers, toes, fingernails, and toenails. All findings within normal limits unless otherwise noted below.  Objective  face: Mild erythema and telangiectasias medial cheeks and chin  Objective  R cheek x 1, L forearm x 1 (2): Waxy tan macule, patch  Images      Objective    Right Popliteal Fossa: 2.13mm brown macule  Left Thigh - Posterior: 1.45mm med dark brown macule  Objective  Right lateral ankle: Firm pink/brown papulenodule with dimple sign.   Objective  L lateral canthus: Tiny firm white papule   Assessment & Plan    Lentigines - Scattered tan macules - Due to sun exposure - Benign-appering, observe - Recommend daily broad spectrum sunscreen SPF 30+ to sun-exposed areas, reapply every 2 hours as needed. - Call for any changes  Seborrheic Keratoses - Stuck-on, waxy, tan-brown papules and/or plaques  - Benign-appearing - Discussed benign etiology and prognosis. - Observe - Call for any changes  Melanocytic Nevi - Tan-brown and/or pink-flesh-colored symmetric macules and papules - Benign  appearing on exam today - Observation - Call clinic for new or changing moles - Recommend daily use of broad spectrum spf 30+ sunscreen to sun-exposed areas.   Hemangiomas - Red papules - Discussed benign nature - Observe - Call for any changes  Actinic Damage - Chronic condition, secondary to cumulative UV/sun exposure - diffuse scaly erythematous macules with underlying dyspigmentation - Recommend daily broad spectrum sunscreen SPF 30+ to sun-exposed areas, reapply every 2 hours as needed.  - Staying in the shade or wearing long sleeves, sun glasses (UVA+UVB protection) and wide brim hats (4-inch brim around the entire circumference of the hat) are also recommended for sun protection.  - Call for new or changing lesions.  Skin cancer screening performed today.  History of PreCancerous Actinic Keratosis  - site(s) of PreCancerous Actinic Keratosis clear today. - these may recur and new lesions may form requiring treatment to prevent transformation into skin cancer - observe for new or changing spots and contact [place] for appointment if occur - photoprotection with sun protective clothing; sunglasses and broad spectrum sunscreen with SPF of at least 30 + and frequent self skin exams recommended - yearly exams by a dermatologist recommended for persons with history of PreCancerous Actinic Keratoses  Rosacea face  Improving, not at goal Rosacea is a chronic progressive skin condition usually affecting the face of adults, causing redness and/or acne bumps. It is treatable but not curable. It sometimes affects the eyes (ocular rosacea) as well. It may respond to topical and/or systemic medication and can flare  with stress, sun exposure, alcohol, exercise and some foods.  Daily application of broad spectrum spf 30+ sunscreen to face is recommended to reduce flares.  Cont Perfect A cream 0.1% but start using at night D/c Finacea foam (too expensive since not on  formulary) Start [facility] Azelaic Acid: 15%, Niacinamide: 2% cr qam Will hold off on doxycycline for now   Seborrheic keratosis (2) R cheek x 1, L forearm x 1  Recurrent (R cheek), post LN2 >2 yrs  Discussed cosmetic procedure (crypotherapy), noncovered.  $60 for 1st lesion and $15 for each additional lesion if done on the same day.  Maximum charge $350.  One touch-up treatment included no charge. Discussed risks of treatment including dyspigmentation, small scar, and/or recurrence. Recommend daily broad spectrum sunscreen SPF 30+/photoprotection to treated areas once healed.     Destruction of lesion - R cheek x 1, L forearm x 1  Destruction method: cryotherapy   Informed consent: discussed and consent obtained   Lesion destroyed using liquid nitrogen: Yes   Region frozen until ice ball extended beyond lesion: Yes   Outcome: patient tolerated procedure well with no complications   Post-procedure details: wound care instructions given   Additional details:  Prior to procedure, discussed risks of blister formation, small wound, skin dyspigmentation, or rare scar following cryotherapy.     Nevus (2) Right Popliteal Fossa; Left Thigh - Posterior  Clear. Observe for recurrence. Call clinic for new or changing lesions.  Recommend regular skin exams, daily broad-spectrum spf 30+ sunscreen use, and photoprotection.     Dermatofibroma Right lateral ankle  Benign-appearing.  Observation.  Call clinic for new or changing moles.  Recommend daily use of broad spectrum spf 30+ sunscreen to sun-exposed areas.    Milia L lateral canthus  Benign, observe.    Discussed extraction if irritated  Return in about 1 year (around 06/23/2021) for TBSE, Hx of AKs, Rosacea.  I, Othelia Pulling, RMA, am acting as scribe for Brendolyn Patty, MD .  Documentation: I have reviewed the above documentation for accuracy and completeness, and I agree with the above.  Brendolyn Patty MD

## 2020-06-23 NOTE — Patient Instructions (Addendum)
If you have any questions or concerns for your doctor, please call our main line at [phone] and press option 4 to reach your doctor's medical assistant. If no one answers, please leave a voicemail as directed and we will return your call as soon as possible. Messages left after 4 pm will be answered the following business day.   You may also send [name] a message via [place]. We typically respond to MyChart messages within 1-2 business days.  For prescription refills, please ask your pharmacy to contact our office. Our fax number is [phone].  If you have an urgent issue when the clinic is closed that cannot wait until the next business day, you can page your doctor at the number below.    Please note that while we do our best to be available for urgent issues outside of office hours, we are not available 24/7.   If you have an urgent issue and are unable to reach [name], you may choose to seek medical care at your doctor's office, retail clinic, urgent care center, or emergency room.  If you have a medical emergency, please immediately call 911 or go to the emergency department.  Pager Numbers  - Dr. Nehemiah Massed: [phone]  - Dr. Laurence Ferrari: [phone]  - Dr. Nicole Kindred: [phone]  In the event of inclement weather, please call our main line at [phone] for an update on the status of any delays or closures.  Dermatology Medication Tips: Please keep the boxes that topical medications come in in order to help keep track of the instructions about where and how to use these. Pharmacies typically print the medication instructions only on the boxes and not directly on the medication tubes.   If your medication is too expensive, please contact our office at [phone] option 4 or send [name] a message through [place].   We are unable to tell what your co-pay for medications will be in advance as this is different depending on your insurance coverage. However, we may be able to find a substitute  medication at lower cost or fill out paperwork to get insurance to cover a needed medication.   If a prior authorization is required to get your medication covered by your insurance company, please allow [name] 1-2 business days to complete this process.  Drug prices often vary depending on where the prescription is filled and some pharmacies may offer cheaper prices.  The website www.goodrx.com contains coupons for medications through different pharmacies. The prices here do not account for what the cost may be with help from insurance (it may be cheaper with your insurance), but the website can give you the price if you did not use any insurance.  - You can print the associated coupon and take it with your prescription to the pharmacy.  - You may also stop by our office during regular business hours and pick up a GoodRx coupon card.  - If you need your prescription sent electronically to a different pharmacy, notify our office through [facility] or by phone at [phone] option 4.   Instructions for [facility] Medications  One or more of your medications was sent to the [facility] mail order compounding pharmacy. You will receive an email from them and can purchase the medicine through that link. It will then be mailed to your home at the address you confirmed. If for any reason you do not receive an email from them, please check your spam folder. If you still do not find the  email, please let us know. [facility] phone number is [phone].  Azelaic Acid: 15% Niacinamide: 2% Vehicle: Cream

## 2020-06-26 DIAGNOSIS — H25811 Combined forms of age-related cataract, right eye: Secondary | ICD-10-CM | POA: Diagnosis not present

## 2020-07-01 ENCOUNTER — Encounter (INDEPENDENT_AMBULATORY_CARE_PROVIDER_SITE_OTHER): Payer: Self-pay | Admitting: Family Medicine

## 2020-07-01 ENCOUNTER — Ambulatory Visit (INDEPENDENT_AMBULATORY_CARE_PROVIDER_SITE_OTHER): Payer: Medicare Other | Admitting: Family Medicine

## 2020-07-01 ENCOUNTER — Other Ambulatory Visit: Payer: Self-pay

## 2020-07-01 VITALS — BP 109/58 | HR 54 | Temp 97.9°F | Ht 66.0 in | Wt 230.0 lb

## 2020-07-01 DIAGNOSIS — R195 Other fecal abnormalities: Secondary | ICD-10-CM | POA: Diagnosis not present

## 2020-07-01 DIAGNOSIS — R0602 Shortness of breath: Secondary | ICD-10-CM

## 2020-07-01 DIAGNOSIS — Z6837 Body mass index (BMI) 37.0-37.9, adult: Secondary | ICD-10-CM

## 2020-07-01 DIAGNOSIS — R5383 Other fatigue: Secondary | ICD-10-CM | POA: Diagnosis not present

## 2020-07-01 DIAGNOSIS — E7849 Other hyperlipidemia: Secondary | ICD-10-CM | POA: Diagnosis not present

## 2020-07-01 DIAGNOSIS — E1169 Type 2 diabetes mellitus with other specified complication: Secondary | ICD-10-CM

## 2020-07-01 DIAGNOSIS — E7800 Pure hypercholesterolemia, unspecified: Secondary | ICD-10-CM

## 2020-07-01 DIAGNOSIS — Z1331 Encounter for screening for depression: Secondary | ICD-10-CM | POA: Diagnosis not present

## 2020-07-01 DIAGNOSIS — E559 Vitamin D deficiency, unspecified: Secondary | ICD-10-CM | POA: Diagnosis not present

## 2020-07-01 DIAGNOSIS — Z0289 Encounter for other administrative examinations: Secondary | ICD-10-CM

## 2020-07-02 LAB — T3: T3, Total: 169 ng/dL (ref 71–180)

## 2020-07-02 LAB — INSULIN, RANDOM: INSULIN: 21.6 u[IU]/mL (ref 2.6–24.9)

## 2020-07-02 LAB — COMPREHENSIVE METABOLIC PANEL
ALT: 14 IU/L (ref 0–32)
AST: 15 IU/L (ref 0–40)
Albumin/Globulin Ratio: 1.8 (ref 1.2–2.2)
Albumin: 4.3 g/dL (ref 3.8–4.8)
Alkaline Phosphatase: 166 IU/L — ABNORMAL HIGH (ref 44–121)
BUN/Creatinine Ratio: 18 (ref 12–28)
BUN: 17 mg/dL (ref 8–27)
Bilirubin Total: 0.3 mg/dL (ref 0.0–1.2)
CO2: 21 mmol/L (ref 20–29)
Calcium: 9.2 mg/dL (ref 8.7–10.3)
Chloride: 103 mmol/L (ref 96–106)
Creatinine, Ser: 0.92 mg/dL (ref 0.57–1.00)
Globulin, Total: 2.4 g/dL (ref 1.5–4.5)
Glucose: 90 mg/dL (ref 65–99)
Potassium: 4.6 mmol/L (ref 3.5–5.2)
Sodium: 141 mmol/L (ref 134–144)
Total Protein: 6.7 g/dL (ref 6.0–8.5)
eGFR: 68 mL/min/{1.73_m2} (ref >59)

## 2020-07-02 LAB — LIPID PANEL WITH LDL/HDL RATIO
Cholesterol, Total: 180 mg/dL (ref 100–199)
HDL: 49 mg/dL (ref >39)
LDL Chol Calc (NIH): 115 mg/dL — ABNORMAL HIGH (ref 0–99)
LDL/HDL Ratio: 2.3 ratio (ref 0.0–3.2)
Triglycerides: 87 mg/dL (ref 0–149)
VLDL Cholesterol Cal: 16 mg/dL (ref 5–40)

## 2020-07-02 LAB — CBC WITH DIFFERENTIAL
Basophils Absolute: 0 10*3/uL (ref 0.0–0.2)
Basos: 1 %
EOS (ABSOLUTE): 0.1 10*3/uL (ref 0.0–0.4)
Eos: 1 %
Hematocrit: 40.7 % (ref 34.0–46.6)
Hemoglobin: 13 g/dL (ref 11.1–15.9)
Immature Grans (Abs): 0 10*3/uL (ref 0.0–0.1)
Immature Granulocytes: 0 %
Lymphocytes Absolute: 1.5 10*3/uL (ref 0.7–3.1)
Lymphs: 19 %
MCH: 29.5 pg (ref 26.6–33.0)
MCHC: 31.9 g/dL (ref 31.5–35.7)
MCV: 93 fL (ref 79–97)
Monocytes Absolute: 0.5 10*3/uL (ref 0.1–0.9)
Monocytes: 7 %
Neutrophils Absolute: 5.8 10*3/uL (ref 1.4–7.0)
Neutrophils: 72 %
RBC: 4.4 x10E6/uL (ref 3.77–5.28)
RDW: 14 % (ref 11.7–15.4)
WBC: 8 10*3/uL (ref 3.4–10.8)

## 2020-07-02 LAB — HEMOGLOBIN A1C
Est. average glucose Bld gHb Est-mCnc: 123 mg/dL
Hgb A1c MFr Bld: 5.9 % — ABNORMAL HIGH (ref 4.8–5.6)

## 2020-07-02 LAB — TSH: TSH: 1.86 u[IU]/mL (ref 0.450–4.500)

## 2020-07-02 LAB — VITAMIN D 25 HYDROXY (VIT D DEFICIENCY, FRACTURES): Vit D, 25-Hydroxy: 64.4 ng/mL (ref 30.0–100.0)

## 2020-07-02 LAB — T4: T4, Total: 9.3 ug/dL (ref 4.5–12.0)

## 2020-07-07 ENCOUNTER — Telehealth (INDEPENDENT_AMBULATORY_CARE_PROVIDER_SITE_OTHER): Payer: Self-pay

## 2020-07-07 NOTE — Telephone Encounter (Signed)
The patient called, left a message that she wants to cancel her new patient follow up appointment because the diet is making her sick and her family wants her to quit.  Please call the patient to see what is going on.  Maybe there is something she can change up that will help her.  If she cancels, she'll have a $50 fee applied to her account.  [phone]

## 2020-07-07 NOTE — Progress Notes (Signed)
Chief Complaint:   OBESITY Sonya Lopez (MR# [id number]) is a 65 y.o. female who presents for evaluation and treatment of obesity and related comorbidities. Current BMI is Body mass index is 37.12 kg/m. Sonya Lopez has been struggling with her weight for many years and has been unsuccessful in either losing weight, maintaining weight loss, or reaching her healthy weight goal.  Sonya Lopez is currently in the action stage of change and ready to dedicate time achieving and maintaining a healthier weight. Sonya Lopez is interested in becoming our patient and working on intensive lifestyle modifications including (but not limited to) diet and exercise for weight loss.  Sonya Lopez's habits were reviewed today and are as follows: Her family eats meals together, her desired weight loss is 30+ lbs, she started gaining weight after her 3rd baby, no sleep or resistance, her heaviest weight ever was 237 pounds, she has significant food cravings issues, she snacks frequently in the evenings, she is frequently drinking liquids with calories, she frequently makes poor food choices, she has problems with excessive hunger, she frequently eats larger portions than normal and she struggles with emotional eating.  Depression Screen [name]'s Food and Mood (modified PHQ-9) score was 11.  Depression screen PHQ 2/9 07/01/2020  Decreased Interest 1  Down, Depressed, Hopeless 1  PHQ - 2 Score 2  Altered sleeping 0  Tired, decreased energy 3  Change in appetite 3  Feeling bad or failure about yourself  0  Trouble concentrating 2  Moving slowly or fidgety/restless 0  Suicidal thoughts 0  PHQ-9 Score 10  Difficult doing work/chores Not difficult at all  Some recent data might be hidden   Subjective:   1. Other fatigue Sonya Lopez admits to daytime somnolence and admits to waking up still tired. Patent has a history of symptoms of daytime fatigue. Sonya Lopez generally gets 6 or 7 hours of sleep per night, and states that she  has generally restful sleep. Snoring is present. Apneic episodes are not present. Epworth Sleepiness Score is 10.  2. SOB (shortness of breath) on exertion Thayer Headings notes increasing shortness of breath with exercising and seems to be worsening over time with weight gain. She notes getting out of breath sooner with activity than she used to. This has not gotten worse recently. Amritha denies shortness of breath at rest or orthopnea.  3. Vitamin D deficiency [name] is on OTC Vit D, and she has no recent labs.  4. Hyperlipidemia, pure Chalene is not on statin, and she is working on diet and exercise.  5. Type 2 diabetes mellitus with other specified complication, without long-term current use of insulin ([place]) Sonya Lopez has a history of diabetes mellitus diagnosis in 2017, by an A1c of 6.7. She has been doing well controlling it with diet and exercise, and her A1c's since then have been well controlled in the pre-diabetic range.  Assessment/Plan:   1. Other fatigue Sonya Lopez does feel that her weight is causing her energy to be lower than it should be. Fatigue may be related to obesity, depression or many other causes. Labs will be ordered, and in the meanwhile, Sonya Lopez will focus on self care including making healthy food choices, increasing physical activity and focusing on stress reduction.  - Comprehensive metabolic panel - TSH - T3 - T4 - CBC With Differential - EKG 12-Lead  2. SOB (shortness of breath) on exertion Sonya Lopez does feel that she gets out of breath more easily that she used to when she exercises. Sonya Lopez's shortness of  breath appears to be obesity related and exercise induced. She has agreed to work on weight loss and gradually increase exercise to treat her exercise induced shortness of breath. Will continue to monitor closely.  3. Vitamin D deficiency Low Vitamin D level contributes to fatigue and are associated with obesity, breast, and colon cancer. We will check labs today. Sonya Lopez  will follow-up for routine testing of Vitamin D, at least 2-3 times per year to avoid over-replacement.  - VITAMIN D 25 Hydroxy (Vit-D Deficiency, Fractures)  4. Hyperlipidemia, pure Cardiovascular risk and specific lipid/LDL goals reviewed. We discussed several lifestyle modifications today. We will check labs today. Sonya Lopez will start her Category 2 plan, and will continue to work on diet, exercise and weight loss efforts. Orders and follow up as documented in patient record.   - Lipid Panel With LDL/HDL Ratio  5. Type 2 diabetes mellitus with other specified complication, without long-term current use of insulin ([facility]) We will check labs today. Sonya Lopez will start her Category 2 plan, and will continue to follow up as directed. Good blood sugar control is important to decrease the likelihood of diabetic complications such as nephropathy, neuropathy, limb loss, blindness, coronary artery disease, and death. Intensive lifestyle modification including diet, exercise and weight loss are the first line of treatment for diabetes.   - Insulin, random - Hemoglobin A1c  6. Screening for depression [name] had a positive depression screening. Depression is commonly associated with obesity and often results in emotional eating behaviors. We will monitor this closely and work on CBT to help improve the non-hunger eating patterns. Referral to Psychology may be required if no improvement is seen as she continues in our clinic.  7. Obesity with current BMI 37.2 Sonya Lopez is currently in the action stage of change and her goal is to continue with weight loss efforts. I recommend Sonya Lopez begin the structured treatment plan as follows:  She has agreed to the Category 2 Plan.  Exercise goals: No exercise has been prescribed for now, while we concentrate on nutritional changes.    Behavioral modification strategies: increasing lean protein intake, meal planning and cooking strategies and dealing with family or  coworker sabotage.  She was informed of the importance of frequent follow-up visits to maximize her success with intensive lifestyle modifications for her multiple health conditions. She was informed we would discuss her lab results at her next visit unless there is a critical issue that needs to be addressed sooner. Micaila agreed to keep her next visit at the agreed upon time to discuss these results.  Objective:   Blood pressure (!) 109/58, pulse (!) 54, temperature 97.9 F (36.6 C), height 5\' 6"  (1.676 m), weight 230 lb (104.3 kg), SpO2 96 %. Body mass index is 37.12 kg/m.  EKG: Normal sinus rhythm, rate 57 BPM.  Indirect Calorimeter completed today shows a VO2 of 127 and a REE of 881.  Her calculated basal metabolic rate is 3329 thus her basal metabolic rate is worse than expected.  General: Cooperative, alert, well developed, in no acute distress. HEENT: Conjunctivae and lids unremarkable. Cardiovascular: Regular rhythm.  Lungs: Normal work of breathing. Neurologic: No focal deficits.   Lab Results  Component Value Date   CREATININE 0.92 07/01/2020   BUN 17 07/01/2020   NA 141 07/01/2020   K 4.6 07/01/2020   CL 103 07/01/2020   CO2 21 07/01/2020   Lab Results  Component Value Date   ALT 14 07/01/2020   AST 15 07/01/2020  ALKPHOS 166 (H) 07/01/2020   BILITOT 0.3 07/01/2020   Lab Results  Component Value Date   HGBA1C 5.9 (H) 07/01/2020   HGBA1C 5.9 (H) 04/17/2020   HGBA1C 5.8 (A) 09/11/2019   HGBA1C 5.6 09/01/2018   HGBA1C 5.5 03/02/2018   Lab Results  Component Value Date   INSULIN 21.6 07/01/2020   Lab Results  Component Value Date   TSH 1.860 07/01/2020   Lab Results  Component Value Date   CHOL 180 07/01/2020   HDL 49 07/01/2020   LDLCALC 115 (H) 07/01/2020   TRIG 87 07/01/2020   CHOLHDL 2.9 09/11/2019   Lab Results  Component Value Date   WBC 8.0 07/01/2020   HGB 13.0 07/01/2020   HCT 40.7 07/01/2020   MCV 93 07/01/2020   PLT 330 04/17/2020    No results found for: IRON, TIBC, FERRITIN Obesity Behavioral Intervention:   Approximately 15 minutes were spent on the discussion below.  ASK: We discussed the diagnosis of obesity with Thayer Headings today and Kismet agreed to give [name] permission to discuss obesity behavioral modification therapy today.  ASSESS: Aniyha has the diagnosis of obesity and her BMI today is 37.14. Selene is in the action stage of change.   ADVISE: Lorenzo was educated on the multiple health risks of obesity as well as the benefit of weight loss to improve her health. She was advised of the need for long term treatment and the importance of lifestyle modifications to improve her current health and to decrease her risk of future health problems.  AGREE: Multiple dietary modification options and treatment options were discussed and Madolyn agreed to follow the recommendations documented in the above note.  ARRANGE: Latika was educated on the importance of frequent visits to treat obesity as outlined per CMS and USPSTF guidelines and agreed to schedule her next follow up appointment today.  Attestation Statements:   Reviewed by clinician on day of visit: allergies, medications, problem list, medical history, surgical history, family history, social history, and previous encounter notes.   I, Trixie Dredge, am acting as transcriptionist for Dennard Nip, MD.  I have reviewed the above documentation for accuracy and completeness, and I agree with the above. - Dennard Nip, MD

## 2020-07-08 ENCOUNTER — Other Ambulatory Visit: Payer: Self-pay | Admitting: General Surgery

## 2020-07-08 DIAGNOSIS — H25811 Combined forms of age-related cataract, right eye: Secondary | ICD-10-CM | POA: Diagnosis not present

## 2020-07-08 NOTE — Progress Notes (Signed)
Subjective:     Patient ID: Sonya Lopez is a 65 y.o. female.  HPI  The following portions of the patient's history were reviewed and updated as appropriate.  This an established patient is here today for: office visit. She is here to discuss having a colonoscopy due to a positive Cologuard. She states she has a history of IBS and bowels have always been irregular. She states that for the last few months the stool has been either "balls" or a "cow patty".  She did seen a pot of bleeding on Saturday morning but none prior.  Review of Systems  Constitutional: Negative for chills and fever.  Respiratory: Negative for cough.        Chief Complaint  Patient presents with  . Pre-op Exam     BP 118/60   Pulse 81   Temp 36.5 C (97.7 F)   Ht 167.6 cm ('5\' 6"' )   Wt (!) 105.7 kg (233 lb)   LMP  (LMP Unknown)   SpO2 94%   BMI 37.61 kg/m       Past Medical History:  Diagnosis Date  . A-fib (CMS-HCC)   . Anxiety   . Benign breast cyst in female, right   . Breast cancer (CMS-HCC) 10/2015   Left, DCIS, high-grade with comedonecrosis, ER 50%, PR 11-50% wide excision/MammoSite.  Antiestrogen therapy x1 year.  . Depression   . Difficult intubation   . Female cystocele   . Fibromyalgia   . History of diabetes mellitus, type II   . History of left breast cancer   . Kidney stones   . Low back pain   . Migraine   . OSA (obstructive sleep apnea)   . PAF (paroxysmal atrial fibrillation) (CMS-HCC)   . Personal history of radiation therapy   . PONV (postoperative nausea and vomiting)   . Syncope   . Trochanteric bursitis of left hip   . Vitreous detachment of right eye           Past Surgical History:  Procedure Laterality Date  . anterior (cystocele) and posterior repair (rectocele)  11/30/2011  . anterior cervical discectomy and fusion C5-7  10/24/2019   Dr Meade Maw at [facility], [place]  . ATRIAL FIBRILLATION ABLATION  03/01/2019  .  bladder suspension  11/30/2011  . BREAST EXCISIONAL BIOPSY Right 2007  . breast lumpectomy with needle localization Left 2017  . CATARACT EXTRACTION  01/2019  . CESAREAN SECTION    . COLONOSCOPY  2009  . CYSTOSCOPY  11/30/2011   urethra  . DILATION AND CURETTAGE, DIAGNOSTIC / THERAPEUTIC  2009  . LAPAROSCOPIC CHOLECYSTECTOMY  2004  . TONSILLECTOMY  1961  . VAGINAL HYSTERECTOMY  11/30/2011              OB History    Gravida  3   Para  3   Term      Preterm      AB      Living        SAB      IAB      Ectopic      Molar      Multiple      Live Births          Obstetric Comments  Age at first period 71 Age of first pregnancy 45         Social History         Socioeconomic History  . Marital status: Married  Tobacco Use  . Smoking  status: Never Smoker  . Smokeless tobacco: Never Used  Substance and Sexual Activity  . Alcohol use: Yes           Allergies  Allergen Reactions  . Adhesive Tape-Silicones Dermatitis  . Cleocin [Clindamycin Hcl] Rash  . Doxycycline Rash  . Erythromycin Rash  . Flagyl [Metronidazole Hcl] Nausea  . Keflet [Cephalexin] Rash  . Macrodantin [Nitrofurantoin Macrocrystal] Vomiting  . Sulfamethoxazole-Trimethoprim Nausea    Current Medications        Current Outpatient Medications  Medication Sig Dispense Refill  . acetaminophen-codeine (TYLENOL #3) 300-30 mg tablet acetaminophen 300 mg-codeine 30 mg tablet    . apixaban (ELIQUIS) 5 mg tablet Take 5 mg by mouth every 12 (twelve) hours    . azelaic acid (FINACEA) 15 % topical gel Apply topically once daily After skin is thoroughly washed and patted dry, gently but thoroughly massage a thin film of azelaic acid cream into the affected area twice daily, in the morning and evening.    . cholecalciferol (VITAMIN D3) 1000 unit tablet Take 1,000 Units by mouth once daily    . cyanocobalamin (VITAMIN B12) 1000 MCG tablet Take by mouth    .  DULoxetine (CYMBALTA) 20 MG DR capsule duloxetine 20 mg capsule,delayed release    . ELDERBERRY FRUIT ORAL Take by mouth once daily    . fremanezumab-vfrm 225 mg/1.5 mL Syrg Inject subcutaneously    . gabapentin (NEURONTIN) 100 MG capsule Take 600 mg by mouth once daily       . L.acidoph-B.long-L.plant-B.lac (PROBIOTIC ACIDOPHILUS BEADS) 2 billion cell Cap Take 1 tablet by mouth once daily    . magnesium oxide 400 mg magnesium Tab Take 1 tablet by mouth once daily    . methocarbamoL (ROBAXIN) 750 MG tablet Take 1 tablet (750 mg total) by mouth every 8 (eight) hours as needed 90 tablet 3  . metoprolol succinate (TOPROL-XL) 25 MG XL tablet     . metoprolol tartrate (LOPRESSOR) 25 MG tablet Take 25 mg by mouth once daily       . naratriptan (AMERGE) 2.5 MG tablet Take 2.5 mg by mouth once as needed for Migraine. May take a second dose after 4 hours if needed.    Marland Kitchen omeprazole (PRILOSEC) 20 MG DR capsule omeprazole 20 mg capsule,delayed release    . propranoloL (INDERAL) 10 MG tablet propranolol 10 mg tablet    . rizatriptan (MAXALT-MLT) 10 MG disintegrating tablet rizatriptan 10 mg disintegrating tablet    . trolamine salicylate (ASPERCREME) 10 % cream Apply topically     No current facility-administered medications for this visit.           Family History  Problem Relation Age of Onset  . High blood pressure (Hypertension) Mother   . Diabetes Mother   . Hearing loss Mother   . Heart disease Father   . High blood pressure (Hypertension) Brother   . Diabetes Brother   . Heart disease Paternal Grandfather   . Asthma Daughter   . Migraines Daughter   . Asthma Daughter   . Fibromyalgia Daughter   . Interstitial cystitis Daughter   . Asthma Daughter   . Ulcerative colitis Daughter   . Thyroid cancer Daughter   . Breast cancer Neg Hx            Objective:   Physical Exam Constitutional:      Appearance: Normal appearance.   Cardiovascular:     Rate and Rhythm: Normal rate and regular rhythm.  Pulses: Normal pulses.     Heart sounds: Normal heart sounds.  Pulmonary:     Effort: Pulmonary effort is normal.     Breath sounds: Normal breath sounds.  Musculoskeletal:     Cervical back: Neck supple.  Skin:    General: Skin is warm and dry.  Neurological:     Mental Status: She is alert and oriented to person, place, and time.  Psychiatric:        Mood and Affect: Mood normal.        Behavior: Behavior normal.    Labs and Radiology:   April 17, 2020 laboratory:  WBC 3.4 - 10.8 x10E3/uL 7.4   RBC 3.77 - 5.28 x10E6/uL 4.21   Hemoglobin 11.1 - 15.9 g/dL 12.9   Hematocrit 34.0 - 46.6 % 39.4   MCV 79 - 97 fL 94   MCH 26.6 - 33.0 pg 30.6   MCHC 31.5 - 35.7 g/dL 32.7   RDW 11.7 - 15.4 % 12.9   Platelets 150 - 450 x10E3/uL 330   Neutrophils Not Estab. % 71   Lymphs Not Estab. % 19   Monocytes Not Estab. % 8   Eos Not Estab. % 1   Basos Not Estab. % 1   Neutrophils Absolute 1.4 - 7.0 x10E3/uL 5.3   Lymphocytes Absolute 0.7 - 3.1 x10E3/uL 1.4   Monocytes Absolute 0.1 - 0.9 x10E3/uL 0.6   EOS (ABSOLUTE) 0.0 - 0.4 x10E3/uL 0.1   Basophils Absolute 0.0 - 0.2 x10E3/uL 0.0   Immature Granulocytes Not Estab. % 0   Immature Grans (Abs) 0.0 - 0.1 x10E3/uL 0.0    Glucose 65 - 99 mg/dL 97   BUN 8 - 27 mg/dL 17   Creatinine, Ser 0.57 - 1.00 mg/dL 0.91   eGFR >59 mL/min/1.73 69   BUN/Creatinine Ratio 12 - 28 19   Sodium 134 - 144 mmol/L 139   Potassium 3.5 - 5.2 mmol/L 4.8   Chloride 96 - 106 mmol/L 102   CO2 20 - 29 mmol/L 25   Calcium 8.7 - 10.3 mg/dL 9.3   Total Protein 6.0 - 8.5 g/dL 6.3   Albumin 3.8 - 4.8 g/dL 4.0   Globulin, Total 1.5 - 4.5 g/dL 2.3   Albumin/Globulin Ratio 1.2 - 2.2 1.7   Bilirubin Total 0.0 - 1.2 mg/dL <0.2   Alkaline Phosphatase 44 - 121 IU/L 168High   AST 0 - 40 IU/L 18   ALT 0 - 32 IU/L 12       Assessment:     Candidate for colonoscopy based on positive  Cologuard testing.    Plan:     Indication colonoscopy reviewed.  Risks associated with the procedure including bleeding and injury to the intestine were discussed with the patient and her husband.  She was instructed in regards to preparation by the staff.  She will be contacted the week of July 07, 2020 to schedule at a convenient time.     This note is partially prepared by Karie Fetch, RN, acting as a scribe in the presence of Dr. Hervey Ard, MD.  The documentation recorded by the scribe accurately reflects the service I personally performed and the decisions made by me.   Robert Bellow, MD FACS

## 2020-07-09 NOTE — Telephone Encounter (Signed)
Pt's call was returned and she was spoken to further. Pt reported that after starting the meal plan prescribed to her at her initial visit, she began experiencing stomach pains, fatigue, and had two bowel movements.  Pt was advised that Dr. Leafy Ro was spoken to and she advised that the pt keep her appt if possible and alternative options could be discusses a that time. Pt did also voice that she was getting pressure from her family to cancel her upcoming visit because they could "tell a difference in her" and they felt like the drastic change was too much for her body and her fibromyalgia to handle all at once.  Pt was advised that this practice and Dr. Leafy Ro are here for the patient in whatever way needed, however, the decision to keep or cancel the appt cannot be made for her, but patient was again reminded that she is encouraged to keep her appt. Pt ultimately decided that at this time, she would like to cancel her appt until further notice.

## 2020-07-10 DIAGNOSIS — Z981 Arthrodesis status: Secondary | ICD-10-CM | POA: Diagnosis not present

## 2020-07-10 DIAGNOSIS — M47812 Spondylosis without myelopathy or radiculopathy, cervical region: Secondary | ICD-10-CM | POA: Diagnosis not present

## 2020-07-11 ENCOUNTER — Encounter: Payer: Self-pay | Admitting: Family Medicine

## 2020-07-15 ENCOUNTER — Ambulatory Visit (INDEPENDENT_AMBULATORY_CARE_PROVIDER_SITE_OTHER): Payer: Medicare Other | Admitting: Family Medicine

## 2020-07-17 DIAGNOSIS — H02834 Dermatochalasis of left upper eyelid: Secondary | ICD-10-CM | POA: Diagnosis not present

## 2020-07-17 DIAGNOSIS — H02831 Dermatochalasis of right upper eyelid: Secondary | ICD-10-CM | POA: Diagnosis not present

## 2020-07-24 NOTE — Progress Notes (Signed)
Electrophysiology Office Note   Date:  07/24/2020   ID:  Zeda, Gangwer [birth year], MRN [id number]  PCP:  Steele Sizer, MD  Cardiologist:  Rockey Situ Primary Electrophysiologist:  Cambell Rickenbach Meredith Leeds, MD    Chief Complaint: AF   History of Present Illness: Sonya Lopez is a 65 y.o. female who is being seen today for the evaluation of AF at the request of Steele Sizer, MD. Presenting today for electrophysiology evaluation.  She has a history of a diabetes, GERD, atrial fibrillation/flutter.  She also has sleep apnea and wears her CPAP.  Atrial fibrillation was diagnosed in 2013 when she presented to the hospital.  She also presented emergency room April 2020.  She had an episode of syncope where she stood up out of the bed.  She was taking both propranolol and flecainide.  She was orthostatic and hypotensive.  She received IV fluids and was cardioverted.  She was switched to amiodarone.  She is now status post ablation 03/01/2019.  Amiodarone has been stopped.  Today, denies symptoms of palpitations, chest pain, shortness of breath, orthopnea, PND, lower extremity edema, claudication, dizziness, presyncope, syncope, bleeding, or neurologic sequela. The patient is tolerating medications without difficulties.  Since being seen, she has done well.  She has had unfortunately 2 episodes of atrial fibrillation, longest 9 hours.  She converted on her own.  She has started a medication, Claritin, prior to these episodes and feels that this was a trigger.   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  MammoSite   Calculus of kidney    Cancer ([place]) 2017   Left- DCIS   Depression    Diet-controlled type 2 diabetes mellitus ([place])    Difficult intubation  2009   [place] had to use  'boogee" DURING GALLBLADDER SURGERY ONLY-NO PROBLEMS SINCE   Female cystocele 01/05/2011   Fibromyalgia    Gallbladder problem    GERD (gastroesophageal reflux disease)    Headache(784.0)    MIGRAINES   History of kidney stones    IBS (irritable bowel syndrome)    Joint pain    Obesity    OSA (obstructive sleep apnea) 03/27/2019   USE CPAP   OSA (obstructive sleep apnea)    Personal history of radiation therapy 2017   LEFT lumpectomy   PONV (postoperative nausea and vomiting)    h/o difficult intubation and anes had to use "boogee"   Vitamin D deficiency    Vitreous detachment of right eye    sees floaters   Past Surgical History:  Procedure Laterality Date   ABDOMINAL HYSTERECTOMY  2013   ANTERIOR AND POSTERIOR REPAIR  11/30/2011   Procedure: ANTERIOR (CYSTOCELE) AND POSTERIOR REPAIR (RECTOCELE);  Surgeon: Reece Packer, MD;  Location: [facility];  Service: Urology;  Laterality: N/A;  with cysto   ANTERIOR CERVICAL DECOMP/DISCECTOMY FUSION N/A 10/24/2019   Procedure: ANTERIOR CERVICAL DECOMPRESSION/DISCECTOMY FUSION 2 LEVELS C5-7;  Surgeon: Meade Maw, MD;  Location: [facility];  Service: Neurosurgery;  Laterality: N/A;   ATRIAL FIBRILLATION ABLATION N/A 03/01/2019   Procedure: ATRIAL FIBRILLATION ABLATION;  Surgeon: Constance Haw, MD;  Location: [facility];  Service: Cardiovascular;  Laterality: N/A;   BLADDER SUSPENSION  11/30/2011   Procedure: [place] PROCEDURE;  Surgeon: Reece Packer, MD;  Location: [facility];  Service: Urology;  Laterality: N/A;  graft 10x6   BREAST BIOPSY Right 2007   negative core   BREAST BIOPSY Left 09/16/2015   DCIS   BREAST LUMPECTOMY Left 09/30/2015   DCIS clear margins and rad tx/HIGH GRADE DUCTAL CARCINOMA IN SITU, COMEDO TYPE   BREAST LUMPECTOMY WITH NEEDLE LOCALIZATION Left 09/30/2015   DCIS excision, MammoSite radiation;  Surgeon: Robert Bellow, MD;  Location: [facility];  Service: General;  Laterality: Left;   CATARACT EXTRACTION Left 01/2019   CESAREAN SECTION     x 1   CHOLECYSTECTOMY  2005   COLONOSCOPY  2009   CYSTOSCOPY  11/30/2011   Procedure: CYSTOSCOPY;  Surgeon: Reece Packer, MD;  Location: [facility];  Service: Urology;;   [place] OF UTERUS  05/05/2007   GALLBLADDER SURGERY  2004   TONSILLECTOMY  1961   VAGINAL HYSTERECTOMY  11/30/2011   Procedure: HYSTERECTOMY VAGINAL;  Surgeon: Emily Filbert, MD;  Location: [facility];  Service: Gynecology;  Laterality: N/A;     Current Outpatient Medications  Medication Sig Dispense Refill   acetaminophen (TYLENOL) 500 MG tablet Take 2 tablets (1,000 mg total) by mouth every 8 (eight) hours. OTC  0   acetaminophen-codeine (TYLENOL #3) 300-30 MG tablet TAKE ONE TABLET BY MOUTH EVERY 4 HOURS AS NEEDED FOR MODERATE OR SEVERE PAIN 20 tablet 0   Cholecalciferol (PA VITAMIN D-3 GUMMY PO) Takes 2 gummies daily     DULoxetine (CYMBALTA) 20 MG capsule Take 2 capsules (40 mg total) by mouth daily. 180 capsule 1   ELDERBERRY PO Take by mouth.     ELIQUIS 5 MG TABS tablet TAKE 1 TABLET BY MOUTH TWICE A DAY 180 tablet 2   FINACEA 15 % FOAM Apply 1 application topically daily as needed (rosacea).      Fremanezumab-vfrm (AJOVY) 225 MG/1.5ML SOAJ Inject 225 mg into the skin every 30 (thirty) days. 1.5 mL 11   gabapentin (NEURONTIN) 300 MG capsule Take 2 capsules (600 mg total) by mouth at bedtime. 180 capsule 1   magnesium oxide (MAG-OX) 400 MG tablet Take 400 mg by mouth every morning.      methocarbamol (ROBAXIN) 500 MG tablet Take 1 tablet (500 mg total) by mouth every 6 (six) hours as needed for muscle spasms. 90 tablet 0   metoprolol succinate (TOPROL-XL) 25 MG 24 hr tablet TAKE ONE TABLET (25 MG) BY MOUTH EVERY DAY 90 tablet 0   omeprazole (PRILOSEC) 20 MG capsule Take 1 capsule (20 mg total) by mouth daily. 90 capsule 1   Probiotic Product (PROBIOTIC PO) Take 1 capsule by mouth daily.       propranolol (INDERAL) 10 MG tablet TAKE ONE TABLET EVERY DAY WHEN TAKING EXTRA AMIODARONE FOR BREAKTHROUGH A-FIB      rizatriptan (MAXALT-MLT) 10 MG disintegrating tablet Take 1 tablet (10 mg total) by mouth as needed for migraine. May repeat in 2 hours if needed 9 tablet 11   No current facility-administered medications for this visit.    Allergies:   Adhesive [tape], Claritin [loratadine], Macrodantin [nitrofurantoin], Metronidazole, Nitrofurantoin macrocrystal, Sulfamethoxazole-trimethoprim, Cephalexin, Clindamycin hcl, Doxycycline, and Erythromycin   Social History:  The patient  reports that she has never smoked. She has never used smokeless tobacco. She reports previous alcohol use. She reports that she does not use drugs.   Family History:  [name] family history includes Asthma in her  daughter, daughter, and daughter; Colitis in her daughter; Depression in her father; Diabetes in her brother and mother; Fibromyalgia in her daughter; Hearing loss in her mother; Heart disease in her father and paternal grandfather; Heart failure in her father; Hypertension in her brother and mother; Interstitial cystitis in her daughter; Kidney disease in her mother; Migraines in her daughter; Obesity in her mother; Sleep apnea in her mother; Stroke in her father and mother; Thyroid cancer (age of onset: 95) in her daughter.   ROS:  Please see the history of present illness.   Otherwise, review of systems is positive for none.   All other systems are reviewed and negative.   PHYSICAL EXAM: VS:  There were no vitals taken for this visit. , BMI There is no height or weight on file to calculate BMI. GEN: Well nourished, well developed, in no acute distress  HEENT: normal  Neck: no JVD, carotid bruits, or masses Cardiac: RRR; no murmurs, rubs, or gallops,no edema  Respiratory:  clear to auscultation bilaterally, normal work of breathing GI: soft, nontender, nondistended, + BS MS: no deformity or atrophy   Skin: warm and dry Neuro:  Strength and sensation are intact Psych: euthymic mood, full affect  EKG:  EKG is ordered today. Personal review of the ekg ordered shows sinus rhythm, rate 64  Recent Labs: 04/17/2020: Platelets 330 07/01/2020: ALT 14; BUN 17; Creatinine, Ser 0.92; Hemoglobin 13.0; Potassium 4.6; Sodium 141; TSH 1.860    Lipid Panel     Component Value Date/Time   CHOL 180 07/01/2020 1015   CHOL 164 04/17/2011 0507   TRIG 87 07/01/2020 1015   TRIG 83 04/17/2011 0507   HDL 49 07/01/2020 1015   HDL 43 04/17/2011 0507   CHOLHDL 2.9 09/11/2019 1109   VLDL 15 01/05/2016 0828   VLDL 17 04/17/2011 0507   LDLCALC 115 (H) 07/01/2020 1015   LDLCALC 110 (H) 09/11/2019 1109   LDLCALC 104 (H) 04/17/2011 0507     Wt Readings from Last 3 Encounters:  07/01/20 230 lb (104.3 kg)  04/17/20 237 lb (107.5 kg)  03/13/20 234 lb 9.6 oz (106.4 kg)      Other studies Reviewed: Additional studies/ records that were reviewed today include: ETT 09/29/17 personally reviewed  Review of the above records today demonstrates:  Normal exercise treadmill study Good exercise tolerance Target heart rate achieved Appropriate recovery after 3 minutes   ASSESSMENT AND PLAN:  1.  Paroxysmal atrial fibrillation/flutter: Status post ablation 03/01/2019.  Currently on Eliquis.  CHA2DS2-VASc of at least 2.  She has had 2 episodes of atrial fibrillation in the last few months.  She feels that it was potentially due to using Claritin.  We Makisha Marrin give her 30 mg of diltiazem to take on an as-needed basis.  2.  Hyperlipidemia: Weight loss encouraged.  Plan per primary cardiology.  3.  Obstructive sleep apnea: CPAP compliance encouraged  Current medicines are reviewed at length with the patient today.   The patient does not have concerns regarding her medicines.  The following changes were made today: As needed diltiazem  Labs/ tests ordered today include:  No orders of the defined types were placed in  this encounter.   Disposition:   FU with [facility] Milnes 6 months  Signed, Judi Jaffe Meredith Leeds, MD  07/24/2020 9:30 AM     [facility] [address] [place] [address] [phone] (office) [phone] (fax)

## 2020-07-25 ENCOUNTER — Ambulatory Visit (INDEPENDENT_AMBULATORY_CARE_PROVIDER_SITE_OTHER): Payer: Medicare Other | Admitting: Cardiology

## 2020-07-25 ENCOUNTER — Other Ambulatory Visit: Payer: Self-pay

## 2020-07-25 ENCOUNTER — Encounter: Payer: Self-pay | Admitting: Cardiology

## 2020-07-25 VITALS — BP 124/68 | HR 64 | Ht 66.0 in | Wt 235.0 lb

## 2020-07-25 DIAGNOSIS — I4892 Unspecified atrial flutter: Secondary | ICD-10-CM

## 2020-07-25 DIAGNOSIS — I480 Paroxysmal atrial fibrillation: Secondary | ICD-10-CM | POA: Diagnosis not present

## 2020-07-25 MED ORDER — METOPROLOL SUCCINATE ER 25 MG PO TB24: ORAL_TABLET | ORAL | 3 refills | Status: DC

## 2020-07-25 MED ORDER — ELIQUIS 5 MG PO TABS: 1.0000 | ORAL_TABLET | Freq: Two times a day (BID) | ORAL | 3 refills | Status: DC

## 2020-07-25 MED ORDER — DILTIAZEM HCL 30 MG PO TABS
30.0000 mg | ORAL_TABLET | Freq: Four times a day (QID) | ORAL | 2 refills | Status: DC | PRN
Start: 2020-07-25 — End: 2022-12-15

## 2020-07-25 NOTE — Patient Instructions (Signed)
Medication Instructions:  Your physician has recommended you make the following change in your medication: Take Diltiazem 30 mg every 6 hours as needed for atrial fibrillation/elevated heart rates  *If you need a refill on your cardiac medications before your next appointment, please call your pharmacy*   Lab Work: None ordered   Testing/Procedures: None ordered   Follow-Up: At [facility], you and your health needs are our priority.  As part of our continuing mission to provide you with exceptional heart care, we have created designated Provider Care Teams.  These Care Teams include your primary Cardiologist (physician) and Advanced Practice Providers (APPs -  Physician Assistants and Nurse Practitioners) who all work together to provide you with the care you need, when you need it.  Your next appointment:   6 month(s)  The format for your next appointment:   In Person  Provider:   You will see one of the following Advanced Practice Providers on your designated Care Team:   Tommye Standard, [place] Legrand Como "Jonni Sanger" Chalmers Cater, [place]    Thank you for choosing [facility]!!   Trinidad Curet, RN [phone]

## 2020-07-28 ENCOUNTER — Other Ambulatory Visit: Payer: Self-pay | Admitting: Family Medicine

## 2020-07-28 DIAGNOSIS — K219 Gastro-esophageal reflux disease without esophagitis: Secondary | ICD-10-CM

## 2020-08-11 DIAGNOSIS — D120 Benign neoplasm of cecum: Secondary | ICD-10-CM | POA: Diagnosis not present

## 2020-08-11 DIAGNOSIS — K635 Polyp of colon: Secondary | ICD-10-CM | POA: Diagnosis not present

## 2020-08-11 DIAGNOSIS — R195 Other fecal abnormalities: Secondary | ICD-10-CM | POA: Diagnosis not present

## 2020-08-11 DIAGNOSIS — G4733 Obstructive sleep apnea (adult) (pediatric): Secondary | ICD-10-CM | POA: Diagnosis not present

## 2020-08-11 LAB — HM COLONOSCOPY

## 2020-08-19 ENCOUNTER — Encounter (INDEPENDENT_AMBULATORY_CARE_PROVIDER_SITE_OTHER): Payer: Self-pay

## 2020-08-22 ENCOUNTER — Telehealth: Payer: Medicare Other | Admitting: Emergency Medicine

## 2020-08-22 DIAGNOSIS — R0981 Nasal congestion: Secondary | ICD-10-CM | POA: Diagnosis not present

## 2020-08-22 MED ORDER — IPRATROPIUM BROMIDE 0.03 % NA SOLN: 2.0000 | Freq: Two times a day (BID) | NASAL | 0 refills | Status: DC

## 2020-08-22 NOTE — Progress Notes (Signed)
E-Visit for Sinus Problems  We are sorry that you are not feeling well.  Here is how we plan to help!  Based on what you have shared with me it looks like you have sinusitis.  Sinusitis is inflammation and infection in the sinus cavities of the head.  Based on your presentation I believe you most likely have Acute Viral Sinusitis.This is an infection most likely caused by a virus. There is not specific treatment for viral sinusitis other than to help you with the symptoms until the infection runs its course.  You may use an oral decongestant such as Mucinex D or if you have glaucoma or high blood pressure use plain Mucinex. Saline nasal spray help and can safely be used as often as needed for congestion, I have prescribed: Ipratropium Bromide nasal spray 0.03% 2 sprays in eah nostril 2-3 times a day.  Your symptoms don't meet criteria for antibiotic use at this time.  IF your symptoms persist for another 3-5 days, you may qualify for an antibiotic at that point.  Providers prescribe antibiotics to treat infections caused by bacteria. Antibiotics are very powerful in treating bacterial infections when they are used properly. To maintain their effectiveness, they should be used only when necessary. Overuse of antibiotics has resulted in the development of superbugs that are resistant to treatment!    After careful review of your answers, I would not recommend an antibiotic for your condition.  Antibiotics are not effective against viruses and therefore should not be used to treat them.  Some authorities believe that zinc sprays or the use of Echinacea may shorten the course of your symptoms.  Sinus infections are not as easily transmitted as other respiratory infection, however we still recommend that you avoid close contact with loved ones, especially the very young and elderly.  Remember to wash your hands thoroughly throughout the day as this is the number one way to prevent the spread of  infection!  Home Care: Only take medications as instructed by your medical team. Do not take these medications with alcohol. A steam or ultrasonic humidifier can help congestion.  You can place a towel over your head and breathe in the steam from hot water coming from a faucet. Avoid close contacts especially the very young and the elderly. Cover your mouth when you cough or sneeze. Always remember to wash your hands.  Get Help Right Away If: You develop worsening fever or sinus pain. You develop a severe head ache or visual changes. Your symptoms persist after you have completed your treatment plan.  Make sure you Understand these instructions. Will watch your condition. Will get help right away if you are not doing well or get worse.   Thank you for choosing an e-visit.  Your e-visit answers were reviewed by a board certified advanced clinical practitioner to complete your personal care plan. Depending upon the condition, your plan could have included both over the counter or prescription medications.  Please review your pharmacy choice. Make sure the pharmacy is open so you can pick up prescription now. If there is a problem, you may contact your provider through [employer] and have the prescription routed to another pharmacy.  Your safety is important to [name]. If you have drug allergies check your prescription carefully.   For the next 24 hours you can use MyChart to ask questions about today's visit, request a non-urgent call back, or ask for a work or school excuse. You will get an email in  the next two days asking about your experience. I hope that your e-visit has been valuable and will speed your recovery.  Approximately 5 minutes was used in reviewing the patient's chart, questionnaire, prescribing medications, and documentation.

## 2020-08-24 DIAGNOSIS — Z20822 Contact with and (suspected) exposure to covid-19: Secondary | ICD-10-CM | POA: Diagnosis not present

## 2020-08-24 DIAGNOSIS — J0100 Acute maxillary sinusitis, unspecified: Secondary | ICD-10-CM | POA: Diagnosis not present

## 2020-09-08 ENCOUNTER — Other Ambulatory Visit: Payer: Self-pay | Admitting: Neurology

## 2020-09-08 DIAGNOSIS — G43711 Chronic migraine without aura, intractable, with status migrainosus: Secondary | ICD-10-CM

## 2020-09-09 NOTE — Progress Notes (Signed)
Name: Sonya Lopez   MRN: [id number]    DOB: 01/14/1956   Date:09/10/2020       Progress Note  Subjective  Chief Complaint  Follow Up  HPI  OSA: Dr. Jaynee Eagles ordered sleep study 07/2018, showed mild OSA, but had hypoxia of 82% and was seeing a pulmonologist but last visit was last year. She  is compliant with CPAP, no longer waking up frequently with headaches, sleeps better at night and wakes up feeling refreshed .    FMS: She states that Cymbalta and Gabapentin seems to be helping with symptoms, she still has mental fogginess. She has PT at [facility] from January till May 2022 and states it helped with symptoms but is starting to bother her again    Bilateral hip pain: seen by [facility] and had  PT ,she also has bursitis, she is now seeing Ortho at [facility] and is having PT with dry needling , she states pain has improved, no pain on her hips at this time   Paroxysmal Afib:  she had syncope twice on April 4th, 2020 Dr. Rockey Situ saw her on April 8th, 2020 and since that time she has been on Amiodarone and metoprolol, but  symptoms did not improve about  two episodes per week that were lasting  about 2-3 hours therefore she was referred to Dr. Curt Bears and had an ablation done on 02/28/2018. She saw Dr. Cheryln Manly in June 2022 and had two episodes of palpitations at home and one lasted 9 hours prior to her visit, he added Cardizem 30 mg to take prn    Migraine Headaches: since started on Ajovy migraines has been controlled, she states migraines were under control, hast to use it every 3 weeks because she has recurrence of migraine at week four   Major Depression  Remission : she is doing better now, Phq 9 is negative    History diabetes  : last abnormal A1C was many years ago. She had labs done in May and A1C was 5.9 % , on diet only, we will remove diagnosis of diabetes to history of diabetes and monitor She denies polyphagia, no polyuria or polydipsia.     GERD: under control with  medication, sometimes has to take one extra pill , discussed trying Pepcid instead of extra prilosec    History of left breast cancer: she had lumpectomy, could not tolerate estrogen suppressive therapy. mammogram is up to date.she is seeing Dr. Fleet Contras .    Morbid Obesity: she has DM, HTN , and BMI above 35. She has been more active, getting 6000 steps daily on average, losing some weight.    DDD cervical spine: She had right upper extremity radiculitis, she has cervical fusion 10/2019 and noticing some left side neck pain again   Patient Active Problem List   Diagnosis Date Noted   History of diabetes mellitus, type II 09/11/2019   OSA (obstructive sleep apnea) 03/27/2019   Central sleep apnea 07/06/2018   Chronic migraine without aura, with intractable migraine, so stated, with status migrainosus 06/20/2018   Syncope 05/10/2018   Pain, elbow joint 02/02/2018   PAF (paroxysmal atrial fibrillation) ([place]) 09/13/2017   Trochanteric bursitis of left hip 06/24/2017   Low back pain 02/22/2017   Benign breast cyst in female, right 05/25/2016   History of left breast cancer 09/16/2015   Long-term use of high-risk medication 02/25/2015   Increased urinary frequency 08/01/2014   Mild major depression ([place]) 07/29/2014   Gastric reflux 07/29/2014  Chronic interstitial cystitis 07/29/2014   Calculus of kidney 07/29/2014   Allergic rhinitis 07/29/2014   Acne erythematosa 07/29/2014   Lumbosacral radiculitis 07/29/2014   Tinnitus 07/29/2014   Mixed incontinence, urge and stress (female) (female) 08/23/2013   Hyperlipidemia 04/25/2013   H/O: hysterectomy 11/30/2011   Atrial flutter ([place]) 05/03/2011   Bladder cystocele 01/05/2011   Migraine without aura and without status migrainosus, not intractable 12/29/2010   Fibromyalgia 12/29/2010   Obesity 12/29/2010   Migraine 12/29/2010    Past Surgical History:  Procedure Laterality Date   ABDOMINAL HYSTERECTOMY  2013   ANTERIOR AND POSTERIOR  REPAIR  11/30/2011   Procedure: ANTERIOR (CYSTOCELE) AND POSTERIOR REPAIR (RECTOCELE);  Surgeon: Reece Packer, MD;  Location: [facility];  Service: Urology;  Laterality: N/A;  with cysto   ANTERIOR CERVICAL DECOMP/DISCECTOMY FUSION N/A 10/24/2019   Procedure: ANTERIOR CERVICAL DECOMPRESSION/DISCECTOMY FUSION 2 LEVELS C5-7;  Surgeon: Meade Maw, MD;  Location: [facility];  Service: Neurosurgery;  Laterality: N/A;   ATRIAL FIBRILLATION ABLATION N/A 03/01/2019   Procedure: ATRIAL FIBRILLATION ABLATION;  Surgeon: Constance Haw, MD;  Location: [facility];  Service: Cardiovascular;  Laterality: N/A;   BLADDER SUSPENSION  11/30/2011   Procedure: [place] PROCEDURE;  Surgeon: Reece Packer, MD;  Location: [facility];  Service: Urology;  Laterality: N/A;  graft 10x6   BREAST BIOPSY Right 2007   negative core   BREAST BIOPSY Left 09/16/2015   DCIS   BREAST LUMPECTOMY Left 09/30/2015   DCIS clear margins and rad tx/HIGH GRADE DUCTAL CARCINOMA IN SITU, COMEDO TYPE   BREAST LUMPECTOMY WITH NEEDLE LOCALIZATION Left 09/30/2015   DCIS excision, MammoSite radiation;  Surgeon: Robert Bellow, MD;  Location: [facility];  Service: General;  Laterality: Left;   CATARACT EXTRACTION Left 01/2019   CESAREAN SECTION     x 1   CHOLECYSTECTOMY  2005   COLONOSCOPY  2009   CYSTOSCOPY  11/30/2011   Procedure: CYSTOSCOPY;  Surgeon: Reece Packer, MD;  Location: [facility];  Service: Urology;;   [place] OF UTERUS  05/05/2007   GALLBLADDER SURGERY  2004   TONSILLECTOMY  1961   VAGINAL HYSTERECTOMY  11/30/2011   Procedure: HYSTERECTOMY VAGINAL;  Surgeon: Emily Filbert, MD;  Location: [facility];  Service: Gynecology;  Laterality: N/A;    Family History  Problem Relation Age of Onset   Diabetes Mother    Hearing loss Mother    Hypertension Mother    Stroke Mother    Kidney disease Mother    Sleep apnea Mother    Obesity Mother    Heart disease Father    Heart failure Father    Stroke  Father    Depression Father    Heart disease Paternal Grandfather    Asthma Daughter    Asthma Daughter    Migraines Daughter    Fibromyalgia Daughter    Interstitial cystitis Daughter    Asthma Daughter    Colitis Daughter    Thyroid cancer Daughter 83   Diabetes Brother    Hypertension Brother    Breast cancer Neg Hx     Social History   Tobacco Use   Smoking status: Never   Smokeless tobacco: Never  Substance Use Topics   Alcohol use: Not Currently    Alcohol/week: 0.0 standard drinks    Comment: Due to AF     Current Outpatient Medications:    acetaminophen (TYLENOL) 500 MG tablet, Take 2 tablets (1,000 mg total) by mouth  every 8 (eight) hours. OTC, Disp: , Rfl: 0   acetaminophen-codeine (TYLENOL #3) 300-30 MG tablet, TAKE ONE TABLET BY MOUTH EVERY 4 HOURS AS NEEDED FOR MODERATE OR SEVERE PAIN, Disp: 20 tablet, Rfl: 0   apixaban (ELIQUIS) 5 MG TABS tablet, Take 1 tablet (5 mg total) by mouth 2 (two) times daily., Disp: 180 tablet, Rfl: 3   Cholecalciferol (PA VITAMIN D-3 GUMMY PO), Takes 2 gummies daily, Disp: , Rfl:    diltiazem (CARDIZEM) 30 MG tablet, Take 1 tablet (30 mg total) by mouth 4 (four) times daily as needed (atrial fibrillation or elevated heart rates)., Disp: 30 tablet, Rfl: 2   DULoxetine (CYMBALTA) 20 MG capsule, Take 2 capsules (40 mg total) by mouth daily., Disp: 180 capsule, Rfl: 1   ELDERBERRY PO, Take by mouth., Disp: , Rfl:    FINACEA 15 % FOAM, Apply 1 application topically daily as needed (rosacea). , Disp: , Rfl:    Fremanezumab-vfrm (AJOVY) 225 MG/1.5ML SOAJ, Inject 225 mg into the skin every 30 (thirty) days., Disp: 1.5 mL, Rfl: 11   gabapentin (NEURONTIN) 300 MG capsule, Take 2 capsules (600 mg total) by mouth at bedtime., Disp: 180 capsule, Rfl: 1   magnesium oxide (MAG-OX) 400 MG tablet, Take 400 mg by mouth every morning. , Disp: , Rfl:    methocarbamol (ROBAXIN) 500 MG tablet, Take 1 tablet (500 mg total) by mouth every 6 (six) hours as  needed for muscle spasms., Disp: 90 tablet, Rfl: 0   metoprolol succinate (TOPROL-XL) 25 MG 24 hr tablet, TAKE ONE TABLET (25 MG) BY MOUTH EVERY DAY, Disp: 90 tablet, Rfl: 3   omeprazole (PRILOSEC) 20 MG capsule, Take 1 capsule (20 mg total) by mouth daily., Disp: 90 capsule, Rfl: 1   Probiotic Product (PROBIOTIC PO), Take 1 capsule by mouth daily. , Disp: , Rfl:    rizatriptan (MAXALT-MLT) 10 MG disintegrating tablet, Take 1 tablet (10 mg total) by mouth as needed for migraine. May repeat in 2 hours if needed, Disp: 9 tablet, Rfl: 11   ipratropium (ATROVENT) 0.03 % nasal spray, Place 2 sprays into both nostrils every 12 (twelve) hours. (Patient not taking: Reported on 09/10/2020), Disp: 30 mL, Rfl: 0   propranolol (INDERAL) 10 MG tablet, TAKE ONE TABLET EVERY DAY WHEN TAKING EXTRA AMIODARONE FOR BREAKTHROUGH A-FIB  (Patient not taking: Reported on 09/10/2020), Disp: , Rfl:   Allergies  Allergen Reactions   Adhesive [Tape] Dermatitis    SKIN TURNS RED-PAPER TAPE OK TO USE   Claritin [Loratadine] Swelling   Macrodantin [Nitrofurantoin] Nausea And Vomiting   Metronidazole Nausea Only   Nitrofurantoin Macrocrystal Nausea And Vomiting   Sulfamethoxazole-Trimethoprim Nausea Only   Cephalexin Rash   Clindamycin Hcl Rash   Doxycycline Swelling and Rash   Erythromycin Rash    I personally reviewed active problem list, medication list, allergies, family history, social history, health maintenance with the patient/caregiver today.   ROS  Constitutional: Negative for fever or weight change.  Respiratory: positive for cough ( had recent URI) but no shortness of breath.   Cardiovascular: Negative for chest pain or palpitations.  Gastrointestinal: Negative for abdominal pain, no bowel changes.  Musculoskeletal: Negative for gait problem or joint swelling.  Skin: Negative for rash.  Neurological: Negative for dizziness , positive for headache.  No other specific complaints in a complete review of  systems (except as listed in HPI above).   Objective  Vitals:   09/10/20 0823  BP: 116/72  Pulse: 83  Resp: 16  Temp: 98.3 F (36.8 C)  SpO2: 96%  Weight: 228 lb (103.4 kg)  Height: '5\' 6"'  (1.676 m)    Body mass index is 36.8 kg/m.  Physical Exam  Constitutional: Patient appears well-developed and well-nourished. Obese  No distress.  HEENT: head atraumatic, normocephalic, pupils equal and reactive to light, neck supple, throat within normal limits Cardiovascular: Normal rate, regular rhythm and normal heart sounds.  No murmur heard. No BLE edema. Pulmonary/Chest: Effort normal and breath sounds normal. No respiratory distress. Abdominal: Soft.  There is no tenderness. Muscular Skeletal: Trigger point positive  Psychiatric: Patient has a normal mood and affect. behavior is normal. Judgment and thought content normal.   Recent Results (from the past 2160 hour(s))  Insulin, random     Status: None   Collection Time: 07/01/20 10:15 AM  Result Value Ref Range   INSULIN 21.6 2.6 - 24.9 uIU/mL  Comprehensive metabolic panel     Status: Abnormal   Collection Time: 07/01/20 10:15 AM  Result Value Ref Range   Glucose 90 65 - 99 mg/dL   BUN 17 8 - 27 mg/dL   Creatinine, Ser 0.92 0.57 - 1.00 mg/dL   eGFR 68 >59 mL/min/1.73   BUN/Creatinine Ratio 18 12 - 28   Sodium 141 134 - 144 mmol/L   Potassium 4.6 3.5 - 5.2 mmol/L   Chloride 103 96 - 106 mmol/L   CO2 21 20 - 29 mmol/L   Calcium 9.2 8.7 - 10.3 mg/dL   Total Protein 6.7 6.0 - 8.5 g/dL   Albumin 4.3 3.8 - 4.8 g/dL   Globulin, Total 2.4 1.5 - 4.5 g/dL   Albumin/Globulin Ratio 1.8 1.2 - 2.2   Bilirubin Total 0.3 0.0 - 1.2 mg/dL   Alkaline Phosphatase 166 (H) 44 - 121 IU/L   AST 15 0 - 40 IU/L   ALT 14 0 - 32 IU/L  Hemoglobin A1c     Status: Abnormal   Collection Time: 07/01/20 10:15 AM  Result Value Ref Range   Hgb A1c MFr Bld 5.9 (H) 4.8 - 5.6 %    Comment:          Prediabetes: 5.7 - 6.4          Diabetes: >6.4           Glycemic control for adults with diabetes: <7.0    Est. average glucose Bld gHb Est-mCnc 123 mg/dL  VITAMIN D 25 Hydroxy (Vit-D Deficiency, Fractures)     Status: None   Collection Time: 07/01/20 10:15 AM  Result Value Ref Range   Vit D, 25-Hydroxy 64.4 30.0 - 100.0 ng/mL    Comment: Vitamin D deficiency has been defined by the Institute of Medicine and an Endocrine Society practice guideline as a level of serum 25-OH vitamin D less than 20 ng/mL (1,2). The Endocrine Society went on to further define vitamin D insufficiency as a level between 21 and 29 ng/mL (2). 1. IOM (Institute of Medicine). 2010. Dietary reference    intakes for calcium and D. [place]: The    [employer]. 2. Holick MF, Binkley Phil Campbell, Bischoff-Ferrari HA, et al.    Evaluation, treatment, and prevention of vitamin D    deficiency: an Endocrine Society clinical practice    guideline. JCEM. 2011 Jul; 96(7):1911-30.   TSH     Status: None   Collection Time: 07/01/20 10:15 AM  Result Value Ref Range   TSH 1.860 0.450 - 4.500 uIU/mL  Lipid Panel With LDL/HDL Ratio  Status: Abnormal   Collection Time: 07/01/20 10:15 AM  Result Value Ref Range   Cholesterol, Total 180 100 - 199 mg/dL   Triglycerides 87 0 - 149 mg/dL   HDL 49 >39 mg/dL   VLDL Cholesterol Cal 16 5 - 40 mg/dL   LDL Chol Calc (NIH) 115 (H) 0 - 99 mg/dL   LDL/HDL Ratio 2.3 0.0 - 3.2 ratio    Comment:                                     LDL/HDL Ratio                                             Men  Women                               1/2 Avg.Risk  1.0    1.5                                   Avg.Risk  3.6    3.2                                2X Avg.Risk  6.2    5.0                                3X Avg.Risk  8.0    6.1   T3     Status: None   Collection Time: 07/01/20 10:15 AM  Result Value Ref Range   T3, Total 169 71 - 180 ng/dL  T4     Status: None   Collection Time: 07/01/20 10:15 AM  Result Value Ref Range   T4, Total  9.3 4.5 - 12.0 ug/dL  CBC With Differential     Status: None   Collection Time: 07/01/20 10:15 AM  Result Value Ref Range   WBC 8.0 3.4 - 10.8 x10E3/uL   RBC 4.40 3.77 - 5.28 x10E6/uL   Hemoglobin 13.0 11.1 - 15.9 g/dL   Hematocrit 40.7 34.0 - 46.6 %   MCV 93 79 - 97 fL   MCH 29.5 26.6 - 33.0 pg   MCHC 31.9 31.5 - 35.7 g/dL   RDW 14.0 11.7 - 15.4 %   Neutrophils 72 Not Estab. %   Lymphs 19 Not Estab. %   Monocytes 7 Not Estab. %   Eos 1 Not Estab. %   Basos 1 Not Estab. %   Neutrophils Absolute 5.8 1.4 - 7.0 x10E3/uL   Lymphocytes Absolute 1.5 0.7 - 3.1 x10E3/uL   Monocytes Absolute 0.5 0.1 - 0.9 x10E3/uL   EOS (ABSOLUTE) 0.1 0.0 - 0.4 x10E3/uL   Basophils Absolute 0.0 0.0 - 0.2 x10E3/uL   Immature Granulocytes 0 Not Estab. %   Immature Grans (Abs) 0.0 0.0 - 0.1 x10E3/uL    PHQ2/9: Depression screen [facility] 2/9 09/10/2020 07/01/2020 04/24/2020 03/13/2020 09/11/2019  Decreased Interest 0 1 0 0 0  Down, Depressed, Hopeless 0 1 0 1 0  PHQ - 2 Score 0 2 0 1 0  Altered  sleeping 0 0 1 0 1  Tired, decreased energy 0 '3 1 1 1  ' Change in appetite 0 3 0 0 1  Feeling bad or failure about yourself  0 0 0 1 1  Trouble concentrating 0 2 0 0 0  Moving slowly or fidgety/restless 0 0 0 0 0  Suicidal thoughts 0 0 0 0 0  PHQ-9 Score 0 '10 2 3 4  ' Difficult doing work/chores - Not difficult at all - - Not difficult at all  Some recent data might be hidden    phq 9 is negative   Fall Risk: Fall Risk  09/10/2020 04/24/2020 03/13/2020 09/11/2019 04/20/2019  Falls in the past year? 0 0 0 0 1  Number falls in past yr: 0 0 0 0 1  Comment - - - - -  Injury with Fall? 0 0 0 0 1  Comment - - - - -  Risk for fall due to : - No Fall Risks - - History of fall(s)  Risk for fall due to: Comment - - - - -  Follow up - Falls prevention discussed - - Falls prevention discussed      Functional Status Survey: Is the patient deaf or have difficulty hearing?: No Does the patient have difficulty seeing, even when  wearing glasses/contacts?: No Does the patient have difficulty concentrating, remembering, or making decisions?: No Does the patient have difficulty walking or climbing stairs?: No Does the patient have difficulty dressing or bathing?: No Does the patient have difficulty doing errands alone such as visiting a doctor's office or shopping?: No    Assessment & Plan  1. Fibromyalgia  - DULoxetine (CYMBALTA) 20 MG capsule; Take 2 capsules (40 mg total) by mouth daily.  Dispense: 180 capsule; Refill: 1  2. Cervical radiculopathy due to degenerative joint disease of spine  - gabapentin (NEURONTIN) 300 MG capsule; Take 2 capsules (600 mg total) by mouth at bedtime.  Dispense: 180 capsule; Refill: 1  3. GERD without esophagitis  - omeprazole (PRILOSEC) 20 MG capsule; Take 1 capsule (20 mg total) by mouth daily.  Dispense: 90 capsule; Refill: 1  4. B12 deficiency   5. Paroxysmal atrial fibrillation (HCC)  Stable   6. Migraine without aura and without status migrainosus, not intractable  Doing well at this time   7. OSA (obstructive sleep apnea)  Continue CPAP   8. History of diabetes mellitus, type II   9. Vitamin D deficiency  Taking supplementation   10. Dyslipidemia   11. Major depression in remission ([place])   12. Sciatica of left side  - gabapentin (NEURONTIN) 300 MG capsule; Take 2 capsules (600 mg total) by mouth at bedtime.  Dispense: 180 capsule; Refill: 1

## 2020-09-10 ENCOUNTER — Other Ambulatory Visit: Payer: Self-pay

## 2020-09-10 ENCOUNTER — Encounter: Payer: Self-pay | Admitting: Family Medicine

## 2020-09-10 ENCOUNTER — Ambulatory Visit (INDEPENDENT_AMBULATORY_CARE_PROVIDER_SITE_OTHER): Payer: Medicare Other | Admitting: Family Medicine

## 2020-09-10 VITALS — BP 116/72 | HR 83 | Temp 98.3°F | Resp 16 | Ht 66.0 in | Wt 228.0 lb

## 2020-09-10 DIAGNOSIS — I480 Paroxysmal atrial fibrillation: Secondary | ICD-10-CM

## 2020-09-10 DIAGNOSIS — M4722 Other spondylosis with radiculopathy, cervical region: Secondary | ICD-10-CM | POA: Diagnosis not present

## 2020-09-10 DIAGNOSIS — Z1211 Encounter for screening for malignant neoplasm of colon: Secondary | ICD-10-CM | POA: Diagnosis not present

## 2020-09-10 DIAGNOSIS — E538 Deficiency of other specified B group vitamins: Secondary | ICD-10-CM

## 2020-09-10 DIAGNOSIS — E559 Vitamin D deficiency, unspecified: Secondary | ICD-10-CM | POA: Diagnosis not present

## 2020-09-10 DIAGNOSIS — E785 Hyperlipidemia, unspecified: Secondary | ICD-10-CM | POA: Diagnosis not present

## 2020-09-10 DIAGNOSIS — M5432 Sciatica, left side: Secondary | ICD-10-CM

## 2020-09-10 DIAGNOSIS — G4733 Obstructive sleep apnea (adult) (pediatric): Secondary | ICD-10-CM

## 2020-09-10 DIAGNOSIS — M797 Fibromyalgia: Secondary | ICD-10-CM | POA: Diagnosis not present

## 2020-09-10 DIAGNOSIS — K219 Gastro-esophageal reflux disease without esophagitis: Secondary | ICD-10-CM | POA: Diagnosis not present

## 2020-09-10 DIAGNOSIS — F325 Major depressive disorder, single episode, in full remission: Secondary | ICD-10-CM

## 2020-09-10 DIAGNOSIS — G43009 Migraine without aura, not intractable, without status migrainosus: Secondary | ICD-10-CM

## 2020-09-10 DIAGNOSIS — Z8639 Personal history of other endocrine, nutritional and metabolic disease: Secondary | ICD-10-CM

## 2020-09-10 MED ORDER — OMEPRAZOLE 20 MG PO CPDR: 20.0000 mg | DELAYED_RELEASE_CAPSULE | Freq: Every day | ORAL | 1 refills | Status: DC

## 2020-09-10 MED ORDER — DULOXETINE HCL 20 MG PO CPEP
40.0000 mg | ORAL_CAPSULE | Freq: Every day | ORAL | 1 refills | Status: DC
Start: 2020-09-10 — End: 2021-03-13

## 2020-09-10 MED ORDER — GABAPENTIN 300 MG PO CAPS: 600.0000 mg | ORAL_CAPSULE | Freq: Every day | ORAL | 1 refills | Status: DC

## 2020-09-26 DIAGNOSIS — M9901 Segmental and somatic dysfunction of cervical region: Secondary | ICD-10-CM | POA: Diagnosis not present

## 2020-09-26 DIAGNOSIS — M9903 Segmental and somatic dysfunction of lumbar region: Secondary | ICD-10-CM | POA: Diagnosis not present

## 2020-09-26 DIAGNOSIS — M9906 Segmental and somatic dysfunction of lower extremity: Secondary | ICD-10-CM | POA: Diagnosis not present

## 2020-09-26 DIAGNOSIS — M9902 Segmental and somatic dysfunction of thoracic region: Secondary | ICD-10-CM | POA: Diagnosis not present

## 2020-09-29 DIAGNOSIS — M9903 Segmental and somatic dysfunction of lumbar region: Secondary | ICD-10-CM | POA: Diagnosis not present

## 2020-09-29 DIAGNOSIS — M9901 Segmental and somatic dysfunction of cervical region: Secondary | ICD-10-CM | POA: Diagnosis not present

## 2020-09-29 DIAGNOSIS — M9906 Segmental and somatic dysfunction of lower extremity: Secondary | ICD-10-CM | POA: Diagnosis not present

## 2020-09-29 DIAGNOSIS — M9902 Segmental and somatic dysfunction of thoracic region: Secondary | ICD-10-CM | POA: Diagnosis not present

## 2020-09-30 DIAGNOSIS — M9903 Segmental and somatic dysfunction of lumbar region: Secondary | ICD-10-CM | POA: Diagnosis not present

## 2020-09-30 DIAGNOSIS — M9906 Segmental and somatic dysfunction of lower extremity: Secondary | ICD-10-CM | POA: Diagnosis not present

## 2020-09-30 DIAGNOSIS — M9901 Segmental and somatic dysfunction of cervical region: Secondary | ICD-10-CM | POA: Diagnosis not present

## 2020-09-30 DIAGNOSIS — M9902 Segmental and somatic dysfunction of thoracic region: Secondary | ICD-10-CM | POA: Diagnosis not present

## 2020-10-01 DIAGNOSIS — M9903 Segmental and somatic dysfunction of lumbar region: Secondary | ICD-10-CM | POA: Diagnosis not present

## 2020-10-01 DIAGNOSIS — M9901 Segmental and somatic dysfunction of cervical region: Secondary | ICD-10-CM | POA: Diagnosis not present

## 2020-10-01 DIAGNOSIS — M9906 Segmental and somatic dysfunction of lower extremity: Secondary | ICD-10-CM | POA: Diagnosis not present

## 2020-10-01 DIAGNOSIS — M9902 Segmental and somatic dysfunction of thoracic region: Secondary | ICD-10-CM | POA: Diagnosis not present

## 2020-10-07 DIAGNOSIS — M9901 Segmental and somatic dysfunction of cervical region: Secondary | ICD-10-CM | POA: Diagnosis not present

## 2020-10-07 DIAGNOSIS — M9906 Segmental and somatic dysfunction of lower extremity: Secondary | ICD-10-CM | POA: Diagnosis not present

## 2020-10-07 DIAGNOSIS — M9902 Segmental and somatic dysfunction of thoracic region: Secondary | ICD-10-CM | POA: Diagnosis not present

## 2020-10-07 DIAGNOSIS — M9903 Segmental and somatic dysfunction of lumbar region: Secondary | ICD-10-CM | POA: Diagnosis not present

## 2020-10-08 DIAGNOSIS — M9906 Segmental and somatic dysfunction of lower extremity: Secondary | ICD-10-CM | POA: Diagnosis not present

## 2020-10-08 DIAGNOSIS — M9902 Segmental and somatic dysfunction of thoracic region: Secondary | ICD-10-CM | POA: Diagnosis not present

## 2020-10-08 DIAGNOSIS — M9901 Segmental and somatic dysfunction of cervical region: Secondary | ICD-10-CM | POA: Diagnosis not present

## 2020-10-08 DIAGNOSIS — M9903 Segmental and somatic dysfunction of lumbar region: Secondary | ICD-10-CM | POA: Diagnosis not present

## 2020-10-09 DIAGNOSIS — M9902 Segmental and somatic dysfunction of thoracic region: Secondary | ICD-10-CM | POA: Diagnosis not present

## 2020-10-09 DIAGNOSIS — M9903 Segmental and somatic dysfunction of lumbar region: Secondary | ICD-10-CM | POA: Diagnosis not present

## 2020-10-09 DIAGNOSIS — M9901 Segmental and somatic dysfunction of cervical region: Secondary | ICD-10-CM | POA: Diagnosis not present

## 2020-10-09 DIAGNOSIS — M9906 Segmental and somatic dysfunction of lower extremity: Secondary | ICD-10-CM | POA: Diagnosis not present

## 2020-10-10 DIAGNOSIS — M9902 Segmental and somatic dysfunction of thoracic region: Secondary | ICD-10-CM | POA: Diagnosis not present

## 2020-10-10 DIAGNOSIS — M9906 Segmental and somatic dysfunction of lower extremity: Secondary | ICD-10-CM | POA: Diagnosis not present

## 2020-10-10 DIAGNOSIS — M9901 Segmental and somatic dysfunction of cervical region: Secondary | ICD-10-CM | POA: Diagnosis not present

## 2020-10-10 DIAGNOSIS — M9903 Segmental and somatic dysfunction of lumbar region: Secondary | ICD-10-CM | POA: Diagnosis not present

## 2020-10-13 DIAGNOSIS — M9903 Segmental and somatic dysfunction of lumbar region: Secondary | ICD-10-CM | POA: Diagnosis not present

## 2020-10-13 DIAGNOSIS — M9902 Segmental and somatic dysfunction of thoracic region: Secondary | ICD-10-CM | POA: Diagnosis not present

## 2020-10-13 DIAGNOSIS — M9906 Segmental and somatic dysfunction of lower extremity: Secondary | ICD-10-CM | POA: Diagnosis not present

## 2020-10-13 DIAGNOSIS — M9901 Segmental and somatic dysfunction of cervical region: Secondary | ICD-10-CM | POA: Diagnosis not present

## 2020-10-14 DIAGNOSIS — M9902 Segmental and somatic dysfunction of thoracic region: Secondary | ICD-10-CM | POA: Diagnosis not present

## 2020-10-14 DIAGNOSIS — M9901 Segmental and somatic dysfunction of cervical region: Secondary | ICD-10-CM | POA: Diagnosis not present

## 2020-10-14 DIAGNOSIS — M9906 Segmental and somatic dysfunction of lower extremity: Secondary | ICD-10-CM | POA: Diagnosis not present

## 2020-10-14 DIAGNOSIS — M9903 Segmental and somatic dysfunction of lumbar region: Secondary | ICD-10-CM | POA: Diagnosis not present

## 2020-10-15 DIAGNOSIS — M9901 Segmental and somatic dysfunction of cervical region: Secondary | ICD-10-CM | POA: Diagnosis not present

## 2020-10-15 DIAGNOSIS — M9903 Segmental and somatic dysfunction of lumbar region: Secondary | ICD-10-CM | POA: Diagnosis not present

## 2020-10-15 DIAGNOSIS — M9902 Segmental and somatic dysfunction of thoracic region: Secondary | ICD-10-CM | POA: Diagnosis not present

## 2020-10-15 DIAGNOSIS — M9906 Segmental and somatic dysfunction of lower extremity: Secondary | ICD-10-CM | POA: Diagnosis not present

## 2020-10-16 NOTE — Telephone Encounter (Signed)
Spoke to patient and aware we have still not received.  [place] myself and spoke w/ Jacqlyn Larsen. Requested she fax to another number... [phone].  Aware I will get to precert team stat being they need to schedule pt as soon as possible.

## 2020-10-17 ENCOUNTER — Telehealth: Payer: Self-pay | Admitting: *Deleted

## 2020-10-17 NOTE — Telephone Encounter (Signed)
   [facility] Pre-operative Risk Assessment    Patient Name: Sonya Lopez  DOB: 1955-02-11 MRN: [id number]  HEARTCARE STAFF:  - IMPORTANT!!!!!! Under Visit Info/Reason for Call, type in Other and utilize the format Clearance MM/DD/YY or Clearance TBD. Do not use dashes or single digits. - Please review there is not already an duplicate clearance open for this procedure. - If request is for dental extraction, please clarify the # of teeth to be extracted. - If the patient is currently at the dentist's office, call Pre-Op Callback Staff (MA/nurse) to input urgent request.  - If the patient is not currently in the dentist office, please route to the Pre-Op pool.  Request for surgical clearance:  What type of surgery is being performed?  BLEPHAROPLASTY BOTH UPPER EYELIDS  When is this surgery scheduled?  WAS 10/10/20 BUT HAD TO CANCEL WAITING ON CLEARANCE  What type of clearance is required (medical clearance vs. Pharmacy clearance to hold med vs. Both)?  PHARMACY  Are there any medications that need to be held prior to surgery and how long?  ELIQUIS X'S Comfort name and name of physician performing surgery?  [facility] / DR. HAWKINS  What is the office phone number?  [phone]   7.   What is the office fax number?  [phone]  8.   Anesthesia type (None, local, MAC, general) ? ATIVAN 0.5 MG X'S 3 TABLETS, LOCAL    Jeanann Lewandowsky 10/17/2020, 9:29 AM  _________________________________________________________________   (provider comments below)

## 2020-10-17 NOTE — Telephone Encounter (Signed)
Pt made aware that paperwork received and given to pre op clearance team She appreciates the help with this.

## 2020-10-20 DIAGNOSIS — M9902 Segmental and somatic dysfunction of thoracic region: Secondary | ICD-10-CM | POA: Diagnosis not present

## 2020-10-20 DIAGNOSIS — M9901 Segmental and somatic dysfunction of cervical region: Secondary | ICD-10-CM | POA: Diagnosis not present

## 2020-10-20 DIAGNOSIS — M9903 Segmental and somatic dysfunction of lumbar region: Secondary | ICD-10-CM | POA: Diagnosis not present

## 2020-10-20 DIAGNOSIS — M9906 Segmental and somatic dysfunction of lower extremity: Secondary | ICD-10-CM | POA: Diagnosis not present

## 2020-10-20 NOTE — Telephone Encounter (Signed)
Attempted to contact as part of preoperative protocol on 10/20/2020.  Left message to call back.  Patient is call back.

## 2020-10-20 NOTE — Telephone Encounter (Signed)
Patient with diagnosis of afib on Eliquis for anticoagulation.    Procedure: BLEPHAROPLASTY BOTH UPPER EYELIDS Date of procedure: TBD  CHA2DS2-VASc Score = 3   This indicates a 3.2% annual risk of stroke. [name] score is based upon: CHF History: 0 HTN History: 0 Diabetes History: 1 Stroke History: 0 Vascular Disease History: 0 Age Score: 1 Gender Score: 1    CrCl 45 ml/min  Per office protocol, patient can hold Eliquis for 2-3 days prior to procedure.    No need to hold Eliquis for 4 days.

## 2020-10-20 NOTE — Telephone Encounter (Signed)
Patient returning call. She states to call her mobile number.

## 2020-10-21 DIAGNOSIS — M9903 Segmental and somatic dysfunction of lumbar region: Secondary | ICD-10-CM | POA: Diagnosis not present

## 2020-10-21 DIAGNOSIS — M9901 Segmental and somatic dysfunction of cervical region: Secondary | ICD-10-CM | POA: Diagnosis not present

## 2020-10-21 DIAGNOSIS — M9902 Segmental and somatic dysfunction of thoracic region: Secondary | ICD-10-CM | POA: Diagnosis not present

## 2020-10-21 DIAGNOSIS — M9906 Segmental and somatic dysfunction of lower extremity: Secondary | ICD-10-CM | POA: Diagnosis not present

## 2020-10-21 NOTE — Telephone Encounter (Signed)
Pt is returning call in reference to her Pre Op Clearance. Please call pt's cell phone [phone]

## 2020-10-21 NOTE — Telephone Encounter (Signed)
   Name: Sonya Lopez  DOB: [birth year]  MRN: [id number]   Primary Cardiologist: Dr. Curt Bears  Chart reviewed as part of pre-operative protocol coverage. Patient was contacted 10/21/2020 in reference to pre-operative risk assessment for pending surgery as outlined below.  Sonya Lopez was last seen on 07/2020 by Dr. Curt Bears. I reached out to patient for update on how she is doing. The patient affirms she has been doing well without any new cardiac symptoms. She has not felt any breakthrough afib since last OV. Therefore, based on ACC/AHA guidelines, the patient would be at acceptable risk for the planned procedure without further cardiovascular testing. The patient was advised that if she develops new symptoms prior to surgery to contact our office to arrange for a follow-up visit, and she verbalized understanding.  Regarding anticoagulation, per pharmacist, Per office protocol, patient can hold Eliquis for 2-3 days prior to procedure. No need to hold Eliquis for 4 days.  I will route this recommendation to the requesting party via Epic fax function and remove from pre-op pool. Please call with questions.  Charlie Pitter, PA-C 10/21/2020, 6:19 PM

## 2020-10-22 ENCOUNTER — Telehealth: Payer: Self-pay

## 2020-10-22 ENCOUNTER — Telehealth (INDEPENDENT_AMBULATORY_CARE_PROVIDER_SITE_OTHER): Payer: Medicare Other | Admitting: Neurology

## 2020-10-22 DIAGNOSIS — M4722 Other spondylosis with radiculopathy, cervical region: Secondary | ICD-10-CM | POA: Diagnosis not present

## 2020-10-22 DIAGNOSIS — Z09 Encounter for follow-up examination after completed treatment for conditions other than malignant neoplasm: Secondary | ICD-10-CM | POA: Diagnosis not present

## 2020-10-22 DIAGNOSIS — G43711 Chronic migraine without aura, intractable, with status migrainosus: Secondary | ICD-10-CM | POA: Diagnosis not present

## 2020-10-22 DIAGNOSIS — E538 Deficiency of other specified B group vitamins: Secondary | ICD-10-CM | POA: Diagnosis not present

## 2020-10-22 DIAGNOSIS — Z23 Encounter for immunization: Secondary | ICD-10-CM | POA: Diagnosis not present

## 2020-10-22 DIAGNOSIS — R200 Anesthesia of skin: Secondary | ICD-10-CM

## 2020-10-22 DIAGNOSIS — G9782 Other postprocedural complications and disorders of nervous system: Secondary | ICD-10-CM

## 2020-10-22 MED ORDER — RIZATRIPTAN BENZOATE 10 MG PO TBDP: 10.0000 mg | ORAL_TABLET | ORAL | 4 refills | Status: DC | PRN

## 2020-10-22 MED ORDER — EMGALITY 120 MG/ML ~~LOC~~ SOAJ
120.0000 mg | SUBCUTANEOUS | 11 refills | Status: DC
Start: 2020-10-22 — End: 2021-05-04

## 2020-10-22 NOTE — Telephone Encounter (Signed)
I have submitted a PA request on CMM, Key: [id number] - PA Case ID: [id number].   Awaiting determination from [place] [employer]

## 2020-10-22 NOTE — Progress Notes (Signed)
[facility]    Provider:  Dr Sonya Lopez Requesting Provider: Steele Sizer, MD Primary Care Provider:  Steele Sizer, MD  CC:  Back pain, migraines, nack pain  Virtual Visit via Telephone Note  I connected with Sonya Lopez on 10/22/20 at  1:00 PM EDT by telephone and verified that I am speaking with the correct person using two identifiers.  Location: Patient: home Provider: office   I discussed the limitations, risks, security and privacy concerns of performing an evaluation and management service by telephone and the availability of in person appointments. I also discussed with the patient that there may be a patient responsible charge related to this service. The patient expressed understanding and agreed to proceed.    I discussed the assessment and treatment plan with the patient. The patient was provided an opportunity to ask questions and all were answered. The patient agreed with the plan and demonstrated an understanding of the instructions.   The patient was advised to call back or seek an in-person evaluation if the symptoms worsen or if the condition fails to improve as anticipated.  I provided 21 minutes of non-face-to-face time during this encounter.   Sonya Beam, MD  Interval history: 10/22/2020  1.  Migraines, she is doing extremely well, only about 4 mild migraines a month, Maxalt is useful, Tylenol with codeine only when needed her migraines severe or refractory.  We will have to change her from [employer] to [employer] due to insurance but she feels these injections have helped her tremendously. 2.  She is still on her CPAP machine, her morning headaches have significantly reduced 3.  She had ACDF, she does feel much better, she is going to a chiropractor who is also helping her with her hip pain and overall she feels less pain in general which is excellent.  She still has numbness in the area of the scar but that likely will go  away. 4.  Her A. fib is under control, she states she has only had 2 episodes recently.  Update April 17, 2020: Patient was diagnosed with mild obstructive sleep apnea but hypoxia of 82% and is now under the care of the pulmonology groups, she feels improved is using CPAP and is compliant.  Cymbalta and gabapentin seem to be helping with pain symptoms.  She is seeing orthopedics for bilateral hip pain.  She has a history of atrial ablation, she had A. fib and had ablation done on February 20, 2018 she still on Eliquis and metoprolol but has been doing better.  For her migraines controlled since being on Ajovy, but increasing episodes of the past few months.  She is status post cervical fusion and decompression, with improved radiculopathy of the right arm.  She is being seen by Dr. Ancil Lopez for depression, history of diabetes, obesity.  She has history of B12 deficiency, last B12 in August 2021 was 269 we may consider repeating.  She has been having migraines 2-3 days a month. She tries naratriptan and maxalt, sometimes that helps but sometimes not, she had dry needling.she had it done in PT and her bursitis and sciatic and hip, she loves her therapist Sonya Lopez) with [facility] in [place]. Tried rizatriptan, naratriptan, sumatriptan and not working will try [place]. And also will try Ajovy every 3 weeks. We discussed fatigue, will order labs. Will do ajovy every 3 weeks.   Update 10/16/2019: She has been having severe neck pain and radiation. MRi showed broad based disc protrusion c5-c6  causing severe right and moderate left foraminal stenosis, c6-c7 central right paracentral disc protrusion causing right ventral spinal cord abutment (reviewed CD images she brought today). I discussed surgery and agree she needs ACDF. She also has degenrative disease in the lumbar spine but not severe and feeling better in the low back, she is having left-sided sciatica, it comes and goes but doesn't stay. We  reviewed her MRI lumbar spine images with arthtritic changes and mild forminal stenosis but no nerve pinching.   No afib since the ablation. She is going to come off the Eliquis 3 days prior to surgery.  As far as migraines she is doing better, Ajovy alone is helping and the ones she does have a few and they are very responsive to acute medications. Will hold off on botox. Will give her 6 months samples of Ajovy and see if we can get insurance to approve Ajovy if not we will try Emgality.   HPI 06/2018:  Sonya Lopez is a lovely 65 y.o. female here as requested by Sonya Sizer, MD for migraines. PMHx migraines, lumbar radiculopathy, obesity, kidney stones, fibromyalgia, atrial fibrillation on long-term anticoagulation, diet-controlled diabetes 2, depression, breast neoplasm, anxiety.  Symptoms started in 1993. Bending over in the garden and felt a sharp pain in the left buttocks and hips. Worsening since then. Diagnosed with bursitis and lumbar radic. She always has pain down the left leg. Worse bending over to clean bathtub. Starts in the buttocks and has pain in the buttocks (points to the bursa) but radiates down the back of the leg and tender in the lateral thigh. She has been to orthopaedics. She had physical therapy which helped the right side which started hurting but the left side never improved. She went back to orthopaedics. It aches all the time. Shooting pain bending over, when she walks a long time, she has sit down after walking a certain distance to rest. Resting and sitting helps, but sitting too long makes it worse. Getting up and walking helps if she doesn't go too far. She has paresthesias in the left lower leg. She also feels weakness in her left leg, her leg will "give out" slightly not enough to fall but weakness and sititing down helps. Weakness worse going up the steps.   She has left-side pain in the arm as well. She has tightness in the cervical areas. A lot of tightness.  She has neck pain but doesn't c/o of radicular pain. And this hurts with the migraines as well. Discussed dry needling.She has tenderness at the base of the ventral palms. No numbness or tingling, no weakness. Worse with typing a lot during the day, no nocturnal awakenings.Consider wrist splints.   Migraines: She has a history of migraines. She has a headache 18 days of the month. 8-10 migraine days a month. She uses a triptan which helps. She takes amerge. She sometimes has to take a second. Some it doesn't. Migraines are on the left side, behind the eye sna temple, radiates to the left side of the head and into the left cervical muscles. Migrianes are oderately severe or severe, hurts so badly she can;t keep her eye open, nausea, no vomiting, light and sound sensitivity. movement makes it worse, ice helps. Unilateral, usually on the left but can be on the right. Ongoing at this frequency for > 10 years. Even since the 70s. She has just lived with it. In the last 10 years they are better than they were but still  affecting life and can be very bad. She can't move, lay in a dark room. No new vision changes. She wakes up with headache in the morning very often. She has afib, she snore sometimes, really fatigued, never wakes feeling rested, she wakes frequently, fatigued all day. She was diagnosed with fibromyalgia in 12/2000. She had pressure points, pain, sore, burning, difficulty concentrating, fatigue, tinging in the left toe and has some itching in the toe. She has had rashes on her feet and upper legs, she has joint pain in different areas, her fingers get sore. She has dry mouth, no color changes I the fingers, she gets blisters on her tongue a lot and inner cheeks.   She feels her word-finding skills are impaired. She has been intelligent, she works hard, she was evaluated in the past for skipping grades growing up. She feels the word finding. It will eventually. She is tired. She has mild depression. She  has been taking care of everyone else, her daughter, her mother, hr grandchild and she is tired and worn out. She taking care of herself more, feels it is getting better.   Meds tried: lyrica, Cymbalta, gabapentin, metoprolol, amerge, imipamine, propranolol, venlafaxine  Reviewed notes, labs and imaging from outside physicians, which showed:  Ct showed No acute intracranial abnormalities including mass lesion or mass effect, hydrocephalus, extra-axial fluid collection, midline shift, hemorrhage, or acute infarction, large ischemic events (personally reviewed images) 02/2018 ldl 108 hgba1c 5.5 Creating, BUN normal  02/2017: B12 643 Vit D 35 HgbA1c 6.4 TSH nml  Review of Systems: Patient complains of symptoms per HPI as well as the following symptoms: none . Pertinent negatives and positives per HPI. All others negative     Social History   Socioeconomic History   Marital status: Married    Spouse name: Environmental consultant    Number of children: 3   Years of education: 16   Highest education level: Bachelor's degree (e.g., BA, AB, BS)  Occupational History   Occupation: Network engineer  Tobacco Use   Smoking status: Never   Smokeless tobacco: Never  Vaping Use   Vaping Use: Never used  Substance and Sexual Activity   Alcohol use: Not Currently    Alcohol/week: 0.0 standard drinks    Comment: Due to AF   Drug use: No   Sexual activity: Yes    Partners: Male    Birth control/protection: Surgical  Other Topics Concern   Not on file  Social History Narrative   Lives at home with husband and one daughter   Right handed   Caffeine: about 1 cup daily   Social Determinants of Health   Financial Resource Strain: Low Risk    Difficulty of Paying Living Expenses: Not hard at all  Food Insecurity: No Food Insecurity   Worried About Charity fundraiser in the Last Year: Never true   [place] in the Last Year: Never true  Transportation Needs: No Transportation Needs   Lack of  Transportation (Medical): No   Lack of Transportation (Non-Medical): No  Physical Activity: Inactive   Days of Exercise per Week: 0 days   Minutes of Exercise per Session: 0 min  Stress: No Stress Concern Present   Feeling of Stress : Not at all  Social Connections: Socially Integrated   Frequency of Communication with Friends and Family: More than three times a week   Frequency of Social Gatherings with Friends and Family: More than three times a week   Attends Religious  Services: More than 4 times per year   Active Member of Clubs or Organizations: Yes   Attends Music therapist: More than 4 times per year   Marital Status: Married  Human resources officer Violence: Not At Risk   Fear of Current or Ex-Partner: No   Emotionally Abused: No   Physically Abused: No   Sexually Abused: No    Family History  Problem Relation Age of Onset   Diabetes Mother    Hearing loss Mother    Hypertension Mother    Stroke Mother    Kidney disease Mother    Sleep apnea Mother    Obesity Mother    Heart disease Father    Heart failure Father    Stroke Father    Depression Father    Heart disease Paternal Grandfather    Asthma Daughter    Asthma Daughter    Migraines Daughter    Fibromyalgia Daughter    Interstitial cystitis Daughter    Asthma Daughter    Colitis Daughter    Thyroid cancer Daughter 63   Diabetes Brother    Hypertension Brother    Breast cancer Neg Hx     Past Medical History:  Diagnosis Date   Actinic keratosis    Allergy    Anxiety    situational anxiety   Arrhythmia    A-fib/A-flutter   Atrial flutter ([place])    Breast neoplasm, Tis (DCIS), 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  MammoSite   Calculus of kidney    Cancer ([place]) 2017   Left- DCIS   Depression    Diet-controlled type 2 diabetes mellitus ([place])    Difficult intubation 2009   [place] had to use  'boogee" DURING GALLBLADDER  SURGERY ONLY-NO PROBLEMS SINCE   Female cystocele 01/05/2011   Fibromyalgia    Gallbladder problem    GERD (gastroesophageal reflux disease)    Headache(784.0)    MIGRAINES   History of kidney stones    IBS (irritable bowel syndrome)    Joint pain    Obesity    OSA (obstructive sleep apnea) 03/27/2019   USE CPAP   OSA (obstructive sleep apnea)    Personal history of radiation therapy 2017   LEFT lumpectomy   PONV (postoperative nausea and vomiting)    h/o difficult intubation and anes had to use "boogee"   Vitamin D deficiency    Vitreous detachment of right eye    sees floaters    Patient Active Problem List   Diagnosis Date Noted   History of diabetes mellitus, type II 09/11/2019   OSA (obstructive sleep apnea) 03/27/2019   Pain, elbow joint 02/02/2018   PAF (paroxysmal atrial fibrillation) ([place]) 09/13/2017   Trochanteric bursitis of left hip 06/24/2017   Low back pain 02/22/2017   Benign breast cyst in female, right 05/25/2016   History of left breast cancer 09/16/2015   Long-term use of high-risk medication 02/25/2015   Mild major depression ([place]) 07/29/2014   Gastric reflux 07/29/2014   Chronic interstitial cystitis 07/29/2014   Calculus of kidney 07/29/2014   Allergic rhinitis 07/29/2014   Acne erythematosa 07/29/2014   Lumbosacral radiculitis 07/29/2014   Tinnitus 07/29/2014   Mixed incontinence, urge and stress (female) (female) 08/23/2013   Hyperlipidemia 04/25/2013   H/O: hysterectomy 11/30/2011   Atrial flutter ([place]) 05/03/2011   Bladder cystocele 01/05/2011   Migraine without aura and without status migrainosus, not intractable 12/29/2010   Fibromyalgia 12/29/2010  Obesity 12/29/2010    Past Surgical History:  Procedure Laterality Date   ABDOMINAL HYSTERECTOMY  2013   ANTERIOR AND POSTERIOR REPAIR  11/30/2011   Procedure: ANTERIOR (CYSTOCELE) AND POSTERIOR REPAIR (RECTOCELE);  Surgeon: Reece Packer, MD;  Location: [facility];  Service: Urology;   Laterality: N/A;  with cysto   ANTERIOR CERVICAL DECOMP/DISCECTOMY FUSION N/A 10/24/2019   Procedure: ANTERIOR CERVICAL DECOMPRESSION/DISCECTOMY FUSION 2 LEVELS C5-7;  Surgeon: Meade Maw, MD;  Location: [facility];  Service: Neurosurgery;  Laterality: N/A;   ATRIAL FIBRILLATION ABLATION N/A 03/01/2019   Procedure: ATRIAL FIBRILLATION ABLATION;  Surgeon: Constance Haw, MD;  Location: [facility];  Service: Cardiovascular;  Laterality: N/A;   BLADDER SUSPENSION  11/30/2011   Procedure: [place] PROCEDURE;  Surgeon: Reece Packer, MD;  Location: [facility];  Service: Urology;  Laterality: N/A;  graft 10x6   BREAST BIOPSY Right 2007   negative core   BREAST BIOPSY Left 09/16/2015   DCIS   BREAST LUMPECTOMY Left 09/30/2015   DCIS clear margins and rad tx/HIGH GRADE DUCTAL CARCINOMA IN SITU, COMEDO TYPE   BREAST LUMPECTOMY WITH NEEDLE LOCALIZATION Left 09/30/2015   DCIS excision, MammoSite radiation;  Surgeon: Robert Bellow, MD;  Location: [facility];  Service: General;  Laterality: Left;   CATARACT EXTRACTION Left 01/2019   CESAREAN SECTION     x 1   CHOLECYSTECTOMY  2005   COLONOSCOPY  2009   CYSTOSCOPY  11/30/2011   Procedure: CYSTOSCOPY;  Surgeon: Reece Packer, MD;  Location: [facility];  Service: Urology;;   [place] OF UTERUS  05/05/2007   GALLBLADDER SURGERY  2004   TONSILLECTOMY  1961   VAGINAL HYSTERECTOMY  11/30/2011   Procedure: HYSTERECTOMY VAGINAL;  Surgeon: Emily Filbert, MD;  Location: [facility];  Service: Gynecology;  Laterality: N/A;    Current Outpatient Medications  Medication Sig Dispense Refill   acetaminophen (TYLENOL) 500 MG tablet Take 2 tablets (1,000 mg total) by mouth every 8 (eight) hours. OTC  0   acetaminophen-codeine (TYLENOL #3) 300-30 MG tablet TAKE ONE TABLET BY MOUTH EVERY 4 HOURS AS NEEDED FOR MODERATE OR SEVERE PAIN 20 tablet 0   apixaban (ELIQUIS) 5 MG TABS tablet Take 1 tablet (5 mg total) by mouth 2 (two) times daily. 180  tablet 3   Cholecalciferol (PA VITAMIN D-3 GUMMY PO) Takes 2 gummies daily     diltiazem (CARDIZEM) 30 MG tablet Take 1 tablet (30 mg total) by mouth 4 (four) times daily as needed (atrial fibrillation or elevated heart rates). 30 tablet 2   DULoxetine (CYMBALTA) 20 MG capsule Take 2 capsules (40 mg total) by mouth daily. 180 capsule 1   ELDERBERRY PO Take by mouth.     FINACEA 15 % FOAM Apply 1 application topically daily as needed (rosacea).      gabapentin (NEURONTIN) 300 MG capsule Take 2 capsules (600 mg total) by mouth at bedtime. 180 capsule 1   magnesium oxide (MAG-OX) 400 MG tablet Take 400 mg by mouth every morning.      methocarbamol (ROBAXIN) 500 MG tablet Take 1 tablet (500 mg total) by mouth every 6 (six) hours as needed for muscle spasms. 90 tablet 0   metoprolol succinate (TOPROL-XL) 25 MG 24 hr tablet TAKE ONE TABLET (25 MG) BY MOUTH EVERY DAY 90 tablet 3   omeprazole (PRILOSEC) 20 MG capsule Take 1 capsule (20 mg total) by mouth daily. 90 capsule 1   Probiotic Product (MISC INTESTINAL FLORA  REGULAT) CAPS Take by mouth.     Probiotic Product (PROBIOTIC PO) Take 1 capsule by mouth daily.      rizatriptan (MAXALT-MLT) 10 MG disintegrating tablet Take 1 tablet (10 mg total) by mouth as needed for migraine. May repeat in 2 hours if needed 27 tablet 4   trolamine salicylate (ASPERCREME) 10 % cream Apply topically.     No current facility-administered medications for this visit.    Allergies as of 10/22/2020 - Review Complete 09/10/2020  Allergen Reaction Noted   Adhesive [tape] Dermatitis 11/23/2011   Claritin [loratadine] Swelling 07/01/2020   Macrodantin [nitrofurantoin] Nausea And Vomiting 11/15/2011   Metronidazole Nausea Only 07/29/2014   Nitrofurantoin macrocrystal Nausea And Vomiting 01/31/2015   Sulfamethoxazole-trimethoprim Nausea Only 07/29/2014   Cephalexin Rash 12/28/2010   Clindamycin hcl Rash 01/31/2015   Doxycycline Swelling and Rash 12/28/2010   Erythromycin  Rash 11/15/2011    Vitals: There were no vitals taken for this visit. Last Weight:  Wt Readings from Last 1 Encounters:  09/10/20 228 lb (103.4 kg)   Last Height:   Ht Readings from Last 1 Encounters:  09/10/20 5\' 6"  (1.676 m)   Patient is alert, oriented, good historian, voice intact, hearing intact, denies any focal neurologic symptoms, visit with over the phone today, intact recent and remote history, normal concentration, pleasant, no acute distress.   Assessment/Plan:  Sonya Lopez is a lovely 65 y.o. female here as requested by Sonya Sizer, MD for migraines. PMHx migraines, lumbar and cervical  radiculopathy, obesity, kidney stones, fibromyalgia, atrial fibrillation on long-term anticoagulation, diet-controlled diabetes 2, depression, breast neoplasm, anxiety.    - Patient was diagnosed with mild obstructive sleep apnea but hypoxia of 82% and is now under the care of the pulmonology groups, she feels improved is using CPAP and is compliant.  Morning headaches improved.  - Cymbalta and gabapentin seem to be helping with pain symptoms.  However Gabapentin can cause fatigue, recommend she try to decrease or use prn  - She is seeing chiropractor for bilateral hip pain. Dry needling has helped tremendously and she is having it done on her cervical muscles as well.  She feels much better overall as far as pain.  -  She has a history of atrial ablation, she had A. fib and had ablation done on February 20, 2018 she still on Eliquis and metoprolol but has been doing better. No more episodes of heart palpitations.  She is only had 2 in recent months.  - For her migraines controlled since being on Ajovy, It wears out by the end of the month. Try it q3weeks.  At this time we have to change to [facility] unfortunately I will call in a prescription.  For acute management continue Maxalt and Tylenol 3 for severe refractory migraines.  Only having 3-4 a month.  -  She is status post  cervical fusion and decompression, with improved radiculopathy of the right arm.  She is seeing a chiropractor and feeling much better.  She still has the numbness in her neck at the scar but that will likely be permanent.  - Obesity and fatigue: Referred in the past to [facility] wellness center  - She is being seen by Dr. Ancil Lopez for depression, history of diabetes, obesity.  Continue.  - She has history of B12 deficiency, last B12 in August 2021 was 269 repeat in March 2022 was 845 which is great  - Tried rizatriptan, naratriptan, sumatriptan and not working will tried [place].  Continue Maxalt and Tylenol 3 with severe refractory migraines however much improved only 3-4 mild migraines a month.  -Fatigue improved.  Meds ordered this encounter  Medications   rizatriptan (MAXALT-MLT) 10 MG disintegrating tablet    Sig: Take 1 tablet (10 mg total) by mouth as needed for migraine. May repeat in 2 hours if needed    Dispense:  27 tablet    Refill:  4    This is a 52-month supply    No orders of the defined types were placed in this encounter.    PRIOR: - She has been having severe neck pain and radiation. MRi showed broad based disc protrusion c5-c6 causing severe right and moderate left foraminal stenosis, c6-c7 central right paracentral disc protrusion causing right ventral spinal cord abutment (reviewed CD images she brought today). I discussed surgery and agree she needs ACDF. She is scheduled next week.  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 We reviewed her MRI lumbar spine images with arthtritic changes and mild forminal stenosis but no frank nerve pinching. Still if symptoms worsen she may consider ESI and if severe would consider surgical intervention because of some broad disk bulges l4/l5 and l5/s1  - No afib since the ablation. She is  going to come off the Eliquis 3 days prior to surgery. There is risk of stroke during those 3 days but much less due to treatment of afib. Discussed.  - As far as migraines she is doing better, Ajovy alone is helping and the ones she does have a few and they are very responsive to acute medications. Will hold off on botox. Will give her 6 months samples of Ajovy and see if we can get insurance to approve Ajovy if not we will try Emgality.   - For her cervical myofascial pain syndrome:  Dry needling of the cervical muscles. [employer]. DRY NEEDLING: Patient has cervical myofascial pain syndrome, cervicalgia causing disability, pain, decreased range of motion, and contributing to migraines. Referred in the past, hold off for now.  -Hand pain: likely due to cervical disc disease (surgery next week) but if symptoms continue and she has CTS then may need emg/ncs in the future, may be overuse, consider wrist splints when typing a lot or with symptoms and will follow clinically.   -Patient being treated for sleep apnea   No orders of the defined types were placed in this encounter.  Meds ordered this encounter  Medications   rizatriptan (MAXALT-MLT) 10 MG disintegrating tablet    Sig: Take 1 tablet (10 mg total) by mouth as needed for migraine. May repeat in 2 hours if needed    Dispense:  27 tablet    Refill:  4    This is a 38-month supply     Cc: Sonya Sizer, MD   Sarina Ill, MD  [facility] [address] [place], [place] [address]  Phone [phone] Fax [phone]

## 2020-10-24 IMAGING — CR DG CERVICAL SPINE 2 OR 3 VIEWS
1 series · 2 of 2 positions shown · non-contrast
Comparison: None.

CLINICAL DATA: C5-C7 ACDF

EXAM:
CERVICAL SPINE - 2-3 VIEW

[Series 1: dg cervical spine 2 or 3 views · 0.14mm/px · 2 of 2 slices shown]
[im 1/2]
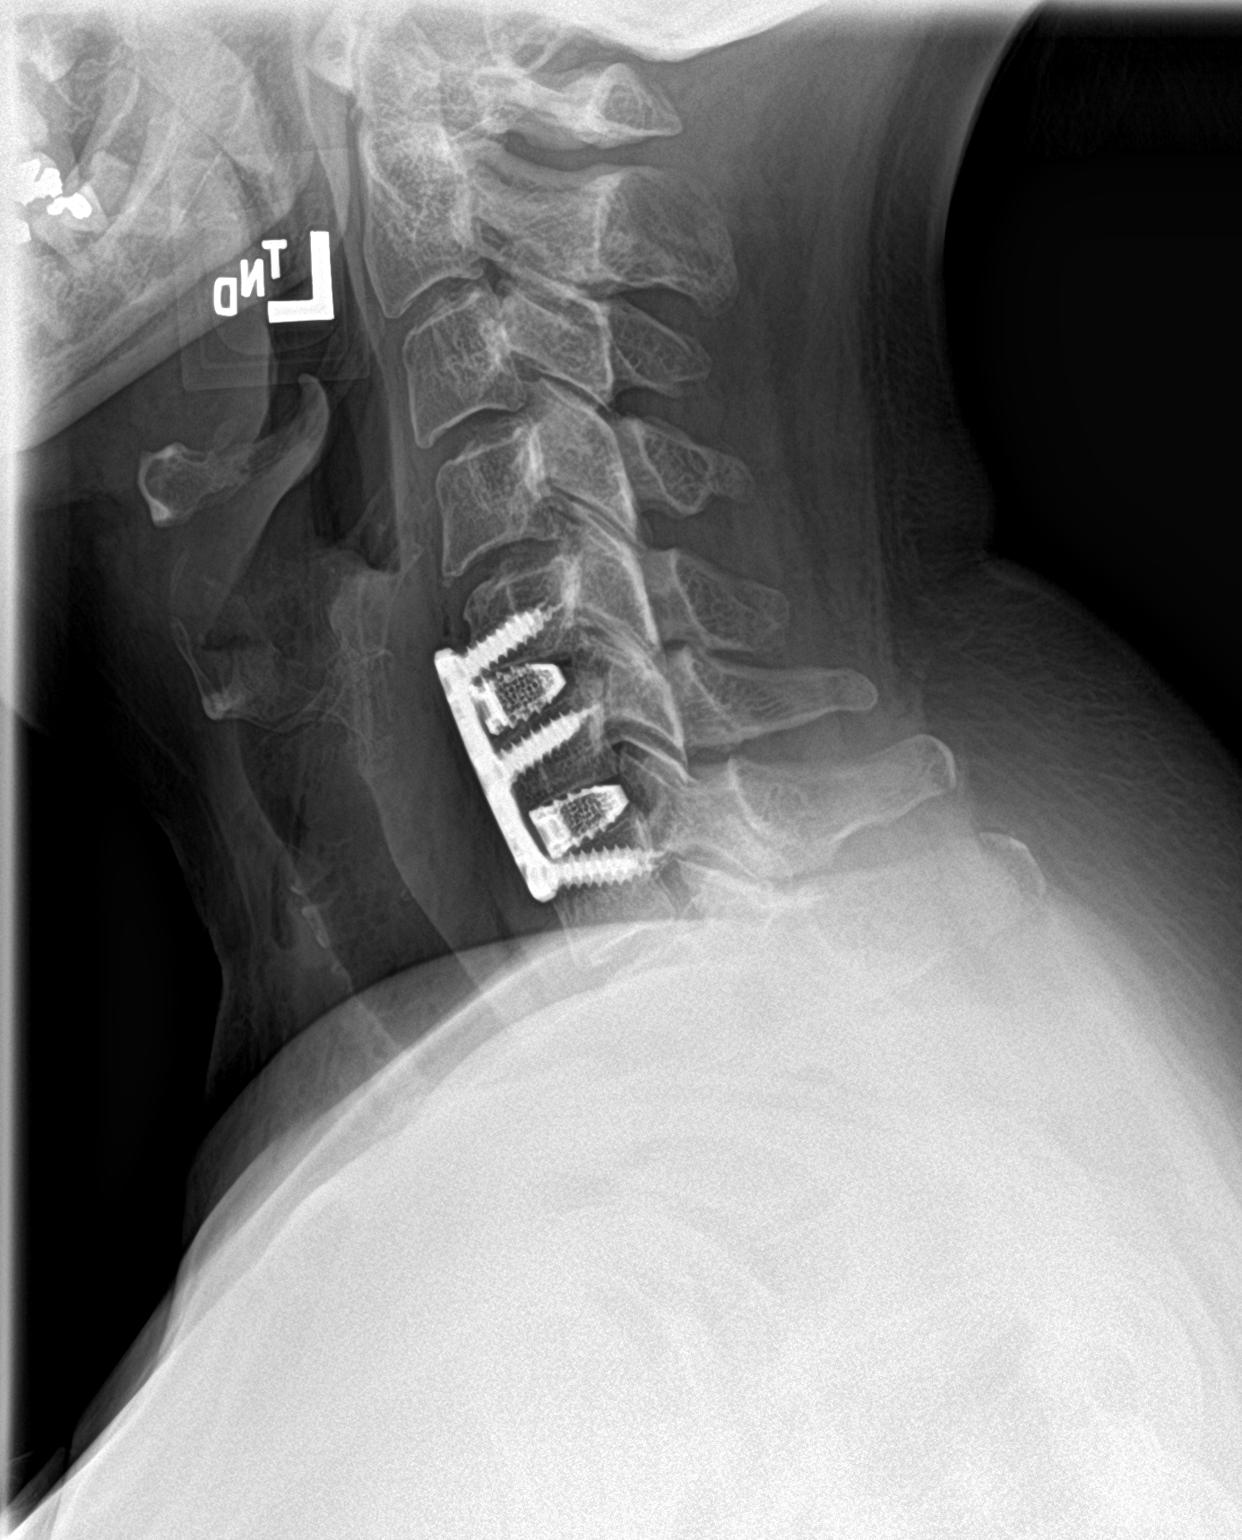
[im 2/2]
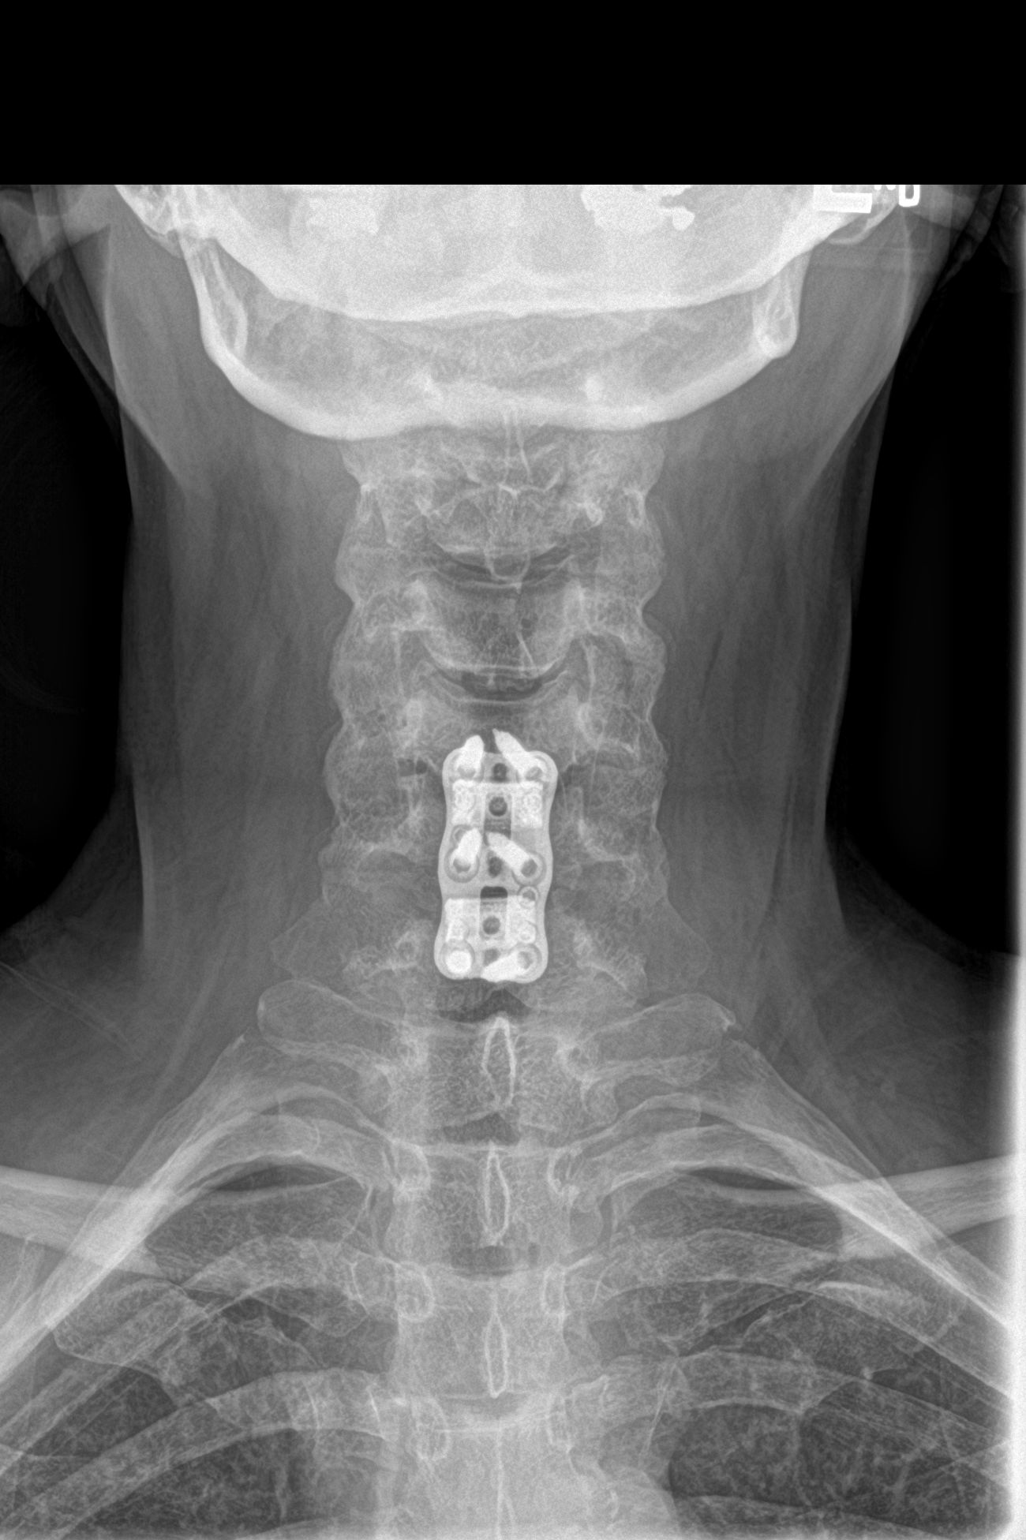

[2 of 2 positions shown; findings below may reference images not displayed]

FINDINGS: Frontal and lateral views of the cervical spine demonstrate ACDF
spanning C5 through C7. Alignment is anatomic. No complication. Mild
prevertebral soft tissue edema at the surgical site. Airways patent.
IMPRESSION: 1. ACDF C5-C7 as above.

## 2020-10-24 IMAGING — RF DG CERVICAL SPINE 2 OR 3 VIEWS
1 series · 3 of 3 positions shown · non-contrast
Comparison: None.

CLINICAL DATA: Surgery

EXAM:
CERVICAL SPINE - 2-3 VIEW

[Series 1: dg x-ray · 0.20mm/px · 3 of 3 slices shown]
[im 1/3]
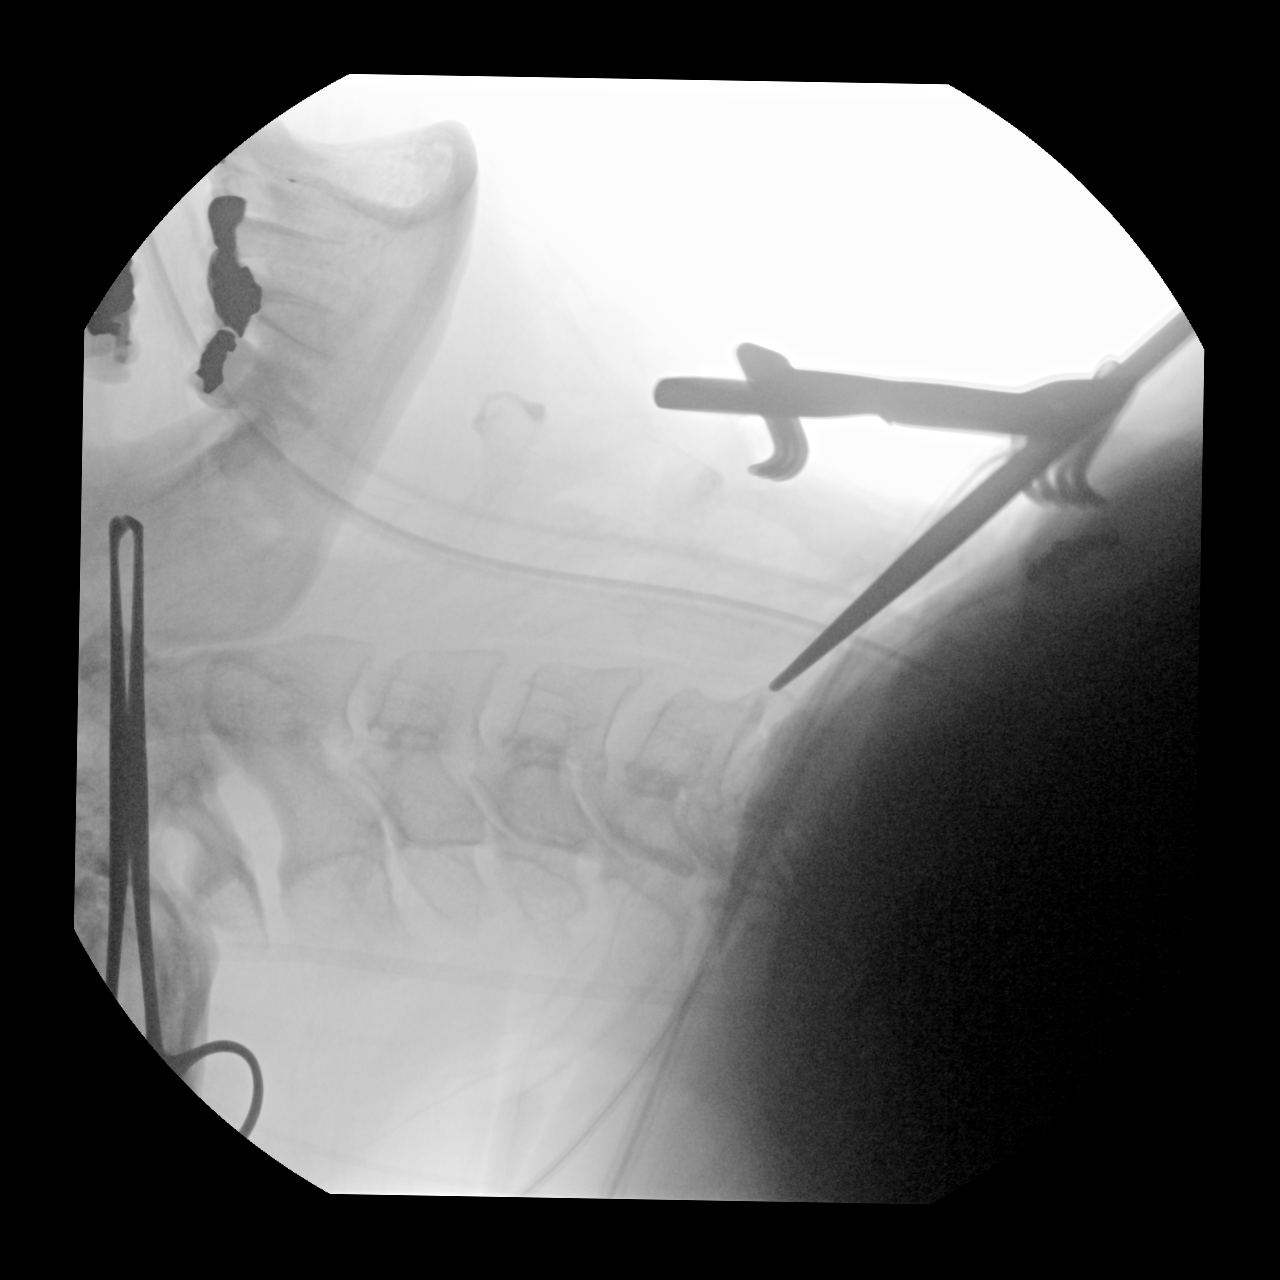
[im 2/3]
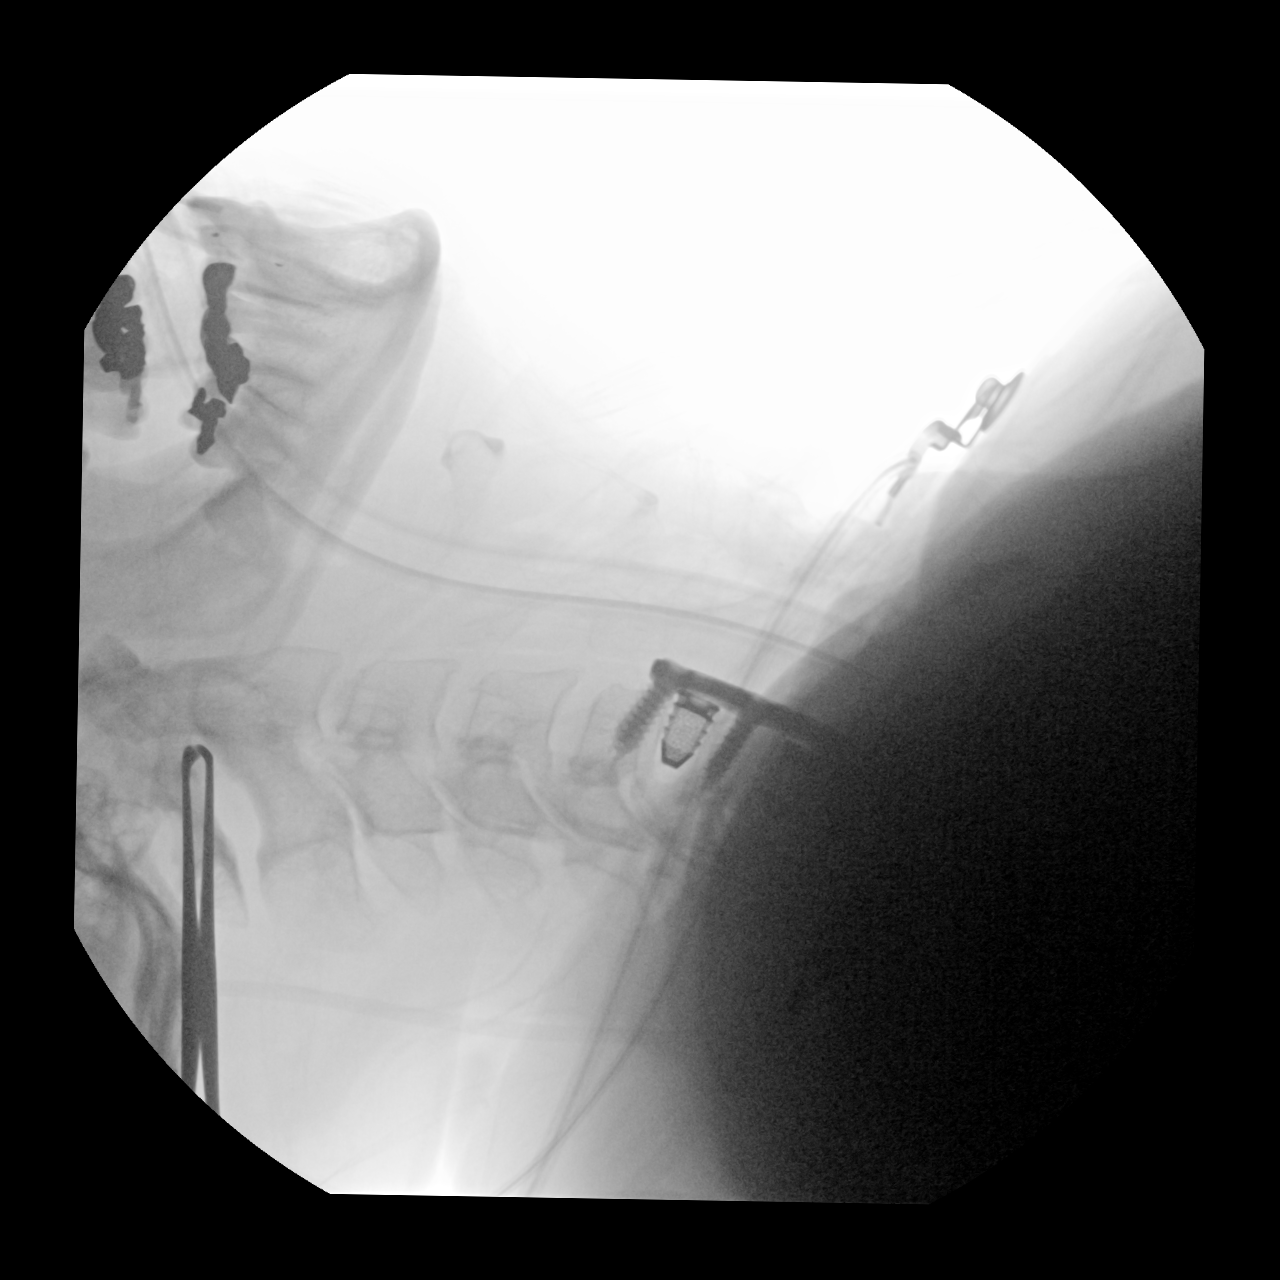
[im 3/3]
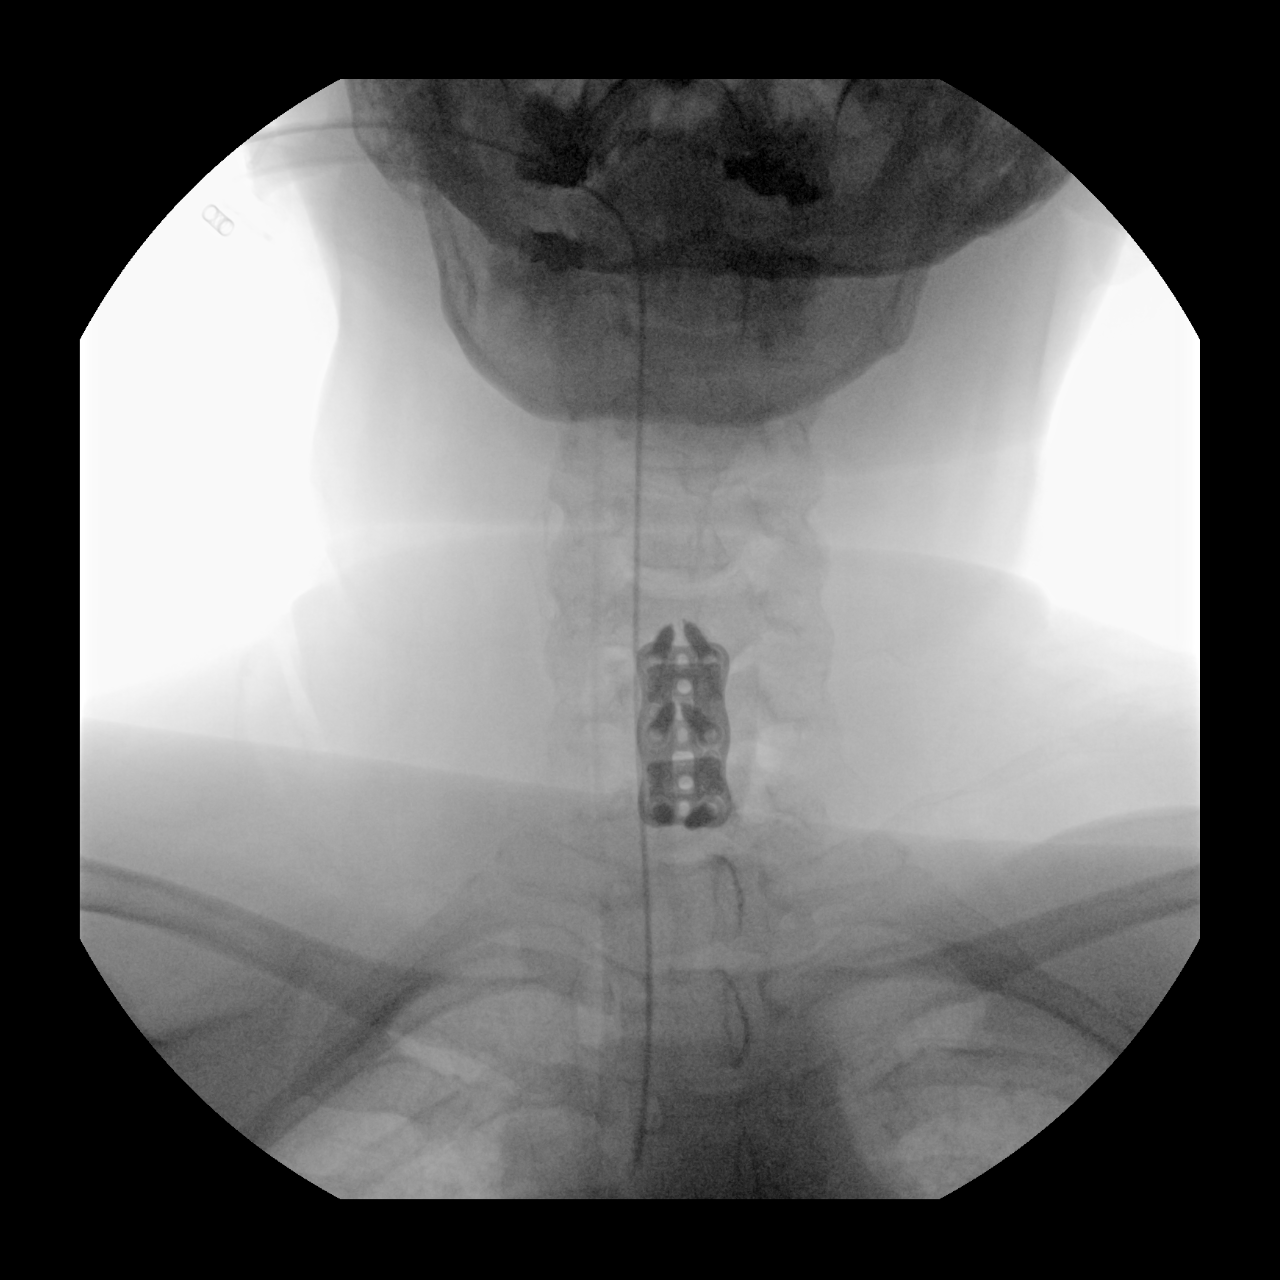

[3 of 3 positions shown; findings below may reference images not displayed]

FINDINGS: Three intraoperative spot images are submitted. First image
demonstrates anterior localization at C5-6. Next 2 intraoperative
images demonstrate changes of anterior fusion from C5-C7. The C7
vertebral body is not visualized due to overlying shoulders. No
visible complicating feature.
IMPRESSION: ACDF C5-C7.  No visible complicating feature.

## 2020-10-27 DIAGNOSIS — M9902 Segmental and somatic dysfunction of thoracic region: Secondary | ICD-10-CM | POA: Diagnosis not present

## 2020-10-27 DIAGNOSIS — M9906 Segmental and somatic dysfunction of lower extremity: Secondary | ICD-10-CM | POA: Diagnosis not present

## 2020-10-27 DIAGNOSIS — M9903 Segmental and somatic dysfunction of lumbar region: Secondary | ICD-10-CM | POA: Diagnosis not present

## 2020-10-27 DIAGNOSIS — M9901 Segmental and somatic dysfunction of cervical region: Secondary | ICD-10-CM | POA: Diagnosis not present

## 2020-10-28 DIAGNOSIS — M9906 Segmental and somatic dysfunction of lower extremity: Secondary | ICD-10-CM | POA: Diagnosis not present

## 2020-10-28 DIAGNOSIS — M9902 Segmental and somatic dysfunction of thoracic region: Secondary | ICD-10-CM | POA: Diagnosis not present

## 2020-10-28 DIAGNOSIS — M9901 Segmental and somatic dysfunction of cervical region: Secondary | ICD-10-CM | POA: Diagnosis not present

## 2020-10-28 DIAGNOSIS — M9903 Segmental and somatic dysfunction of lumbar region: Secondary | ICD-10-CM | POA: Diagnosis not present

## 2020-10-28 NOTE — Telephone Encounter (Signed)
Denied  Insurance requires patient to try and fail at least TWO formulary alternatives, Aimovig and Ajovy.   Case ID: [id number]  Appeals Phone: [phone] Fax: [phone]

## 2020-10-28 NOTE — Telephone Encounter (Signed)
I have submitted an appeal with updated information that patient has tried Ajovy and has a contraindication to aimovig, constipation. I faxed appeal to [phone].

## 2020-11-03 NOTE — Telephone Encounter (Signed)
PA for Em[employer] has been approved . Approval Dates are January 1st 2022-September 30 th 2023.  Will inform patient through [facility].

## 2020-11-03 NOTE — Telephone Encounter (Signed)
Scheduled vv with patient 05/04/21

## 2020-11-07 DIAGNOSIS — M9903 Segmental and somatic dysfunction of lumbar region: Secondary | ICD-10-CM | POA: Diagnosis not present

## 2020-11-07 DIAGNOSIS — M9901 Segmental and somatic dysfunction of cervical region: Secondary | ICD-10-CM | POA: Diagnosis not present

## 2020-11-07 DIAGNOSIS — M9906 Segmental and somatic dysfunction of lower extremity: Secondary | ICD-10-CM | POA: Diagnosis not present

## 2020-11-07 DIAGNOSIS — M9902 Segmental and somatic dysfunction of thoracic region: Secondary | ICD-10-CM | POA: Diagnosis not present

## 2020-11-10 DIAGNOSIS — M9903 Segmental and somatic dysfunction of lumbar region: Secondary | ICD-10-CM | POA: Diagnosis not present

## 2020-11-10 DIAGNOSIS — M9906 Segmental and somatic dysfunction of lower extremity: Secondary | ICD-10-CM | POA: Diagnosis not present

## 2020-11-10 DIAGNOSIS — M9902 Segmental and somatic dysfunction of thoracic region: Secondary | ICD-10-CM | POA: Diagnosis not present

## 2020-11-10 DIAGNOSIS — M9901 Segmental and somatic dysfunction of cervical region: Secondary | ICD-10-CM | POA: Diagnosis not present

## 2020-11-12 ENCOUNTER — Other Ambulatory Visit: Payer: Self-pay | Admitting: General Surgery

## 2020-11-12 DIAGNOSIS — Z1231 Encounter for screening mammogram for malignant neoplasm of breast: Secondary | ICD-10-CM

## 2020-11-12 DIAGNOSIS — Z853 Personal history of malignant neoplasm of breast: Secondary | ICD-10-CM

## 2020-11-21 DIAGNOSIS — H02831 Dermatochalasis of right upper eyelid: Secondary | ICD-10-CM | POA: Diagnosis not present

## 2020-11-21 DIAGNOSIS — H02834 Dermatochalasis of left upper eyelid: Secondary | ICD-10-CM | POA: Diagnosis not present

## 2020-12-19 ENCOUNTER — Ambulatory Visit
Admission: RE | Admit: 2020-12-19 | Discharge: 2020-12-19 | Disposition: A | Discharge status: Home / Self Care | Payer: Medicare Other | Source: Ambulatory Visit | Attending: General Surgery | Admitting: General Surgery

## 2020-12-19 ENCOUNTER — Other Ambulatory Visit: Payer: Self-pay

## 2020-12-19 DIAGNOSIS — Z20828 Contact with and (suspected) exposure to other viral communicable diseases: Secondary | ICD-10-CM | POA: Diagnosis not present

## 2020-12-19 DIAGNOSIS — Z1231 Encounter for screening mammogram for malignant neoplasm of breast: Secondary | ICD-10-CM | POA: Insufficient documentation

## 2020-12-30 DIAGNOSIS — Z853 Personal history of malignant neoplasm of breast: Secondary | ICD-10-CM | POA: Diagnosis not present

## 2021-01-11 NOTE — Progress Notes (Signed)
Cardiology Office Note Date:  01/11/2021  Patient ID:  Sonya, Lopez 1955-06-05, MRN [id number] PCP:  Steele Sizer, MD  Cardiologist:  Dr. Rockey Situ Electrophysiologist: Dr. Curt Bears    Chief Complaint:  6 mo  History of Present Illness: Sonya Lopez is a 65 y.o. female with history of DM, GERD, AFib/flutter, OSA w/CPAP, hx of orthostatic hypotension/syncope  She comes in today to be seen for Dr. Curt Bears, last seen by him June 2022, she had a couple episodes of Afib both lasting about 9hours and self terminating, she suspected triggered by claritin use, she was given PRN low dose diltiazem.  TODAY She is doing GREAT! She had used the PRN dilt twice with excellent results She is quite happy with her current AFib burden and treatment strategy. No CP, no SOB, near syncope or syncope She and her husband have had some heath barriers in the last couple years but now both back on track and motivated to get back to some exercise and have started to make some adjustments in their eating habits.  She has some dental problems with some infrequent bleeding gums, this is baseline for her, no bleeding or signs of bleeding otherwise  AFib and AAD Hx Diagnosis goes back to 2013 Amiodarone started 2013 > unclear when/why stopped Flecainide started Aug 2019 > stopped after a syncopal event 2020 (though sounds suspect of orthostasis) Amiodarone started April 2020 PVI ablatio 03/01/2019 Amiodarone stopped post ablation  Past Medical History:  Diagnosis Date   Actinic keratosis    Allergy    Anxiety    situational anxiety   Arrhythmia    A-fib/A-flutter   Atrial flutter (HCC)    Breast neoplasm, Tis (DCIS), 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  MammoSite   Calculus of kidney    Cancer ([place]) 2017   Left- DCIS   Depression    Diet-controlled type 2 diabetes mellitus ([place])    Difficult intubation 2009   [place]  had to use  'boogee" DURING GALLBLADDER SURGERY ONLY-NO PROBLEMS SINCE   Female cystocele 01/05/2011   Fibromyalgia    Gallbladder problem    GERD (gastroesophageal reflux disease)    Headache(784.0)    MIGRAINES   History of kidney stones    IBS (irritable bowel syndrome)    Joint pain    Obesity    OSA (obstructive sleep apnea) 03/27/2019   USE CPAP   OSA (obstructive sleep apnea)    Personal history of radiation therapy 2017   LEFT lumpectomy   PONV (postoperative nausea and vomiting)    h/o difficult intubation and anes had to use "boogee"   Vitamin D deficiency    Vitreous detachment of right eye    sees floaters    Past Surgical History:  Procedure Laterality Date   ABDOMINAL HYSTERECTOMY  2013   ANTERIOR AND POSTERIOR REPAIR  11/30/2011   Procedure: ANTERIOR (CYSTOCELE) AND POSTERIOR REPAIR (RECTOCELE);  Surgeon: Reece Packer, MD;  Location: [facility];  Service: Urology;  Laterality: N/A;  with cysto   ANTERIOR CERVICAL DECOMP/DISCECTOMY FUSION N/A 10/24/2019   Procedure: ANTERIOR CERVICAL DECOMPRESSION/DISCECTOMY FUSION 2 LEVELS C5-7;  Surgeon: Meade Maw, MD;  Location: [facility];  Service: Neurosurgery;  Laterality: N/A;   ATRIAL FIBRILLATION ABLATION N/A 03/01/2019   Procedure: ATRIAL FIBRILLATION ABLATION;  Surgeon: Constance Haw, MD;  Location: [facility];  Service: Cardiovascular;  Laterality: N/A;   BLADDER SUSPENSION  11/30/2011  Procedure: [place] PROCEDURE;  Surgeon: Reece Packer, MD;  Location: [facility];  Service: Urology;  Laterality: N/A;  graft 10x6   BREAST BIOPSY Right 2007   negative core   BREAST BIOPSY Left 09/16/2015   DCIS   BREAST LUMPECTOMY Left 09/30/2015   DCIS clear margins and rad tx/HIGH GRADE DUCTAL CARCINOMA IN SITU, COMEDO TYPE   BREAST LUMPECTOMY WITH NEEDLE LOCALIZATION Left 09/30/2015   DCIS excision, MammoSite radiation;  Surgeon: Robert Bellow, MD;  Location: [facility];  Service: General;  Laterality: Left;    CATARACT EXTRACTION Left 01/2019   CESAREAN SECTION     x 1   CHOLECYSTECTOMY  2005   COLONOSCOPY  2009   CYSTOSCOPY  11/30/2011   Procedure: CYSTOSCOPY;  Surgeon: Reece Packer, MD;  Location: [facility];  Service: Urology;;   [place] OF UTERUS  05/05/2007   GALLBLADDER SURGERY  2004   TONSILLECTOMY  1961   VAGINAL HYSTERECTOMY  11/30/2011   Procedure: HYSTERECTOMY VAGINAL;  Surgeon: Emily Filbert, MD;  Location: [facility];  Service: Gynecology;  Laterality: N/A;    Current Outpatient Medications  Medication Sig Dispense Refill   acetaminophen (TYLENOL) 500 MG tablet Take 2 tablets (1,000 mg total) by mouth every 8 (eight) hours. OTC  0   acetaminophen-codeine (TYLENOL #3) 300-30 MG tablet TAKE ONE TABLET BY MOUTH EVERY 4 HOURS AS NEEDED FOR MODERATE OR SEVERE PAIN 20 tablet 0   apixaban (ELIQUIS) 5 MG TABS tablet Take 1 tablet (5 mg total) by mouth 2 (two) times daily. 180 tablet 3   Cholecalciferol (PA VITAMIN D-3 GUMMY PO) Takes 2 gummies daily     diltiazem (CARDIZEM) 30 MG tablet Take 1 tablet (30 mg total) by mouth 4 (four) times daily as needed (atrial fibrillation or elevated heart rates). 30 tablet 2   DULoxetine (CYMBALTA) 20 MG capsule Take 2 capsules (40 mg total) by mouth daily. 180 capsule 1   ELDERBERRY PO Take by mouth.     FINACEA 15 % FOAM Apply 1 application topically daily as needed (rosacea).      gabapentin (NEURONTIN) 300 MG capsule Take 2 capsules (600 mg total) by mouth at bedtime. 180 capsule 1   Galcanezumab-gnlm (EMGALITY) 120 MG/ML SOAJ Inject 120 mg into the skin every 30 (thirty) days. 1.12 mL 11   magnesium oxide (MAG-OX) 400 MG tablet Take 400 mg by mouth every morning.      methocarbamol (ROBAXIN) 500 MG tablet Take 1 tablet (500 mg total) by mouth every 6 (six) hours as needed for muscle spasms. 90 tablet 0   metoprolol succinate (TOPROL-XL) 25 MG 24 hr tablet TAKE ONE TABLET (25 MG) BY MOUTH EVERY DAY 90 tablet 3   omeprazole (PRILOSEC) 20  MG capsule Take 1 capsule (20 mg total) by mouth daily. 90 capsule 1   Probiotic Product (MISC INTESTINAL FLORA REGULAT) CAPS Take by mouth.     Probiotic Product (PROBIOTIC PO) Take 1 capsule by mouth daily.      rizatriptan (MAXALT-MLT) 10 MG disintegrating tablet Take 1 tablet (10 mg total) by mouth as needed for migraine. May repeat in 2 hours if needed 27 tablet 4   trolamine salicylate (ASPERCREME) 10 % cream Apply topically.     No current facility-administered medications for this visit.    Allergies:   Adhesive [tape], Claritin [loratadine], Macrodantin [nitrofurantoin], Metronidazole, Nitrofurantoin macrocrystal, Sulfamethoxazole-trimethoprim, Cephalexin, Clindamycin hcl, Doxycycline, and Erythromycin   Social History:  The patient  reports that she  has never smoked. She has never used smokeless tobacco. She reports that she does not currently use alcohol. She reports that she does not use drugs.   Family History:  [name] family history includes Asthma in her daughter, daughter, and daughter; Colitis in her daughter; Depression in her father; Diabetes in her brother and mother; Fibromyalgia in her daughter; Hearing loss in her mother; Heart disease in her father and paternal grandfather; Heart failure in her father; Hypertension in her brother and mother; Interstitial cystitis in her daughter; Kidney disease in her mother; Migraines in her daughter; Obesity in her mother; Sleep apnea in her mother; Stroke in her father and mother; Thyroid cancer (age of onset: 51) in her daughter.  ROS:  Please see the history of present illness.    All other systems are reviewed and otherwise negative.   PHYSICAL EXAM:  VS:  There were no vitals taken for this visit. BMI: There is no height or weight on file to calculate BMI. Well nourished, well developed, in no acute distress HEENT: normocephalic, atraumatic Neck: no JVD, carotid bruits or masses Cardiac:  RRR; no significant murmurs, no  rubs, or gallops Lungs:  CTA b/l, no wheezing, rhonchi or rales Abd: soft, nontender MS: no deformity or atrophy Ext:  no edema Skin: warm and dry, no rash Neuro:  No gross deficits appreciated Psych: euthymic mood, full affect    EKG:  not done today   03/01/2019: EPS/ablation CONCLUSIONS: 1. Sinus rhythm upon presentation.   2. Successful electrical isolation and anatomical encircling of all four pulmonary veins with radiofrequency current. 3. No inducible arrhythmias following ablation both on and off of dobutamine  4. No early apparent complications  EST 8/37/2902 Final impression Normal exercise treadmill study Good exercise tolerance Target heart rate achieved Appropriate recovery after 3 minutes   04/17/2011: TTE LVEF >55%  Recent Labs: 04/17/2020: Platelets 330 07/01/2020: ALT 14; BUN 17; Creatinine, Ser 0.92; Hemoglobin 13.0; Potassium 4.6; Sodium 141; TSH 1.860  07/01/2020: Cholesterol, Total 180; HDL 49; LDL Chol Calc (NIH) 115; Triglycerides 87   CrCl cannot be calculated (Patient's most recent lab result is older than the maximum 21 days allowed.).   Wt Readings from Last 3 Encounters:  09/10/20 228 lb (103.4 kg)  07/25/20 235 lb (106.6 kg)  07/01/20 230 lb (104.3 kg)     Other studies reviewed: Additional studies/records reviewed today include: summarized above  ASSESSMENT AND PLAN:  Paroxysmal Afib CHA2DS2Vasc is 2, on Eliquis, appropriately dosed low burden by symptoms  She sees her PMD every 24mo with labs We discussed general cardiac health, weight loss and exercise strategies  Disposition: F/u with [name] in a year, sooner if needed  Current medicines are reviewed at length with the patient today.  The patient did not have any concerns regarding medicines.  Venetia Night, PA-C 01/11/2021 12:35 PM     [address] [place] [address] [phone] (office)  [phone] (fax)

## 2021-01-13 ENCOUNTER — Other Ambulatory Visit: Payer: Self-pay

## 2021-01-13 ENCOUNTER — Ambulatory Visit (INDEPENDENT_AMBULATORY_CARE_PROVIDER_SITE_OTHER): Payer: Medicare Other | Admitting: Physician Assistant

## 2021-01-13 ENCOUNTER — Encounter: Payer: Self-pay | Admitting: Physician Assistant

## 2021-01-13 VITALS — BP 112/70 | HR 66 | Ht 66.0 in | Wt 227.6 lb

## 2021-01-13 DIAGNOSIS — I480 Paroxysmal atrial fibrillation: Secondary | ICD-10-CM | POA: Diagnosis not present

## 2021-01-13 DIAGNOSIS — I4892 Unspecified atrial flutter: Secondary | ICD-10-CM

## 2021-01-13 MED ORDER — METOPROLOL SUCCINATE ER 25 MG PO TB24: ORAL_TABLET | ORAL | 3 refills | Status: DC

## 2021-01-13 MED ORDER — APIXABAN 5 MG PO TABS: 5.0000 mg | ORAL_TABLET | Freq: Two times a day (BID) | ORAL | 3 refills | Status: DC

## 2021-01-13 NOTE — Patient Instructions (Signed)

## 2021-01-31 ENCOUNTER — Emergency Department: Payer: Medicare Other

## 2021-01-31 ENCOUNTER — Encounter: Payer: Self-pay | Admitting: Internal Medicine

## 2021-01-31 ENCOUNTER — Other Ambulatory Visit: Payer: Self-pay

## 2021-01-31 ENCOUNTER — Inpatient Hospital Stay
Admission: EM | Admit: 2021-01-31 | Discharge: 2021-02-02 | DRG: 871 | Disposition: A | Discharge status: Home / Self Care | Payer: Medicare Other | Attending: Internal Medicine | Admitting: Internal Medicine

## 2021-01-31 DIAGNOSIS — M797 Fibromyalgia: Secondary | ICD-10-CM | POA: Diagnosis present

## 2021-01-31 DIAGNOSIS — J9601 Acute respiratory failure with hypoxia: Secondary | ICD-10-CM | POA: Diagnosis not present

## 2021-01-31 DIAGNOSIS — Z818 Family history of other mental and behavioral disorders: Secondary | ICD-10-CM

## 2021-01-31 DIAGNOSIS — Z881 Allergy status to other antibiotic agents status: Secondary | ICD-10-CM | POA: Diagnosis not present

## 2021-01-31 DIAGNOSIS — J069 Acute upper respiratory infection, unspecified: Secondary | ICD-10-CM

## 2021-01-31 DIAGNOSIS — I959 Hypotension, unspecified: Secondary | ICD-10-CM | POA: Diagnosis present

## 2021-01-31 DIAGNOSIS — E860 Dehydration: Secondary | ICD-10-CM | POA: Diagnosis present

## 2021-01-31 DIAGNOSIS — K589 Irritable bowel syndrome without diarrhea: Secondary | ICD-10-CM | POA: Diagnosis present

## 2021-01-31 DIAGNOSIS — K219 Gastro-esophageal reflux disease without esophagitis: Secondary | ICD-10-CM | POA: Diagnosis present

## 2021-01-31 DIAGNOSIS — Z87442 Personal history of urinary calculi: Secondary | ICD-10-CM | POA: Diagnosis not present

## 2021-01-31 DIAGNOSIS — G43009 Migraine without aura, not intractable, without status migrainosus: Secondary | ICD-10-CM | POA: Diagnosis not present

## 2021-01-31 DIAGNOSIS — R059 Cough, unspecified: Secondary | ICD-10-CM | POA: Diagnosis not present

## 2021-01-31 DIAGNOSIS — J9811 Atelectasis: Secondary | ICD-10-CM | POA: Diagnosis not present

## 2021-01-31 DIAGNOSIS — Z90710 Acquired absence of both cervix and uterus: Secondary | ICD-10-CM

## 2021-01-31 DIAGNOSIS — Z8249 Family history of ischemic heart disease and other diseases of the circulatory system: Secondary | ICD-10-CM

## 2021-01-31 DIAGNOSIS — I10 Essential (primary) hypertension: Secondary | ICD-10-CM | POA: Diagnosis present

## 2021-01-31 DIAGNOSIS — Z7901 Long term (current) use of anticoagulants: Secondary | ICD-10-CM | POA: Diagnosis not present

## 2021-01-31 DIAGNOSIS — G4733 Obstructive sleep apnea (adult) (pediatric): Secondary | ICD-10-CM | POA: Diagnosis not present

## 2021-01-31 DIAGNOSIS — Z91048 Other nonmedicinal substance allergy status: Secondary | ICD-10-CM

## 2021-01-31 DIAGNOSIS — R55 Syncope and collapse: Secondary | ICD-10-CM | POA: Diagnosis not present

## 2021-01-31 DIAGNOSIS — Z841 Family history of disorders of kidney and ureter: Secondary | ICD-10-CM

## 2021-01-31 DIAGNOSIS — E669 Obesity, unspecified: Secondary | ICD-10-CM | POA: Diagnosis present

## 2021-01-31 DIAGNOSIS — R6510 Systemic inflammatory response syndrome (SIRS) of non-infectious origin without acute organ dysfunction: Secondary | ICD-10-CM | POA: Diagnosis not present

## 2021-01-31 DIAGNOSIS — Z833 Family history of diabetes mellitus: Secondary | ICD-10-CM

## 2021-01-31 DIAGNOSIS — I480 Paroxysmal atrial fibrillation: Secondary | ICD-10-CM | POA: Diagnosis present

## 2021-01-31 DIAGNOSIS — Z6839 Body mass index (BMI) 39.0-39.9, adult: Secondary | ICD-10-CM | POA: Diagnosis not present

## 2021-01-31 DIAGNOSIS — U071 COVID-19: Secondary | ICD-10-CM | POA: Diagnosis not present

## 2021-01-31 DIAGNOSIS — E785 Hyperlipidemia, unspecified: Secondary | ICD-10-CM | POA: Diagnosis not present

## 2021-01-31 DIAGNOSIS — F320 Major depressive disorder, single episode, mild: Secondary | ICD-10-CM | POA: Diagnosis present

## 2021-01-31 DIAGNOSIS — Z853 Personal history of malignant neoplasm of breast: Secondary | ICD-10-CM | POA: Diagnosis not present

## 2021-01-31 DIAGNOSIS — S0990XA Unspecified injury of head, initial encounter: Secondary | ICD-10-CM | POA: Diagnosis not present

## 2021-01-31 DIAGNOSIS — Z981 Arthrodesis status: Secondary | ICD-10-CM

## 2021-01-31 DIAGNOSIS — N179 Acute kidney failure, unspecified: Secondary | ICD-10-CM | POA: Diagnosis not present

## 2021-01-31 DIAGNOSIS — E119 Type 2 diabetes mellitus without complications: Secondary | ICD-10-CM | POA: Diagnosis not present

## 2021-01-31 DIAGNOSIS — R0902 Hypoxemia: Secondary | ICD-10-CM | POA: Diagnosis not present

## 2021-01-31 DIAGNOSIS — A4189 Other specified sepsis: Principal | ICD-10-CM | POA: Diagnosis present

## 2021-01-31 DIAGNOSIS — S199XXA Unspecified injury of neck, initial encounter: Secondary | ICD-10-CM | POA: Diagnosis not present

## 2021-01-31 DIAGNOSIS — R4020 Unspecified coma: Secondary | ICD-10-CM | POA: Diagnosis not present

## 2021-01-31 DIAGNOSIS — Z743 Need for continuous supervision: Secondary | ICD-10-CM | POA: Diagnosis not present

## 2021-01-31 DIAGNOSIS — Z888 Allergy status to other drugs, medicaments and biological substances status: Secondary | ICD-10-CM

## 2021-01-31 LAB — CBC WITH DIFFERENTIAL/PLATELET
Abs Immature Granulocytes: 0.07 10*3/uL (ref 0.00–0.07)
Basophils Absolute: 0 10*3/uL (ref 0.0–0.1)
Basophils Relative: 0 %
Eosinophils Absolute: 0 10*3/uL (ref 0.0–0.5)
Eosinophils Relative: 0 %
HCT: 35.6 % — ABNORMAL LOW (ref 36.0–46.0)
Hemoglobin: 11.5 g/dL — ABNORMAL LOW (ref 12.0–15.0)
Immature Granulocytes: 1 %
Lymphocytes Relative: 6 %
Lymphs Abs: 0.7 10*3/uL (ref 0.7–4.0)
MCH: 30.4 pg (ref 26.0–34.0)
MCHC: 32.3 g/dL (ref 30.0–36.0)
MCV: 94.2 fL (ref 80.0–100.0)
Monocytes Absolute: 0.7 10*3/uL (ref 0.1–1.0)
Monocytes Relative: 6 %
Neutro Abs: 10.5 10*3/uL — ABNORMAL HIGH (ref 1.7–7.7)
Neutrophils Relative %: 87 %
Platelets: 230 10*3/uL (ref 150–400)
RBC: 3.78 MIL/uL — ABNORMAL LOW (ref 3.87–5.11)
RDW: 15 % (ref 11.5–15.5)
WBC: 12.1 10*3/uL — ABNORMAL HIGH (ref 4.0–10.5)
nRBC: 0 % (ref 0.0–0.2)

## 2021-01-31 LAB — URINALYSIS, COMPLETE (UACMP) WITH MICROSCOPIC
Bilirubin Urine: NEGATIVE
Glucose, UA: NEGATIVE mg/dL
Hgb urine dipstick: NEGATIVE
Ketones, ur: NEGATIVE mg/dL
Leukocytes,Ua: NEGATIVE
Nitrite: NEGATIVE
Protein, ur: NEGATIVE mg/dL
Specific Gravity, Urine: 1.023 (ref 1.005–1.030)
pH: 5 (ref 5.0–8.0)

## 2021-01-31 LAB — PROCALCITONIN: Procalcitonin: 0.11 ng/mL

## 2021-01-31 LAB — COMPREHENSIVE METABOLIC PANEL
ALT: 15 U/L (ref 0–44)
AST: 19 U/L (ref 15–41)
Albumin: 3.1 g/dL — ABNORMAL LOW (ref 3.5–5.0)
Alkaline Phosphatase: 117 U/L (ref 38–126)
Anion gap: 6 (ref 5–15)
BUN: 24 mg/dL — ABNORMAL HIGH (ref 8–23)
CO2: 26 mmol/L (ref 22–32)
Calcium: 8.1 mg/dL — ABNORMAL LOW (ref 8.9–10.3)
Chloride: 105 mmol/L (ref 98–111)
Creatinine, Ser: 1.3 mg/dL — ABNORMAL HIGH (ref 0.44–1.00)
GFR, Estimated: 45 mL/min — ABNORMAL LOW (ref >60)
Glucose, Bld: 107 mg/dL — ABNORMAL HIGH (ref 70–99)
Potassium: 3.8 mmol/L (ref 3.5–5.1)
Sodium: 137 mmol/L (ref 135–145)
Total Bilirubin: 0.4 mg/dL (ref 0.3–1.2)
Total Protein: 6 g/dL — ABNORMAL LOW (ref 6.5–8.1)

## 2021-01-31 LAB — HIV ANTIBODY (ROUTINE TESTING W REFLEX): HIV Screen 4th Generation wRfx: NONREACTIVE

## 2021-01-31 LAB — TROPONIN I (HIGH SENSITIVITY): Troponin I (High Sensitivity): 5 ng/L (ref <18)

## 2021-01-31 LAB — LACTIC ACID, PLASMA: Lactic Acid, Venous: 1.4 mmol/L (ref 0.5–1.9)

## 2021-01-31 LAB — C-REACTIVE PROTEIN: CRP: 13.1 mg/dL — ABNORMAL HIGH (ref <1.0)

## 2021-01-31 LAB — RESP PANEL BY RT-PCR (FLU A&B, COVID) ARPGX2
Influenza A by PCR: NEGATIVE
Influenza B by PCR: NEGATIVE
SARS Coronavirus 2 by RT PCR: POSITIVE — AB

## 2021-01-31 LAB — CBG MONITORING, ED: Glucose-Capillary: 113 mg/dL — ABNORMAL HIGH (ref 70–99)

## 2021-01-31 LAB — MAGNESIUM: Magnesium: 1.8 mg/dL (ref 1.7–2.4)

## 2021-01-31 MED ORDER — PANTOPRAZOLE SODIUM 40 MG PO TBEC
40.0000 mg | DELAYED_RELEASE_TABLET | Freq: Every day | ORAL | Status: DC
  Administered 2021-01-31 – 2021-02-02 (×3): 40 mg via ORAL
  Filled 2021-01-31 (×3): qty 1

## 2021-01-31 MED ORDER — DM-GUAIFENESIN ER 30-600 MG PO TB12
1.0000 | ORAL_TABLET | Freq: Two times a day (BID) | ORAL | Status: DC | PRN
  Administered 2021-02-01: 1 via ORAL
  Filled 2021-01-31: qty 1

## 2021-01-31 MED ORDER — RIZATRIPTAN BENZOATE 10 MG PO TBDP: 10.0000 mg | ORAL_TABLET | ORAL | Status: DC | PRN

## 2021-01-31 MED ORDER — ONDANSETRON HCL 4 MG/2ML IJ SOLN
4.0000 mg | Freq: Three times a day (TID) | INTRAMUSCULAR | Status: DC | PRN
  Administered 2021-01-31: 17:00:00 4 mg via INTRAVENOUS
  Filled 2021-01-31: qty 2

## 2021-01-31 MED ORDER — APIXABAN 5 MG PO TABS
5.0000 mg | ORAL_TABLET | Freq: Two times a day (BID) | ORAL | Status: DC
  Administered 2021-01-31 – 2021-02-02 (×5): 5 mg via ORAL
  Filled 2021-01-31 (×5): qty 1

## 2021-01-31 MED ORDER — ALBUTEROL SULFATE HFA 108 (90 BASE) MCG/ACT IN AERS
2.0000 | INHALATION_SPRAY | RESPIRATORY_TRACT | Status: DC | PRN
  Filled 2021-01-31: qty 6.7

## 2021-01-31 MED ORDER — SODIUM CHLORIDE 0.9 % IV SOLN
100.0000 mg | Freq: Every day | INTRAVENOUS | Status: DC
  Administered 2021-02-01 – 2021-02-02 (×2): 100 mg via INTRAVENOUS
  Filled 2021-01-31 (×2): qty 100

## 2021-01-31 MED ORDER — PHENOL 1.4 % MT LIQD
1.0000 | OROMUCOSAL | Status: DC | PRN
  Administered 2021-01-31: 19:00:00 1 via OROMUCOSAL
  Filled 2021-01-31 (×2): qty 177

## 2021-01-31 MED ORDER — IPRATROPIUM BROMIDE HFA 17 MCG/ACT IN AERS
2.0000 | INHALATION_SPRAY | RESPIRATORY_TRACT | Status: DC
  Administered 2021-01-31 – 2021-02-02 (×13): 2 via RESPIRATORY_TRACT
  Filled 2021-01-31: qty 12.9

## 2021-01-31 MED ORDER — SUMATRIPTAN SUCCINATE 50 MG PO TABS
100.0000 mg | ORAL_TABLET | ORAL | Status: DC | PRN
  Filled 2021-01-31: qty 2

## 2021-01-31 MED ORDER — LACTATED RINGERS IV BOLUS
1000.0000 mL | Freq: Once | INTRAVENOUS | Status: AC
  Administered 2021-01-31: 1000 mL via INTRAVENOUS

## 2021-01-31 MED ORDER — METHYLPREDNISOLONE SODIUM SUCC 125 MG IJ SOLR
60.0000 mg | Freq: Two times a day (BID) | INTRAMUSCULAR | Status: DC
  Administered 2021-01-31 – 2021-02-01 (×3): 60 mg via INTRAVENOUS
  Filled 2021-01-31 (×3): qty 2

## 2021-01-31 MED ORDER — ONDANSETRON HCL 4 MG/2ML IJ SOLN
4.0000 mg | Freq: Once | INTRAMUSCULAR | Status: AC
  Administered 2021-01-31: 4 mg via INTRAVENOUS
  Filled 2021-01-31: qty 2

## 2021-01-31 MED ORDER — METHOCARBAMOL 500 MG PO TABS
500.0000 mg | ORAL_TABLET | Freq: Four times a day (QID) | ORAL | Status: DC | PRN
  Filled 2021-01-31: qty 1

## 2021-01-31 MED ORDER — SODIUM CHLORIDE 0.9 % IV SOLN
2.0000 g | Freq: Once | INTRAVENOUS | Status: AC
  Administered 2021-01-31: 2 g via INTRAVENOUS
  Filled 2021-01-31: qty 2

## 2021-01-31 MED ORDER — GABAPENTIN 300 MG PO CAPS
600.0000 mg | ORAL_CAPSULE | Freq: Every day | ORAL | Status: DC
  Administered 2021-01-31 – 2021-02-01 (×2): 600 mg via ORAL
  Filled 2021-01-31 (×2): qty 2

## 2021-01-31 MED ORDER — VITAMIN D 25 MCG (1000 UNIT) PO TABS
500.0000 [IU] | ORAL_TABLET | Freq: Every day | ORAL | Status: DC
  Administered 2021-01-31 – 2021-02-02 (×3): 500 [IU] via ORAL
  Filled 2021-01-31 (×3): qty 1

## 2021-01-31 MED ORDER — LACTATED RINGERS IV BOLUS
1500.0000 mL | Freq: Once | INTRAVENOUS | Status: AC
  Administered 2021-01-31: 1500 mL via INTRAVENOUS

## 2021-01-31 MED ORDER — LACTATED RINGERS IV SOLN: INTRAVENOUS | Status: AC

## 2021-01-31 MED ORDER — VANCOMYCIN HCL IN DEXTROSE 1-5 GM/200ML-% IV SOLN
1000.0000 mg | Freq: Once | INTRAVENOUS | Status: AC
  Administered 2021-01-31: 1000 mg via INTRAVENOUS
  Filled 2021-01-31: qty 200

## 2021-01-31 MED ORDER — ACETAMINOPHEN 325 MG PO TABS: 650.0000 mg | ORAL_TABLET | Freq: Four times a day (QID) | ORAL | Status: DC | PRN

## 2021-01-31 MED ORDER — IOHEXOL 350 MG/ML SOLN
75.0000 mL | Freq: Once | INTRAVENOUS | Status: AC | PRN
  Administered 2021-01-31: 75 mL via INTRAVENOUS

## 2021-01-31 MED ORDER — MAGNESIUM OXIDE 400 MG PO TABS
400.0000 mg | ORAL_TABLET | ORAL | Status: DC
  Administered 2021-01-31 – 2021-02-02 (×3): 400 mg via ORAL
  Filled 2021-01-31 (×5): qty 1

## 2021-01-31 MED ORDER — SACCHAROMYCES BOULARDII 250 MG PO CAPS
ORAL_CAPSULE | Freq: Every day | ORAL | Status: DC
  Administered 2021-01-31 – 2021-02-02 (×3): 250 mg via ORAL
  Filled 2021-01-31 (×3): qty 1

## 2021-01-31 MED ORDER — SODIUM CHLORIDE 0.9 % IV SOLN
200.0000 mg | Freq: Once | INTRAVENOUS | Status: AC
  Administered 2021-01-31: 200 mg via INTRAVENOUS
  Filled 2021-01-31: qty 200

## 2021-01-31 MED ORDER — DULOXETINE HCL 20 MG PO CPEP
40.0000 mg | ORAL_CAPSULE | Freq: Every day | ORAL | Status: DC
  Administered 2021-01-31 – 2021-02-02 (×3): 40 mg via ORAL
  Filled 2021-01-31 (×3): qty 2

## 2021-01-31 MED ORDER — ELDERBERRY 500 MG PO CAPS: 1.0000 | ORAL_CAPSULE | Freq: Every day | ORAL | Status: DC

## 2021-01-31 NOTE — ED Notes (Signed)
Pt taken to CT at this time.

## 2021-01-31 NOTE — ED Notes (Signed)
Breakfast tray placed at bedside.  

## 2021-01-31 NOTE — Progress Notes (Signed)
Remdesivir - Pharmacy Brief Note   O:  CXR: "Low volume chest without focal pneumonia." SpO2: 89-99% on 2 L/min via [place]   A/P:  Remdesivir 200 mg IVPB once followed by 100 mg IVPB daily x 4 days.   Renda Rolls, PharmD, [facility] 01/31/2021 6:25 AM

## 2021-01-31 NOTE — Progress Notes (Signed)
Patient arrived to unit in stable condition. 

## 2021-01-31 NOTE — Progress Notes (Signed)
Pt being admitted to room 124, Report given to [facility] Therapist, sports. Belongings sent with pt. Pt stable, transported by ED tech.

## 2021-01-31 NOTE — ED Triage Notes (Signed)
Pt arrives via [facility], states fall this evening with LOC.  Pt states that she got out of bed and fell, did not feel dizzy prior, does not think she hit her head.  C/O nausea, dizziness, lightheadedness. BP per EMS was 60/30 then 92/72 in route with 500 NS bolus.

## 2021-01-31 NOTE — Progress Notes (Signed)
PHARMACY -  BRIEF ANTIBIOTIC NOTE   Pharmacy has received consult(s) for Aztreonam and Vancomycin from an ED provider.  [name] profile has been reviewed for ht/wt/allergies/indication/available labs.    One time order(s) placed for Aztreonam 2 gm and Vancomycin 1 gm (no recent pt wt currently available)  Further antibiotics/pharmacy consults should be ordered by admitting physician if indicated.                       Thank you, Renda Rolls, PharmD, [facility] 01/31/2021 4:48 AM

## 2021-01-31 NOTE — ED Notes (Signed)
Patient ambulatory to bathroom with assistance of family member.

## 2021-01-31 NOTE — ED Notes (Signed)
EDP and IPMD made aware of pt rectal temp of 96.8.

## 2021-01-31 NOTE — ED Provider Notes (Addendum)
[facility] Emergency Department Provider Note ____________________________________________   Event Date/Time   First MD Initiated Contact with Patient 01/31/21 0413     (approximate)  I have reviewed the triage vital signs and the nursing notes.   HISTORY  Chief Complaint Hypotension    HPI Sonya Lopez is a 65 y.o. female with history of atrial fibrillation on Eliquis, obesity, fibromyalgia who presents to the emergency department EMS after she had a syncopal event at home.  Patient reports since Wednesday, December 28 she has not felt well and has had a cough.  Reports that she has been exposed to a family member with COVID-19.  States that she got up to go the bathroom and felt lightheaded and passed out.  No chest pain or shortness of breath.  She thinks that she may have hit her head but is not sure.  No neck or back pain.  No numbness, tingling or weakness.  No bloody stools or melena.  No diarrhea.  Did have vomiting after the syncopal event.  Received 500 mL of fluid and 2 mg of IV Zofran with EMS.  Blood sugar with EMS was normal but initial blood pressure in the 60s/30s but improved with IV hydration.  She has had 2 vaccinations against COVID-19.         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  MammoSite   Calculus of kidney    Cancer ([place]) 2017   Left- DCIS   Depression    Diet-controlled type 2 diabetes mellitus ([place])    Difficult intubation 2009   [place] had to use  'boogee" DURING GALLBLADDER SURGERY ONLY-NO PROBLEMS SINCE   Female cystocele 01/05/2011   Fibromyalgia    Gallbladder problem    GERD (gastroesophageal reflux disease)    Headache(784.0)    MIGRAINES   History of  kidney stones    IBS (irritable bowel syndrome)    Joint pain    Obesity    OSA (obstructive sleep apnea) 03/27/2019   USE CPAP   OSA (obstructive sleep apnea)    Personal history of radiation therapy 2017   LEFT lumpectomy   PONV (postoperative nausea and vomiting)    h/o difficult intubation and anes had to use "boogee"   Vitamin D deficiency    Vitreous detachment of right eye    sees floaters    Patient Active Problem List   Diagnosis Date Noted   History of diabetes mellitus, type II 09/11/2019   OSA (obstructive sleep apnea) 03/27/2019   Pain, elbow joint 02/02/2018   PAF (paroxysmal atrial fibrillation) ([place]) 09/13/2017   Trochanteric bursitis of left hip 06/24/2017   Low back pain 02/22/2017   Benign breast cyst in female, right 05/25/2016   History of left breast cancer 09/16/2015   Long-term use of high-risk medication 02/25/2015   Mild major depression ([place]) 07/29/2014   Gastric reflux 07/29/2014   Chronic interstitial cystitis 07/29/2014   Calculus of kidney 07/29/2014   Allergic rhinitis 07/29/2014   Acne erythematosa 07/29/2014   Lumbosacral radiculitis 07/29/2014   Tinnitus 07/29/2014   Mixed incontinence, urge and stress (female) (female) 08/23/2013   Hyperlipidemia 04/25/2013   H/O: hysterectomy 11/30/2011  Atrial flutter ([place]) 05/03/2011   Bladder cystocele 01/05/2011   Migraine without aura and without status migrainosus, not intractable 12/29/2010   Fibromyalgia 12/29/2010   Obesity 12/29/2010    Past Surgical History:  Procedure Laterality Date   ABDOMINAL HYSTERECTOMY  2013   ANTERIOR AND POSTERIOR REPAIR  11/30/2011   Procedure: ANTERIOR (CYSTOCELE) AND POSTERIOR REPAIR (RECTOCELE);  Surgeon: Reece Packer, MD;  Location: [facility];  Service: Urology;  Laterality: N/A;  with cysto   ANTERIOR CERVICAL DECOMP/DISCECTOMY FUSION N/A 10/24/2019   Procedure: ANTERIOR CERVICAL DECOMPRESSION/DISCECTOMY FUSION 2 LEVELS C5-7;  Surgeon: Meade Maw, MD;  Location: [facility];  Service: Neurosurgery;  Laterality: N/A;   ATRIAL FIBRILLATION ABLATION N/A 03/01/2019   Procedure: ATRIAL FIBRILLATION ABLATION;  Surgeon: Constance Haw, MD;  Location: [facility];  Service: Cardiovascular;  Laterality: N/A;   BLADDER SUSPENSION  11/30/2011   Procedure: [place] PROCEDURE;  Surgeon: Reece Packer, MD;  Location: [facility];  Service: Urology;  Laterality: N/A;  graft 10x6   BREAST BIOPSY Right 2007   negative core   BREAST BIOPSY Left 09/16/2015   DCIS   BREAST LUMPECTOMY Left 09/30/2015   DCIS clear margins and rad tx/HIGH GRADE DUCTAL CARCINOMA IN SITU, COMEDO TYPE   BREAST LUMPECTOMY WITH NEEDLE LOCALIZATION Left 09/30/2015   DCIS excision, MammoSite radiation;  Surgeon: Robert Bellow, MD;  Location: [facility];  Service: General;  Laterality: Left;   CATARACT EXTRACTION Left 01/2019   CESAREAN SECTION     x 1   CHOLECYSTECTOMY  2005   COLONOSCOPY  2009   CYSTOSCOPY  11/30/2011   Procedure: CYSTOSCOPY;  Surgeon: Reece Packer, MD;  Location: [facility];  Service: Urology;;   [place] OF UTERUS  05/05/2007   GALLBLADDER SURGERY  2004   TONSILLECTOMY  1961   VAGINAL HYSTERECTOMY  11/30/2011   Procedure: HYSTERECTOMY VAGINAL;  Surgeon: Emily Filbert, MD;  Location: [facility];  Service: Gynecology;  Laterality: N/A;    Prior to Admission medications   Medication Sig Start Date End Date Taking? Authorizing Provider  acetaminophen (TYLENOL) 500 MG tablet Take 2 tablets (1,000 mg total) by mouth every 8 (eight) hours. OTC 10/24/19  Yes Zdeb, Christine, NP  apixaban (ELIQUIS) 5 MG TABS tablet Take 1 tablet (5 mg total) by mouth 2 (two) times daily. 01/13/21  Yes Baldwin Jamaica, PA-C  Cholecalciferol (PA VITAMIN D-3 GUMMY PO) Takes 2 gummies daily   Yes [provider]  diltiazem (CARDIZEM) 30 MG tablet Take 1 tablet (30 mg total) by mouth 4 (four) times daily as needed (atrial fibrillation or elevated  heart rates). 07/25/20  Yes Camnitz, Will Hassell Done, MD  DULoxetine (CYMBALTA) 20 MG capsule Take 2 capsules (40 mg total) by mouth daily. 09/10/20  Yes Steele Sizer, MD  ELDERBERRY PO Take by mouth.   Yes [provider]  gabapentin (NEURONTIN) 300 MG capsule Take 2 capsules (600 mg total) by mouth at bedtime. 09/10/20  Yes Sowles, Drue Stager, MD  Galcanezumab-gnlm (EMGALITY) 120 MG/ML SOAJ Inject 120 mg into the skin every 30 (thirty) days. 10/22/20  Yes [name], MD  magnesium oxide (MAG-OX) 400 MG tablet Take 400 mg by mouth every morning.    Yes [provider]  methocarbamol (ROBAXIN) 500 MG tablet Take 1 tablet (500 mg total) by mouth every 6 (six) hours as needed for muscle spasms. 03/13/20  Yes Sowles, Drue Stager, MD  metoprolol succinate (TOPROL-XL) 25 MG 24 hr tablet TAKE  ONE TABLET (25 MG) BY MOUTH EVERY DAY 01/13/21  Yes Baldwin Jamaica, PA-C  omeprazole (PRILOSEC) 20 MG capsule Take 1 capsule (20 mg total) by mouth daily. 09/10/20  Yes Sowles, Drue Stager, MD  Probiotic Product (PROBIOTIC PO) Take 1 capsule by mouth daily.    Yes [provider]  trolamine salicylate (ASPERCREME) 10 % cream Apply 1 application topically as needed.   Yes [provider]  acetaminophen-codeine (TYLENOL #3) 300-30 MG tablet TAKE ONE TABLET BY MOUTH EVERY 4 HOURS AS NEEDED FOR MODERATE OR SEVERE PAIN Patient not taking: Reported on 01/31/2021 09/10/20   [name], MD  bacitracin 500 UNIT/GM ointment as needed. 11/25/20   [provider]  FINACEA 15 % FOAM Apply 1 application topically daily as needed (rosacea).  04/19/16   [provider]  rizatriptan (MAXALT-MLT) 10 MG disintegrating tablet Take 1 tablet (10 mg total) by mouth as needed for migraine. May repeat in 2 hours if needed 10/22/20   [name], MD    Allergies Adhesive [tape], Claritin [loratadine], Macrodantin [nitrofurantoin], Metronidazole, Nitrofurantoin macrocrystal,  Sulfamethoxazole-trimethoprim, Cephalexin, Clindamycin hcl, Doxycycline, and Erythromycin  Family History  Problem Relation Age of Onset   Diabetes Mother    Hearing loss Mother    Hypertension Mother    Stroke Mother    Kidney disease Mother    Sleep apnea Mother    Obesity Mother    Heart disease Father    Heart failure Father    Stroke Father    Depression Father    Heart disease Paternal Grandfather    Asthma Daughter    Asthma Daughter    Migraines Daughter    Fibromyalgia Daughter    Interstitial cystitis Daughter    Asthma Daughter    Colitis Daughter    Thyroid cancer Daughter 49   Diabetes Brother    Hypertension Brother    Breast cancer Neg Hx     Social History Social History   Tobacco Use   Smoking status: Never   Smokeless tobacco: Never  Vaping Use   Vaping Use: Never used  Substance Use Topics   Alcohol use: Not Currently    Alcohol/week: 0.0 standard drinks    Comment: Due to AF   Drug use: No    Review of Systems Constitutional: No fever. Eyes: No visual changes. ENT: No sore throat. Cardiovascular: Denies chest pain. Respiratory: Denies shortness of breath. Gastrointestinal: + nausea, vomiting.  No diarrhea. Genitourinary: Negative for dysuria. Musculoskeletal: Negative for back pain. Skin: Negative for rash. Neurological: Negative for focal weakness or numbness.   ____________________________________________   PHYSICAL EXAM:  VITAL SIGNS: ED Triage Vitals  Enc Vitals Group     BP 01/31/21 0422 (!) 72/62     Pulse Rate 01/31/21 0422 65     Resp 01/31/21 0422 20     Temp 01/31/21 0422 98 F (36.7 C)     Temp Source 01/31/21 0422 Oral     SpO2 01/31/21 0421 (!) 89 %     Weight 01/31/21 0506 241 lb 8 oz (109.5 kg)     Height 01/31/21 0506 5\' 6"  (1.676 m)     Head Circumference --      Peak Flow --      Pain Score 01/31/21 0424 0     Pain Loc --      Pain Edu? --      Excl. in [place]? --    CONSTITUTIONAL: Alert and oriented  and responds appropriately to  questions. Well-appearing; well-nourished; GCS 15 HEAD: Normocephalic; atraumatic EYES: Conjunctivae clear, PERRL, EOMI ENT: normal nose; no rhinorrhea; moist mucous membranes; pharynx without lesions noted; no dental injury; no septal hematoma NECK: Supple, no meningismus, no LAD; no midline spinal tenderness, step-off or deformity; trachea midline CARD: RRR; S1 and S2 appreciated; no murmurs, no clicks, no rubs, no gallops RESP: Normal chest excursion without splinting or tachypnea; breath sounds clear and equal bilaterally; no wheezes, no rhonchi, no rales; no hypoxia or respiratory distress CHEST:  chest wall stable, no crepitus or ecchymosis or deformity, nontender to palpation; no flail chest ABD/GI: Normal bowel sounds; non-distended; soft, non-tender, no rebound, no guarding; no ecchymosis or other lesions noted PELVIS:  stable, nontender to palpation BACK:  The back appears normal and is non-tender to palpation, there is no CVA tenderness; no midline spinal tenderness, step-off or deformity EXT: Normal ROM in all joints; non-tender to palpation; no edema; normal capillary refill; no cyanosis, no bony tenderness or bony deformity of patient's extremities, no joint effusion, compartments are soft, extremities are warm and well-perfused, no ecchymosis, no calf tenderness or calf swelling SKIN: Normal color for age and race; warm NEURO: Moves all extremities equally, no facial asymmetry, normal speech, normal sensation diffusely PSYCH: [name] mood and manner are appropriate. Grooming and personal hygiene are appropriate.  ____________________________________________   LABS (all labs ordered are listed, but only abnormal results are displayed)  Labs Reviewed  RESP PANEL BY RT-PCR (FLU A&B, COVID) ARPGX2 - Abnormal; Notable for the following components:      Result Value   SARS Coronavirus 2 by RT PCR POSITIVE (*)    All other components within normal  limits  CBC WITH DIFFERENTIAL/PLATELET - Abnormal; Notable for the following components:   WBC 12.1 (*)    RBC 3.78 (*)    Hemoglobin 11.5 (*)    HCT 35.6 (*)    Neutro Abs 10.5 (*)    All other components within normal limits  COMPREHENSIVE METABOLIC PANEL - Abnormal; Notable for the following components:   Glucose, Bld 107 (*)    BUN 24 (*)    Creatinine, Ser 1.30 (*)    Calcium 8.1 (*)    Total Protein 6.0 (*)    Albumin 3.1 (*)    GFR, Estimated 45 (*)    All other components within normal limits  CBG MONITORING, ED - Abnormal; Notable for the following components:   Glucose-Capillary 113 (*)    All other components within normal limits  URINE CULTURE  CULTURE, BLOOD (ROUTINE X 2)  CULTURE, BLOOD (ROUTINE X 2)  LACTIC ACID, PLASMA  PROCALCITONIN  MAGNESIUM  URINALYSIS, COMPLETE (UACMP) WITH MICROSCOPIC  TROPONIN I (HIGH SENSITIVITY)  TROPONIN I (HIGH SENSITIVITY)   ____________________________________________  EKG   EKG Interpretation  Date/Time:  Saturday January 31 2021 05:15:38 EST Ventricular Rate:  70 PR Interval:  154 QRS Duration: 88 QT Interval:  412 QTC Calculation: 444 R Axis:   77 Text Interpretation: Normal sinus rhythm Normal ECG When compared with ECG of 29-Mar-2019 10:31, No significant change was found Confirmed by Pryor Curia [phone]) on 01/31/2021 5:18:11 AM        ____________________________________________  RADIOLOGY Jessie Foot Charmaine Placido, personally viewed and evaluated these images (plain radiographs) as part of my medical decision making, as well as reviewing the written report by the radiologist.  ED MD interpretation: CT head and cervical spine show no acute traumatic injury.  CTA of the chest shows no infiltrate, edema or PE.  Official radiology report(s): CT HEAD WO CONTRAST (5MM)  Result Date: 01/31/2021 CLINICAL DATA:  Head and neck trauma EXAM: CT HEAD WITHOUT CONTRAST CT CERVICAL SPINE WITHOUT CONTRAST TECHNIQUE:  Multidetector CT imaging of the head and cervical spine was performed following the standard protocol without intravenous contrast. Multiplanar CT image reconstructions of the cervical spine were also generated. COMPARISON:  None. FINDINGS: CT HEAD FINDINGS Brain: No evidence of acute infarction, hemorrhage, hydrocephalus, extra-axial collection or mass lesion/mass effect. Vascular: No hyperdense vessel or unexpected calcification. Skull: Normal. Negative for fracture or focal lesion. Sinuses/Orbits: No acute finding. CT CERVICAL SPINE FINDINGS Alignment: No traumatic malalignment Skull base and vertebrae: C5-6 and C6-7 ACDF with solid arthrodesis Soft tissues and spinal canal: No prevertebral fluid or swelling. No visible canal hematoma. Disc levels: C5-6 and C6-7 ACDF with solid arthrodesis. No significant adjacent segment degeneration. Upper chest: No acute finding IMPRESSION: No evidence of acute intracranial or cervical spine injury. Electronically Signed   By: Jorje Guild M.D.   On: 01/31/2021 05:04   CT Cervical Spine Wo Contrast  Result Date: 01/31/2021 CLINICAL DATA:  Head and neck trauma EXAM: CT HEAD WITHOUT CONTRAST CT CERVICAL SPINE WITHOUT CONTRAST TECHNIQUE: Multidetector CT imaging of the head and cervical spine was performed following the standard protocol without intravenous contrast. Multiplanar CT image reconstructions of the cervical spine were also generated. COMPARISON:  None. FINDINGS: CT HEAD FINDINGS Brain: No evidence of acute infarction, hemorrhage, hydrocephalus, extra-axial collection or mass lesion/mass effect. Vascular: No hyperdense vessel or unexpected calcification. Skull: Normal. Negative for fracture or focal lesion. Sinuses/Orbits: No acute finding. CT CERVICAL SPINE FINDINGS Alignment: No traumatic malalignment Skull base and vertebrae: C5-6 and C6-7 ACDF with solid arthrodesis Soft tissues and spinal canal: No prevertebral fluid or swelling. No visible canal hematoma.  Disc levels: C5-6 and C6-7 ACDF with solid arthrodesis. No significant adjacent segment degeneration. Upper chest: No acute finding IMPRESSION: No evidence of acute intracranial or cervical spine injury. Electronically Signed   By: Jorje Guild M.D.   On: 01/31/2021 05:04   DG Chest Portable 1 View  Result Date: 01/31/2021 CLINICAL DATA:  Cough.  COVID exposure EXAM: PORTABLE CHEST 1 VIEW COMPARISON:  07/28/2018 FINDINGS: Low volume chest which accentuates lung markings. There is no edema, consolidation, effusion, or pneumothorax. Normal heart size and mediastinal contours. ACDF hardware. IMPRESSION: Low volume chest without focal pneumonia. Electronically Signed   By: Jorje Guild M.D.   On: 01/31/2021 04:47    ____________________________________________   PROCEDURES  Procedure(s) performed (including Critical Care):  Procedures  CRITICAL CARE Performed by: Cyril Mourning Trang Bouse   Total critical care time: 65 minutes  Critical care time was exclusive of separately billable procedures and treating other patients.  Critical care was necessary to treat or prevent imminent or life-threatening deterioration.  Critical care was time spent personally by me on the following activities: development of treatment plan with patient and/or surrogate as well as nursing, discussions with consultants, evaluation of patient's response to treatment, examination of patient, obtaining history from patient or surrogate, ordering and performing treatments and interventions, ordering and review of laboratory studies, ordering and review of radiographic studies, pulse oximetry and re-evaluation of patient's condition.  ____________________________________________   INITIAL IMPRESSION / ASSESSMENT AND PLAN / ED COURSE  As part of my medical decision making, I reviewed the following data within the [place] History obtained from family, Nursing notes reviewed and incorporated, Labs reviewed  , EKG interpreted , Old EKG reviewed, Old chart  reviewed, Radiograph reviewed , Discussed with admitting physician , CTs reviewed, and Notes from prior ED visits         Patient here after a syncopal event.  Found to be hypotensive with EMS.  This has improved with IV fluids.  Does report several days of cough and a recent COVID-19 exposure.  Differential includes sepsis, pneumonia, COVID, influenza, dehydration, orthostasis, ACS, arrhythmia, PE, dissection, anemia, electrolyte derangement, hypoglycemia, intracranial hemorrhage.  Patient found to be hypoxic here with sats of 89% on room air.  Does not wear oxygen chronically.  Placed on 2 L nasal cannula.  Will activate a code sepsis given she is hypotensive here.  Will give broad-spectrum antibiotics and 30 mL/kg IV fluid bolus.  Given she is on Eliquis and may have hit her head, will obtain CT of her head and cervical spine.  Anticipate admission.  Labs, cultures, chest x-ray, urine, COVID and flu swabs pending.  ED PROGRESS  CT head and cervical spine show no acute traumatic injury.  Chest x-ray clear.  Given her new oxygen requirement, hypoxia here, will obtain CTA of the chest for further evaluation for her hypoxia.  No significant electrolyte derangement or anemia.  She does have a mild AKI.  She is getting IV hydration.  6:10 AM  She has positive for COVID-19.  Blood pressures have been fluid responsive.  Will give IV remdesivir.  Will discuss with medicine for admission.  6:22 AM  Pt's CTA shows no PE, infiltrate or edema.  Patient doing well on 2 L nasal cannula.  Suspect hypoxia is due to her COVID-19 diagnosis.  6:24 AM Discussed patient's case with hospitalist, Dr. Hal Hope.  I have recommended admission and patient (and family if present) agree with this plan. Admitting physician will place admission orders.   I reviewed all nursing notes, vitals, pertinent previous records and reviewed/interpreted all EKGs, lab and urine results,  imaging (as available).  ____________________________________________   FINAL CLINICAL IMPRESSION(S) / ED DIAGNOSES  Final diagnoses:  Hypotension, unspecified hypotension type  Acute respiratory failure with hypoxia (HCC)  Syncope, unspecified syncope type  AKI (acute kidney injury) ([place])  COVID-19     ED Discharge Orders     None       *Please note:  Sonya Lopez was evaluated in Emergency Department on 01/31/2021 for the symptoms described in the history of present illness. She was evaluated in the context of the global COVID-19 pandemic, which necessitated consideration that the patient might be at risk for infection with the SARS-CoV-2 virus that causes COVID-19. Institutional protocols and algorithms that pertain to the evaluation of patients at risk for COVID-19 are in a state of rapid change based on information released by regulatory bodies including the CDC and federal and state organizations. These policies and algorithms were followed during the patient's care in the ED.  Some ED evaluations and interventions may be delayed as a result of limited staffing during and the pandemic.*   Note:  This document was prepared using Dragon voice recognition software and may include unintentional dictation errors.    Marshal Schrecengost, Delice Bison, DO 01/31/21 0626    Pa Tennant, Delice Bison, DO 01/31/21 [phone]

## 2021-01-31 NOTE — H&P (Signed)
History and Physical    SAKAI HEINLE OZH:[id number] DOB: 1955/07/25 DOA: 01/31/2021  Referring MD/NP/PA:   PCP: Steele Sizer, MD   Patient coming from:  The patient is coming from home.  At baseline, pt is independent for most of ADL.        Chief Complaint: Cough, syncope  HPI: Sonya Lopez is a 65 y.o. female with medical history significant of hypertension, diet-controlled diabetes mellitus, GERD, depression, anxiety, atrial fibrillation on Eliquis, OSA on CPAP, IBS, kidney stone, fibromyalgia, breast cancer (s/p left lumpectomy), migraine headache, who presents with cough, syncope.  Patient states that she has been feeling sick since Wednesday 12/28.  She has a generalized weakness, cough, sore throat, mild fever and chills. No chest pain.  She states that she got up to go the bathroom and felt lightheaded and passed out last night.  No unilateral numbness or tinglings in extremities.  No facial droop or slurred speech. She thinks that she may have hit her head but is not sure. She has nausea, denies vomiting, diarrhea or abdominal pain to me.  Per report, patient had hypotension with blood pressure 60s/30s.  Patient was given 500 cc normal saline by EMS.  Blood pressure improved.  Patient was also found to have oxygen desaturating to 89% on room air.  2 L oxygen was started.  ED Course: pt was found to have positive COVID PCR, AKI with creatinine 1.30 and a BUN 24 (0.92 on 07/01/2020), temperature 96.8, blood pressure 112/59, heart rate 79, RR 15.  Chest x-ray negative for infiltration.  CT of head and CT of C-spine is negative for acute issues.  CT angiograms negative for PE.  Patient is admitted to [place] bed as inpatient  Review of Systems:   General: has subjective fevers, chills, no body weight gain, has poor appetite, has fatigue HEENT: no blurry vision, hearing changes or sore throat Respiratory: no dyspnea, has coughing, no wheezing CV: no chest pain, no  palpitations GI: has nausea, no vomiting, abdominal pain, diarrhea, constipation GU: no dysuria, burning on urination, increased urinary frequency, hematuria  Ext: no leg edema Neuro: no unilateral weakness, numbness, or tingling, no vision change or hearing loss. Has syncope Skin: no rash, no skin tear. MSK: No muscle spasm, no deformity, no limitation of range of movement in spin Heme: No easy bruising.  Travel history: No recent long distant travel.  Allergy:  Allergies  Allergen Reactions   Adhesive [Tape] Dermatitis    SKIN TURNS RED-PAPER TAPE OK TO USE   Claritin [Loratadine] Swelling   Macrodantin [Nitrofurantoin] Nausea And Vomiting   Metronidazole Nausea Only   Nitrofurantoin Macrocrystal Nausea And Vomiting   Sulfamethoxazole-Trimethoprim Nausea Only   Cephalexin Rash   Clindamycin Hcl Rash   Doxycycline Swelling and Rash   Erythromycin Rash    Past Medical History:  Diagnosis Date   Actinic keratosis    Allergy    Anxiety    situational anxiety   Arrhythmia    A-fib/A-flutter   Atrial flutter (HCC)    Breast neoplasm, Tis (DCIS), 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  MammoSite   Calculus of kidney    Cancer [facility]) 2017   Left- DCIS   Depression    Diet-controlled type 2 diabetes mellitus ([place])    Difficult intubation 2009   [place] had to use  'boogee" DURING GALLBLADDER SURGERY ONLY-NO PROBLEMS SINCE   Female cystocele 01/05/2011   Fibromyalgia  Gallbladder problem    GERD (gastroesophageal reflux disease)    Headache(784.0)    MIGRAINES   History of kidney stones    IBS (irritable bowel syndrome)    Joint pain    Obesity    OSA (obstructive sleep apnea) 03/27/2019   USE CPAP   OSA (obstructive sleep apnea)    Personal history of radiation therapy 2017   LEFT lumpectomy   PONV (postoperative nausea and vomiting)    h/o difficult intubation and anes had to use "boogee"   Vitamin D  deficiency    Vitreous detachment of right eye    sees floaters    Past Surgical History:  Procedure Laterality Date   ABDOMINAL HYSTERECTOMY  2013   ANTERIOR AND POSTERIOR REPAIR  11/30/2011   Procedure: ANTERIOR (CYSTOCELE) AND POSTERIOR REPAIR (RECTOCELE);  Surgeon: Reece Packer, MD;  Location: [place] [facility];  Service: Urology;  Laterality: N/A;  with cysto   ANTERIOR CERVICAL DECOMP/DISCECTOMY FUSION N/A 10/24/2019   Procedure: ANTERIOR CERVICAL DECOMPRESSION/DISCECTOMY FUSION 2 LEVELS C5-7;  Surgeon: Meade Maw, MD;  Location: [facility];  Service: Neurosurgery;  Laterality: N/A;   ATRIAL FIBRILLATION ABLATION N/A 03/01/2019   Procedure: ATRIAL FIBRILLATION ABLATION;  Surgeon: Constance Haw, MD;  Location: [place] [facility];  Service: Cardiovascular;  Laterality: N/A;   BLADDER SUSPENSION  11/30/2011   Procedure: [place] PROCEDURE;  Surgeon: Reece Packer, MD;  Location: [place] [facility];  Service: Urology;  Laterality: N/A;  graft 10x6   BREAST BIOPSY Right 2007   negative core   BREAST BIOPSY Left 09/16/2015   DCIS   BREAST LUMPECTOMY Left 09/30/2015   DCIS clear margins and rad tx/HIGH GRADE DUCTAL CARCINOMA IN SITU, COMEDO TYPE   BREAST LUMPECTOMY WITH NEEDLE LOCALIZATION Left 09/30/2015   DCIS excision, MammoSite radiation;  Surgeon: Robert Bellow, MD;  Location: [facility];  Service: General;  Laterality: Left;   CATARACT EXTRACTION Left 01/2019   CESAREAN SECTION     x 1   CHOLECYSTECTOMY  2005   COLONOSCOPY  2009   CYSTOSCOPY  11/30/2011   Procedure: CYSTOSCOPY;  Surgeon: Reece Packer, MD;  Location: [place] [facility];  Service: Urology;;   [place] OF UTERUS  05/05/2007   GALLBLADDER SURGERY  2004   TONSILLECTOMY  1961   VAGINAL HYSTERECTOMY  11/30/2011   Procedure: HYSTERECTOMY VAGINAL;  Surgeon: Emily Filbert, MD;  Location: [place] [facility];  Service: Gynecology;  Laterality: N/A;    Social History:  reports that she has never smoked. She has never used  smokeless tobacco. She reports that she does not currently use alcohol. She reports that she does not use drugs.  Family History:  Family History  Problem Relation Age of Onset   Diabetes Mother    Hearing loss Mother    Hypertension Mother    Stroke Mother    Kidney disease Mother    Sleep apnea Mother    Obesity Mother    Heart disease Father    Heart failure Father    Stroke Father    Depression Father    Heart disease Paternal Grandfather    Asthma Daughter    Asthma Daughter    Migraines Daughter    Fibromyalgia Daughter    Interstitial cystitis Daughter    Asthma Daughter    Colitis Daughter    Thyroid cancer Daughter 30   Diabetes Brother    Hypertension Brother    Breast cancer Neg Hx      Prior to  Admission medications   Medication Sig Start Date End Date Taking? Authorizing Provider  acetaminophen (TYLENOL) 500 MG tablet Take 2 tablets (1,000 mg total) by mouth every 8 (eight) hours. OTC 10/24/19  Yes Zdeb, Christine, NP  apixaban (ELIQUIS) 5 MG TABS tablet Take 1 tablet (5 mg total) by mouth 2 (two) times daily. 01/13/21  Yes Baldwin Jamaica, PA-C  Cholecalciferol (PA VITAMIN D-3 GUMMY PO) Takes 2 gummies daily   Yes [provider]  diltiazem (CARDIZEM) 30 MG tablet Take 1 tablet (30 mg total) by mouth 4 (four) times daily as needed (atrial fibrillation or elevated heart rates). 07/25/20  Yes Camnitz, Will Hassell Done, MD  DULoxetine (CYMBALTA) 20 MG capsule Take 2 capsules (40 mg total) by mouth daily. 09/10/20  Yes Steele Sizer, MD  ELDERBERRY PO Take by mouth.   Yes [provider]  gabapentin (NEURONTIN) 300 MG capsule Take 2 capsules (600 mg total) by mouth at bedtime. 09/10/20  Yes Sowles, Drue Stager, MD  Galcanezumab-gnlm (EMGALITY) 120 MG/ML SOAJ Inject 120 mg into the skin every 30 (thirty) days. 10/22/20  Yes [name], MD  magnesium oxide (MAG-OX) 400 MG tablet Take 400 mg by mouth every morning.    Yes [provider]   methocarbamol (ROBAXIN) 500 MG tablet Take 1 tablet (500 mg total) by mouth every 6 (six) hours as needed for muscle spasms. 03/13/20  Yes Sowles, Drue Stager, MD  metoprolol succinate (TOPROL-XL) 25 MG 24 hr tablet TAKE ONE TABLET (25 MG) BY MOUTH EVERY DAY 01/13/21  Yes Baldwin Jamaica, PA-C  omeprazole (PRILOSEC) 20 MG capsule Take 1 capsule (20 mg total) by mouth daily. 09/10/20  Yes Sowles, Drue Stager, MD  Probiotic Product (PROBIOTIC PO) Take 1 capsule by mouth daily.    Yes [provider]  trolamine salicylate (ASPERCREME) 10 % cream Apply 1 application topically as needed.   Yes [provider]  acetaminophen-codeine (TYLENOL #3) 300-30 MG tablet TAKE ONE TABLET BY MOUTH EVERY 4 HOURS AS NEEDED FOR MODERATE OR SEVERE PAIN Patient not taking: Reported on 01/31/2021 09/10/20   [name], MD  bacitracin 500 UNIT/GM ointment as needed. 11/25/20   [provider]  FINACEA 15 % FOAM Apply 1 application topically daily as needed (rosacea).  04/19/16   [provider]  rizatriptan (MAXALT-MLT) 10 MG disintegrating tablet Take 1 tablet (10 mg total) by mouth as needed for migraine. May repeat in 2 hours if needed 10/22/20   [name], MD    Physical Exam: Vitals:   01/31/21 0830 01/31/21 1128 01/31/21 1145 01/31/21 1639  BP: (!) 112/59 (!) 101/52 110/60 (!) 123/54  Pulse: 79  79 72  Resp: 15 18 18 16   Temp:   98 F (36.7 C) 99.2 F (37.3 C)  TempSrc:   Oral Oral  SpO2: 100% 98% 98% 96%  Weight:      Height:       General: Not in acute distress HEENT:       Eyes: PERRL, EOMI, no scleral icterus.       ENT: No discharge from the ears and nose, no pharynx injection, no tonsillar enlargement.        Neck: No JVD, no bruit, no mass felt. Heme: No neck lymph node enlargement. Cardiac: S1/S2, RRR, No murmurs, No gallops or rubs. Respiratory: No rales, wheezing, rhonchi or rubs. GI: Soft, nondistended, nontender, no rebound pain, no organomegaly, BS  present. GU: No hematuria Ext: No pitting leg edema bilaterally. 1+DP/PT pulse  bilaterally. Musculoskeletal: No joint deformities, No joint redness or warmth, no limitation of ROM in spin. Skin: No rashes.  Neuro: Alert, oriented X3, cranial nerves II-XII grossly intact, moves all extremities normally.  Psych: Patient is not psychotic, no suicidal or hemocidal ideation.  Labs on Admission: I have personally reviewed following labs and imaging studies  CBC: Recent Labs  Lab 01/31/21 0435  WBC 12.1*  NEUTROABS 10.5*  HGB 11.5*  HCT 35.6*  MCV 94.2  PLT 782   Basic Metabolic Panel: Recent Labs  Lab 01/31/21 0435  NA 137  K 3.8  CL 105  CO2 26  GLUCOSE 107*  BUN 24*  CREATININE 1.30*  CALCIUM 8.1*  MG 1.8   GFR: Estimated Creatinine Clearance: 52.6 mL/min (A) (by C-G formula based on SCr of 1.3 mg/dL (H)). Liver Function Tests: Recent Labs  Lab 01/31/21 0435  AST 19  ALT 15  ALKPHOS 117  BILITOT 0.4  PROT 6.0*  ALBUMIN 3.1*   No results for input(s): LIPASE, AMYLASE in the last 168 hours. No results for input(s): AMMONIA in the last 168 hours. Coagulation Profile: No results for input(s): INR, PROTIME in the last 168 hours. Cardiac Enzymes: No results for input(s): CKTOTAL, CKMB, CKMBINDEX, TROPONINI in the last 168 hours. BNP (last 3 results) No results for input(s): PROBNP in the last 8760 hours. HbA1C: No results for input(s): HGBA1C in the last 72 hours. CBG: Recent Labs  Lab 01/31/21 0530  GLUCAP 113*   Lipid Profile: No results for input(s): CHOL, HDL, LDLCALC, TRIG, CHOLHDL, LDLDIRECT in the last 72 hours. Thyroid Function Tests: No results for input(s): TSH, T4TOTAL, FREET4, T3FREE, THYROIDAB in the last 72 hours. Anemia Panel: No results for input(s): VITAMINB12, FOLATE, FERRITIN, TIBC, IRON, RETICCTPCT in the last 72 hours. Urine analysis:    Component Value Date/Time   COLORURINE YELLOW (A) 01/31/2021 0435   APPEARANCEUR HAZY (A)  01/31/2021 0435   APPEARANCEUR Clear 10/12/2017 0959   LABSPEC 1.023 01/31/2021 0435   PHURINE 5.0 01/31/2021 0435   GLUCOSEU NEGATIVE 01/31/2021 0435   HGBUR NEGATIVE 01/31/2021 0435   BILIRUBINUR NEGATIVE 01/31/2021 0435   BILIRUBINUR Negative 10/12/2017 [place] 01/31/2021 0435   PROTEINUR NEGATIVE 01/31/2021 0435   UROBILINOGEN 0.2 08/01/2014 1112   NITRITE NEGATIVE 01/31/2021 0435   LEUKOCYTESUR NEGATIVE 01/31/2021 0435   Sepsis Labs: @LABRCNTIP (procalcitonin:4,lacticidven:4) ) Recent Results (from the past 240 hour(s))  Culture, blood (Routine X 2) w Reflex to ID Panel     Status: None (Preliminary result)   Collection Time: 01/31/21  4:35 AM   Specimen: BLOOD  Result Value Ref Range Status   Specimen Description BLOOD LEFT ANTECUBITAL  Final   Special Requests   Final    BOTTLES DRAWN AEROBIC AND ANAEROBIC Blood Culture results may not be optimal due to an inadequate volume of blood received in culture bottles   Culture   Final    NO GROWTH <12 HOURS Performed at [facility], [address], [place], [place] [address]    Report Status PENDING  Incomplete  Culture, blood (Routine X 2) w Reflex to ID Panel     Status: None (Preliminary result)   Collection Time: 01/31/21  4:35 AM   Specimen: BLOOD  Result Value Ref Range Status   Specimen Description BLOOD RIGHT ANTECUBITAL  Final   Special Requests   Final    BOTTLES DRAWN AEROBIC AND ANAEROBIC Blood Culture adequate volume   Culture   Final  NO GROWTH <12 HOURS Performed at [facility], [address] [place], [place] [address]    Report Status PENDING  Incomplete  Resp Panel by RT-PCR (Flu A&B, Covid) Nasopharyngeal Swab     Status: Abnormal   Collection Time: 01/31/21  4:35 AM   Specimen: Nasopharyngeal Swab; Nasopharyngeal(NP) swabs in vial transport medium  Result Value Ref Range Status   SARS Coronavirus 2 by RT PCR POSITIVE (A) NEGATIVE Final    Comment:  (NOTE) SARS-CoV-2 target nucleic acids are DETECTED.  The SARS-CoV-2 RNA is generally detectable in upper respiratory specimens during the acute phase of infection. Positive results are indicative of the presence of the identified virus, but do not rule out bacterial infection or co-infection with other pathogens not detected by the test. Clinical correlation with patient history and other diagnostic information is necessary to determine patient infection status. The expected result is Negative.  Fact Sheet for Patients: EntrepreneurPulse.com.au  Fact Sheet for Healthcare Providers: IncredibleEmployment.be  This test is not yet approved or cleared by the [place] FDA and  has been authorized for detection and/or diagnosis of SARS-CoV-2 by FDA under an Emergency Use Authorization (EUA).  This EUA will remain in effect (meaning this test can be used) for the duration of  the COVID-19 declaration under Section 564(b)(1) of the A ct, 21 U.S.C. section 360bbb-3(b)(1), unless the authorization is terminated or revoked sooner.     Influenza A by PCR NEGATIVE NEGATIVE Final   Influenza B by PCR NEGATIVE NEGATIVE Final    Comment: (NOTE) The Xpert Xpress SARS-CoV-2/FLU/RSV plus assay is intended as an aid in the diagnosis of influenza from Nasopharyngeal swab specimens and should not be used as a sole basis for treatment. Nasal washings and aspirates are unacceptable for Xpert Xpress SARS-CoV-2/FLU/RSV testing.  Fact Sheet for Patients: EntrepreneurPulse.com.au  Fact Sheet for Healthcare Providers: IncredibleEmployment.be  This test is not yet approved or cleared by the [place] FDA and has been authorized for detection and/or diagnosis of SARS-CoV-2 by FDA under an Emergency Use Authorization (EUA). This EUA will remain in effect (meaning this test can be used) for the duration of the COVID-19  declaration under Section 564(b)(1) of the Act, 21 U.S.C. section 360bbb-3(b)(1), unless the authorization is terminated or revoked.  Performed at [facility], [address] [place], [place] [address]      Radiological Exams on Admission: CT HEAD WO CONTRAST (5MM)  Result Date: 01/31/2021 CLINICAL DATA:  Head and neck trauma EXAM: CT HEAD WITHOUT CONTRAST CT CERVICAL SPINE WITHOUT CONTRAST TECHNIQUE: Multidetector CT imaging of the head and cervical spine was performed following the standard protocol without intravenous contrast. Multiplanar CT image reconstructions of the cervical spine were also generated. COMPARISON:  None. FINDINGS: CT HEAD FINDINGS Brain: No evidence of acute infarction, hemorrhage, hydrocephalus, extra-axial collection or mass lesion/mass effect. Vascular: No hyperdense vessel or unexpected calcification. Skull: Normal. Negative for fracture or focal lesion. Sinuses/Orbits: No acute finding. CT CERVICAL SPINE FINDINGS Alignment: No traumatic malalignment Skull base and vertebrae: C5-6 and C6-7 ACDF with solid arthrodesis Soft tissues and spinal canal: No prevertebral fluid or swelling. No visible canal hematoma. Disc levels: C5-6 and C6-7 ACDF with solid arthrodesis. No significant adjacent segment degeneration. Upper chest: No acute finding IMPRESSION: No evidence of acute intracranial or cervical spine injury. Electronically Signed   By: Jorje Guild M.D.   On: 01/31/2021 05:04   CT Angio Chest PE W and/or Wo Contrast  Result Date: 01/31/2021 CLINICAL DATA:  Pulmonary embolism suspected.  High probability. EXAM: CT ANGIOGRAPHY CHEST WITH CONTRAST TECHNIQUE: Multidetector CT imaging of the chest was performed using the standard protocol during bolus administration of intravenous contrast. Multiplanar CT image reconstructions and MIPs were obtained to evaluate the vascular anatomy. CONTRAST:  3mL OMNIPAQUE IOHEXOL 350 MG/ML SOLN COMPARISON:  None. FINDINGS:  Cardiovascular: Satisfactory opacification of the pulmonary arteries to the segmental level. No evidence of pulmonary embolism. Normal heart size. No pericardial effusion. Mediastinum/Nodes: Negative for adenopathy or mass Lungs/Pleura: Dependent atelectasis. There is no edema, consolidation, effusion, or pneumothorax. Upper Abdomen: No acute abnormality. Musculoskeletal: No acute or aggressive finding. Review of the MIP images confirms the above findings. IMPRESSION: Negative for pulmonary embolism or other acute finding. Electronically Signed   By: Jorje Guild M.D.   On: 01/31/2021 06:17   CT Cervical Spine Wo Contrast  Result Date: 01/31/2021 CLINICAL DATA:  Head and neck trauma EXAM: CT HEAD WITHOUT CONTRAST CT CERVICAL SPINE WITHOUT CONTRAST TECHNIQUE: Multidetector CT imaging of the head and cervical spine was performed following the standard protocol without intravenous contrast. Multiplanar CT image reconstructions of the cervical spine were also generated. COMPARISON:  None. FINDINGS: CT HEAD FINDINGS Brain: No evidence of acute infarction, hemorrhage, hydrocephalus, extra-axial collection or mass lesion/mass effect. Vascular: No hyperdense vessel or unexpected calcification. Skull: Normal. Negative for fracture or focal lesion. Sinuses/Orbits: No acute finding. CT CERVICAL SPINE FINDINGS Alignment: No traumatic malalignment Skull base and vertebrae: C5-6 and C6-7 ACDF with solid arthrodesis Soft tissues and spinal canal: No prevertebral fluid or swelling. No visible canal hematoma. Disc levels: C5-6 and C6-7 ACDF with solid arthrodesis. No significant adjacent segment degeneration. Upper chest: No acute finding IMPRESSION: No evidence of acute intracranial or cervical spine injury. Electronically Signed   By: Jorje Guild M.D.   On: 01/31/2021 05:04   DG Chest Portable 1 View  Result Date: 01/31/2021 CLINICAL DATA:  Cough.  COVID exposure EXAM: PORTABLE CHEST 1 VIEW COMPARISON:  07/28/2018  FINDINGS: Low volume chest which accentuates lung markings. There is no edema, consolidation, effusion, or pneumothorax. Normal heart size and mediastinal contours. ACDF hardware. IMPRESSION: Low volume chest without focal pneumonia. Electronically Signed   By: Jorje Guild M.D.   On: 01/31/2021 04:47     EKG: I have personally reviewed.  Sinus rhythm, QTC 444, no ischemic change  Assessment/Plan Principal Problem:   Acute respiratory disease due to COVID-19 virus Active Problems:   Migraine without aura and without status migrainosus, not intractable   Mild major depression (HCC)   PAF (paroxysmal atrial fibrillation) (HCC)   Syncope   OSA (obstructive sleep apnea)   Acute respiratory failure with hypoxia (HCC)   AKI (acute kidney injury) ([place])   Hypotension   Acute respiratory failure with hypoxia due to acute respiratory disease due to COVID-19 virus: Patient has new oxygen requirement.  Currently on 2 L oxygen.  Chest x-ray is negative for infiltration. Patient received 1 dose of vancomycin and aztreonam in the ED.  Will not continue IV antibiotics.    -will admit to med-surg bed as inpt -Remdesivir per pharm -Solumedrol 60 mg bid -Bronchodilators -PRN Mucinex for cough -f/u Blood culture -f/u inflammatory marker, CRP -Will ask the patient to maintain an awake prone position for 16+ hours a day, if possible, with a minimum of 2-3 hours at a time  Hypotension: Possibly due to dehydration.  Blood pressure improved with IV fluid.  Currently 112/59 -IV fluid: 3.5 L LR, followed by 125 cc/h  Migraine without aura and without status migrainosus, not intractable -Patient is getting Emgality injection every month -As needed Rizatriptan  Mild major depression ([place]) -Continue home medications  PAF (paroxysmal atrial fibrillation) ([place]): Heart rate 79 -Hold Cardizem and metoprolol due to hypotension -Continue Eliquis  Syncope: Most likely due to dehydration and hypotension: CT  head negative. -Check orthostatic vital sign -IV fluid as above -PT/OT  OSA (obstructive sleep apnea) -CPAP  AKI (acute kidney injury) ([place]): Likely due to dehydration and ATN is also possible due to hypotension -IV fluid as above -Avoid using renal toxic medications.     DVT ppx: On Eliquis Code Status: Full code Family Communication:   Yes, patient's daughter   at bed side.   Disposition Plan:  Anticipate discharge back to previous environment Consults called:  none Admission status and Level of care: Telemetry Medical:    as inpt      Status is: Inpatient  Remains inpatient appropriate because: Patient has multiple comorbidities, now presents with COVID-19 infection with new oxygen requirement.  Patient also has sepsis, syncope, hypotension, AKI.  Her presentation is highly complicated.  Patient is high risk of deteriorating, will need to be treated in the hospital for at least 2 days          Date of Service 01/31/2021    [facility]   If 7PM-7AM, please contact night-coverage [email or web address] 01/31/2021, 7:33 PM

## 2021-01-31 NOTE — Progress Notes (Signed)
CODE SEPSIS - PHARMACY COMMUNICATION  **Broad Spectrum Antibiotics should be administered within 1 hour of Sepsis diagnosis**  Time Code Sepsis Called/Page Received: 0721  Antibiotics Ordered: Aztreonam and Vancomycin  Time of 1st antibiotic administration: 0501  Renda Rolls, PharmD, [facility] 01/31/2021 4:46 AM

## 2021-01-31 NOTE — Sepsis Progress Note (Signed)
Following for sepsis monitoring ?

## 2021-02-01 DIAGNOSIS — U071 COVID-19: Secondary | ICD-10-CM | POA: Diagnosis not present

## 2021-02-01 DIAGNOSIS — J069 Acute upper respiratory infection, unspecified: Secondary | ICD-10-CM | POA: Diagnosis not present

## 2021-02-01 LAB — COMPREHENSIVE METABOLIC PANEL
ALT: 20 U/L (ref 0–44)
AST: 24 U/L (ref 15–41)
Albumin: 2.9 g/dL — ABNORMAL LOW (ref 3.5–5.0)
Alkaline Phosphatase: 101 U/L (ref 38–126)
Anion gap: 8 (ref 5–15)
BUN: 18 mg/dL (ref 8–23)
CO2: 25 mmol/L (ref 22–32)
Calcium: 8.7 mg/dL — ABNORMAL LOW (ref 8.9–10.3)
Chloride: 104 mmol/L (ref 98–111)
Creatinine, Ser: 0.79 mg/dL (ref 0.44–1.00)
GFR, Estimated: >60 mL/min (ref >60)
Glucose, Bld: 143 mg/dL — ABNORMAL HIGH (ref 70–99)
Potassium: 4.1 mmol/L (ref 3.5–5.1)
Sodium: 137 mmol/L (ref 135–145)
Total Bilirubin: 0.3 mg/dL (ref 0.3–1.2)
Total Protein: 6.2 g/dL — ABNORMAL LOW (ref 6.5–8.1)

## 2021-02-01 LAB — CBC WITH DIFFERENTIAL/PLATELET
Abs Immature Granulocytes: 0.13 10*3/uL — ABNORMAL HIGH (ref 0.00–0.07)
Basophils Absolute: 0 10*3/uL (ref 0.0–0.1)
Basophils Relative: 0 %
Eosinophils Absolute: 0 10*3/uL (ref 0.0–0.5)
Eosinophils Relative: 0 %
HCT: 34.5 % — ABNORMAL LOW (ref 36.0–46.0)
Hemoglobin: 11.4 g/dL — ABNORMAL LOW (ref 12.0–15.0)
Immature Granulocytes: 1 %
Lymphocytes Relative: 5 %
Lymphs Abs: 0.9 10*3/uL (ref 0.7–4.0)
MCH: 30.6 pg (ref 26.0–34.0)
MCHC: 33 g/dL (ref 30.0–36.0)
MCV: 92.7 fL (ref 80.0–100.0)
Monocytes Absolute: 0.3 10*3/uL (ref 0.1–1.0)
Monocytes Relative: 2 %
Neutro Abs: 15.5 10*3/uL — ABNORMAL HIGH (ref 1.7–7.7)
Neutrophils Relative %: 92 %
Platelets: 250 10*3/uL (ref 150–400)
RBC: 3.72 MIL/uL — ABNORMAL LOW (ref 3.87–5.11)
RDW: 15.1 % (ref 11.5–15.5)
WBC: 16.8 10*3/uL — ABNORMAL HIGH (ref 4.0–10.5)
nRBC: 0 % (ref 0.0–0.2)

## 2021-02-01 LAB — C-REACTIVE PROTEIN: CRP: 12 mg/dL — ABNORMAL HIGH (ref <1.0)

## 2021-02-01 LAB — URINE CULTURE

## 2021-02-01 MED ORDER — METHYLPREDNISOLONE SODIUM SUCC 40 MG IJ SOLR
40.0000 mg | Freq: Every day | INTRAMUSCULAR | Status: DC
  Administered 2021-02-02: 40 mg via INTRAVENOUS
  Filled 2021-02-01: qty 1

## 2021-02-01 NOTE — Progress Notes (Signed)
PROGRESS NOTE    [name]  PPI:[id number] DOB: Oct 13, 1955 DOA: 01/31/2021 PCP: Steele Sizer, MD   Brief Narrative: Taken from H&P. Sonya Lopez is a 66 y.o. female with medical history significant of hypertension, diet-controlled diabetes mellitus, GERD, depression, anxiety, atrial fibrillation on Eliquis, OSA on CPAP, IBS, kidney stone, fibromyalgia, breast cancer (s/p left lumpectomy), migraine headache, who presents with cough, syncope.   Patient states that she has been feeling sick since Wednesday 12/28.  She has a generalized weakness, cough, sore throat, mild fever and chills. She was found to be hypotensive with blood pressure of 60s over 30s.  Improved with IV fluid. Desaturating in high 80s on room air-requiring up to 2 L of oxygen CTA negative for PE.  COVID-19 PCR came back positive. She was started on remdesivir.  Subjective: Patient was seen and examined today.  Continues to have some cough.  No baseline oxygen use.  Assessment & Plan:   Principal Problem:   Acute respiratory disease due to COVID-19 virus Active Problems:   Migraine without aura and without status migrainosus, not intractable   Mild major depression (HCC)   PAF (paroxysmal atrial fibrillation) (HCC)   Syncope   OSA (obstructive sleep apnea)   Acute respiratory failure with hypoxia (HCC)   AKI (acute kidney injury) ([place])   Hypotension  Acute hypoxic respiratory failure secondary to COVID-19 infection.  Chest x-ray negative for any acute abnormality.  No baseline oxygen use, currently saturating in mid 90s on 2 L of oxygen. Elevated inflammatory markers.  Procalcitonin at 0.11 -Continue with remdesivir-day 2 -Continue with steroid-decrease to daily dose -Continue supplemental oxygen-wean as tolerated -Continue with supportive care and supplements  Hypotension.  Resolved with IV fluid -Continue to monitor  Syncope: Most likely due to dehydration and hypotension: CT head  negative. -PT/OT-no specific recommendations  Migraine without aura and without status migrainosus, not intractable -Patient is getting Emgality injection every month -As needed Rizatriptan   Mild major depression (HCC) -Continue home medications   PAF (paroxysmal atrial fibrillation) ([place]): Heart rate 79 -Hold Cardizem and metoprolol due to hypotension -Continue Eliquis   AKI.  Resolved with IV fluid. -Continue to monitor  OSA (obstructive sleep apnea) -CPAP  Objective: Vitals:   02/01/21 0832 02/01/21 0859 02/01/21 1331 02/01/21 1646  BP: (!) 126/40 (!) 132/53 (!) 132/54 (!) 133/56  Pulse: 69 71 70 71  Resp:  17 19 16   Temp:  98.2 F (36.8 C) 98.3 F (36.8 C) 98.1 F (36.7 C)  TempSrc: Oral Oral Oral Oral  SpO2:  95% 93% 92%  Weight:      Height:        Intake/Output Summary (Last 24 hours) at 02/01/2021 1701 Last data filed at 02/01/2021 1600 Gross per 24 hour  Intake 2096.6 ml  Output 700 ml  Net 1396.6 ml   Filed Weights   01/31/21 0506  Weight: 109.5 kg    Examination:  General exam: Appears calm and comfortable  Respiratory system: Clear to auscultation. Respiratory effort normal. Cardiovascular system: S1 & S2 heard, RRR.  Gastrointestinal system: Soft, nontender, nondistended, bowel sounds positive. Central nervous system: Alert and oriented. No focal neurological deficits. Extremities: No edema, no cyanosis, pulses intact and symmetrical. Psychiatry: Judgement and insight appear normal.     DVT prophylaxis: Eliquis Code Status: Full Family Communication:  Disposition Plan:  Status is: Inpatient  Remains inpatient appropriate because: Severity of illness  Level of care: Telemetry Medical  All the records are reviewed and  case discussed with Care Management/Social Worker. Management plans discussed with the patient, nursing and they are in agreement.  Consultants:  None  Procedures:  Antimicrobials:   Data Reviewed: I have personally  reviewed following labs and imaging studies  CBC: Recent Labs  Lab 01/31/21 0435 02/01/21 0701  WBC 12.1* 16.8*  NEUTROABS 10.5* 15.5*  HGB 11.5* 11.4*  HCT 35.6* 34.5*  MCV 94.2 92.7  PLT 230 408   Basic Metabolic Panel: Recent Labs  Lab 01/31/21 0435 02/01/21 0701  NA 137 137  K 3.8 4.1  CL 105 104  CO2 26 25  GLUCOSE 107* 143*  BUN 24* 18  CREATININE 1.30* 0.79  CALCIUM 8.1* 8.7*  MG 1.8  --    GFR: Estimated Creatinine Clearance: 85.5 mL/min (by C-G formula based on SCr of 0.79 mg/dL). Liver Function Tests: Recent Labs  Lab 01/31/21 0435 02/01/21 0701  AST 19 24  ALT 15 20  ALKPHOS 117 101  BILITOT 0.4 0.3  PROT 6.0* 6.2*  ALBUMIN 3.1* 2.9*   No results for input(s): LIPASE, AMYLASE in the last 168 hours. No results for input(s): AMMONIA in the last 168 hours. Coagulation Profile: No results for input(s): INR, PROTIME in the last 168 hours. Cardiac Enzymes: No results for input(s): CKTOTAL, CKMB, CKMBINDEX, TROPONINI in the last 168 hours. BNP (last 3 results) No results for input(s): PROBNP in the last 8760 hours. HbA1C: No results for input(s): HGBA1C in the last 72 hours. CBG: Recent Labs  Lab 01/31/21 0530  GLUCAP 113*   Lipid Profile: No results for input(s): CHOL, HDL, LDLCALC, TRIG, CHOLHDL, LDLDIRECT in the last 72 hours. Thyroid Function Tests: No results for input(s): TSH, T4TOTAL, FREET4, T3FREE, THYROIDAB in the last 72 hours. Anemia Panel: No results for input(s): VITAMINB12, FOLATE, FERRITIN, TIBC, IRON, RETICCTPCT in the last 72 hours. Sepsis Labs: Recent Labs  Lab 01/31/21 0435  PROCALCITON 0.11  LATICACIDVEN 1.4    Recent Results (from the past 240 hour(s))  Urine Culture     Status: Abnormal   Collection Time: 01/31/21  4:35 AM   Specimen: Urine, Clean Catch  Result Value Ref Range Status   Specimen Description   Final    URINE, CLEAN CATCH Performed at [facility], [address], [place], [place]  [address]    Special Requests   Final    NONE Performed at [facility], [address], [place], [place] [address]    Culture MULTIPLE SPECIES PRESENT, SUGGEST RECOLLECTION (A)  Final   Report Status 02/01/2021 FINAL  Final  Culture, blood (Routine X 2) w Reflex to ID Panel     Status: None (Preliminary result)   Collection Time: 01/31/21  4:35 AM   Specimen: BLOOD  Result Value Ref Range Status   Specimen Description BLOOD LEFT ANTECUBITAL  Final   Special Requests   Final    BOTTLES DRAWN AEROBIC AND ANAEROBIC Blood Culture results may not be optimal due to an inadequate volume of blood received in culture bottles   Culture   Final    NO GROWTH 1 DAY Performed at [facility], [address] [place], [place] [address]    Report Status PENDING  Incomplete  Culture, blood (Routine X 2) w Reflex to ID Panel     Status: None (Preliminary result)   Collection Time: 01/31/21  4:35 AM   Specimen: BLOOD  Result Value Ref Range Status   Specimen Description BLOOD RIGHT ANTECUBITAL  Final   Special  Requests   Final    BOTTLES DRAWN AEROBIC AND ANAEROBIC Blood Culture adequate volume   Culture   Final    NO GROWTH 1 DAY Performed at [facility], [address] [place], [place] [address]    Report Status PENDING  Incomplete  Resp Panel by RT-PCR (Flu A&B, Covid) Nasopharyngeal Swab     Status: Abnormal   Collection Time: 01/31/21  4:35 AM   Specimen: Nasopharyngeal Swab; Nasopharyngeal(NP) swabs in vial transport medium  Result Value Ref Range Status   SARS Coronavirus 2 by RT PCR POSITIVE (A) NEGATIVE Final    Comment: (NOTE) SARS-CoV-2 target nucleic acids are DETECTED.  The SARS-CoV-2 RNA is generally detectable in upper respiratory specimens during the acute phase of infection. Positive results are indicative of the presence of the identified virus, but do not rule out bacterial infection or co-infection with other pathogens not detected by the  test. Clinical correlation with patient history and other diagnostic information is necessary to determine patient infection status. The expected result is Negative.  Fact Sheet for Patients: EntrepreneurPulse.com.au  Fact Sheet for Healthcare Providers: IncredibleEmployment.be  This test is not yet approved or cleared by the [place] FDA and  has been authorized for detection and/or diagnosis of SARS-CoV-2 by FDA under an Emergency Use Authorization (EUA).  This EUA will remain in effect (meaning this test can be used) for the duration of  the COVID-19 declaration under Section 564(b)(1) of the A ct, 21 U.S.C. section 360bbb-3(b)(1), unless the authorization is terminated or revoked sooner.     Influenza A by PCR NEGATIVE NEGATIVE Final   Influenza B by PCR NEGATIVE NEGATIVE Final    Comment: (NOTE) The Xpert Xpress SARS-CoV-2/FLU/RSV plus assay is intended as an aid in the diagnosis of influenza from Nasopharyngeal swab specimens and should not be used as a sole basis for treatment. Nasal washings and aspirates are unacceptable for Xpert Xpress SARS-CoV-2/FLU/RSV testing.  Fact Sheet for Patients: EntrepreneurPulse.com.au  Fact Sheet for Healthcare Providers: IncredibleEmployment.be  This test is not yet approved or cleared by the [place] FDA and has been authorized for detection and/or diagnosis of SARS-CoV-2 by FDA under an Emergency Use Authorization (EUA). This EUA will remain in effect (meaning this test can be used) for the duration of the COVID-19 declaration under Section 564(b)(1) of the Act, 21 U.S.C. section 360bbb-3(b)(1), unless the authorization is terminated or revoked.  Performed at [facility], [address] [place], [place] [address]      Radiology Studies: CT HEAD WO CONTRAST (5MM)  Result Date: 01/31/2021 CLINICAL DATA:  Head and neck trauma EXAM: CT  HEAD WITHOUT CONTRAST CT CERVICAL SPINE WITHOUT CONTRAST TECHNIQUE: Multidetector CT imaging of the head and cervical spine was performed following the standard protocol without intravenous contrast. Multiplanar CT image reconstructions of the cervical spine were also generated. COMPARISON:  None. FINDINGS: CT HEAD FINDINGS Brain: No evidence of acute infarction, hemorrhage, hydrocephalus, extra-axial collection or mass lesion/mass effect. Vascular: No hyperdense vessel or unexpected calcification. Skull: Normal. Negative for fracture or focal lesion. Sinuses/Orbits: No acute finding. CT CERVICAL SPINE FINDINGS Alignment: No traumatic malalignment Skull base and vertebrae: C5-6 and C6-7 ACDF with solid arthrodesis Soft tissues and spinal canal: No prevertebral fluid or swelling. No visible canal hematoma. Disc levels: C5-6 and C6-7 ACDF with solid arthrodesis. No significant adjacent segment degeneration. Upper chest: No acute finding IMPRESSION: No evidence of acute intracranial or cervical spine injury. Electronically Signed   By: Roderic Palau  Watts M.D.   On: 01/31/2021 05:04   CT Angio Chest PE W and/or Wo Contrast  Result Date: 01/31/2021 CLINICAL DATA:  Pulmonary embolism suspected.  High probability. EXAM: CT ANGIOGRAPHY CHEST WITH CONTRAST TECHNIQUE: Multidetector CT imaging of the chest was performed using the standard protocol during bolus administration of intravenous contrast. Multiplanar CT image reconstructions and MIPs were obtained to evaluate the vascular anatomy. CONTRAST:  45mL OMNIPAQUE IOHEXOL 350 MG/ML SOLN COMPARISON:  None. FINDINGS: Cardiovascular: Satisfactory opacification of the pulmonary arteries to the segmental level. No evidence of pulmonary embolism. Normal heart size. No pericardial effusion. Mediastinum/Nodes: Negative for adenopathy or mass Lungs/Pleura: Dependent atelectasis. There is no edema, consolidation, effusion, or pneumothorax. Upper Abdomen: No acute abnormality.  Musculoskeletal: No acute or aggressive finding. Review of the MIP images confirms the above findings. IMPRESSION: Negative for pulmonary embolism or other acute finding. Electronically Signed   By: Jorje Guild M.D.   On: 01/31/2021 06:17   CT Cervical Spine Wo Contrast  Result Date: 01/31/2021 CLINICAL DATA:  Head and neck trauma EXAM: CT HEAD WITHOUT CONTRAST CT CERVICAL SPINE WITHOUT CONTRAST TECHNIQUE: Multidetector CT imaging of the head and cervical spine was performed following the standard protocol without intravenous contrast. Multiplanar CT image reconstructions of the cervical spine were also generated. COMPARISON:  None. FINDINGS: CT HEAD FINDINGS Brain: No evidence of acute infarction, hemorrhage, hydrocephalus, extra-axial collection or mass lesion/mass effect. Vascular: No hyperdense vessel or unexpected calcification. Skull: Normal. Negative for fracture or focal lesion. Sinuses/Orbits: No acute finding. CT CERVICAL SPINE FINDINGS Alignment: No traumatic malalignment Skull base and vertebrae: C5-6 and C6-7 ACDF with solid arthrodesis Soft tissues and spinal canal: No prevertebral fluid or swelling. No visible canal hematoma. Disc levels: C5-6 and C6-7 ACDF with solid arthrodesis. No significant adjacent segment degeneration. Upper chest: No acute finding IMPRESSION: No evidence of acute intracranial or cervical spine injury. Electronically Signed   By: Jorje Guild M.D.   On: 01/31/2021 05:04   DG Chest Portable 1 View  Result Date: 01/31/2021 CLINICAL DATA:  Cough.  COVID exposure EXAM: PORTABLE CHEST 1 VIEW COMPARISON:  07/28/2018 FINDINGS: Low volume chest which accentuates lung markings. There is no edema, consolidation, effusion, or pneumothorax. Normal heart size and mediastinal contours. ACDF hardware. IMPRESSION: Low volume chest without focal pneumonia. Electronically Signed   By: Jorje Guild M.D.   On: 01/31/2021 04:47    Scheduled Meds:  apixaban  5 mg Oral BID    cholecalciferol  500 Units Oral Daily   DULoxetine  40 mg Oral Daily   gabapentin  600 mg Oral QHS   ipratropium  2 puff Inhalation Q4H   magnesium oxide  400 mg Oral BH-q7a   methylPREDNISolone (SOLU-MEDROL) injection  60 mg Intravenous Q12H   pantoprazole  40 mg Oral Daily   saccharomyces boulardii   Oral Daily   Continuous Infusions:  remdesivir 100 mg in NS 100 mL 100 mg (02/01/21 1033)     LOS: 1 day   Time spent: 40 minutes. More than 50% of the time was spent in counseling/coordination of care  Lorella Nimrod, MD [facility]  If 7PM-7AM, please contact night-coverage [email or web address]  02/01/2021, 5:01 PM   This record has been created using Systems analyst. Errors have been sought and corrected,but may not always be located. Such creation errors do not reflect on the standard of care.

## 2021-02-01 NOTE — Progress Notes (Signed)
OT Screen Note  Patient Details Name: Sonya Lopez MRN: [id number] DOB: 1955/05/08   Cancelled Treatment:    Reason Eval/Treat Not Completed: OT screened, no needs identified, will sign off. OT order received. Chart reviewed. Spoke with physical therapist who saw pt just prior to OT attempt. Per, physical therapist pt is independent with all mobility. Pt reports baseline independence with ADL management at this time. No skilled OT needs identified. Will sign off. Please re-consult if additional needs arise during this hospital stay.   Shara Blazing, M.S., OTR/L [facility] - [facility] Ascom: [phone] 02/01/21, 12:31 PM

## 2021-02-01 NOTE — Evaluation (Signed)
Physical Therapy Evaluation Patient Details Name: Sonya Lopez MRN: [id number] DOB: July 13, 1955 Today's Date: 02/01/2021  History of Present Illness  Per MD Notes: Sonya Lopez is a 66 y.o. female with medical history significant of hypertension, diet-controlled diabetes mellitus, GERD, depression, anxiety, atrial fibrillation on Eliquis, OSA on CPAP, IBS, kidney stone, fibromyalgia, & breast cancer (s/p left lumpectomy), who presented to the ED with cough and recent syncope. Pt found to be COVID (+) while in ED. Admitted for acute respiratory disease.   Clinical Impression  Pt admitted with above diagnosis. Pt received in bed agreeable to PT services. Resting on 2L/min with SPO2 > 90%. Pt reports being indep at baseline with all mobility, ADL's/IADL's. Pt titrated to RA with SPO2 remaining >/= 90%. Pt indep with all bed mobility, transfers, and ambulating ~200' with no imbalance, normal, reciprocal gait appreciated with pt endorsing typical fatigue commonly seen from COVID-19. Pt did desaturate to 89% but with PLB and seated rest returns quickly to >90%. Max HR did achieve 144 Bpm with ambulation but does decrease to mid 90's BPM at rest. RN aware of O2 sats. Pt appears close/near to baseline function despite minor reports of weakness and fatigue. Pt from mobility stand point is safe to d/c home when medically appropriate with no acute PT needs or f/u recommendations. PT to sign off.     Recommendations for follow up therapy are one component of a multi-disciplinary discharge planning process, led by the attending physician.  Recommendations may be updated based on patient status, additional functional criteria and insurance authorization.  Follow Up Recommendations No PT follow up    Assistance Recommended at Discharge None  Functional Status Assessment Patient has not had a recent decline in their functional status  Equipment Recommendations  None recommended by PT     Recommendations for Other Services       Precautions / Restrictions Precautions Precautions: None Precaution Comments: COVID + Restrictions Weight Bearing Restrictions: No      Mobility  Bed Mobility Overal bed mobility: Modified Independent               Patient Response: Cooperative  Transfers Overall transfer level: Modified independent                      Ambulation/Gait Ambulation/Gait assistance: Independent Gait Distance (Feet): 200 Feet Assistive device: None Gait Pattern/deviations: Step-through pattern          Stairs            Wheelchair Mobility    Modified Rankin (Stroke Patients Only)       Balance Overall balance assessment: Needs assistance           Standing balance-Leahy Scale: Good                               Pertinent Vitals/Pain Pain Assessment: No/denies pain (endorses sore throat)    Home Living Family/patient expects to be discharged to:: Private residence Living Arrangements: Spouse/significant other;Children Available Help at Discharge: Family;Available PRN/intermittently Type of Home: House Home Access: Stairs to enter Entrance Stairs-Rails: Right (getting rail installed on L side) Entrance Stairs-Number of Steps: 5   Home Layout: One level Home Equipment: Grab bars - tub/shower      Prior Function Prior Level of Function : Independent/Modified Independent  Hand Dominance        Extremity/Trunk Assessment   Upper Extremity Assessment Upper Extremity Assessment: Overall WFL for tasks assessed    Lower Extremity Assessment Lower Extremity Assessment: Overall WFL for tasks assessed    Cervical / Trunk Assessment Cervical / Trunk Assessment: Normal  Communication   Communication: No difficulties  Cognition Arousal/Alertness: Awake/alert Behavior During Therapy: WFL for tasks assessed/performed Overall Cognitive Status: Within Functional  Limits for tasks assessed                                          General Comments General comments (skin integrity, edema, etc.): Max HR up to 142 BPM with vtach per telemetry unit, SPO2 down to 89% post ambulation on RA. Returns to > 90% with PLB and seated rest very quickly (<10 sec). At 94% upon exit of room. RN aware pt left on RA. HR returned to mid 90s BPM at rest    Exercises Other Exercises Other Exercises: Role of PT in acute setting, D/c recs, PLB   Assessment/Plan    PT Assessment Patient does not need any further PT services  PT Problem List         PT Treatment Interventions      PT Goals (Current goals can be found in the Care Plan section)  Acute Rehab PT Goals Patient Stated Goal: to go home PT Goal Formulation: With patient Time For Goal Achievement: 02/15/21 Potential to Achieve Goals: Good    Frequency     Barriers to discharge        Co-evaluation               AM-PAC PT "6 Clicks" Mobility  Outcome Measure Help needed turning from your back to your side while in a flat bed without using bedrails?: None Help needed moving from lying on your back to sitting on the side of a flat bed without using bedrails?: None Help needed moving to and from a bed to a chair (including a wheelchair)?: None Help needed standing up from a chair using your arms (e.g., wheelchair or bedside chair)?: None Help needed to walk in hospital room?: None Help needed climbing 3-5 steps with a railing? : A Little 6 Click Score: 23    End of Session   Activity Tolerance: Patient tolerated treatment well Patient left: in chair;with call bell/phone within reach Nurse Communication: Mobility status      Time: 1140-1158 PT Time Calculation (min) (ACUTE ONLY): 18 min   Charges:   PT Evaluation $PT Eval Low Complexity: 1 Low          Graydon Fofana M. Fairly IV, PT, DPT Physical Therapist- [facility]  02/01/2021,  12:14 PM

## 2021-02-01 NOTE — TOC CM/SW Note (Signed)
°  Transition of Care [facility]) Screening Note   Patient Details  Name: Sonya Lopez Date of Birth: 01/27/1956   Transition of Care [facility]) CM/SW Contact:    Magnus Ivan, LCSW Phone Number: 02/01/2021, 8:39 AM    Transition of Care Department [facility]) has reviewed patient and no TOC needs have been identified at this time. We will continue to monitor patient advancement through interdisciplinary progression rounds. If new patient transition needs arise, please place a TOC consult.

## 2021-02-02 DIAGNOSIS — J069 Acute upper respiratory infection, unspecified: Secondary | ICD-10-CM | POA: Diagnosis not present

## 2021-02-02 DIAGNOSIS — U071 COVID-19: Secondary | ICD-10-CM | POA: Diagnosis not present

## 2021-02-02 LAB — COMPREHENSIVE METABOLIC PANEL
ALT: 20 U/L (ref 0–44)
AST: 23 U/L (ref 15–41)
Albumin: 3.4 g/dL — ABNORMAL LOW (ref 3.5–5.0)
Alkaline Phosphatase: 113 U/L (ref 38–126)
Anion gap: 6 (ref 5–15)
BUN: 25 mg/dL — ABNORMAL HIGH (ref 8–23)
CO2: 30 mmol/L (ref 22–32)
Calcium: 8.9 mg/dL (ref 8.9–10.3)
Chloride: 105 mmol/L (ref 98–111)
Creatinine, Ser: 0.85 mg/dL (ref 0.44–1.00)
GFR, Estimated: >60 mL/min (ref >60)
Glucose, Bld: 121 mg/dL — ABNORMAL HIGH (ref 70–99)
Potassium: 4.3 mmol/L (ref 3.5–5.1)
Sodium: 141 mmol/L (ref 135–145)
Total Bilirubin: 0.6 mg/dL (ref 0.3–1.2)
Total Protein: 6.9 g/dL (ref 6.5–8.1)

## 2021-02-02 LAB — CBC
HCT: 37.6 % (ref 36.0–46.0)
Hemoglobin: 12.1 g/dL (ref 12.0–15.0)
MCH: 30 pg (ref 26.0–34.0)
MCHC: 32.2 g/dL (ref 30.0–36.0)
MCV: 93.3 fL (ref 80.0–100.0)
Platelets: 281 10*3/uL (ref 150–400)
RBC: 4.03 MIL/uL (ref 3.87–5.11)
RDW: 15.4 % (ref 11.5–15.5)
WBC: 15.2 10*3/uL — ABNORMAL HIGH (ref 4.0–10.5)
nRBC: 0 % (ref 0.0–0.2)

## 2021-02-02 LAB — C-REACTIVE PROTEIN: CRP: 5 mg/dL — ABNORMAL HIGH (ref <1.0)

## 2021-02-02 MED ORDER — DM-GUAIFENESIN ER 30-600 MG PO TB12: 1.0000 | ORAL_TABLET | Freq: Two times a day (BID) | ORAL | 0 refills | Status: DC | PRN

## 2021-02-02 MED ORDER — PREDNISONE 20 MG PO TABS
20.0000 mg | ORAL_TABLET | Freq: Every day | ORAL | 0 refills | Status: AC
Start: 2021-02-02 — End: 2021-02-07

## 2021-02-02 MED ORDER — PREDNISONE 20 MG PO TABS: 20.0000 mg | ORAL_TABLET | Freq: Every day | ORAL | 0 refills | Status: AC

## 2021-02-02 NOTE — Progress Notes (Signed)
Patient discharged home in stable condition via w/c with daughter.

## 2021-02-02 NOTE — Discharge Summary (Signed)
Physician Discharge Summary  Sonya Lopez WPY:[id number] DOB: Jun 20, 1955 DOA: 01/31/2021  PCP: Steele Sizer, MD  Admit date: 01/31/2021 Discharge date: 02/02/2021  Admitted From: Home Disposition: Home  Recommendations for Outpatient Follow-up:  Follow up with PCP in 1-2 weeks Please obtain BMP/CBC in one week Please follow up on the following pending results: None  Home Health: No Equipment/Devices: None Discharge Condition: Stable CODE STATUS: Full Diet recommendation: Heart Healthy / Carb Modified   Brief/Interim Summary: Sonya Lopez is a 66 y.o. female with medical history significant of hypertension, diet-controlled diabetes mellitus, GERD, depression, anxiety, atrial fibrillation on Eliquis, OSA on CPAP, IBS, kidney stone, fibromyalgia, breast cancer (s/p left lumpectomy), migraine headache, who presents with cough, syncope.   Patient states that she has been feeling sick since Wednesday 12/28.  She has a generalized weakness, cough, sore throat, mild fever and chills. She was found to be hypotensive with blood pressure of 60s over 30s.  Improved with IV fluid. Desaturating in high 80s on room air-initially requiring up to 2 L of oxygen, able to wean back to room air quickly. CTA negative for PE.  COVID-19 PCR came back positive. She received Solu-Medrol and 3 doses of remdesivir while in the hospital.  Discharged on 5 more days of prednisone.  She was also given Mucinex with vitamin C and zinc supplement. Patient had most likely syncope secondary to hypotension and dehydration.  CT head was negative.  PT/OT evaluated her and there was no specific recommendations for follow-up. Patient encouraged p.o. hydration after IV fluids.  Her home dose of metoprolol and Cardizem was held initially due to softer blood pressure, blood pressure improved and she can resume on discharge.  She will continue with rest of her home medications and follow-up with her  providers.  Discharge Diagnoses:  Principal Problem:   Acute respiratory disease due to COVID-19 virus Active Problems:   Migraine without aura and without status migrainosus, not intractable   Mild major depression (HCC)   PAF (paroxysmal atrial fibrillation) (HCC)   Syncope   OSA (obstructive sleep apnea)   Acute respiratory failure with hypoxia (HCC)   AKI (acute kidney injury) ([place])   Hypotension   Discharge Instructions  Discharge Instructions     Diet - low sodium heart healthy   Complete by: As directed    Discharge instructions   Complete by: As directed    It was pleasure taking care of you. You are being given prednisone for 5 more days. Keep yourself well hydrated and follow up with your doctor.   Increase activity slowly   Complete by: As directed       Allergies as of 02/02/2021       Reactions   Adhesive [tape] Dermatitis   SKIN TURNS RED-PAPER TAPE OK TO USE   Claritin [loratadine] Swelling   Macrodantin [nitrofurantoin] Nausea And Vomiting   Metronidazole Nausea Only   Nitrofurantoin Macrocrystal Nausea And Vomiting   Sulfamethoxazole-trimethoprim Nausea Only   Cephalexin Rash   Clindamycin Hcl Rash   Doxycycline Swelling, Rash   Erythromycin Rash        Medication List     STOP taking these medications    acetaminophen-codeine 300-30 MG tablet Commonly known as: TYLENOL #3       TAKE these medications    acetaminophen 500 MG tablet Commonly known as: TYLENOL Take 2 tablets (1,000 mg total) by mouth every 8 (eight) hours. OTC   apixaban 5 MG Tabs tablet Commonly known as: Eliquis  Take 1 tablet (5 mg total) by mouth 2 (two) times daily.   bacitracin 500 UNIT/GM ointment as needed.   dextromethorphan-guaiFENesin 30-600 MG 12hr tablet Commonly known as: MUCINEX DM Take 1 tablet by mouth 2 (two) times daily as needed for cough.   diltiazem 30 MG tablet Commonly known as: Cardizem Take 1 tablet (30 mg total) by mouth 4 (four)  times daily as needed (atrial fibrillation or elevated heart rates).   DULoxetine 20 MG capsule Commonly known as: CYMBALTA Take 2 capsules (40 mg total) by mouth daily.   ELDERBERRY PO Take by mouth.   Emgality 120 MG/ML Soaj Generic drug: Galcanezumab-gnlm Inject 120 mg into the skin every 30 (thirty) days.   Finacea 15 % Foam Generic drug: Azelaic Acid Apply 1 application topically daily as needed (rosacea).   gabapentin 300 MG capsule Commonly known as: NEURONTIN Take 2 capsules (600 mg total) by mouth at bedtime.   magnesium oxide 400 MG tablet Commonly known as: MAG-OX Take 400 mg by mouth every morning.   methocarbamol 500 MG tablet Commonly known as: ROBAXIN Take 1 tablet (500 mg total) by mouth every 6 (six) hours as needed for muscle spasms.   metoprolol succinate 25 MG 24 hr tablet Commonly known as: TOPROL-XL TAKE ONE TABLET (25 MG) BY MOUTH EVERY DAY   omeprazole 20 MG capsule Commonly known as: PRILOSEC Take 1 capsule (20 mg total) by mouth daily.   PA VITAMIN D-3 GUMMY PO Takes 2 gummies daily   predniSONE 20 MG tablet Commonly known as: DELTASONE Take 1 tablet (20 mg total) by mouth daily with breakfast for 5 days.   PROBIOTIC PO Take 1 capsule by mouth daily.   rizatriptan 10 MG disintegrating tablet Commonly known as: MAXALT-MLT Take 1 tablet (10 mg total) by mouth as needed for migraine. May repeat in 2 hours if needed   trolamine salicylate 10 % cream Commonly known as: ASPERCREME Apply 1 application topically as needed.        Follow-up Information     Steele Sizer, MD. Schedule an appointment as soon as possible for a visit in 1 week(s).   Specialty: Family Medicine Why: Facility to make follow up appts Contact information: [address] [place] [address] [phone]         Constance Haw, MD .   Specialty: Cardiology Why: Facility to make follow up appts Contact information: [place] [address] [phone]                Allergies  Allergen Reactions   Adhesive [Tape] Dermatitis    SKIN TURNS RED-PAPER TAPE OK TO USE   Claritin [Loratadine] Swelling   Macrodantin [Nitrofurantoin] Nausea And Vomiting   Metronidazole Nausea Only   Nitrofurantoin Macrocrystal Nausea And Vomiting   Sulfamethoxazole-Trimethoprim Nausea Only   Cephalexin Rash   Clindamycin Hcl Rash   Doxycycline Swelling and Rash   Erythromycin Rash    Consultations: None  Procedures/Studies: CT HEAD WO CONTRAST (5MM)  Result Date: 01/31/2021 CLINICAL DATA:  Head and neck trauma EXAM: CT HEAD WITHOUT CONTRAST CT CERVICAL SPINE WITHOUT CONTRAST TECHNIQUE: Multidetector CT imaging of the head and cervical spine was performed following the standard protocol without intravenous contrast. Multiplanar CT image reconstructions of the cervical spine were also generated. COMPARISON:  None. FINDINGS: CT HEAD FINDINGS Brain: No evidence of acute infarction, hemorrhage, hydrocephalus, extra-axial collection or mass lesion/mass effect. Vascular: No hyperdense vessel or unexpected calcification. Skull: Normal. Negative  for fracture or focal lesion. Sinuses/Orbits: No acute finding. CT CERVICAL SPINE FINDINGS Alignment: No traumatic malalignment Skull base and vertebrae: C5-6 and C6-7 ACDF with solid arthrodesis Soft tissues and spinal canal: No prevertebral fluid or swelling. No visible canal hematoma. Disc levels: C5-6 and C6-7 ACDF with solid arthrodesis. No significant adjacent segment degeneration. Upper chest: No acute finding IMPRESSION: No evidence of acute intracranial or cervical spine injury. Electronically Signed   By: Jorje Guild M.D.   On: 01/31/2021 05:04   CT Angio Chest PE W and/or Wo Contrast  Result Date: 01/31/2021 CLINICAL DATA:  Pulmonary embolism suspected.  High probability. EXAM: CT ANGIOGRAPHY CHEST WITH CONTRAST TECHNIQUE: Multidetector CT imaging of the chest was  performed using the standard protocol during bolus administration of intravenous contrast. Multiplanar CT image reconstructions and MIPs were obtained to evaluate the vascular anatomy. CONTRAST:  34mL OMNIPAQUE IOHEXOL 350 MG/ML SOLN COMPARISON:  None. FINDINGS: Cardiovascular: Satisfactory opacification of the pulmonary arteries to the segmental level. No evidence of pulmonary embolism. Normal heart size. No pericardial effusion. Mediastinum/Nodes: Negative for adenopathy or mass Lungs/Pleura: Dependent atelectasis. There is no edema, consolidation, effusion, or pneumothorax. Upper Abdomen: No acute abnormality. Musculoskeletal: No acute or aggressive finding. Review of the MIP images confirms the above findings. IMPRESSION: Negative for pulmonary embolism or other acute finding. Electronically Signed   By: Jorje Guild M.D.   On: 01/31/2021 06:17   CT Cervical Spine Wo Contrast  Result Date: 01/31/2021 CLINICAL DATA:  Head and neck trauma EXAM: CT HEAD WITHOUT CONTRAST CT CERVICAL SPINE WITHOUT CONTRAST TECHNIQUE: Multidetector CT imaging of the head and cervical spine was performed following the standard protocol without intravenous contrast. Multiplanar CT image reconstructions of the cervical spine were also generated. COMPARISON:  None. FINDINGS: CT HEAD FINDINGS Brain: No evidence of acute infarction, hemorrhage, hydrocephalus, extra-axial collection or mass lesion/mass effect. Vascular: No hyperdense vessel or unexpected calcification. Skull: Normal. Negative for fracture or focal lesion. Sinuses/Orbits: No acute finding. CT CERVICAL SPINE FINDINGS Alignment: No traumatic malalignment Skull base and vertebrae: C5-6 and C6-7 ACDF with solid arthrodesis Soft tissues and spinal canal: No prevertebral fluid or swelling. No visible canal hematoma. Disc levels: C5-6 and C6-7 ACDF with solid arthrodesis. No significant adjacent segment degeneration. Upper chest: No acute finding IMPRESSION: No evidence of  acute intracranial or cervical spine injury. Electronically Signed   By: Jorje Guild M.D.   On: 01/31/2021 05:04   DG Chest Portable 1 View  Result Date: 01/31/2021 CLINICAL DATA:  Cough.  COVID exposure EXAM: PORTABLE CHEST 1 VIEW COMPARISON:  07/28/2018 FINDINGS: Low volume chest which accentuates lung markings. There is no edema, consolidation, effusion, or pneumothorax. Normal heart size and mediastinal contours. ACDF hardware. IMPRESSION: Low volume chest without focal pneumonia. Electronically Signed   By: Jorje Guild M.D.   On: 01/31/2021 04:47    Subjective: Patient was seen and examined today.  Feeling much improved and wants to go home.  Daughter at bedside.  Discharge Exam: Vitals:   02/02/21 0527 02/02/21 0754  BP: 115/61 118/68  Pulse: 62 70  Resp: 16 18  Temp: 98 F (36.7 C) 98.2 F (36.8 C)  SpO2: 94% 95%   Vitals:   02/01/21 1646 02/01/21 2021 02/02/21 0527 02/02/21 0754  BP: (!) 133/56 (!) 135/57 115/61 118/68  Pulse: 71 69 62 70  Resp: 16 16 16 18   Temp: 98.1 F (36.7 C) 98.1 F (36.7 C) 98 F (36.7 C) 98.2 F (36.8 C)  TempSrc: Oral  Oral Oral Oral  SpO2: 92% 92% 94% 95%  Weight:      Height:        General: Pt is alert, awake, not in acute distress Cardiovascular: RRR, S1/S2 +, no rubs, no gallops Respiratory: CTA bilaterally, no wheezing, no rhonchi Abdominal: Soft, NT, ND, bowel sounds + Extremities: no edema, no cyanosis   The results of significant diagnostics from this hospitalization (including imaging, microbiology, ancillary and laboratory) are listed below for reference.    Microbiology: Recent Results (from the past 240 hour(s))  Urine Culture     Status: Abnormal   Collection Time: 01/31/21  4:35 AM   Specimen: Urine, Clean Catch  Result Value Ref Range Status   Specimen Description   Final    URINE, CLEAN CATCH Performed at [facility], [address], [place], [place] [address]    Special Requests   Final     NONE Performed at [facility], [address] [place], [place] [address]    Culture MULTIPLE SPECIES PRESENT, SUGGEST RECOLLECTION (A)  Final   Report Status 02/01/2021 FINAL  Final  Culture, blood (Routine X 2) w Reflex to ID Panel     Status: None (Preliminary result)   Collection Time: 01/31/21  4:35 AM   Specimen: BLOOD  Result Value Ref Range Status   Specimen Description BLOOD LEFT ANTECUBITAL  Final   Special Requests   Final    BOTTLES DRAWN AEROBIC AND ANAEROBIC Blood Culture results may not be optimal due to an inadequate volume of blood received in culture bottles   Culture   Final    NO GROWTH 1 DAY Performed at [facility], [address], [place], [place] [address]    Report Status PENDING  Incomplete  Culture, blood (Routine X 2) w Reflex to ID Panel     Status: None (Preliminary result)   Collection Time: 01/31/21  4:35 AM   Specimen: BLOOD  Result Value Ref Range Status   Specimen Description BLOOD RIGHT ANTECUBITAL  Final   Special Requests   Final    BOTTLES DRAWN AEROBIC AND ANAEROBIC Blood Culture adequate volume   Culture   Final    NO GROWTH 1 DAY Performed at [facility], [address] [place], [place] [address]    Report Status PENDING  Incomplete  Resp Panel by RT-PCR (Flu A&B, Covid) Nasopharyngeal Swab     Status: Abnormal   Collection Time: 01/31/21  4:35 AM   Specimen: Nasopharyngeal Swab; Nasopharyngeal(NP) swabs in vial transport medium  Result Value Ref Range Status   SARS Coronavirus 2 by RT PCR POSITIVE (A) NEGATIVE Final    Comment: (NOTE) SARS-CoV-2 target nucleic acids are DETECTED.  The SARS-CoV-2 RNA is generally detectable in upper respiratory specimens during the acute phase of infection. Positive results are indicative of the presence of the identified virus, but do not rule out bacterial infection or co-infection with other pathogens not detected by the test. Clinical correlation with patient  history and other diagnostic information is necessary to determine patient infection status. The expected result is Negative.  Fact Sheet for Patients: EntrepreneurPulse.com.au  Fact Sheet for Healthcare Providers: IncredibleEmployment.be  This test is not yet approved or cleared by the [place] FDA and  has been authorized for detection and/or diagnosis of SARS-CoV-2 by FDA under an Emergency Use Authorization (EUA).  This EUA will remain in effect (meaning this test can be used) for the duration of  the COVID-19 declaration under  Section 564(b)(1) of the A ct, 21 U.S.C. section 360bbb-3(b)(1), unless the authorization is terminated or revoked sooner.     Influenza A by PCR NEGATIVE NEGATIVE Final   Influenza B by PCR NEGATIVE NEGATIVE Final    Comment: (NOTE) The Xpert Xpress SARS-CoV-2/FLU/RSV plus assay is intended as an aid in the diagnosis of influenza from Nasopharyngeal swab specimens and should not be used as a sole basis for treatment. Nasal washings and aspirates are unacceptable for Xpert Xpress SARS-CoV-2/FLU/RSV testing.  Fact Sheet for Patients: EntrepreneurPulse.com.au  Fact Sheet for Healthcare Providers: IncredibleEmployment.be  This test is not yet approved or cleared by the [place] FDA and has been authorized for detection and/or diagnosis of SARS-CoV-2 by FDA under an Emergency Use Authorization (EUA). This EUA will remain in effect (meaning this test can be used) for the duration of the COVID-19 declaration under Section 564(b)(1) of the Act, 21 U.S.C. section 360bbb-3(b)(1), unless the authorization is terminated or revoked.  Performed at [facility], [address] [place],  [address]      Labs: BNP (last 3 results) No results for input(s): BNP in the last 8760 hours. Basic Metabolic Panel: Recent Labs  Lab 01/31/21 0435 02/01/21 0701  02/02/21 0917  NA 137 137 141  K 3.8 4.1 4.3  CL 105 104 105  CO2 26 25 30   GLUCOSE 107* 143* 121*  BUN 24* 18 25*  CREATININE 1.30* 0.79 0.85  CALCIUM 8.1* 8.7* 8.9  MG 1.8  --   --    Liver Function Tests: Recent Labs  Lab 01/31/21 0435 02/01/21 0701 02/02/21 0917  AST 19 24 23   ALT 15 20 20   ALKPHOS 117 101 113  BILITOT 0.4 0.3 0.6  PROT 6.0* 6.2* 6.9  ALBUMIN 3.1* 2.9* 3.4*   No results for input(s): LIPASE, AMYLASE in the last 168 hours. No results for input(s): AMMONIA in the last 168 hours. CBC: Recent Labs  Lab 01/31/21 0435 02/01/21 0701 02/02/21 0917  WBC 12.1* 16.8* 15.2*  NEUTROABS 10.5* 15.5*  --   HGB 11.5* 11.4* 12.1  HCT 35.6* 34.5* 37.6  MCV 94.2 92.7 93.3  PLT 230 250 281   Cardiac Enzymes: No results for input(s): CKTOTAL, CKMB, CKMBINDEX, TROPONINI in the last 168 hours. BNP: Invalid input(s): POCBNP CBG: Recent Labs  Lab 01/31/21 0530  GLUCAP 113*   D-Dimer No results for input(s): DDIMER in the last 72 hours. Hgb A1c No results for input(s): HGBA1C in the last 72 hours. Lipid Profile No results for input(s): CHOL, HDL, LDLCALC, TRIG, CHOLHDL, LDLDIRECT in the last 72 hours. Thyroid function studies No results for input(s): TSH, T4TOTAL, T3FREE, THYROIDAB in the last 72 hours.  Invalid input(s): FREET3 Anemia work up No results for input(s): VITAMINB12, FOLATE, FERRITIN, TIBC, IRON, RETICCTPCT in the last 72 hours. Urinalysis    Component Value Date/Time   COLORURINE YELLOW (A) 01/31/2021 0435   APPEARANCEUR HAZY (A) 01/31/2021 0435   APPEARANCEUR Clear 10/12/2017 0959   LABSPEC 1.023 01/31/2021 0435   PHURINE 5.0 01/31/2021 0435   GLUCOSEU NEGATIVE 01/31/2021 0435   HGBUR NEGATIVE 01/31/2021 0435   BILIRUBINUR NEGATIVE 01/31/2021 0435   BILIRUBINUR Negative 10/12/2017 [place] 01/31/2021 0435   PROTEINUR NEGATIVE 01/31/2021 0435   UROBILINOGEN 0.2 08/01/2014 1112   NITRITE NEGATIVE 01/31/2021 0435    LEUKOCYTESUR NEGATIVE 01/31/2021 0435   Sepsis Labs Invalid input(s): PROCALCITONIN,  WBC,  LACTICIDVEN Microbiology Recent Results (from the past 240 hour(s))  Urine Culture  Status: Abnormal   Collection Time: 01/31/21  4:35 AM   Specimen: Urine, Clean Catch  Result Value Ref Range Status   Specimen Description   Final    URINE, CLEAN CATCH Performed at [facility], [address] [place], [place] [address]    Special Requests   Final    NONE Performed at [facility], [address] [place], [place] [address]    Culture MULTIPLE SPECIES PRESENT, SUGGEST RECOLLECTION (A)  Final   Report Status 02/01/2021 FINAL  Final  Culture, blood (Routine X 2) w Reflex to ID Panel     Status: None (Preliminary result)   Collection Time: 01/31/21  4:35 AM   Specimen: BLOOD  Result Value Ref Range Status   Specimen Description BLOOD LEFT ANTECUBITAL  Final   Special Requests   Final    BOTTLES DRAWN AEROBIC AND ANAEROBIC Blood Culture results may not be optimal due to an inadequate volume of blood received in culture bottles   Culture   Final    NO GROWTH 1 DAY Performed at [facility], [address] [place], [place] [address]    Report Status PENDING  Incomplete  Culture, blood (Routine X 2) w Reflex to ID Panel     Status: None (Preliminary result)   Collection Time: 01/31/21  4:35 AM   Specimen: BLOOD  Result Value Ref Range Status   Specimen Description BLOOD RIGHT ANTECUBITAL  Final   Special Requests   Final    BOTTLES DRAWN AEROBIC AND ANAEROBIC Blood Culture adequate volume   Culture   Final    NO GROWTH 1 DAY Performed at [facility], [address] [place],  [address]    Report Status PENDING  Incomplete  Resp Panel by RT-PCR (Flu A&B, Covid) Nasopharyngeal Swab     Status: Abnormal   Collection Time: 01/31/21  4:35 AM   Specimen: Nasopharyngeal Swab; Nasopharyngeal(NP) swabs in vial transport medium  Result Value  Ref Range Status   SARS Coronavirus 2 by RT PCR POSITIVE (A) NEGATIVE Final    Comment: (NOTE) SARS-CoV-2 target nucleic acids are DETECTED.  The SARS-CoV-2 RNA is generally detectable in upper respiratory specimens during the acute phase of infection. Positive results are indicative of the presence of the identified virus, but do not rule out bacterial infection or co-infection with other pathogens not detected by the test. Clinical correlation with patient history and other diagnostic information is necessary to determine patient infection status. The expected result is Negative.  Fact Sheet for Patients: EntrepreneurPulse.com.au  Fact Sheet for Healthcare Providers: IncredibleEmployment.be  This test is not yet approved or cleared by the [place] FDA and  has been authorized for detection and/or diagnosis of SARS-CoV-2 by FDA under an Emergency Use Authorization (EUA).  This EUA will remain in effect (meaning this test can be used) for the duration of  the COVID-19 declaration under Section 564(b)(1) of the A ct, 21 U.S.C. section 360bbb-3(b)(1), unless the authorization is terminated or revoked sooner.     Influenza A by PCR NEGATIVE NEGATIVE Final   Influenza B by PCR NEGATIVE NEGATIVE Final    Comment: (NOTE) The Xpert Xpress SARS-CoV-2/FLU/RSV plus assay is intended as an aid in the diagnosis of influenza from Nasopharyngeal swab specimens and should not be used as a sole basis for treatment. Nasal washings and aspirates are unacceptable for Xpert Xpress SARS-CoV-2/FLU/RSV testing.  Fact Sheet for Patients: EntrepreneurPulse.com.au  Fact Sheet for Healthcare Providers: IncredibleEmployment.be  This  test is not yet approved or cleared by the [place] and has been authorized for detection and/or diagnosis of SARS-CoV-2 by FDA under an Emergency Use Authorization (EUA). This EUA will  remain in effect (meaning this test can be used) for the duration of the COVID-19 declaration under Section 564(b)(1) of the Act, 21 U.S.C. section 360bbb-3(b)(1), unless the authorization is terminated or revoked.  Performed at [facility], [address] [place], [place] [address]     Time coordinating discharge: Over 30 minutes  SIGNED:  Lorella Nimrod, MD  [facility] 02/02/2021, 1:44 PM  If 7PM-7AM, please contact night-coverage [email or web address]  This record has been created using Systems analyst. Errors have been sought and corrected,but may not always be located. Such creation errors do not reflect on the standard of care.

## 2021-02-03 ENCOUNTER — Telehealth: Payer: Self-pay

## 2021-02-03 NOTE — Telephone Encounter (Signed)
Transition Care Management Unsuccessful Follow-up Telephone Call  Date of discharge and from where:  02/02/21 [facility]  Attempts:  1st Attempt  Reason for unsuccessful TCM follow-up call:  Left voice message

## 2021-02-05 ENCOUNTER — Encounter: Payer: Self-pay | Admitting: Cardiology

## 2021-02-05 LAB — CULTURE, BLOOD (ROUTINE X 2)
Culture: NO GROWTH
Culture: NO GROWTH
Special Requests: ADEQUATE

## 2021-02-05 NOTE — Telephone Encounter (Signed)
Transition Care Management Follow-up Telephone Call Date of discharge and from where: 02/02/21 How have you been since you were released from the hospital? Pt states she is feeling better but still coughing and has drainage Any questions or concerns? No  Items Reviewed: Did the pt receive and understand the discharge instructions provided? Yes  Medications obtained and verified? Yes  Other? No  Any new allergies since your discharge? No  Dietary orders reviewed? Yes Do you have support at home? Yes   Home Care and Equipment/Supplies: Were home health services ordered? no    Were any new equipment or medical supplies ordered?  No   Functional Questionnaire: (I = Independent and D = Dependent) ADLs: I  Bathing/Dressing- I  Meal Prep- I  Eating- I  Maintaining continence- I  Transferring/Ambulation- I  Managing Meds- I  Follow up appointments reviewed:  PCP Hospital f/u appt confirmed? No  Pt declined PCP follow up at this time. [facility] f/u appt confirmed? No  awaiting appt.  Are transportation arrangements needed? No  If their condition worsens, is the pt aware to call PCP or go to the Emergency Dept.? Yes Was the patient provided with contact information for the PCP's office or ED? Yes Was to pt encouraged to call back with questions or concerns? Yes

## 2021-02-05 NOTE — Telephone Encounter (Signed)
Pt called Sonya Lopez back saying that she is getting better.  That was all she stated.  And that she was returning kasey's call.

## 2021-02-20 ENCOUNTER — Other Ambulatory Visit: Payer: Self-pay | Admitting: Family Medicine

## 2021-02-20 DIAGNOSIS — K219 Gastro-esophageal reflux disease without esophagitis: Secondary | ICD-10-CM

## 2021-03-07 ENCOUNTER — Other Ambulatory Visit: Payer: Self-pay | Admitting: Family Medicine

## 2021-03-07 DIAGNOSIS — M4722 Other spondylosis with radiculopathy, cervical region: Secondary | ICD-10-CM

## 2021-03-07 DIAGNOSIS — M5432 Sciatica, left side: Secondary | ICD-10-CM

## 2021-03-12 NOTE — Progress Notes (Signed)
Name: Sonya Lopez   MRN: [id number]    DOB: 23-Oct-1955   Date:03/13/2021       Progress Note  Subjective  Chief Complaint  Follow Up  HPI  OSA: Dr. Jaynee Eagles ordered sleep study 07/2018, showed mild OSA, but had hypoxia of 82% and was seeing a pulmonologist but last visit was last year. She  is compliant with CPAP, no longer waking up frequently with headaches, sleeps better at night and wakes up feeling refreshed . Unchanged    FMS: She states that Cymbalta and Gabapentin seems to be helping with symptoms, she still has mental fogginess. She is currently getting chiropractor care    Bilateral hip pain: seen by [facility] and had  PT ,she also has bursitis, she is now seeing Ortho at [facility] and is having PT with [facility] , she is currently going to [facility] and is doing better  Paroxysmal Afib:  she had syncope twice on April 4th, 2020 Dr. Rockey Situ saw her on April 8th, 2020 tried medication but did not control symptoms, seen by Dr. Shonna Chock, she had ablation and is feeling better, still take Metoprolol daily and Eliquis, prn cardizem    Migraine Headaches: since started on Ajovy migraines has been controlled, she states migraines were under control, episodes about three times months but responds to medication most of the time.    Major Depression  Remission : she is doing better now, Phq 9 is negative , her daughter is getting married in July. She is taking Duloxetine 40 mg daily and is under control    History diabetes  : last abnormal A1C was many years ago. She had labs done in May and A1C was 5.9 % , on diet only, we will remove diagnosis of diabetes to history of diabetes and monitor She denies polyphagia, no polyuria or polydipsia.     GERD: under control with medication, discussed long term medication use.    History of left breast cancer: she had lumpectomy, could not tolerate estrogen suppressive therapy. mammogram is up to date -Dec 2022 , she is seeing  Dr. Fleet Contras .   Morbid Obesity: BMI above 35, with co-morbidities such as GERD , OSA, OA. She had COVID-19 in January lost some weigh initially but weight back to baseline now. Albumin dropped to 2.9 during hospital stay, explained that is malnutrition and we will recheck it today    DDD cervical spine: She had right upper extremity radiculitis, she has cervical fusion 10/2019 and noticing some left side neck pain again  History of COVID-19: she had syncopal episode on 01/31/2021 and was admitted to [facility]. CRP was high but improved prior to discharge, albumin dropped to 2.9 and she also was anemia, and WBC was elevated. She is feeling well now, no symptoms of long haul COVID-19  Patient Active Problem List   Diagnosis Date Noted   SIRS (systemic inflammatory response syndrome) ([place]) 01/31/2021   Acute respiratory disease due to COVID-19 virus 01/31/2021   Acute respiratory failure with hypoxia ([place]) 01/31/2021   Diabetes mellitus without complication ([place]) 34/19/3790   AKI (acute kidney injury) ([place]) 01/31/2021   Hypotension 01/31/2021   History of diabetes mellitus, type II 09/11/2019   OSA (obstructive sleep apnea) 03/27/2019   Syncope 05/10/2018   Pain, elbow joint 02/02/2018   PAF (paroxysmal atrial fibrillation) ([place]) 09/13/2017   Trochanteric bursitis of left hip 06/24/2017   Low back pain 02/22/2017   Benign breast cyst in female, right 05/25/2016  History of left breast cancer 09/16/2015   Long-term use of high-risk medication 02/25/2015   Mild major depression ([place]) 07/29/2014   Gastric reflux 07/29/2014   Chronic interstitial cystitis 07/29/2014   Calculus of kidney 07/29/2014   Allergic rhinitis 07/29/2014   Acne erythematosa 07/29/2014   Lumbosacral radiculitis 07/29/2014   Tinnitus 07/29/2014   Mixed incontinence, urge and stress (female) (female) 08/23/2013   Hyperlipidemia 04/25/2013   H/O: hysterectomy 11/30/2011   Atrial flutter ([place]) 05/03/2011   Bladder  cystocele 01/05/2011   Migraine without aura and without status migrainosus, not intractable 12/29/2010   Fibromyalgia 12/29/2010   Obesity 12/29/2010    Past Surgical History:  Procedure Laterality Date   ABDOMINAL HYSTERECTOMY  2013   ANTERIOR AND POSTERIOR REPAIR  11/30/2011   Procedure: ANTERIOR (CYSTOCELE) AND POSTERIOR REPAIR (RECTOCELE);  Surgeon: Reece Packer, MD;  Location: [facility];  Service: Urology;  Laterality: N/A;  with cysto   ANTERIOR CERVICAL DECOMP/DISCECTOMY FUSION N/A 10/24/2019   Procedure: ANTERIOR CERVICAL DECOMPRESSION/DISCECTOMY FUSION 2 LEVELS C5-7;  Surgeon: Meade Maw, MD;  Location: [facility];  Service: Neurosurgery;  Laterality: N/A;   ATRIAL FIBRILLATION ABLATION N/A 03/01/2019   Procedure: ATRIAL FIBRILLATION ABLATION;  Surgeon: Constance Haw, MD;  Location: [facility];  Service: Cardiovascular;  Laterality: N/A;   BLADDER SUSPENSION  11/30/2011   Procedure: [place] PROCEDURE;  Surgeon: Reece Packer, MD;  Location: [facility];  Service: Urology;  Laterality: N/A;  graft 10x6   BREAST BIOPSY Right 2007   negative core   BREAST BIOPSY Left 09/16/2015   DCIS   BREAST LUMPECTOMY Left 09/30/2015   DCIS clear margins and rad tx/HIGH GRADE DUCTAL CARCINOMA IN SITU, COMEDO TYPE   BREAST LUMPECTOMY WITH NEEDLE LOCALIZATION Left 09/30/2015   DCIS excision, MammoSite radiation;  Surgeon: Robert Bellow, MD;  Location: [facility];  Service: General;  Laterality: Left;   CATARACT EXTRACTION Left 01/2019   CESAREAN SECTION     x 1   CHOLECYSTECTOMY  2005   COLONOSCOPY  2009   CYSTOSCOPY  11/30/2011   Procedure: CYSTOSCOPY;  Surgeon: Reece Packer, MD;  Location: [facility];  Service: Urology;;   [place] OF UTERUS  05/05/2007   GALLBLADDER SURGERY  2004   TONSILLECTOMY  1961   VAGINAL HYSTERECTOMY  11/30/2011   Procedure: HYSTERECTOMY VAGINAL;  Surgeon: Emily Filbert, MD;  Location: [facility];  Service: Gynecology;  Laterality:  N/A;    Family History  Problem Relation Age of Onset   Diabetes Mother    Hearing loss Mother    Hypertension Mother    Stroke Mother    Kidney disease Mother    Sleep apnea Mother    Obesity Mother    Heart disease Father    Heart failure Father    Stroke Father    Depression Father    Heart disease Paternal Grandfather    Asthma Daughter    Asthma Daughter    Migraines Daughter    Fibromyalgia Daughter    Interstitial cystitis Daughter    Asthma Daughter    Colitis Daughter    Thyroid cancer Daughter 46   Diabetes Brother    Hypertension Brother    Breast cancer Neg Hx     Social History   Tobacco Use   Smoking status: Never   Smokeless tobacco: Never  Substance Use Topics   Alcohol use: Not Currently    Alcohol/week: 0.0 standard drinks    Comment: Due to  AF     Current Outpatient Medications:    acetaminophen (TYLENOL) 500 MG tablet, Take 2 tablets (1,000 mg total) by mouth every 8 (eight) hours. OTC, Disp: , Rfl: 0   apixaban (ELIQUIS) 5 MG TABS tablet, Take 1 tablet (5 mg total) by mouth 2 (two) times daily., Disp: 180 tablet, Rfl: 3   bacitracin 500 UNIT/GM ointment, as needed., Disp: , Rfl:    Cholecalciferol (PA VITAMIN D-3 GUMMY PO), Takes 2 gummies daily, Disp: , Rfl:    dextromethorphan-guaiFENesin (MUCINEX DM) 30-600 MG 12hr tablet, Take 1 tablet by mouth 2 (two) times daily as needed for cough., Disp: 30 tablet, Rfl: 0   diltiazem (CARDIZEM) 30 MG tablet, Take 1 tablet (30 mg total) by mouth 4 (four) times daily as needed (atrial fibrillation or elevated heart rates)., Disp: 30 tablet, Rfl: 2   DULoxetine (CYMBALTA) 20 MG capsule, Take 2 capsules (40 mg total) by mouth daily., Disp: 180 capsule, Rfl: 1   ELDERBERRY PO, Take by mouth., Disp: , Rfl:    FINACEA 15 % FOAM, Apply 1 application topically daily as needed (rosacea). , Disp: , Rfl:    gabapentin (NEURONTIN) 300 MG capsule, TAKE 2 CAPSULES BY MOUTH AT BEDTIME, Disp: 180 capsule, Rfl: 1    Galcanezumab-gnlm (EMGALITY) 120 MG/ML SOAJ, Inject 120 mg into the skin every 30 (thirty) days., Disp: 1.12 mL, Rfl: 11   magnesium oxide (MAG-OX) 400 MG tablet, Take 400 mg by mouth every morning. , Disp: , Rfl:    methocarbamol (ROBAXIN) 500 MG tablet, Take 1 tablet (500 mg total) by mouth every 6 (six) hours as needed for muscle spasms., Disp: 90 tablet, Rfl: 0   metoprolol succinate (TOPROL-XL) 25 MG 24 hr tablet, TAKE ONE TABLET (25 MG) BY MOUTH EVERY DAY, Disp: 90 tablet, Rfl: 3   omeprazole (PRILOSEC) 20 MG capsule, TAKE 1 CAPSULE BY MOUTH ONCE DAILY, Disp: 90 capsule, Rfl: 1   Probiotic Product (PROBIOTIC PO), Take 1 capsule by mouth daily. , Disp: , Rfl:    rizatriptan (MAXALT-MLT) 10 MG disintegrating tablet, Take 1 tablet (10 mg total) by mouth as needed for migraine. May repeat in 2 hours if needed, Disp: 27 tablet, Rfl: 4   trolamine salicylate (ASPERCREME) 10 % cream, Apply 1 application topically as needed., Disp: , Rfl:   Allergies  Allergen Reactions   Adhesive [Tape] Dermatitis    SKIN TURNS RED-PAPER TAPE OK TO USE   Claritin [Loratadine] Swelling   Macrodantin [Nitrofurantoin] Nausea And Vomiting   Metronidazole Nausea Only   Nitrofurantoin Macrocrystal Nausea And Vomiting   Sulfamethoxazole-Trimethoprim Nausea Only   Cephalexin Rash   Clindamycin Hcl Rash   Doxycycline Swelling and Rash   Erythromycin Rash    I personally reviewed active problem list, medication list, allergies, family history, social history, health maintenance with the patient/caregiver today.   ROS  Constitutional: Negative for fever or weight change.  Respiratory: Negative for cough and shortness of breath.   Cardiovascular: Negative for chest pain, positive for intermittent palpitations.  Gastrointestinal: Negative for abdominal pain, no bowel changes.  Musculoskeletal: Negative for gait problem or joint swelling.  Skin: Negative for rash.  Neurological: Negative for dizziness , positive  for intermittent headache.  No other specific complaints in a complete review of systems (except as listed in HPI above).   Objective  Vitals:   03/13/21 0758  BP: 118/72  Pulse: 76  Resp: 16  SpO2: 96%  Weight: 228 lb (103.4 kg)  Height: 5\' 6"  (  1.676 m)    Body mass index is 36.8 kg/m.  Physical Exam  Constitutional: Patient appears well-developed and well-nourished. Obese  No distress.  HEENT: head atraumatic, normocephalic, pupils equal and reactive to light, neck supple Cardiovascular: Normal rate, regular rhythm and normal heart sounds.  No murmur heard. No BLE edema. Pulmonary/Chest: Effort normal and breath sounds normal. No respiratory distress. Abdominal: Soft.  There is no tenderness. Psychiatric: Patient has a normal mood and affect. behavior is normal. Judgment and thought content normal.   Recent Results (from the past 2160 hour(s))  CBC with Differential/Platelet     Status: Abnormal   Collection Time: 01/31/21  4:35 AM  Result Value Ref Range   WBC 12.1 (H) 4.0 - 10.5 K/uL   RBC 3.78 (L) 3.87 - 5.11 MIL/uL   Hemoglobin 11.5 (L) 12.0 - 15.0 g/dL   HCT 35.6 (L) 36.0 - 46.0 %   MCV 94.2 80.0 - 100.0 fL   MCH 30.4 26.0 - 34.0 pg   MCHC 32.3 30.0 - 36.0 g/dL   RDW 15.0 11.5 - 15.5 %   Platelets 230 150 - 400 K/uL   nRBC 0.0 0.0 - 0.2 %   Neutrophils Relative % 87 %   Neutro Abs 10.5 (H) 1.7 - 7.7 K/uL   Lymphocytes Relative 6 %   Lymphs Abs 0.7 0.7 - 4.0 K/uL   Monocytes Relative 6 %   Monocytes Absolute 0.7 0.1 - 1.0 K/uL   Eosinophils Relative 0 %   Eosinophils Absolute 0.0 0.0 - 0.5 K/uL   Basophils Relative 0 %   Basophils Absolute 0.0 0.0 - 0.1 K/uL   Immature Granulocytes 1 %   Abs Immature Granulocytes 0.07 0.00 - 0.07 K/uL    Comment: Performed at [facility], [address] [place], [place] [address]  Comprehensive metabolic panel     Status: Abnormal   Collection Time: 01/31/21  4:35 AM  Result Value Ref Range   Sodium 137 135 -  145 mmol/L   Potassium 3.8 3.5 - 5.1 mmol/L   Chloride 105 98 - 111 mmol/L   CO2 26 22 - 32 mmol/L   Glucose, Bld 107 (H) 70 - 99 mg/dL    Comment: Glucose reference range applies only to samples taken after fasting for at least 8 hours.   BUN 24 (H) 8 - 23 mg/dL   Creatinine, Ser 1.30 (H) 0.44 - 1.00 mg/dL   Calcium 8.1 (L) 8.9 - 10.3 mg/dL   Total Protein 6.0 (L) 6.5 - 8.1 g/dL   Albumin 3.1 (L) 3.5 - 5.0 g/dL   AST 19 15 - 41 U/L   ALT 15 0 - 44 U/L   Alkaline Phosphatase 117 38 - 126 U/L   Total Bilirubin 0.4 0.3 - 1.2 mg/dL   GFR, Estimated 45 (L) >60 mL/min    Comment: (NOTE) Calculated using the CKD-EPI Creatinine Equation (2021)    Anion gap 6 5 - 15    Comment: Performed at [facility], [address] [place], [place] [address]  Lactic acid, plasma     Status: None   Collection Time: 01/31/21  4:35 AM  Result Value Ref Range   Lactic Acid, Venous 1.4 0.5 - 1.9 mmol/L    Comment: Performed at [facility], [address] [place],  [address]  Procalcitonin - Baseline     Status: None   Collection Time: 01/31/21  4:35 AM  Result Value Ref Range   Procalcitonin 0.11 ng/mL  Comment:        Interpretation: PCT (Procalcitonin) <= 0.5 ng/mL: Systemic infection (sepsis) is not likely. Local bacterial infection is possible. (NOTE)       Sepsis PCT Algorithm           Lower Respiratory Tract                                      Infection PCT Algorithm    ----------------------------     ----------------------------         PCT < 0.25 ng/mL                PCT < 0.10 ng/mL          Strongly encourage             Strongly discourage   discontinuation of antibiotics    initiation of antibiotics    ----------------------------     -----------------------------       PCT 0.25 - 0.50 ng/mL            PCT 0.10 - 0.25 ng/mL               OR       >80% decrease in PCT            Discourage initiation of                                             antibiotics      Encourage discontinuation           of antibiotics    ----------------------------     -----------------------------         PCT >= 0.50 ng/mL              PCT 0.26 - 0.50 ng/mL               AND        <80% decrease in PCT             Encourage initiation of                                             antibiotics       Encourage continuation           of antibiotics    ----------------------------     -----------------------------        PCT >= 0.50 ng/mL                  PCT > 0.50 ng/mL               AND         increase in PCT                  Strongly encourage                                      initiation of antibiotics    Strongly encourage escalation           of antibiotics                                     -----------------------------  PCT <= 0.25 ng/mL                                                 OR                                        > 80% decrease in PCT                                      Discontinue / Do not initiate                                             antibiotics  Performed at [facility], [address] [place], [place] [address]   Urinalysis, Complete w Microscopic     Status: Abnormal   Collection Time: 01/31/21  4:35 AM  Result Value Ref Range   Color, Urine YELLOW (A) YELLOW   APPearance HAZY (A) CLEAR   Specific Gravity, Urine 1.023 1.005 - 1.030   pH 5.0 5.0 - 8.0   Glucose, UA NEGATIVE NEGATIVE mg/dL   Hgb urine dipstick NEGATIVE NEGATIVE   Bilirubin Urine NEGATIVE NEGATIVE   Ketones, ur NEGATIVE NEGATIVE mg/dL   Protein, ur NEGATIVE NEGATIVE mg/dL   Nitrite NEGATIVE NEGATIVE   Leukocytes,Ua NEGATIVE NEGATIVE   WBC, UA 0-5 0 - 5 WBC/hpf   Bacteria, UA MANY (A) NONE SEEN   Squamous Epithelial / LPF 0-5 0 - 5   Mucus PRESENT    Hyaline Casts, UA PRESENT     Comment: Performed at [facility], [address], [place], [place] [address]  Urine Culture      Status: Abnormal   Collection Time: 01/31/21  4:35 AM   Specimen: Urine, Clean Catch  Result Value Ref Range   Specimen Description      URINE, CLEAN CATCH Performed at [facility], [address] [place], [place] [address]    Special Requests      NONE Performed at [facility], [address] [place], [place] [address]    Culture MULTIPLE SPECIES PRESENT, SUGGEST RECOLLECTION (A)    Report Status 02/01/2021 FINAL   Culture, blood (Routine X 2) w Reflex to ID Panel     Status: None   Collection Time: 01/31/21  4:35 AM   Specimen: BLOOD  Result Value Ref Range   Specimen Description BLOOD LEFT ANTECUBITAL    Special Requests      BOTTLES DRAWN AEROBIC AND ANAEROBIC Blood Culture results may not be optimal due to an inadequate volume of blood received in culture bottles   Culture      NO GROWTH 5 DAYS Performed at [facility], [address] [place], [place] [address]    Report Status 02/05/2021 FINAL   Culture, blood (Routine X 2) w Reflex to ID Panel     Status: None   Collection Time: 01/31/21  4:35 AM   Specimen: BLOOD  Result Value Ref Range   Specimen Description BLOOD RIGHT ANTECUBITAL    Special Requests  BOTTLES DRAWN AEROBIC AND ANAEROBIC Blood Culture adequate volume   Culture      NO GROWTH 5 DAYS Performed at [facility], [address] [place], [place] [address]    Report Status 02/05/2021 FINAL   Resp Panel by RT-PCR (Flu A&B, Covid) Nasopharyngeal Swab     Status: Abnormal   Collection Time: 01/31/21  4:35 AM   Specimen: Nasopharyngeal Swab; Nasopharyngeal(NP) swabs in vial transport medium  Result Value Ref Range   SARS Coronavirus 2 by RT PCR POSITIVE (A) NEGATIVE    Comment: (NOTE) SARS-CoV-2 target nucleic acids are DETECTED.  The SARS-CoV-2 RNA is generally detectable in upper respiratory specimens during the acute phase of infection. Positive results are indicative of the presence of the  identified virus, but do not rule out bacterial infection or co-infection with other pathogens not detected by the test. Clinical correlation with patient history and other diagnostic information is necessary to determine patient infection status. The expected result is Negative.  Fact Sheet for Patients: EntrepreneurPulse.com.au  Fact Sheet for Healthcare Providers: IncredibleEmployment.be  This test is not yet approved or cleared by the [place] FDA and  has been authorized for detection and/or diagnosis of SARS-CoV-2 by FDA under an Emergency Use Authorization (EUA).  This EUA will remain in effect (meaning this test can be used) for the duration of  the COVID-19 declaration under Section 564(b)(1) of the A ct, 21 U.S.C. section 360bbb-3(b)(1), unless the authorization is terminated or revoked sooner.     Influenza A by PCR NEGATIVE NEGATIVE   Influenza B by PCR NEGATIVE NEGATIVE    Comment: (NOTE) The Xpert Xpress SARS-CoV-2/FLU/RSV plus assay is intended as an aid in the diagnosis of influenza from Nasopharyngeal swab specimens and should not be used as a sole basis for treatment. Nasal washings and aspirates are unacceptable for Xpert Xpress SARS-CoV-2/FLU/RSV testing.  Fact Sheet for Patients: EntrepreneurPulse.com.au  Fact Sheet for Healthcare Providers: IncredibleEmployment.be  This test is not yet approved or cleared by the [place] FDA and has been authorized for detection and/or diagnosis of SARS-CoV-2 by FDA under an Emergency Use Authorization (EUA). This EUA will remain in effect (meaning this test can be used) for the duration of the COVID-19 declaration under Section 564(b)(1) of the Act, 21 U.S.C. section 360bbb-3(b)(1), unless the authorization is terminated or revoked.  Performed at [facility], [address] [place], [place] [address]   Magnesium     Status:  None   Collection Time: 01/31/21  4:35 AM  Result Value Ref Range   Magnesium 1.8 1.7 - 2.4 mg/dL    Comment: Performed at [facility], [place], [place] [address]  Troponin I (High Sensitivity)     Status: None   Collection Time: 01/31/21  4:35 AM  Result Value Ref Range   Troponin I (High Sensitivity) 5 <18 ng/L    Comment: (NOTE) Elevated high sensitivity troponin I (hsTnI) values and significant  changes across serial measurements may suggest ACS but many other  chronic and acute conditions are known to elevate hsTnI results.  Refer to the "Links" section for chest pain algorithms and additional  guidance. Performed at [facility], [address] [place], [place] [address]   CBG monitoring, ED     Status: Abnormal   Collection Time: 01/31/21  5:30 AM  Result Value Ref Range   Glucose-Capillary 113 (H) 70 - 99 mg/dL    Comment: Glucose reference range applies only to  samples taken after fasting for at least 8 hours.  HIV Antibody (routine testing w rflx)     Status: None   Collection Time: 01/31/21  1:11 PM  Result Value Ref Range   HIV Screen 4th Generation wRfx Non Reactive Non Reactive    Comment: Performed at [facility], [place] [address], [place], [place] [address]  C-reactive protein     Status: Abnormal   Collection Time: 01/31/21  1:11 PM  Result Value Ref Range   CRP 13.1 (H) <1.0 mg/dL    Comment: Performed at [facility] [address] [place], [place] [address]  CBC with Differential/Platelet     Status: Abnormal   Collection Time: 02/01/21  7:01 AM  Result Value Ref Range   WBC 16.8 (H) 4.0 - 10.5 K/uL   RBC 3.72 (L) 3.87 - 5.11 MIL/uL   Hemoglobin 11.4 (L) 12.0 - 15.0 g/dL   HCT 34.5 (L) 36.0 - 46.0 %   MCV 92.7 80.0 - 100.0 fL   MCH 30.6 26.0 - 34.0 pg   MCHC 33.0 30.0 - 36.0 g/dL   RDW 15.1 11.5 - 15.5 %   Platelets 250 150 - 400 K/uL   nRBC 0.0 0.0 - 0.2 %   Neutrophils Relative % 92 %   Neutro Abs  15.5 (H) 1.7 - 7.7 K/uL   Lymphocytes Relative 5 %   Lymphs Abs 0.9 0.7 - 4.0 K/uL   Monocytes Relative 2 %   Monocytes Absolute 0.3 0.1 - 1.0 K/uL   Eosinophils Relative 0 %   Eosinophils Absolute 0.0 0.0 - 0.5 K/uL   Basophils Relative 0 %   Basophils Absolute 0.0 0.0 - 0.1 K/uL   Immature Granulocytes 1 %   Abs Immature Granulocytes 0.13 (H) 0.00 - 0.07 K/uL    Comment: Performed at [facility], [address] [place], [place] [address]  Comprehensive metabolic panel     Status: Abnormal   Collection Time: 02/01/21  7:01 AM  Result Value Ref Range   Sodium 137 135 - 145 mmol/L   Potassium 4.1 3.5 - 5.1 mmol/L   Chloride 104 98 - 111 mmol/L   CO2 25 22 - 32 mmol/L   Glucose, Bld 143 (H) 70 - 99 mg/dL    Comment: Glucose reference range applies only to samples taken after fasting for at least 8 hours.   BUN 18 8 - 23 mg/dL   Creatinine, Ser 0.79 0.44 - 1.00 mg/dL   Calcium 8.7 (L) 8.9 - 10.3 mg/dL   Total Protein 6.2 (L) 6.5 - 8.1 g/dL   Albumin 2.9 (L) 3.5 - 5.0 g/dL   AST 24 15 - 41 U/L   ALT 20 0 - 44 U/L   Alkaline Phosphatase 101 38 - 126 U/L   Total Bilirubin 0.3 0.3 - 1.2 mg/dL   GFR, Estimated >60 >60 mL/min    Comment: (NOTE) Calculated using the CKD-EPI Creatinine Equation (2021)    Anion gap 8 5 - 15    Comment: Performed at [facility], [address] [place], [place] [address]  C-reactive protein     Status: Abnormal   Collection Time: 02/01/21  7:01 AM  Result Value Ref Range   CRP 12.0 (H) <1.0 mg/dL    Comment: Performed at [facility] [address] [place], [place] [address]  CBC     Status: Abnormal   Collection Time: 02/02/21  9:17 AM  Result Value Ref  Range   WBC 15.2 (H) 4.0 - 10.5 K/uL   RBC 4.03 3.87 - 5.11 MIL/uL   Hemoglobin 12.1 12.0 - 15.0 g/dL   HCT 37.6 36.0 - 46.0 %   MCV 93.3 80.0 - 100.0 fL   MCH 30.0 26.0 - 34.0 pg   MCHC 32.2 30.0 - 36.0 g/dL   RDW 15.4 11.5 - 15.5 %   Platelets 281 150 - 400 K/uL    nRBC 0.0 0.0 - 0.2 %    Comment: Performed at [facility], [address] [place], [place] [address]  Comprehensive metabolic panel     Status: Abnormal   Collection Time: 02/02/21  9:17 AM  Result Value Ref Range   Sodium 141 135 - 145 mmol/L   Potassium 4.3 3.5 - 5.1 mmol/L   Chloride 105 98 - 111 mmol/L   CO2 30 22 - 32 mmol/L   Glucose, Bld 121 (H) 70 - 99 mg/dL    Comment: Glucose reference range applies only to samples taken after fasting for at least 8 hours.   BUN 25 (H) 8 - 23 mg/dL   Creatinine, Ser 0.85 0.44 - 1.00 mg/dL   Calcium 8.9 8.9 - 10.3 mg/dL   Total Protein 6.9 6.5 - 8.1 g/dL   Albumin 3.4 (L) 3.5 - 5.0 g/dL   AST 23 15 - 41 U/L   ALT 20 0 - 44 U/L   Alkaline Phosphatase 113 38 - 126 U/L   Total Bilirubin 0.6 0.3 - 1.2 mg/dL   GFR, Estimated >60 >60 mL/min    Comment: (NOTE) Calculated using the CKD-EPI Creatinine Equation (2021)    Anion gap 6 5 - 15    Comment: Performed at [facility], [address] [place], [place] [address]  C-reactive protein     Status: Abnormal   Collection Time: 02/02/21  9:17 AM  Result Value Ref Range   CRP 5.0 (H) <1.0 mg/dL    Comment: Performed at [facility] [address], [place], [place] [address]     PHQ2/9: Depression screen [facility] 2/9 03/13/2021 09/10/2020 07/01/2020 04/24/2020 03/13/2020  Decreased Interest 0 0 1 0 0  Down, Depressed, Hopeless 1 0 1 0 1  PHQ - 2 Score 1 0 2 0 1  Altered sleeping 0 0 0 1 0  Tired, decreased energy 0 0 3 1 1   Change in appetite 0 0 3 0 0  Feeling bad or failure about yourself  0 0 0 0 1  Trouble concentrating 0 0 2 0 0  Moving slowly or fidgety/restless 0 0 0 0 0  Suicidal thoughts 0 0 0 0 0  PHQ-9 Score 1 0 10 2 3   Difficult doing work/chores - - Not difficult at all - -  Some recent data might be hidden    phq 9 is positive   Fall Risk: Fall Risk  03/13/2021 09/10/2020 04/24/2020 03/13/2020 09/11/2019  Falls in the past year? 1 0 0 0 0  Number  falls in past yr: 0 0 0 0 0  Comment - - - - -  Injury with Fall? 0 0 0 0 0  Comment - - - - -  Risk for fall due to : No Fall Risks - No Fall Risks - -  Risk for fall due to: Comment - - - - -  Follow up Falls prevention discussed - Falls prevention discussed - -      Functional Status Survey: Is the patient  deaf or have difficulty hearing?: No Does the patient have difficulty seeing, even when wearing glasses/contacts?: No Does the patient have difficulty concentrating, remembering, or making decisions?: No Does the patient have difficulty walking or climbing stairs?: No Does the patient have difficulty dressing or bathing?: No Does the patient have difficulty doing errands alone such as visiting a doctor's office or shopping?: No    Assessment & Plan  1. Paroxysmal atrial fibrillation (HCC)  Rate controlled   2. B12 deficiency   3. Fibromyalgia  - DULoxetine (CYMBALTA) 20 MG capsule; Take 2 capsules (40 mg total) by mouth daily.  Dispense: 180 capsule; Refill: 1  4. Major depression in remission ([place])   5. Morbid obesity ([place])  Discussed with the patient the risk posed by an increased BMI. Discussed importance of portion control, calorie counting and at least 150 minutes of physical activity weekly. Avoid sweet beverages and drink more water. Eat at least 6 servings of fruit and vegetables daily    6. History of diabetes mellitus, type II   7. GERD without esophagitis   8. Migraine without aura and without status migrainosus, not intractable   9. OSA (obstructive sleep apnea)   10. Vitamin D deficiency   11. Dyslipidemia   12. Cervical radiculopathy due to degenerative joint disease of spine   13. Malnutrition, calorie ([place])  - COMPLETE METABOLIC PANEL WITH GFR  14. Leukocytosis, unspecified type  - CBC with Differential/Platelet  15. CRP elevated  - C-reactive protein

## 2021-03-13 ENCOUNTER — Encounter: Payer: Self-pay | Admitting: Family Medicine

## 2021-03-13 ENCOUNTER — Other Ambulatory Visit: Payer: Self-pay

## 2021-03-13 ENCOUNTER — Ambulatory Visit (INDEPENDENT_AMBULATORY_CARE_PROVIDER_SITE_OTHER): Payer: Medicare Other | Admitting: Family Medicine

## 2021-03-13 VITALS — BP 118/72 | HR 76 | Resp 16 | Ht 66.0 in | Wt 228.0 lb

## 2021-03-13 DIAGNOSIS — E538 Deficiency of other specified B group vitamins: Secondary | ICD-10-CM

## 2021-03-13 DIAGNOSIS — M4722 Other spondylosis with radiculopathy, cervical region: Secondary | ICD-10-CM

## 2021-03-13 DIAGNOSIS — E559 Vitamin D deficiency, unspecified: Secondary | ICD-10-CM

## 2021-03-13 DIAGNOSIS — G4733 Obstructive sleep apnea (adult) (pediatric): Secondary | ICD-10-CM | POA: Diagnosis not present

## 2021-03-13 DIAGNOSIS — E785 Hyperlipidemia, unspecified: Secondary | ICD-10-CM

## 2021-03-13 DIAGNOSIS — F325 Major depressive disorder, single episode, in full remission: Secondary | ICD-10-CM

## 2021-03-13 DIAGNOSIS — E46 Unspecified protein-calorie malnutrition: Secondary | ICD-10-CM

## 2021-03-13 DIAGNOSIS — D72829 Elevated white blood cell count, unspecified: Secondary | ICD-10-CM | POA: Diagnosis not present

## 2021-03-13 DIAGNOSIS — R7982 Elevated C-reactive protein (CRP): Secondary | ICD-10-CM | POA: Diagnosis not present

## 2021-03-13 DIAGNOSIS — I480 Paroxysmal atrial fibrillation: Secondary | ICD-10-CM | POA: Diagnosis not present

## 2021-03-13 DIAGNOSIS — K219 Gastro-esophageal reflux disease without esophagitis: Secondary | ICD-10-CM

## 2021-03-13 DIAGNOSIS — M797 Fibromyalgia: Secondary | ICD-10-CM

## 2021-03-13 DIAGNOSIS — G43009 Migraine without aura, not intractable, without status migrainosus: Secondary | ICD-10-CM

## 2021-03-13 DIAGNOSIS — Z8639 Personal history of other endocrine, nutritional and metabolic disease: Secondary | ICD-10-CM

## 2021-03-13 MED ORDER — DULOXETINE HCL 20 MG PO CPEP: 40.0000 mg | ORAL_CAPSULE | Freq: Every day | ORAL | 1 refills | Status: DC

## 2021-03-14 LAB — CBC WITH DIFFERENTIAL/PLATELET
Absolute Monocytes: 531 cells/uL (ref 200–950)
Basophils Absolute: 41 cells/uL (ref 0–200)
Basophils Relative: 0.6 %
Eosinophils Absolute: 83 cells/uL (ref 15–500)
Eosinophils Relative: 1.2 %
HCT: 39.5 % (ref 35.0–45.0)
Hemoglobin: 12.8 g/dL (ref 11.7–15.5)
Lymphs Abs: 1435 cells/uL (ref 850–3900)
MCH: 30.2 pg (ref 27.0–33.0)
MCHC: 32.4 g/dL (ref 32.0–36.0)
MCV: 93.2 fL (ref 80.0–100.0)
MPV: 10.4 fL (ref 7.5–12.5)
Monocytes Relative: 7.7 %
Neutro Abs: 4809 cells/uL (ref 1500–7800)
Neutrophils Relative %: 69.7 %
Platelets: 316 10*3/uL (ref 140–400)
RBC: 4.24 10*6/uL (ref 3.80–5.10)
RDW: 14.4 % (ref 11.0–15.0)
Total Lymphocyte: 20.8 %
WBC: 6.9 10*3/uL (ref 3.8–10.8)

## 2021-03-14 LAB — COMPLETE METABOLIC PANEL WITH GFR
AG Ratio: 1.7 (calc) (ref 1.0–2.5)
ALT: 13 U/L (ref 6–29)
AST: 16 U/L (ref 10–35)
Albumin: 4.1 g/dL (ref 3.6–5.1)
Alkaline phosphatase (APISO): 136 U/L (ref 37–153)
BUN: 21 mg/dL (ref 7–25)
CO2: 29 mmol/L (ref 20–32)
Calcium: 9.3 mg/dL (ref 8.6–10.4)
Chloride: 103 mmol/L (ref 98–110)
Creat: 0.89 mg/dL (ref 0.50–1.05)
Globulin: 2.4 g/dL (calc) (ref 1.9–3.7)
Glucose, Bld: 87 mg/dL (ref 65–99)
Potassium: 4.7 mmol/L (ref 3.5–5.3)
Sodium: 140 mmol/L (ref 135–146)
Total Bilirubin: 0.4 mg/dL (ref 0.2–1.2)
Total Protein: 6.5 g/dL (ref 6.1–8.1)
eGFR: 71 mL/min/{1.73_m2} (ref >=60)

## 2021-03-14 LAB — C-REACTIVE PROTEIN: CRP: 17.6 mg/L — ABNORMAL HIGH (ref <8.0)

## 2021-03-26 ENCOUNTER — Encounter: Payer: Self-pay | Admitting: Neurology

## 2021-03-26 ENCOUNTER — Telehealth: Payer: Self-pay

## 2021-03-26 NOTE — Telephone Encounter (Signed)
(  Key: [id number])  Your information has been submitted to [place]. [employer] [place] will review the request and notify you of the determination decision directly, typically within 3 business days of your submission and once all necessary information is received.  You will also receive your request decision electronically. To check for an update later, open the request again from your dashboard.  If [employer] [place] has not responded within the specified timeframe or if you have any questions about your PA submission, contact [place] directly at [facility]) [phone] or ([place]) [phone].

## 2021-03-26 NOTE — Telephone Encounter (Signed)
PA has been approved Message from Plan Effective from 03/26/2021 through 03/26/2022.  Pt notified of approval.

## 2021-03-30 DIAGNOSIS — Z20822 Contact with and (suspected) exposure to covid-19: Secondary | ICD-10-CM | POA: Diagnosis not present

## 2021-04-28 ENCOUNTER — Ambulatory Visit: Payer: Medicare Other

## 2021-05-04 ENCOUNTER — Telehealth (INDEPENDENT_AMBULATORY_CARE_PROVIDER_SITE_OTHER): Payer: Medicare Other | Admitting: Neurology

## 2021-05-04 DIAGNOSIS — R200 Anesthesia of skin: Secondary | ICD-10-CM | POA: Diagnosis not present

## 2021-05-04 DIAGNOSIS — M4722 Other spondylosis with radiculopathy, cervical region: Secondary | ICD-10-CM | POA: Diagnosis not present

## 2021-05-04 DIAGNOSIS — G4730 Sleep apnea, unspecified: Secondary | ICD-10-CM | POA: Diagnosis not present

## 2021-05-04 DIAGNOSIS — G43711 Chronic migraine without aura, intractable, with status migrainosus: Secondary | ICD-10-CM

## 2021-05-04 DIAGNOSIS — I480 Paroxysmal atrial fibrillation: Secondary | ICD-10-CM | POA: Diagnosis not present

## 2021-05-04 DIAGNOSIS — G9782 Other postprocedural complications and disorders of nervous system: Secondary | ICD-10-CM

## 2021-05-04 DIAGNOSIS — M5417 Radiculopathy, lumbosacral region: Secondary | ICD-10-CM | POA: Diagnosis not present

## 2021-05-04 MED ORDER — EMGALITY 120 MG/ML ~~LOC~~ SOAJ: 120.0000 mg | SUBCUTANEOUS | 11 refills | Status: DC

## 2021-05-04 MED ORDER — RIZATRIPTAN BENZOATE 10 MG PO TBDP: 10.0000 mg | ORAL_TABLET | ORAL | 4 refills | Status: DC | PRN

## 2021-05-04 NOTE — Progress Notes (Signed)
?[facility] ? ? ? ?Provider:  Dr Jaynee Eagles ?Requesting Provider: Steele Sizer, MD ?Primary Care Provider:  Steele Sizer, MD ? ?CC:  Back pain, migraines, nack pain ? ?Virtual Visit via Telephone Note ? ?I connected with [name] on 05/04/21 at  8:30 AM EDT by telephone and verified that I am speaking with the correct person using two identifiers. ? ?Location: ?Patient: home ?Provider: office ?  ?I discussed the limitations, risks, security and privacy concerns of performing an evaluation and management service by telephone and the availability of in person appointments. I also discussed with the patient that there may be a patient responsible charge related to this service. The patient expressed understanding and agreed to proceed. ? ? ? ?I discussed the assessment and treatment plan with the patient. The patient was provided an opportunity to ask questions and all were answered. The patient agreed with the plan and demonstrated an understanding of the instructions. ?  ?The patient was advised to call back or seek an in-person evaluation if the symptoms worsen or if the condition fails to improve as anticipated. ? ?I provided over 20 minutes of non-face-to-face time during this encounter. ? ? ?[name], MD ? ?Interval history 05/04/2020:  ? 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 Takes as wonderful. ?Cpap: morning headaches reduced, doing much better ?S/p ACDF: She goes to chiropractor once a week and helps a lot. Numbness at scar is stable, not bothering her ?Afib: under control, stay on eliquis on metoprolol ?Low back: Is fine. She still has sciatica, chiropractor helps, she is walking a lot more 8k-10k steps. She doesn't need injections in her back. ? ?Patient complains of symptoms per HPI as well as the following symptoms: migraines much improved . Pertinent negatives and positives per HPI. All others negative ? ?Interval  history: 10/22/2020 ? ?1.  Migraines, she is doing extremely well, only about 4 mild migraines a month, Maxalt is useful, Tylenol with codeine only when needed her migraines severe or refractory.  We will have to change her from [place] to [employer] due to insurance but she feels these injections have helped her tremendously. ?2.  She is still on her CPAP machine, her morning headaches have significantly reduced ?3.  She had ACDF, she does feel much better, she is going to a chiropractor who is also helping her with her hip pain and overall she feels less pain in general which is excellent.  She still has numbness in the area of the scar but that likely will go away. ?4.  Her A. fib is under control, she states she has only had 2 episodes recently. ?5. Changed to emgality from ajovy and feels it is better ? ? ?Curt Bears is getting married July 21st.  ? ?Update April 17, 2020: Patient was diagnosed with mild obstructive sleep apnea but hypoxia of 82% and is now under the care of the pulmonology groups, she feels improved is using CPAP and is compliant.  Cymbalta and gabapentin seem to be helping with pain symptoms.  She is seeing orthopedics for bilateral hip pain.  She has a history of atrial ablation, she had A. fib and had ablation done on February 20, 2018 she still on Eliquis and metoprolol but has been doing better.  For her migraines controlled since being on Ajovy, but increasing episodes of the past few months.  She is status post cervical fusion and decompression, with improved radiculopathy of the  right arm.  She is being seen by Dr. Ancil Boozer for depression, history of diabetes, obesity.  She has history of B12 deficiency, last B12 in August 2021 was 269 we may consider repeating. ? ?She has been having migraines 2-3 days a month. She tries naratriptan and maxalt, sometimes that helps but sometimes not, she had dry needling.she had it done in PT and her bursitis and sciatic and hip, she loves her therapist Ysidro Evert  Terance Hart) with [facility] in [place]. Tried rizatriptan, naratriptan, sumatriptan and not working will try [place]. And also will try Ajovy every 3 weeks. We discussed fatigue, will order labs. Will do ajovy every 3 weeks.  ? ?Update 10/16/2019: She has been having severe neck pain and radiation. MRi showed broad based disc protrusion c5-c6 causing severe right and moderate left foraminal stenosis, c6-c7 central right paracentral disc protrusion causing right ventral spinal cord abutment (reviewed CD images she brought today). I discussed surgery and agree she needs ACDF. She also has degenrative disease in the lumbar spine but not severe and feeling better in the low back, she is having left-sided sciatica, it comes and goes but doesn't stay. We reviewed her MRI lumbar spine images with arthtritic changes and mild forminal stenosis but no nerve pinching.  ? ?No afib since the ablation. She is going to come off the Eliquis 3 days prior to surgery. ? ?As far as migraines she is doing better, Ajovy alone is helping and the ones she does have a few and they are very responsive to acute medications. Will hold off on botox. Will give her 6 months samples of Ajovy and see if we can get insurance to approve Ajovy if not we will try Emgality.  ? ?HPI 06/2018:  CANDELA KRUL is a lovely 66 y.o. female here as requested by Steele Sizer, MD for migraines. PMHx migraines, lumbar radiculopathy, obesity, kidney stones, fibromyalgia, atrial fibrillation on long-term anticoagulation, diet-controlled diabetes 2, depression, breast neoplasm, anxiety.  Symptoms started in 1993. Bending over in the garden and felt a sharp pain in the left buttocks and hips. Worsening since then. Diagnosed with bursitis and lumbar radic. She always has pain down the left leg. Worse bending over to clean bathtub. Starts in the buttocks and has pain in the buttocks (points to the bursa) but radiates down the back of the leg and tender in the  lateral thigh. She has been to orthopaedics. She had physical therapy which helped the right side which started hurting but the left side never improved. She went back to orthopaedics. It aches all the time. Shooting pain bending over, when she walks a long time, she has sit down after walking a certain distance to rest. Resting and sitting helps, but sitting too long makes it worse. Getting up and walking helps if she doesn't go too far. She has paresthesias in the left lower leg. She also feels weakness in her left leg, her leg will "give out" slightly not enough to fall but weakness and sititing down helps. Weakness worse going up the steps.  ? ?She has left-side pain in the arm as well. She has tightness in the cervical areas. A lot of tightness. She has neck pain but doesn't c/o of radicular pain. And this hurts with the migraines as well. Discussed dry needling.She has tenderness at the base of the ventral palms. No numbness or tingling, no weakness. Worse with typing a lot during the day, no nocturnal awakenings.Consider wrist splints.  ? ?Migraines: She has  a history of migraines. She has a headache 18 days of the month. 8-10 migraine days a month. She uses a triptan which helps. She takes amerge. She sometimes has to take a second. Some it doesn't. Migraines are on the left side, behind the eye sna temple, radiates to the left side of the head and into the left cervical muscles. Migrianes are oderately severe or severe, hurts so badly she can;t keep her eye open, nausea, no vomiting, light and sound sensitivity. movement makes it worse, ice helps. Unilateral, usually on the left but can be on the right. Ongoing at this frequency for > 10 years. Even since the 70s. She has just lived with it. In the last 10 years they are better than they were but still affecting life and can be very bad. She can't move, lay in a dark room. No new vision changes. She wakes up with headache in the morning very often. She has  afib, she snore sometimes, really fatigued, never wakes feeling rested, she wakes frequently, fatigued all day. She was diagnosed with fibromyalgia in 12/2000. She had pressure points, pain, sore, bu

## 2021-05-05 DIAGNOSIS — Z20822 Contact with and (suspected) exposure to covid-19: Secondary | ICD-10-CM | POA: Diagnosis not present

## 2021-05-08 ENCOUNTER — Telehealth: Payer: Self-pay | Admitting: *Deleted

## 2021-05-08 DIAGNOSIS — Z20822 Contact with and (suspected) exposure to covid-19: Secondary | ICD-10-CM | POA: Diagnosis not present

## 2021-05-08 NOTE — Chronic Care Management (AMB) (Signed)
?  Care Management  ? ?Note ? ?05/08/2021 ?Name: Sonya Lopez MRN: [id number] DOB: 02/05/1955 ? ?Sonya Lopez is a 66 y.o. year old female who is a primary care patient of Steele Sizer, MD. I reached out to [name] by phone today offer care coordination services.  ? ?[name] was given information about care management services today including:  ?Care management services include personalized support from designated clinical staff supervised by her physician, including individualized plan of care and coordination with other care providers ?24/7 contact phone numbers for assistance for urgent and routine care needs. ?The patient may stop care management services at any time by phone call to the office staff. ? ?Patient agreed to services and verbal consent obtained.  ? ?Follow up plan: ?Telephone appointment with care management team member scheduled for: 05/13/2021 ? ?Dustan Hyams, CCMA ?Care Guide, Embedded Care Coordination ?[place]  Care Management  ?Direct Dial: [phone] ? ? ?

## 2021-05-13 ENCOUNTER — Ambulatory Visit: Payer: PRIVATE HEALTH INSURANCE

## 2021-05-13 DIAGNOSIS — Z20822 Contact with and (suspected) exposure to covid-19: Secondary | ICD-10-CM | POA: Diagnosis not present

## 2021-05-13 DIAGNOSIS — R051 Acute cough: Secondary | ICD-10-CM | POA: Diagnosis not present

## 2021-05-13 DIAGNOSIS — R059 Cough, unspecified: Secondary | ICD-10-CM | POA: Diagnosis not present

## 2021-05-13 DIAGNOSIS — I480 Paroxysmal atrial fibrillation: Secondary | ICD-10-CM

## 2021-05-13 DIAGNOSIS — E785 Hyperlipidemia, unspecified: Secondary | ICD-10-CM

## 2021-05-13 NOTE — Patient Instructions (Addendum)
Thank you for allowing the Care Management team to participate in your care. It was great speaking with you today! ? ?Patient Care Plan: RN Care Management Plan of Care  ?  ? ?Problem Identified: PAF, Dyslipidemia   ?  ? ?Long-Range Goal: Disease Progression Prevented or Minimized   ?Start Date: 05/13/2021  ?Expected End Date: 08/11/2021  ?Priority: High  ?Note:   ?Current Barriers:  ?Disease Management support and education needs related to Atrial Fibrillation and HLD. ? ?RNCM Clinical Goal(s):  ?Patient will demonstrate ongoing adherence to prescribed treatment plan for Atrial Fibrillation and HLD through collaboration with the provider, RN Care manager and the care team.  ? ?Interventions: ?1:1 collaboration with primary care provider regarding development and update of comprehensive plan of care as evidenced by provider attestation and co-signature ?Inter-disciplinary care team collaboration (see longitudinal plan of care) ?Evaluation of current treatment plan related to  self management and patient's adherence to plan as established by provider ? ? ?AFIB Interventions:  ?AFib action plan reviewed. Reports wearing an Apple watch to monitor episodes. ?Discussed increased risk of stroke due to Afib and benefits of anticoagulation for stroke prevention ?Reviewed importance of adherence to anticoagulant exactly as prescribed ?Reviewed importance of self-monitoring for signs/symptoms of bleeding ?Discussed importance of seeking medical attention after a head injury or if blood is noted in urine/stool. ?Discussed importance of completing appointments with the Cardiology team as scheduled. ? ? ?Hyperlipidemia Interventions:   ?Lab Results  ?Component Value Date  ? CHOL 180 07/01/2020  ? HDL 49 07/01/2020  ? LDLCALC 115 (H) 07/01/2020  ? TRIG 87 07/01/2020  ? CHOLHDL 2.9 09/11/2019  ?  ?Reviewed provider established cholesterol goals. ?Discussed importance of completing regular laboratory monitoring as  prescribed ?Reviewed importance of limiting foods high in cholesterol. Advised to continue reading nutrition labels and avoiding highly processed foods when possible. ?Reviewed exercise goals. Advised to continue engaging in low impact exercises/activities as tolerated. ? ? ?Patient Goals/Self-Care Activities: ?Take all medications as prescribed ?Attend all scheduled provider appointments ?Call pharmacy for medication refills 3-7 days in advance of running out of medications ?Attend church or other social activities ?Perform all self care activities independently  ?Call provider office for new concerns or questions  ? ?PLAN ?Will follow up in three months ?  ?  ? ? ?The patient verbalized understanding of instructions, educational materials, and care plan provided today and declined offer to receive copy of patient instructions, educational materials, and care plan.  ? ?A member of the care management team will follow up in three months. ? ?  ?Shatora Weatherbee,RN ?[place]/[facility] Care Management ?[facility] ?[phone] ? ?

## 2021-05-13 NOTE — Chronic Care Management (AMB) (Signed)
? Care Management ?  ? RN Visit Note ? ?05/13/2021 ?Name: Sonya Lopez MRN: [id number] DOB: 1955-06-30 ? ?Subjective: ?Sonya Lopez is a 66 y.o. year old female who is a primary care patient of Sonya Sizer, MD. The care management team was consulted for assistance with disease management and care coordination needs.   ? ?Engaged with patient by telephone for initial visit in response to provider referral for case management and care coordination services.  ? ?Consent to Services:  ? Sonya Lopez was given information about Care Management services including:  ?Care Management services includes personalized support from designated clinical staff supervised by her physician, including individualized plan of care and coordination with other care providers ?24/7 contact phone numbers for assistance for urgent and routine care needs. ?The patient may stop case management services at any time by phone call to the office staff. ? ?Patient agreed to services and consent obtained.  ? ?Assessment: Review of patient past medical history, allergies, medications, health status, including review of consultants reports, laboratory and other test data, was performed as part of comprehensive evaluation and provision of chronic care management services.  ? ?SDOH (Social Determinants of Health) assessments and interventions performed:  ?SDOH Interventions   ? ?Flowsheet Row Most Recent Value  ?SDOH Interventions   ?Food Insecurity Interventions Intervention Not Indicated  ?Transportation Interventions Intervention Not Indicated  ? ?  ?  ? ?Care Plan ? ?Allergies  ?Allergen Reactions  ? Adhesive [Tape] Dermatitis  ?  SKIN TURNS RED-PAPER TAPE OK TO USE  ? Claritin [Loratadine] Swelling  ? Macrodantin [Nitrofurantoin] Nausea And Vomiting  ? Metronidazole Nausea Only  ? Nitrofurantoin Macrocrystal Nausea And Vomiting  ? Sulfamethoxazole-Trimethoprim Nausea Only  ? Cephalexin Rash  ? Clindamycin Hcl Rash  ? Doxycycline  Swelling and Rash  ? Erythromycin Rash  ? ? ?Outpatient Encounter Medications as of 05/13/2021  ?Medication Sig  ? acetaminophen (TYLENOL) 500 MG tablet Take 2 tablets (1,000 mg total) by mouth every 8 (eight) hours. OTC  ? apixaban (ELIQUIS) 5 MG TABS tablet Take 1 tablet (5 mg total) by mouth 2 (two) times daily.  ? Cholecalciferol (PA VITAMIN D-3 GUMMY PO) Takes 2 gummies daily  ? diltiazem (CARDIZEM) 30 MG tablet Take 1 tablet (30 mg total) by mouth 4 (four) times daily as needed (atrial fibrillation or elevated heart rates).  ? DULoxetine (CYMBALTA) 20 MG capsule Take 2 capsules (40 mg total) by mouth daily.  ? ELDERBERRY PO Take by mouth.  ? FINACEA 15 % FOAM Apply 1 application topically daily as needed (rosacea).   ? gabapentin (NEURONTIN) 300 MG capsule TAKE 2 CAPSULES BY MOUTH AT BEDTIME  ? Galcanezumab-gnlm (EMGALITY) 120 MG/ML SOAJ Inject 120 mg into the skin every 30 (thirty) days.  ? magnesium oxide (MAG-OX) 400 MG tablet Take 400 mg by mouth every morning.   ? methocarbamol (ROBAXIN) 500 MG tablet Take 1 tablet (500 mg total) by mouth every 6 (six) hours as needed for muscle spasms.  ? metoprolol succinate (TOPROL-XL) 25 MG 24 hr tablet TAKE ONE TABLET (25 MG) BY MOUTH EVERY DAY  ? omeprazole (PRILOSEC) 20 MG capsule TAKE 1 CAPSULE BY MOUTH ONCE DAILY  ? Probiotic Product (PROBIOTIC PO) Take 1 capsule by mouth daily.   ? rizatriptan (MAXALT-MLT) 10 MG disintegrating tablet Take 1 tablet (10 mg total) by mouth as needed for migraine. May repeat in 2 hours if needed  ? trolamine salicylate (ASPERCREME) 10 % cream Apply 1 application topically as needed.  ? ?  No facility-administered encounter medications on file as of 05/13/2021.  ? ? ?Patient Active Problem List  ? Diagnosis Date Noted  ? SIRS (systemic inflammatory response syndrome) ([place]) 01/31/2021  ? Acute respiratory disease due to COVID-19 virus 01/31/2021  ? Acute respiratory failure with hypoxia ([place]) 01/31/2021  ? Diabetes mellitus without  complication ([place]) 07/37/1062  ? AKI (acute kidney injury) ([place]) 01/31/2021  ? Hypotension 01/31/2021  ? History of diabetes mellitus, type II 09/11/2019  ? OSA (obstructive sleep apnea) 03/27/2019  ? Syncope 05/10/2018  ? Pain, elbow joint 02/02/2018  ? PAF (paroxysmal atrial fibrillation) ([place]) 09/13/2017  ? Trochanteric bursitis of left hip 06/24/2017  ? Low back pain 02/22/2017  ? Benign breast cyst in female, right 05/25/2016  ? History of left breast cancer 09/16/2015  ? Long-term use of high-risk medication 02/25/2015  ? Mild major depression ([place]) 07/29/2014  ? Gastric reflux 07/29/2014  ? Chronic interstitial cystitis 07/29/2014  ? Calculus of kidney 07/29/2014  ? Allergic rhinitis 07/29/2014  ? Acne erythematosa 07/29/2014  ? Lumbosacral radiculitis 07/29/2014  ? Tinnitus 07/29/2014  ? Mixed incontinence, urge and stress (female) (female) 08/23/2013  ? Hyperlipidemia 04/25/2013  ? H/O: hysterectomy 11/30/2011  ? Atrial flutter ([place]) 05/03/2011  ? Bladder cystocele 01/05/2011  ? Migraine without aura and without status migrainosus, not intractable 12/29/2010  ? Fibromyalgia 12/29/2010  ? Obesity 12/29/2010  ? ? ?Patient Care Plan: RN Care Management Plan of Care  ?  ? ?Problem Identified: PAF, Dyslipidemia   ?  ? ?Long-Range Goal: Disease Progression Prevented or Minimized   ?Start Date: 05/13/2021  ?Expected End Date: 08/11/2021  ?Priority: High  ?Note:   ?Current Barriers:  ?Disease Management support and education needs related to Atrial Fibrillation and HLD. ? ?RNCM Clinical Goal(s):  ?Patient will demonstrate ongoing adherence to prescribed treatment plan for Atrial Fibrillation and HLD through collaboration with the provider, RN Care manager and the care team.  ? ?Interventions: ?1:1 collaboration with primary care provider regarding development and update of comprehensive plan of care as evidenced by provider attestation and co-signature ?Inter-disciplinary care team collaboration (see longitudinal plan  of care) ?Evaluation of current treatment plan related to  self management and patient's adherence to plan as established by provider ? ? ?AFIB Interventions:  ?AFib action plan reviewed. Reports wearing an Apple watch to monitor episodes. ?Discussed increased risk of stroke due to Afib and benefits of anticoagulation for stroke prevention ?Reviewed importance of adherence to anticoagulant exactly as prescribed ?Reviewed importance of self-monitoring for signs/symptoms of bleeding ?Discussed importance of seeking medical attention after a head injury or if blood is noted in urine/stool. ?Discussed importance of completing appointments with the Cardiology team as scheduled. ? ? ?Hyperlipidemia Interventions:   ?Lab Results  ?Component Value Date  ? CHOL 180 07/01/2020  ? HDL 49 07/01/2020  ? LDLCALC 115 (H) 07/01/2020  ? TRIG 87 07/01/2020  ? CHOLHDL 2.9 09/11/2019  ?  ?Reviewed provider established cholesterol goals. ?Discussed importance of completing regular laboratory monitoring as prescribed ?Reviewed importance of limiting foods high in cholesterol. Advised to continue reading nutrition labels and avoiding highly processed foods when possible. ?Reviewed exercise goals. Advised to continue engaging in low impact exercises/activities as tolerated. ? ? ?Patient Goals/Self-Care Activities: ?Take all medications as prescribed ?Attend all scheduled provider appointments ?Call pharmacy for medication refills 3-7 days in advance of running out of medications ?Attend church or other social activities ?Perform all self care activities independently  ?Call provider office for new concerns or  questions  ? ?PLAN ?Will follow up in three months ?  ?  ? ?PLAN ?A member of the care management team will follow up in three months. ? ? ?Elizebath Wever,RN ?[place]/[facility] Care Management ?[facility] ?[phone] ? ? ? ? ? ? ? ? ? ?

## 2021-05-19 ENCOUNTER — Telehealth: Payer: Self-pay | Admitting: Family Medicine

## 2021-05-19 ENCOUNTER — Other Ambulatory Visit: Payer: Self-pay | Admitting: Family Medicine

## 2021-05-19 DIAGNOSIS — F419 Anxiety disorder, unspecified: Secondary | ICD-10-CM

## 2021-05-19 MED ORDER — HYDROXYZINE HCL 10 MG PO TABS: 10.0000 mg | ORAL_TABLET | Freq: Three times a day (TID) | ORAL | 0 refills | Status: DC | PRN

## 2021-05-19 NOTE — Telephone Encounter (Signed)
Sonya Lopez from [place] calling for approval of her ?hydrOXYzine (ATARAX) 10 MG tablet ? From 05/19/2021 to 05/20/2022. ? ?[phone] ?

## 2021-05-19 NOTE — Telephone Encounter (Signed)
PA approved through Cover My Meds, verified with pharmacy and [employer] rep ?

## 2021-06-08 DIAGNOSIS — R051 Acute cough: Secondary | ICD-10-CM | POA: Diagnosis not present

## 2021-06-08 DIAGNOSIS — R059 Cough, unspecified: Secondary | ICD-10-CM | POA: Diagnosis not present

## 2021-06-08 DIAGNOSIS — Z20822 Contact with and (suspected) exposure to covid-19: Secondary | ICD-10-CM | POA: Diagnosis not present

## 2021-06-12 ENCOUNTER — Other Ambulatory Visit: Payer: Self-pay | Admitting: Family Medicine

## 2021-06-12 DIAGNOSIS — M797 Fibromyalgia: Secondary | ICD-10-CM

## 2021-06-16 NOTE — Progress Notes (Signed)
I will call you in a bit ?  ? ? ?Date:  06/17/2021  ? ?ID:  Sonya Lopez, DOB Jul 28, 1955, MRN [id number] ? ?Patient Location:  ?[place] [address]  ? ?Provider location:   ?[place], [employer] office ? ?PCP:  Steele Sizer, MD  ?Cardiologist:  [facility] ? ?Chief Complaint  ?Patient presents with  ? 12 month follow up   ?  "Doing well." Medications reviewed by the patient verbally.   ? ? ?History of Present Illness:   ? ?Sonya Lopez is a 66 y.o. female  past medical history of ?Diabetes  ?GERD ?hospital admission in 04/16/2011 for atrial flutter with RVR.  ?CT coronary calcium score zero 09/2015 ?Father had significant history of coronary artery disease but was a heavy smoker ?A-fib ablation January 2021 ?She presents for routine followup of her atrial fibrillation and flutter ?Ablation 02/2019 ? ?Last seen by myself in clinic May 2021 ?ablation at the end of January 2021 ?Reports that she has done well since that time ?Off amiodarone, tolerating metoprolol succinate 25 daily ? ? on eliquis  5 mg  bid for CHA2DS2VASc of 2.  ? ?in [facility] in [place] from 01/31/2021 until 02/02/2021 with Covid due to cough and syncope ?generalized weakness, cough, sore throat, mild fever and chills. ?hypotensive with blood pressure of 60s over 30s ?Desaturating in high 80s on room air ?CTA negative for PE. ?received Solu-Medrol and 3 doses of remdesivir while in the hospital.  Discharged on 5 more days of prednisone ? ?Recovered, since then no issues ?Very rare episodes of palpitations, only short-lived ?Has not had to take extra diltiazem or extra metoprolol ? ?More active, try to get weight down in preparation for her daughter's wedding July 2023 ?Doing yoga ? ?EKG personally reviewed by myself on todays visit ?Shows sinus bradycardia rate 64 bpm no significant ST-T wave changes ? ? ?Past Medical History:  ?Diagnosis Date  ? Actinic keratosis   ? Allergy   ? Anxiety   ?  situational anxiety  ? Arrhythmia   ? A-fib/A-flutter  ? Atrial flutter ([place])   ? Breast neoplasm, Tis (DCIS), left 10/2015  ? ER 50%, PR 11-50%.Unable to tolerate Tamoxifen or Aromasin.  MammoSite  ? Calculus of kidney   ? Cancer [facility]) 2017  ? Left- DCIS  ? Depression   ? Diet-controlled type 2 diabetes mellitus ([place])   ? Difficult intubation 2009  ? [place] had to use  'boogee" [place]  ? Female cystocele 01/05/2011  ? Fibromyalgia   ? Gallbladder problem   ? GERD (gastroesophageal reflux disease)   ? Headache(784.0)   ? MIGRAINES  ? History of kidney stones   ? IBS (irritable bowel syndrome)   ? Joint pain   ? Obesity   ? OSA (obstructive sleep apnea) 03/27/2019  ? USE CPAP  ? OSA (obstructive sleep apnea)   ? Personal history of radiation therapy 2017  ? LEFT lumpectomy  ? PONV (postoperative nausea and vomiting)   ? h/o difficult intubation and anes had to use "boogee"  ? Vitamin D deficiency   ? Vitreous detachment of right eye   ? sees floaters  ? ?Past Surgical History:  ?Procedure Laterality Date  ? ABDOMINAL HYSTERECTOMY  2013  ? ANTERIOR AND POSTERIOR REPAIR  11/30/2011  ? Procedure: ANTERIOR (CYSTOCELE) AND POSTERIOR REPAIR (RECTOCELE);  Surgeon: Reece Packer, MD;  Location: [facility];  Service: Urology;  Laterality: N/A;  with cysto  ? ANTERIOR CERVICAL DECOMP/DISCECTOMY FUSION N/A 10/24/2019  ? Procedure: ANTERIOR CERVICAL DECOMPRESSION/DISCECTOMY FUSION 2 LEVELS C5-7;  Surgeon: Meade Maw, MD;  Location: [facility];  Service: Neurosurgery;  Laterality: N/A;  ? ATRIAL FIBRILLATION ABLATION N/A 03/01/2019  ? Procedure: ATRIAL FIBRILLATION ABLATION;  Surgeon: Constance Haw, MD;  Location: [facility];  Service: Cardiovascular;  Laterality: N/A;  ? BLADDER SUSPENSION  11/30/2011  ? Procedure: [place] PROCEDURE;  Surgeon: Reece Packer, MD;  Location: [facility];  Service: Urology;  Laterality: N/A;  graft 10x6  ? BREAST  BIOPSY Right 2007  ? negative core  ? BREAST BIOPSY Left 09/16/2015  ? DCIS  ? BREAST LUMPECTOMY Left 09/30/2015  ? DCIS clear margins and rad tx/HIGH GRADE DUCTAL CARCINOMA IN SITU, COMEDO TYPE  ? BREAST LUMPECTOMY WITH NEEDLE LOCALIZATION Left 09/30/2015  ? DCIS excision, MammoSite radiation;  Surgeon: Robert Bellow, MD;  Location: [facility];  Service: General;  Laterality: Left;  ? CATARACT EXTRACTION Left 01/2019  ? CESAREAN SECTION    ? x 1  ? CHOLECYSTECTOMY  2005  ? COLONOSCOPY  2009  ? CYSTOSCOPY  11/30/2011  ? Procedure: CYSTOSCOPY;  Surgeon: Reece Packer, MD;  Location: [facility];  Service: Urology;;  ? [place] OF UTERUS  05/05/2007  ? GALLBLADDER SURGERY  2004  ? TONSILLECTOMY  1961  ? VAGINAL HYSTERECTOMY  11/30/2011  ? Procedure: HYSTERECTOMY VAGINAL;  Surgeon: Emily Filbert, MD;  Location: [facility];  Service: Gynecology;  Laterality: N/A;  ?  ? ?Current Meds  ?Medication Sig  ? acetaminophen (TYLENOL) 500 MG tablet Take 2 tablets (1,000 mg total) by mouth every 8 (eight) hours. OTC  ? apixaban (ELIQUIS) 5 MG TABS tablet Take 1 tablet (5 mg total) by mouth 2 (two) times daily.  ? Cholecalciferol (PA VITAMIN D-3 GUMMY PO) Takes 2 gummies daily  ? diltiazem (CARDIZEM) 30 MG tablet Take 1 tablet (30 mg total) by mouth 4 (four) times daily as needed (atrial fibrillation or elevated heart rates).  ? DULoxetine (CYMBALTA) 20 MG capsule TAKE 2 CAPSULES BY MOUTH EVERY DAY  ? ELDERBERRY PO Take by mouth.  ? FINACEA 15 % FOAM Apply 1 application topically daily as needed (rosacea).   ? Galcanezumab-gnlm (EMGALITY) 120 MG/ML SOAJ Inject 120 mg into the skin every 30 (thirty) days.  ? hydrOXYzine (ATARAX) 10 MG tablet Take 1 tablet (10 mg total) by mouth 3 (three) times daily as needed. It causes sedation, take the first dose when you know you can nap if needed  ? methocarbamol (ROBAXIN) 500 MG tablet Take 1 tablet (500 mg total) by mouth every 6 (six) hours as needed for muscle spasms.  ? metoprolol  succinate (TOPROL-XL) 25 MG 24 hr tablet TAKE ONE TABLET (25 MG) BY MOUTH EVERY DAY  ? omeprazole (PRILOSEC) 20 MG capsule TAKE 1 CAPSULE BY MOUTH ONCE DAILY  ? Probiotic Product (PROBIOTIC PO) Take 1 capsule by mouth daily.   ? rizatriptan (MAXALT-MLT) 10 MG disintegrating tablet Take 1 tablet (10 mg total) by mouth as needed for migraine. May repeat in 2 hours if needed  ? trolamine salicylate (ASPERCREME) 10 % cream Apply 1 application topically as needed.  ?  ? ?Allergies:   Adhesive [tape], Claritin [loratadine], Macrodantin [nitrofurantoin], Metronidazole, Nitrofurantoin macrocrystal, Sulfamethoxazole-trimethoprim, Cephalexin, Clindamycin hcl, Doxycycline, and Erythromycin  ? ?Social History  ? ?Tobacco Use  ? Smoking status: Never  ? Smokeless tobacco: Never  ?Vaping Use  ?  Vaping Use: Never used  ?Substance Use Topics  ? Alcohol use: Not Currently  ?  Alcohol/week: 0.0 standard drinks  ?  Comment: Due to AF  ? Drug use: No  ?  ? ? ?Family Hx: ?[name] family history includes Asthma in her daughter, daughter, and daughter; Colitis in her daughter; Depression in her father; Diabetes in her brother and mother; Fibromyalgia in her daughter; Hearing loss in her mother; Heart disease in her father and paternal grandfather; Heart failure in her father; Hypertension in her brother and mother; Interstitial cystitis in her daughter; Kidney disease in her mother; Migraines in her daughter; Obesity in her mother; Sleep apnea in her mother; Stroke in her father and mother; Thyroid cancer (age of onset: 73) in her daughter. There is no history of Breast cancer. ? ?ROS:   ?Please see the history of present illness.    ?Review of Systems  ?Constitutional: Negative.   ?HENT: Negative.    ?Respiratory: Negative.    ?Cardiovascular: Negative.   ?Gastrointestinal: Negative.   ?Musculoskeletal: Negative.   ?Neurological: Negative.   ?Psychiatric/Behavioral: Negative.    ?All other systems reviewed and are negative.   ? ?Labs/Other Tests and Data Reviewed:   ? ?Recent Labs: ?07/01/2020: TSH 1.860 ?01/31/2021: Magnesium 1.8 ?03/13/2021: ALT 13; BUN 21; Creat 0.89; Hemoglobin 12.8; Platelets 316; Potassium 4.7; Sodium 140  ? ?Recen

## 2021-06-17 ENCOUNTER — Encounter: Payer: Self-pay | Admitting: Cardiovascular Disease

## 2021-06-17 ENCOUNTER — Ambulatory Visit (INDEPENDENT_AMBULATORY_CARE_PROVIDER_SITE_OTHER): Payer: Medicare Other | Admitting: Cardiovascular Disease

## 2021-06-17 VITALS — BP 124/76 | HR 64 | Ht 66.0 in | Wt 221.5 lb

## 2021-06-17 DIAGNOSIS — I4892 Unspecified atrial flutter: Secondary | ICD-10-CM | POA: Diagnosis not present

## 2021-06-17 DIAGNOSIS — E782 Mixed hyperlipidemia: Secondary | ICD-10-CM

## 2021-06-17 DIAGNOSIS — G43009 Migraine without aura, not intractable, without status migrainosus: Secondary | ICD-10-CM | POA: Diagnosis not present

## 2021-06-17 DIAGNOSIS — I480 Paroxysmal atrial fibrillation: Secondary | ICD-10-CM | POA: Diagnosis not present

## 2021-06-17 NOTE — Patient Instructions (Signed)
Medication Instructions:  No changes  If you need a refill on your cardiac medications before your next appointment, please call your pharmacy.   Lab work: No new labs needed  Testing/Procedures: No new testing needed  Follow-Up: At [facility], you and your health needs are our priority.  As part of our continuing mission to provide you with exceptional heart care, we have created designated Provider Care Teams.  These Care Teams include your primary Cardiologist (physician) and Advanced Practice Providers (APPs -  Physician Assistants and Nurse Practitioners) who all work together to provide you with the care you need, when you need it.  You will need a follow up appointment in 12 months  Providers on your designated Care Team:   Christopher Berge, NP Ryan Dunn, PA-C Cadence Furth, PA-C  COVID-19 Vaccine Information can be found at: [email or web address] For questions related to vaccine distribution or appointments, please email [email or web address] or call [phone].   

## 2021-06-23 ENCOUNTER — Encounter: Payer: Self-pay | Admitting: Dermatology

## 2021-06-23 ENCOUNTER — Ambulatory Visit (INDEPENDENT_AMBULATORY_CARE_PROVIDER_SITE_OTHER): Payer: Medicare Other

## 2021-06-23 ENCOUNTER — Ambulatory Visit (INDEPENDENT_AMBULATORY_CARE_PROVIDER_SITE_OTHER): Payer: Medicare Other | Admitting: Dermatology

## 2021-06-23 DIAGNOSIS — D2371 Other benign neoplasm of skin of right lower limb, including hip: Secondary | ICD-10-CM | POA: Diagnosis not present

## 2021-06-23 DIAGNOSIS — L821 Other seborrheic keratosis: Secondary | ICD-10-CM | POA: Diagnosis not present

## 2021-06-23 DIAGNOSIS — D2272 Melanocytic nevi of left lower limb, including hip: Secondary | ICD-10-CM | POA: Diagnosis not present

## 2021-06-23 DIAGNOSIS — Z872 Personal history of diseases of the skin and subcutaneous tissue: Secondary | ICD-10-CM

## 2021-06-23 DIAGNOSIS — D2271 Melanocytic nevi of right lower limb, including hip: Secondary | ICD-10-CM | POA: Diagnosis not present

## 2021-06-23 DIAGNOSIS — D229 Melanocytic nevi, unspecified: Secondary | ICD-10-CM | POA: Diagnosis not present

## 2021-06-23 DIAGNOSIS — L814 Other melanin hyperpigmentation: Secondary | ICD-10-CM | POA: Diagnosis not present

## 2021-06-23 DIAGNOSIS — Z0000 Encounter for general adult medical examination without abnormal findings: Secondary | ICD-10-CM | POA: Diagnosis not present

## 2021-06-23 DIAGNOSIS — D1800 Hemangioma unspecified site: Secondary | ICD-10-CM

## 2021-06-23 DIAGNOSIS — L719 Rosacea, unspecified: Secondary | ICD-10-CM | POA: Diagnosis not present

## 2021-06-23 DIAGNOSIS — L578 Other skin changes due to chronic exposure to nonionizing radiation: Secondary | ICD-10-CM | POA: Diagnosis not present

## 2021-06-23 DIAGNOSIS — Z1283 Encounter for screening for malignant neoplasm of skin: Secondary | ICD-10-CM

## 2021-06-23 DIAGNOSIS — L739 Follicular disorder, unspecified: Secondary | ICD-10-CM

## 2021-06-23 NOTE — Patient Instructions (Signed)
Recommend daily broad spectrum sunscreen SPF 30+ to sun-exposed areas, reapply every 2 hours as needed. Call for new or changing lesions.  Staying in the shade or wearing long sleeves, sun glasses (UVA+UVB protection) and wide brim hats (4-inch brim around the entire circumference of the hat) are also recommended for sun protection.     Melanoma ABCDEs  Melanoma is the most dangerous type of skin cancer, and is the leading cause of death from skin disease.  You are more likely to develop melanoma if you: Have light-colored skin, light-colored eyes, or red or blond hair Spend a lot of time in the sun Tan regularly, either outdoors or in a tanning bed Have had blistering sunburns, especially during childhood Have a close family member who has had a melanoma Have atypical moles or large birthmarks  Early detection of melanoma is key since treatment is typically straightforward and cure rates are extremely high if we catch it early.   The first sign of melanoma is often a change in a mole or a new dark spot.  The ABCDE system is a way of remembering the signs of melanoma.  A for asymmetry:  The two halves do not match. B for border:  The edges of the growth are irregular. C for color:  A mixture of colors are present instead of an even brown color. D for diameter:  Melanomas are usually (but not always) greater than 19m - the size of a pencil eraser. E for evolution:  The spot keeps changing in size, shape, and color.  Please check your skin once per month between visits. You can use a small mirror in front and a large mirror behind you to keep an eye on the back side or your body.   If you see any new or changing lesions before your next follow-up, please call to schedule a visit.  Please continue daily skin protection including broad spectrum sunscreen SPF 30+ to sun-exposed areas, reapplying every 2 hours as needed when you're outdoors.   Staying in the shade or wearing long sleeves,  sun glasses (UVA+UVB protection) and wide brim hats (4-inch brim around the entire circumference of the hat) are also recommended for sun protection.    If You Need Anything After Your Visit  If you have any questions or concerns for your doctor, please call our main line at [phone] press option 4 to reach your doctor's medical assistant. If no one answers, please leave a voicemail as directed and we will return your call as soon as possible. Messages left after 4 pm will be answered the following business day.   You may also send [place] message via [place] We typically respond to MyChart messages within 1-2 business days.  For prescription refills, please ask your pharmacy to contact our office. Our fax number is [phone]  If you have an urgent issue when the clinic is closed that cannot wait until the next business day, you can page your doctor at the number below.    Please note that while we do our best to be available for urgent issues outside of office hours, we are not available 24/7.   If you have an urgent issue and are unable to reach [place] you may choose to seek medical care at your doctor's office, retail clinic, urgent care center, or emergency room.  If you have a medical emergency, please immediately call 911 or go to the emergency department.  Pager Numbers  - Dr. KNehemiah Massed [phone] -  Dr. Laurence Ferrari: [phone]  - Dr. Nicole Kindred: [phone]  In the event of inclement weather, please call our main line at [phone] for an update on the status of any delays or closures.  Dermatology Medication Tips: Please keep the boxes that topical medications come in in order to help keep track of the instructions about where and how to use these. Pharmacies typically print the medication instructions only on the boxes and not directly on the medication tubes.   If your medication is too expensive, please contact our office at [phone] option 4 or send [name] a message  through [place].   We are unable to tell what your co-pay for medications will be in advance as this is different depending on your insurance coverage. However, we may be able to find a substitute medication at lower cost or fill out paperwork to get insurance to cover a needed medication.   If a prior authorization is required to get your medication covered by your insurance company, please allow [name] 1-2 business days to complete this process.  Drug prices often vary depending on where the prescription is filled and some pharmacies may offer cheaper prices.  The website www.goodrx.com contains coupons for medications through different pharmacies. The prices here do not account for what the cost may be with help from insurance (it may be cheaper with your insurance), but the website can give you the price if you did not use any insurance.  - You can print the associated coupon and take it with your prescription to the pharmacy.  - You may also stop by our office during regular business hours and pick up a GoodRx coupon card.  - If you need your prescription sent electronically to a different pharmacy, notify our office through [facility] or by phone at [phone] option 4.     Si Usted Necesita Algo Despus de Su Visita  Tambin puede enviarnos un mensaje a travs de Pharmacist, community. Por lo general respondemos a los mensajes de MyChart en el transcurso de 1 a 2 das hbiles.  Para renovar recetas, por favor pida a su farmacia que se ponga en contacto con nuestra oficina. Harland Dingwall de fax es [place] [phone].  Si tiene un asunto urgente cuando la clnica est cerrada y que no puede esperar hasta el siguiente da hbil, puede llamar/localizar a su doctor(a) al nmero que aparece a continuacin.   Por favor, tenga en cuenta que aunque hacemos todo lo posible para estar disponibles para asuntos urgentes fuera del horario de [place], no estamos disponibles las 24 horas del da, los 7 das de  la [place].   Si tiene un problema urgente y no puede comunicarse con nosotros, puede optar por buscar atencin mdica  en el consultorio de su doctor(a), en una clnica privada, en un centro de atencin urgente o en una sala de emergencias.  Si tiene Engineering geologist, por favor llame inmediatamente al 911 o vaya a la sala de emergencias.  Nmeros de bper  - Dr. Nehemiah Massed: [phone]  - Dra. Moye: [phone]  - Dra. Nicole Kindred: [phone]  En caso de inclemencias del [place], por favor llame a Johnsie Kindred principal al [phone] para una actualizacin sobre el [place] de cualquier retraso o cierre.  Consejos para la medicacin en dermatologa: Por favor, guarde las cajas en las que vienen los medicamentos de uso tpico para ayudarle a seguir las instrucciones sobre dnde y cmo usarlos. Las farmacias generalmente imprimen las instrucciones del medicamento slo en las cajas y  no directamente en los tubos del medicamento.   Si su medicamento es muy caro, por favor, pngase en contacto con Zigmund Daniel llamando al [phone] y presione la opcin 4 o envenos un mensaje a travs de Pharmacist, community.   No podemos decirle cul ser su copago por los medicamentos por adelantado ya que esto es diferente dependiendo de la cobertura de su seguro. Sin embargo, es posible que podamos encontrar un medicamento sustituto a Electrical engineer un formulario para que el seguro cubra el medicamento que se considera necesario.   Si se requiere una autorizacin previa para que su compaa de seguros [place] su medicamento, por favor permtanos de 1 a 2 das hbiles para completar este proceso.  Los precios de los medicamentos varan con frecuencia dependiendo del Environmental consultant de dnde se surte la receta y alguna farmacias pueden ofrecer precios ms baratos.  El sitio web www.goodrx.com tiene cupones para medicamentos de Airline pilot. Los precios aqu no tienen en cuenta lo que podra costar con la ayuda  del seguro (puede ser ms barato con su seguro), pero el sitio web puede darle el precio si no utiliz Research scientist (physical sciences).  - Puede imprimir el cupn correspondiente y llevarlo con su receta a la farmacia.  - Tambin puede pasar por nuestra oficina durante el horario de atencin regular y Charity fundraiser una tarjeta de cupones de GoodRx.  - Si necesita que su receta se enve electrnicamente a una farmacia diferente, informe a nuestra oficina a travs de MyChart de [place] o por telfono llamando al [phone] y presione la opcin 4.

## 2021-06-23 NOTE — Progress Notes (Signed)
Subjective:   Sonya Lopez is a 66 y.o. female who presents for Medicare Annual (Subsequent) preventive examination.  Virtual Visit via Telephone Note  I connected with  Sonya Lopez on 06/23/21 at 11:45 AM EDT by telephone and verified that I am speaking with the correct person using two identifiers.  Location: Patient: home Provider: [place] Persons participating in the virtual visit: [place]   I discussed the limitations, risks, security and privacy concerns of performing an evaluation and management service by telephone and the availability of in person appointments. The patient expressed understanding and agreed to proceed.  Interactive audio and video telecommunications were attempted between this nurse and patient, however failed, due to patient having technical difficulties OR patient did not have access to video capability.  We continued and completed visit with audio only.  Some vital signs may be absent or patient reported.   Clemetine Marker, LPN   Review of Systems     Cardiac Risk Factors include: advanced age (>37mn, >>60women);obesity (BMI >30kg/m2);diabetes mellitus     Objective:    There were no vitals filed for this visit. There is no height or weight on file to calculate BMI.     06/23/2021   11:31 AM 01/31/2021    1:00 PM 01/31/2021    6:47 AM 04/24/2020    9:06 AM 12/24/2019    8:49 AM 10/17/2019   10:08 AM 03/01/2019    6:27 AM  Advanced Directives  Does Patient Have a Medical Advance Directive? Yes Yes Yes Yes No Yes Yes  Type of AParamed[place]of [place]Living will [place]Living will H[place]Living will [place]le[place]Living will   HLittle FlockLiving will  Does patient want to make changes to medical advance directive?  No - Patient declined No - Patient declined    No - Patient declined  Copy of HP[place] in Chart? Yes - validated most recent copy scanned in chart (See row information) No - copy requested No - copy requested Yes - validated most recent copy scanned in chart (See row information)   No - copy requested  Would patient like information on creating a medical advance directive?     No - Patient declined      Current Medications (verified) Outpatient Encounter Medications as of 06/23/2021  Medication Sig   acetaminophen (TYLENOL) 500 MG tablet Take 2 tablets (1,000 mg total) by mouth every 8 (eight) hours. OTC   apixaban (ELIQUIS) 5 MG TABS tablet Take 1 tablet (5 mg total) by mouth 2 (two) times daily.   Cholecalciferol (PA VITAMIN D-3 GUMMY PO) Takes 2 gummies daily   diltiazem (CARDIZEM) 30 MG tablet Take 1 tablet (30 mg total) by mouth 4 (four) times daily as needed (atrial fibrillation or elevated heart rates).   ELDERBERRY PO Take by mouth.   FINACEA 15 % FOAM Apply 1 application topically daily as needed (rosacea).    Galcanezumab-gnlm (EMGALITY) 120 MG/ML SOAJ Inject 120 mg into the skin every 30 (thirty) days.   hydrOXYzine (ATARAX) 10 MG tablet Take 1 tablet (10 mg total) by mouth 3 (three) times daily as needed. It causes sedation, take the first dose when you know you can nap if needed   magnesium oxide (MAG-OX) 400 MG tablet Take 400 mg by mouth every morning.   methocarbamol (ROBAXIN) 500 MG tablet Take 1 tablet (500 mg total) by mouth every 6 (six) hours  as needed for muscle spasms.   metoprolol succinate (TOPROL-XL) 25 MG 24 hr tablet TAKE ONE TABLET (25 MG) BY MOUTH EVERY DAY   omeprazole (PRILOSEC) 20 MG capsule TAKE 1 CAPSULE BY MOUTH ONCE DAILY   Probiotic Product (PROBIOTIC PO) Take 1 capsule by mouth daily.    rizatriptan (MAXALT-MLT) 10 MG disintegrating tablet Take 1 tablet (10 mg total) by mouth as needed for migraine. May repeat in 2 hours if needed   trolamine salicylate (ASPERCREME) 10 % cream Apply 1 application topically as needed.   [DISCONTINUED]  DULoxetine (CYMBALTA) 20 MG capsule TAKE 2 CAPSULES BY MOUTH EVERY DAY (Patient not taking: Reported on 06/23/2021)   [DISCONTINUED] gabapentin (NEURONTIN) 300 MG capsule TAKE 2 CAPSULES BY MOUTH AT BEDTIME (Patient not taking: Reported on 06/17/2021)   No facility-administered encounter medications on file as of 06/23/2021.    Allergies (verified) Adhesive [tape], Claritin [loratadine], Macrodantin [nitrofurantoin], Metronidazole, Nitrofurantoin macrocrystal, Sulfamethoxazole-trimethoprim, Cephalexin, Clindamycin hcl, Doxycycline, and Erythromycin   History: Past Medical History:  Diagnosis Date   Actinic keratosis    Allergy    Anxiety    situational anxiety   Arrhythmia    A-fib/A-flutter   Atrial flutter (HCC)    Breast neoplasm, Tis (DCIS), 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  MammoSite   Calculus of kidney    Cancer ([place]) 2017   Left- DCIS   Depression    Diet-controlled type 2 diabetes mellitus ([place])    Difficult intubation 2009   [place] had to use  'boogee" DURING GALLBLADDER SURGERY ONLY-NO PROBLEMS SINCE   Female cystocele 01/05/2011   Fibromyalgia    Gallbladder problem    GERD (gastroesophageal reflux disease)    Headache(784.0)    MIGRAINES   History of kidney stones    IBS (irritable bowel syndrome)    Joint pain    Obesity    OSA (obstructive sleep apnea) 03/27/2019   USE CPAP   OSA (obstructive sleep apnea)    Personal history of radiation therapy 2017   LEFT lumpectomy   PONV (postoperative nausea and vomiting)    h/o difficult intubation and anes had to use "boogee"   Sleep apnea 2020   Vitamin D deficiency    Vitreous detachment of right eye    sees floaters   Past Surgical History:  Procedure Laterality Date   ABDOMINAL HYSTERECTOMY  2013   ANTERIOR AND POSTERIOR REPAIR  11/30/2011   Procedure: ANTERIOR (CYSTOCELE) AND POSTERIOR REPAIR (RECTOCELE);  Surgeon: Reece Packer, MD;   Location: [facility];  Service: Urology;  Laterality: N/A;  with cysto   ANTERIOR CERVICAL DECOMP/DISCECTOMY FUSION N/A 10/24/2019   Procedure: ANTERIOR CERVICAL DECOMPRESSION/DISCECTOMY FUSION 2 LEVELS C5-7;  Surgeon: Meade Maw, MD;  Location: [facility];  Service: Neurosurgery;  Laterality: N/A;   ATRIAL FIBRILLATION ABLATION N/A 03/01/2019   Procedure: ATRIAL FIBRILLATION ABLATION;  Surgeon: Constance Haw, MD;  Location: [facility];  Service: Cardiovascular;  Laterality: N/A;   BLADDER SUSPENSION  11/30/2011   Procedure: [place] PROCEDURE;  Surgeon: Reece Packer, MD;  Location: [facility];  Service: Urology;  Laterality: N/A;  graft 10x6   BREAST BIOPSY Right 2007   negative core   BREAST BIOPSY Left 09/16/2015   DCIS   BREAST LUMPECTOMY Left 09/30/2015   DCIS clear margins and rad tx/HIGH GRADE DUCTAL CARCINOMA IN SITU, COMEDO TYPE   BREAST LUMPECTOMY WITH NEEDLE LOCALIZATION Left 09/30/2015   DCIS excision, MammoSite  radiation;  Surgeon: Robert Bellow, MD;  Location: [facility];  Service: General;  Laterality: Left;   CATARACT EXTRACTION Left 01/2019   CESAREAN SECTION     x 1   CHOLECYSTECTOMY  2005   COLONOSCOPY  2009   CYSTOSCOPY  11/30/2011   Procedure: CYSTOSCOPY;  Surgeon: Reece Packer, MD;  Location: [facility];  Service: Urology;;   [place] OF UTERUS  05/05/2007   GALLBLADDER SURGERY  2004   SPINE SURGERY  2022   TONSILLECTOMY  1961   VAGINAL HYSTERECTOMY  11/30/2011   Procedure: HYSTERECTOMY VAGINAL;  Surgeon: Emily Filbert, MD;  Location: [facility];  Service: Gynecology;  Laterality: N/A;   Family History  Problem Relation Age of Onset   Diabetes Mother    Hearing loss Mother    Hypertension Mother    Stroke Mother    Kidney disease Mother    Sleep apnea Mother    Obesity Mother    Heart disease Father    Heart failure Father    Stroke Father    Depression Father    ADD / ADHD Father    Arthritis Father    Heart disease  Paternal Grandfather    Asthma Daughter    ADD / ADHD Daughter    Asthma Daughter    Migraines Daughter    Fibromyalgia Daughter    Interstitial cystitis Daughter    Asthma Daughter    Colitis Daughter    Thyroid cancer Daughter 8   Diabetes Brother    Hypertension Brother    Breast cancer Neg Hx    Social History   Socioeconomic History   Marital status: Married    Spouse name: Environmental consultant    Number of children: 3   Years of education: 16   Highest education level: Bachelor's degree (e.g., BA, AB, BS)  Occupational History   Occupation: Network engineer  Tobacco Use   Smoking status: Never   Smokeless tobacco: Never  Vaping Use   Vaping Use: Never used  Substance and Sexual Activity   Alcohol use: Not Currently    Comment: Due to AF   Drug use: No   Sexual activity: Yes    Partners: Male    Birth control/protection: Surgical  Other Topics Concern   Not on file  Social History Narrative   Lives at home with husband and one daughter   Right handed   Caffeine: about 1 cup daily   Social Determinants of Health   Financial Resource Strain: Low Risk    Difficulty of Paying Living Expenses: Not hard at all  Food Insecurity: No Food Insecurity   Worried About Charity fundraiser in the Last Year: Never true   Ran Out of Food in the Last Year: Never true  Transportation Needs: No Transportation Needs   Lack of Transportation (Medical): No   Lack of Transportation (Non-Medical): No  Physical Activity: Sufficiently Active   Days of Exercise per Week: 5 days   Minutes of Exercise per Session: 30 min  Stress: No Stress Concern Present   Feeling of Stress : Not at all  Social Connections: Socially Integrated   Frequency of Communication with Friends and Family: More than three times a week   Frequency of Social Gatherings with Friends and Family: More than three times a week   Attends Religious Services: More than 4 times per year   Active Member of [employer] or Organizations: Yes    Attends Archivist Meetings: More than  4 times per year   Marital Status: Married    Tobacco Counseling Counseling given: Not Answered   Clinical Intake:  Pre-visit preparation completed: Yes  Pain : No/denies pain     Nutritional Risks: None Diabetes: Yes CBG done?: No Did pt. bring in CBG monitor from home?: No  How often do you need to have someone help you when you read instructions, pamphlets, or other written materials from your doctor or pharmacy?: 1 - Never  Interpreter Needed?: No  Information entered by :: Clemetine Marker LPN   Activities of Daily Living    06/23/2021   11:32 AM 03/13/2021    7:57 AM  In your present state of health, do you have any difficulty performing the following activities:  Hearing? 0 0  Vision? 0 0  Difficulty concentrating or making decisions? 0 0  Walking or climbing stairs? 0 0  Dressing or bathing? 0 0  Doing errands, shopping? 0 0  Preparing Food and eating ? N   Using the Toilet? N   In the past six months, have you accidently leaked urine? N   Do you have problems with loss of bowel control? N   Managing your Medications? N   Managing your Finances? N   Housekeeping or managing your Housekeeping? N     Patient Care Team: Steele Sizer, MD as PCP - General Rockey Situ Kathlene November, MD as Consulting Physician (Cardiology) [name], MD as Consulting Physician (Neurology) Brendolyn Patty, MD (Dermatology) Constance Haw, MD as Consulting Physician (Cardiology) Neldon Labella, RN as Case Manager  Indicate any recent Medical Services you may have received from other than [facility] providers in the past year (date may be approximate).     Assessment:   This is a routine wellness examination for Sonya Lopez.  Hearing/Vision screen Hearing Screening - Comments:: Pt denies hearing difficulty Vision Screening - Comments:: Vision screenings with Dr. Carmelia Roller  Dietary issues and exercise activities  discussed: Current Exercise Habits: Home exercise routine, Type of exercise: yoga, Time (Minutes): 30, Frequency (Times/Week): 5, Weekly Exercise (Minutes/Week): 150, Intensity: Moderate, Exercise limited by: None identified   Goals Addressed   None    Depression Screen    06/23/2021   11:30 AM 05/13/2021   11:14 AM 03/13/2021    7:57 AM 09/10/2020    8:22 AM 07/01/2020    9:14 AM 04/24/2020    9:04 AM 03/13/2020    9:01 AM  PHQ 2/9 Scores  PHQ - 2 Score 0 0 1 0 2 0 1  PHQ- 9 Score   1 0 '10 2 3    '$ Fall Risk    06/23/2021   11:32 AM 05/13/2021   11:15 AM 03/13/2021    7:57 AM 09/10/2020    8:22 AM 04/24/2020    9:06 AM  Fall Risk   Falls in the past year? 0 0 1 0 0  Number falls in past yr: 0 0 0 0 0  Injury with Fall? 0  0 0 0  Risk for fall due to : No Fall Risks  No Fall Risks  No Fall Risks  Follow up Falls prevention discussed  Falls prevention discussed  Falls prevention discussed    FALL RISK PREVENTION PERTAINING TO THE HOME:  Any stairs in or around the home? Yes  If so, are there any without handrails? No  Home free of loose throw rugs in walkways, pet beds, electrical cords, etc? Yes  Adequate lighting in your home to reduce  risk of falls? Yes   ASSISTIVE DEVICES UTILIZED TO PREVENT FALLS:  Life alert? No  Use of a cane, walker or w/c? No  Grab bars in the bathroom? No  Shower chair or bench in shower? No  Elevated toilet seat or a handicapped toilet? Yes   TIMED UP AND GO:  Was the test performed? No . Telephonic visit.   Cognitive Function: Normal cognitive status assessed by direct observation by this Nurse Health Advisor. No abnormalities found.          04/20/2019    8:26 AM  6CIT Screen  What Year? 0 points  What month? 0 points  What time? 0 points  Count back from 20 0 points  Months in reverse 0 points  Repeat phrase 0 points  Total Score 0 points    Immunizations Immunization History  Administered Date(s) Administered   Fluad  Quad(high Dose 65+) 10/02/2018, 10/22/2020   Influenza Split 10/25/2006, 10/07/2011   Influenza, Quadrivalent, Recombinant, Inj, Pf 11/11/2017   Influenza, Seasonal, Injecte, Preservative Fre 10/09/2010   Influenza-Unspecified 10/23/2013, 11/05/2015, 11/10/2016, 12/10/2019   PFIZER(Purple Top)SARS-COV-2 Vaccination 03/29/2019, 04/19/2019   Pneumococcal Conjugate-13 09/24/2015   Pneumococcal Polysaccharide-23 04/17/2011, 09/01/2018   Tdap 10/25/2006, 02/10/2017   Zoster Recombinat (Shingrix) 10/01/2017, 02/13/2018   Zoster, Live 11/16/2010    TDAP status: Up to date  Flu Vaccine status: Up to date  Pneumococcal vaccine status: Up to date  Covid-19 vaccine status: Completed vaccines  Qualifies for Shingles Vaccine? Yes   Zostavax completed Yes   Shingrix Completed?: Yes  Screening Tests Health Maintenance  Topic Date Due   COVID-19 Vaccine (3 - Pfizer risk series) 05/17/2019   URINE MICROALBUMIN  09/10/2020   INFLUENZA VACCINE  09/01/2021   MAMMOGRAM  12/19/2021   TETANUS/TDAP  02/11/2027   COLONOSCOPY (Pts 45-69yr Insurance coverage will need to be confirmed)  08/12/2027   Pneumonia Vaccine 68 Years old  Completed   DEXA SCAN  Completed   Hepatitis C Screening  Completed   Zoster Vaccines- Shingrix  Completed   HPV VACCINES  Aged Out   Fecal DNA (Cologuard)  Discontinued    Health Maintenance  Health Maintenance Due  Topic Date Due   COVID-19 Vaccine (3 - Pfizer risk series) 05/17/2019   URINE MICROALBUMIN  09/10/2020    Colorectal cancer screening: Type of screening: Colonoscopy. Completed 08/11/20. Repeat every 7 years  Mammogram status: Completed 12/19/20. Repeat every year  Bone Density status: Completed 12/18/19. Results reflect: Bone density results: NORMAL. Repeat every 2 years.  Lung Cancer Screening: (Low Dose CT Chest recommended if Age 68-80years, 30 pack-year currently smoking OR have quit w/in 15years.) does not qualify.  Additional  Screening:  Hepatitis C Screening: does qualify; Completed 07/14/16  Vision Screening: Recommended annual ophthalmology exams for early detection of glaucoma and other disorders of the eye. Is the patient up to date with their annual eye exam?  Yes  Who is the provider or what is the name of the office in which the patient attends annual eye exams? Dr. HLuan Pulling   Dental Screening: Recommended annual dental exams for proper oral hygiene  Community Resource Referral / Chronic Care Management: CRR required this visit?  No   CCM required this visit?  No      Plan:     I have personally reviewed and noted the following in the patient's chart:   Medical and social history Use of alcohol, tobacco or illicit drugs  Current medications and supplements including opioid  prescriptions.  Functional ability and status Nutritional status Physical activity Advanced directives List of other physicians Hospitalizations, surgeries, and ER visits in previous 12 months Vitals Screenings to include cognitive, depression, and falls Referrals and appointments  In addition, I have reviewed and discussed with patient certain preventive protocols, quality metrics, and best practice recommendations. A written personalized care plan for preventive services as well as general preventive health recommendations were provided to patient.     Clemetine Marker, LPN   03/24/2667   Nurse Notes: none

## 2021-06-23 NOTE — Progress Notes (Signed)
Follow-Up Visit   Subjective  Sonya Lopez is a 66 y.o. female who presents for the following: Annual Exam (Skin cancer screening. Full body. Hx of AK's. No Hx of skin cancer. Hx of rosacea. Using Perfect A cream 0.1% at night. Using Start Skin Medicinals Azelaic Acid: 15%, Niacinamide: 2% cr qam).  The patient presents for Total-Body Skin Exam (TBSE) for skin cancer screening and mole check.  The patient has spots, moles and lesions to be evaluated, some may be new or changing and the patient has concerns that these could be cancer.   The following portions of the chart were reviewed this encounter and updated as appropriate:      Review of Systems: No other skin or systemic complaints except as noted in HPI or Assessment and Plan.   Objective  Well appearing patient in no apparent distress; mood and affect are within normal limits.  A full examination was performed including scalp, head, eyes, ears, nose, lips, neck, chest, axillae, abdomen, back, buttocks, bilateral upper extremities, bilateral lower extremities, hands, feet, fingers, toes, fingernails, and toenails. All findings within normal limits unless otherwise noted below.  Right Popliteal Fossa 2.63m medium dark brown macule     Left Thigh - Posterior 1.068mmed dark brown macule  face Mid face erythema with telangiectasias   back Pink follicular papules   Assessment & Plan    Lentigines - Scattered tan macules - Due to sun exposure - Benign-appearing, observe - Recommend daily broad spectrum sunscreen SPF 30+ to sun-exposed areas, reapply every 2 hours as needed. - Call for any changes  Seborrheic Keratoses - Stuck-on, waxy, tan-brown papules and/or plaques  - Benign-appearing - Discussed benign etiology and prognosis. - Observe - Call for any changes  Melanocytic Nevi - Tan-brown and/or pink-flesh-colored symmetric macules and papules - Benign appearing on exam today - Observation - Call  clinic for new or changing moles - Recommend daily use of broad spectrum spf 30+ sunscreen to sun-exposed areas.   Hemangiomas - Red papules - Discussed benign nature - Observe - Call for any changes  Actinic Damage - Chronic condition, secondary to cumulative UV/sun exposure - diffuse scaly erythematous macules with underlying dyspigmentation - Recommend daily broad spectrum sunscreen SPF 30+ to sun-exposed areas, reapply every 2 hours as needed.  - Staying in the shade or wearing long sleeves, sun glasses (UVA+UVB protection) and wide brim hats (4-inch brim around the entire circumference of the hat) are also recommended for sun protection.  - Call for new or changing lesions.  Skin cancer screening performed today.  Dermatofibroma. Right lateral ankle. - Firm pink/brown papulenodule with dimple sign - Benign appearing - Call for any changes   Nevus (2) Right Popliteal Fossa; Left Thigh - Posterior  Benign-appearing.  Stable. Observation.  Call clinic for new or changing lesions.  Recommend daily use of broad spectrum spf 30+ sunscreen to sun-exposed areas.    Rosacea face  Chronic condition with duration or expected duration over one year. Currently well-controlled.   Rosacea is a chronic progressive skin condition usually affecting the face of adults, causing redness and/or acne bumps. It is treatable but not curable. It sometimes affects the eyes (ocular rosacea) as well. It may respond to topical and/or systemic medication and can flare with stress, sun exposure, alcohol, exercise and some foods.  Daily application of broad spectrum spf 30+ sunscreen to face is recommended to reduce flares.  Recommend Elta MD clear tinted daily to face. Continue The Perfect  A qhs as tolerated  Continue Skin Medicinals Azelaic Acid: 15%, Niacinamide: 2% cr qam  Folliculitis back  Due to recent heat/sweating when painting  Patient deferred treatment at this time.    Return in  about 1 year (around 06/24/2022) for TBSE.  I, Emelia Salisbury, CMA, am acting as scribe for Brendolyn Patty, MD.  Documentation: I have reviewed the above documentation for accuracy and completeness, and I agree with the above.  Brendolyn Patty MD

## 2021-06-23 NOTE — Patient Instructions (Signed)
Sonya Lopez , Thank you for taking time to come for your Medicare Wellness Visit. I appreciate your ongoing commitment to your health goals. Please review the following plan we discussed and let me know if I can assist you in the future.   Screening recommendations/referrals: Colonoscopy: done 08/11/20. Repeat 08/2027 Mammogram: done 12/19/20 Bone Density: done 12/18/19 Recommended yearly ophthalmology/optometry visit for glaucoma screening and checkup Recommended yearly dental visit for hygiene and checkup  Vaccinations: Influenza vaccine: done 10/22/20 Pneumococcal vaccine: done 09/01/18 Tdap vaccine: done 02/10/17 Shingles vaccine: done 10/01/17 & 02/13/18   Covid-19:done 03/29/19 & 04/19/19  Conditions/risks identified: Keep up the great work!  Next appointment: Follow up in one year for your annual wellness visit    Preventive Care 65 Years and Older, Female Preventive care refers to lifestyle choices and visits with your health care provider that can promote health and wellness. What does preventive care include? A yearly physical exam. This is also called an annual well check. Dental exams once or twice a year. Routine eye exams. Ask your health care provider how often you should have your eyes checked. Personal lifestyle choices, including: Daily care of your teeth and gums. Regular physical activity. Eating a healthy diet. Avoiding tobacco and drug use. Limiting alcohol use. Practicing safe sex. Taking low-dose aspirin every day. Taking vitamin and mineral supplements as recommended by your health care provider. What happens during an annual well check? The services and screenings done by your health care provider during your annual well check will depend on your age, overall health, lifestyle risk factors, and family history of disease. Counseling  Your health care provider may ask you questions about your: Alcohol use. Tobacco use. Drug use. Emotional  well-being. Home and relationship well-being. Sexual activity. Eating habits. History of falls. Memory and ability to understand (cognition). Work and work Statistician. Reproductive health. Screening  You may have the following tests or measurements: Height, weight, and BMI. Blood pressure. Lipid and cholesterol levels. These may be checked every 5 years, or more frequently if you are over 74 years old. Skin check. Lung cancer screening. You may have this screening every year starting at age 64 if you have a 30-pack-year history of smoking and currently smoke or have quit within the past 15 years. Fecal occult blood test (FOBT) of the stool. You may have this test every year starting at age 17. Flexible sigmoidoscopy or colonoscopy. You may have a sigmoidoscopy every 5 years or a colonoscopy every 10 years starting at age 18. Hepatitis C blood test. Hepatitis B blood test. Sexually transmitted disease (STD) testing. Diabetes screening. This is done by checking your blood sugar (glucose) after you have not eaten for a while (fasting). You may have this done every 1-3 years. Bone density scan. This is done to screen for osteoporosis. You may have this done starting at age 2. Mammogram. This may be done every 1-2 years. Talk to your health care provider about how often you should have regular mammograms. Talk with your health care provider about your test results, treatment options, and if necessary, the need for more tests. Vaccines  Your health care provider may recommend certain vaccines, such as: Influenza vaccine. This is recommended every year. Tetanus, diphtheria, and acellular pertussis (Tdap, Td) vaccine. You may need a Td booster every 10 years. Zoster vaccine. You may need this after age 72. Pneumococcal 13-valent conjugate (PCV13) vaccine. One dose is recommended after age 29. Pneumococcal polysaccharide (PPSV23) vaccine. One dose is recommended after  age 2. Talk to your  health care provider about which screenings and vaccines you need and how often you need them. This information is not intended to replace advice given to you by your health care provider. Make sure you discuss any questions you have with your health care provider. Document Released: 02/14/2015 Document Revised: 10/08/2015 Document Reviewed: 11/19/2014 Elsevier Interactive Patient Education  2017 [place] Prevention in the Home Falls can cause injuries. They can happen to people of all ages. There are many things you can do to make your home safe and to help prevent falls. What can I do on the outside of my home? Regularly fix the edges of walkways and driveways and fix any cracks. Remove anything that might make you trip as you walk through a door, such as a raised step or threshold. Trim any bushes or trees on the path to your home. Use bright outdoor lighting. Clear any walking paths of anything that might make someone trip, such as rocks or tools. Regularly check to see if handrails are loose or broken. Make sure that both sides of any steps have handrails. Any raised decks and porches should have guardrails on the edges. Have any leaves, snow, or ice cleared regularly. Use sand or salt on walking paths during winter. Clean up any spills in your garage right away. This includes oil or grease spills. What can I do in the bathroom? Use night lights. Install grab bars by the toilet and in the tub and shower. Do not use towel bars as grab bars. Use non-skid mats or decals in the tub or shower. If you need to sit down in the shower, use a plastic, non-slip stool. Keep the floor dry. Clean up any water that spills on the floor as soon as it happens. Remove soap buildup in the tub or shower regularly. Attach bath mats securely with double-sided non-slip rug tape. Do not have throw rugs and other things on the floor that can make you trip. What can I do in the bedroom? Use night  lights. Make sure that you have a light by your bed that is easy to reach. Do not use any sheets or blankets that are too big for your bed. They should not hang down onto the floor. Have a firm chair that has side arms. You can use this for support while you get dressed. Do not have throw rugs and other things on the floor that can make you trip. What can I do in the kitchen? Clean up any spills right away. Avoid walking on wet floors. Keep items that you use a lot in easy-to-reach places. If you need to reach something above you, use a strong step stool that has a grab bar. Keep electrical cords out of the way. Do not use floor polish or wax that makes floors slippery. If you must use wax, use non-skid floor wax. Do not have throw rugs and other things on the floor that can make you trip. What can I do with my stairs? Do not leave any items on the stairs. Make sure that there are handrails on both sides of the stairs and use them. Fix handrails that are broken or loose. Make sure that handrails are as long as the stairways. Check any carpeting to make sure that it is firmly attached to the stairs. Fix any carpet that is loose or worn. Avoid having throw rugs at the top or bottom of the stairs. If you do  have throw rugs, attach them to the floor with carpet tape. Make sure that you have a light switch at the top of the stairs and the bottom of the stairs. If you do not have them, ask someone to add them for you. What else can I do to help prevent falls? Wear shoes that: Do not have high heels. Have rubber bottoms. Are comfortable and fit you well. Are closed at the toe. Do not wear sandals. If you use a stepladder: Make sure that it is fully opened. Do not climb a closed stepladder. Make sure that both sides of the stepladder are locked into place. Ask someone to hold it for you, if possible. Clearly mark and make sure that you can see: Any grab bars or handrails. First and last  steps. Where the edge of each step is. Use tools that help you move around (mobility aids) if they are needed. These include: Canes. Walkers. Scooters. Crutches. Turn on the lights when you go into a dark area. Replace any light bulbs as soon as they burn out. Set up your furniture so you have a clear path. Avoid moving your furniture around. If any of your floors are uneven, fix them. If there are any pets around you, be aware of where they are. Review your medicines with your doctor. Some medicines can make you feel dizzy. This can increase your chance of falling. Ask your doctor what other things that you can do to help prevent falls. This information is not intended to replace advice given to you by your health care provider. Make sure you discuss any questions you have with your health care provider. Document Released: 11/14/2008 Document Revised: 06/26/2015 Document Reviewed: 02/22/2014 Elsevier Interactive Patient Education  2017 [employer].

## 2021-08-18 ENCOUNTER — Other Ambulatory Visit: Payer: Self-pay | Admitting: Family Medicine

## 2021-08-18 DIAGNOSIS — K219 Gastro-esophageal reflux disease without esophagitis: Secondary | ICD-10-CM

## 2021-09-09 ENCOUNTER — Encounter (INDEPENDENT_AMBULATORY_CARE_PROVIDER_SITE_OTHER): Payer: Self-pay

## 2021-09-09 NOTE — Progress Notes (Unsigned)
Name: Sonya Lopez   MRN: [id number]    DOB: [birth year]   Date:09/09/2021       Progress Note  Subjective  Chief Complaint  Follow Up  HPI  OSA: Dr. Jaynee Eagles ordered sleep study 07/2018, showed mild OSA, but had hypoxia of 82% and was seeing a pulmonologist but last visit was last year. She  is compliant with CPAP, no longer waking up frequently with headaches, sleeps better at night and wakes up feeling refreshed . Unchanged    FMS: She states that Cymbalta and Gabapentin seems to be helping with symptoms, she still has mental fogginess. She is currently getting chiropractor care    Bilateral hip pain: seen by [facility] and had  PT ,she also has bursitis, she is now seeing Ortho at [facility] and is having PT with [facility] , she is currently going to [facility] and is doing better  Paroxysmal Afib:  she had syncope twice on April 4th, 2020 Dr. Rockey Situ saw her on April 8th, 2020 tried medication but did not control symptoms, seen by Dr. Shonna Chock, she had ablation and is feeling better, still take Metoprolol daily and Eliquis, prn cardizem    Migraine Headaches: since started on Ajovy migraines has been controlled, she states migraines were under control, episodes about three times months but responds to medication most of the time.    Major Depression  Remission : she is doing better now, Phq 9 is negative , her daughter is getting married in July. She is taking Duloxetine 40 mg daily and is under control    History diabetes  : last abnormal A1C was many years ago. She had labs done in May and A1C was 5.9 % , on diet only, we will remove diagnosis of diabetes to history of diabetes and monitor She denies polyphagia, no polyuria or polydipsia.     GERD: under control with medication, discussed long term medication use.    History of left breast cancer: she had lumpectomy, could not tolerate estrogen suppressive therapy. mammogram is up to date -Dec 2022 , she is seeing  Dr. Fleet Contras .   Morbid Obesity: BMI above 35, with co-morbidities such as GERD , OSA, OA. She had COVID-19 in January lost some weigh initially but weight back to baseline now. Albumin dropped to 2.9 during hospital stay, explained that is malnutrition and we will recheck it today    DDD cervical spine: She had right upper extremity radiculitis, she has cervical fusion 10/2019 and noticing some left side neck pain again  History of COVID-19: she had syncopal episode on 01/31/2021 and was admitted to [facility]. CRP was high but improved prior to discharge, albumin dropped to 2.9 and she also was anemia, and WBC was elevated. She is feeling well now, no symptoms of long haul COVID-19  Patient Active Problem List   Diagnosis Date Noted   SIRS (systemic inflammatory response syndrome) ([place]) 01/31/2021   Acute respiratory disease due to COVID-19 virus 01/31/2021   Acute respiratory failure with hypoxia ([place]) 01/31/2021   Diabetes mellitus without complication ([place]) 53/66/4403   AKI (acute kidney injury) ([place]) 01/31/2021   Hypotension 01/31/2021   History of diabetes mellitus, type II 09/11/2019   OSA (obstructive sleep apnea) 03/27/2019   Syncope 05/10/2018   Pain, elbow joint 02/02/2018   PAF (paroxysmal atrial fibrillation) ([place]) 09/13/2017   Trochanteric bursitis of left hip 06/24/2017   Low back pain 02/22/2017   Benign breast cyst in female, right 05/25/2016  History of left breast cancer 09/16/2015   Long-term use of high-risk medication 02/25/2015   Mild major depression ([place]) 07/29/2014   Gastric reflux 07/29/2014   Chronic interstitial cystitis 07/29/2014   Calculus of kidney 07/29/2014   Allergic rhinitis 07/29/2014   Acne erythematosa 07/29/2014   Lumbosacral radiculitis 07/29/2014   Tinnitus 07/29/2014   Mixed incontinence, urge and stress (female) (female) 08/23/2013   Hyperlipidemia 04/25/2013   H/O: hysterectomy 11/30/2011   Atrial flutter ([place]) 05/03/2011   Bladder  cystocele 01/05/2011   Migraine without aura and without status migrainosus, not intractable 12/29/2010   Fibromyalgia 12/29/2010   Obesity 12/29/2010    Past Surgical History:  Procedure Laterality Date   ABDOMINAL HYSTERECTOMY  2013   ANTERIOR AND POSTERIOR REPAIR  11/30/2011   Procedure: ANTERIOR (CYSTOCELE) AND POSTERIOR REPAIR (RECTOCELE);  Surgeon: Reece Packer, MD;  Location: [facility];  Service: Urology;  Laterality: N/A;  with cysto   ANTERIOR CERVICAL DECOMP/DISCECTOMY FUSION N/A 10/24/2019   Procedure: ANTERIOR CERVICAL DECOMPRESSION/DISCECTOMY FUSION 2 LEVELS C5-7;  Surgeon: Meade Maw, MD;  Location: [facility];  Service: Neurosurgery;  Laterality: N/A;   ATRIAL FIBRILLATION ABLATION N/A 03/01/2019   Procedure: ATRIAL FIBRILLATION ABLATION;  Surgeon: Constance Haw, MD;  Location: [facility];  Service: Cardiovascular;  Laterality: N/A;   BLADDER SUSPENSION  11/30/2011   Procedure: [place] PROCEDURE;  Surgeon: Reece Packer, MD;  Location: [facility];  Service: Urology;  Laterality: N/A;  graft 10x6   BREAST BIOPSY Right 2007   negative core   BREAST BIOPSY Left 09/16/2015   DCIS   BREAST LUMPECTOMY Left 09/30/2015   DCIS clear margins and rad tx/HIGH GRADE DUCTAL CARCINOMA IN SITU, COMEDO TYPE   BREAST LUMPECTOMY WITH NEEDLE LOCALIZATION Left 09/30/2015   DCIS excision, MammoSite radiation;  Surgeon: Robert Bellow, MD;  Location: [facility];  Service: General;  Laterality: Left;   CATARACT EXTRACTION Left 01/2019   CESAREAN SECTION     x 1   CHOLECYSTECTOMY  2005   COLONOSCOPY  2009   CYSTOSCOPY  11/30/2011   Procedure: CYSTOSCOPY;  Surgeon: Reece Packer, MD;  Location: [facility];  Service: Urology;;   [place] OF UTERUS  05/05/2007   GALLBLADDER SURGERY  2004   SPINE SURGERY  2022   TONSILLECTOMY  1961   VAGINAL HYSTERECTOMY  11/30/2011   Procedure: HYSTERECTOMY VAGINAL;  Surgeon: Emily Filbert, MD;  Location: [facility];  Service:  Gynecology;  Laterality: N/A;    Family History  Problem Relation Age of Onset   Diabetes Mother    Hearing loss Mother    Hypertension Mother    Stroke Mother    Kidney disease Mother    Sleep apnea Mother    Obesity Mother    Heart disease Father    Heart failure Father    Stroke Father    Depression Father    ADD / ADHD Father    Arthritis Father    Heart disease Paternal Grandfather    Asthma Daughter    ADD / ADHD Daughter    Asthma Daughter    Migraines Daughter    Fibromyalgia Daughter    Interstitial cystitis Daughter    Asthma Daughter    Colitis Daughter    Thyroid cancer Daughter 41   Diabetes Brother    Hypertension Brother    Breast cancer Neg Hx     Social History   Tobacco Use   Smoking status: Never   Smokeless  tobacco: Never  Substance Use Topics   Alcohol use: Not Currently    Comment: Due to AF     Current Outpatient Medications:    acetaminophen (TYLENOL) 500 MG tablet, Take 2 tablets (1,000 mg total) by mouth every 8 (eight) hours. OTC, Disp: , Rfl: 0   apixaban (ELIQUIS) 5 MG TABS tablet, Take 1 tablet (5 mg total) by mouth 2 (two) times daily., Disp: 180 tablet, Rfl: 3   Cholecalciferol (PA VITAMIN D-3 GUMMY PO), Takes 2 gummies daily, Disp: , Rfl:    diltiazem (CARDIZEM) 30 MG tablet, Take 1 tablet (30 mg total) by mouth 4 (four) times daily as needed (atrial fibrillation or elevated heart rates)., Disp: 30 tablet, Rfl: 2   ELDERBERRY PO, Take by mouth., Disp: , Rfl:    FINACEA 15 % FOAM, Apply 1 application topically daily as needed (rosacea). , Disp: , Rfl:    Galcanezumab-gnlm (EMGALITY) 120 MG/ML SOAJ, Inject 120 mg into the skin every 30 (thirty) days., Disp: 1.12 mL, Rfl: 11   hydrOXYzine (ATARAX) 10 MG tablet, Take 1 tablet (10 mg total) by mouth 3 (three) times daily as needed. It causes sedation, take the first dose when you know you can nap if needed (Patient not taking: Reported on 06/23/2021), Disp: 30 tablet, Rfl: 0   magnesium  oxide (MAG-OX) 400 MG tablet, Take 400 mg by mouth every morning., Disp: , Rfl:    methocarbamol (ROBAXIN) 500 MG tablet, Take 1 tablet (500 mg total) by mouth every 6 (six) hours as needed for muscle spasms., Disp: 90 tablet, Rfl: 0   metoprolol succinate (TOPROL-XL) 25 MG 24 hr tablet, TAKE ONE TABLET (25 MG) BY MOUTH EVERY DAY, Disp: 90 tablet, Rfl: 3   omeprazole (PRILOSEC) 20 MG capsule, TAKE 1 CAPSULE BY MOUTH ONCE DAILY, Disp: 90 capsule, Rfl: 0   Probiotic Product (PROBIOTIC PO), Take 1 capsule by mouth daily. , Disp: , Rfl:    rizatriptan (MAXALT-MLT) 10 MG disintegrating tablet, Take 1 tablet (10 mg total) by mouth as needed for migraine. May repeat in 2 hours if needed, Disp: 27 tablet, Rfl: 4   trolamine salicylate (ASPERCREME) 10 % cream, Apply 1 application topically as needed., Disp: , Rfl:   Allergies  Allergen Reactions   Adhesive [Tape] Dermatitis    SKIN TURNS RED-PAPER TAPE OK TO USE   Claritin [Loratadine] Swelling   Macrodantin [Nitrofurantoin] Nausea And Vomiting   Metronidazole Nausea Only   Nitrofurantoin Macrocrystal Nausea And Vomiting   Sulfamethoxazole-Trimethoprim Nausea Only   Cephalexin Rash   Clindamycin Hcl Rash   Doxycycline Swelling and Rash   Erythromycin Rash    I personally reviewed active problem list, medication list, allergies, family history, social history, health maintenance with the patient/caregiver today.   ROS  ***  Objective  There were no vitals filed for this visit.  There is no height or weight on file to calculate BMI.  Physical Exam ***  No results found for this or any previous visit (from the past 2160 hour(s)).  Diabetic Foot Exam: Diabetic Foot Exam - Simple   No data filed    ***  PHQ2/9:    06/23/2021   11:30 AM 05/13/2021   11:14 AM 03/13/2021    7:57 AM 09/10/2020    8:22 AM 07/01/2020    9:14 AM  Depression screen PHQ 2/9  Decreased Interest 0 0 0 0 1  Down, Depressed, Hopeless 0 0 1 0 1  PHQ - 2  Score 0 0  1 0 2  Altered sleeping   0 0 0  Tired, decreased energy   0 0 3  Change in appetite   0 0 3  Feeling bad or failure about yourself    0 0 0  Trouble concentrating   0 0 2  Moving slowly or fidgety/restless   0 0 0  Suicidal thoughts   0 0 0  PHQ-9 Score   1 0 10  Difficult doing work/chores     Not difficult at all    phq 9 is {gen pos [id number]}   Fall Risk:    06/23/2021   11:32 AM 05/13/2021   11:15 AM 03/13/2021    7:57 AM 09/10/2020    8:22 AM 04/24/2020    9:06 AM  [place] in the past year? 0 0 1 0 0  Number falls in past yr: 0 0 0 0 0  Injury with Fall? 0  0 0 0  Risk for fall due to : No Fall Risks  No Fall Risks  No Fall Risks  Follow up Falls prevention discussed  Falls prevention discussed  Falls prevention discussed      Functional Status Survey:      Assessment & Plan  *** There are no diagnoses linked to this encounter.

## 2021-09-10 ENCOUNTER — Encounter: Payer: Self-pay | Admitting: Family Medicine

## 2021-09-10 ENCOUNTER — Ambulatory Visit (INDEPENDENT_AMBULATORY_CARE_PROVIDER_SITE_OTHER): Payer: Medicare Other | Admitting: Family Medicine

## 2021-09-10 ENCOUNTER — Ambulatory Visit: Payer: Self-pay

## 2021-09-10 VITALS — BP 132/66 | HR 76 | Resp 16 | Ht 66.0 in | Wt 219.0 lb

## 2021-09-10 DIAGNOSIS — G4733 Obstructive sleep apnea (adult) (pediatric): Secondary | ICD-10-CM | POA: Diagnosis not present

## 2021-09-10 DIAGNOSIS — M797 Fibromyalgia: Secondary | ICD-10-CM | POA: Diagnosis not present

## 2021-09-10 DIAGNOSIS — I480 Paroxysmal atrial fibrillation: Secondary | ICD-10-CM

## 2021-09-10 DIAGNOSIS — E538 Deficiency of other specified B group vitamins: Secondary | ICD-10-CM

## 2021-09-10 DIAGNOSIS — Z79899 Other long term (current) drug therapy: Secondary | ICD-10-CM

## 2021-09-10 DIAGNOSIS — E559 Vitamin D deficiency, unspecified: Secondary | ICD-10-CM | POA: Diagnosis not present

## 2021-09-10 DIAGNOSIS — K219 Gastro-esophageal reflux disease without esophagitis: Secondary | ICD-10-CM | POA: Diagnosis not present

## 2021-09-10 DIAGNOSIS — F325 Major depressive disorder, single episode, in full remission: Secondary | ICD-10-CM

## 2021-09-10 DIAGNOSIS — Z8639 Personal history of other endocrine, nutritional and metabolic disease: Secondary | ICD-10-CM | POA: Diagnosis not present

## 2021-09-10 DIAGNOSIS — G43009 Migraine without aura, not intractable, without status migrainosus: Secondary | ICD-10-CM | POA: Diagnosis not present

## 2021-09-10 DIAGNOSIS — E785 Hyperlipidemia, unspecified: Secondary | ICD-10-CM

## 2021-09-10 DIAGNOSIS — R7982 Elevated C-reactive protein (CRP): Secondary | ICD-10-CM | POA: Diagnosis not present

## 2021-09-10 MED ORDER — OMEPRAZOLE 20 MG PO CPDR: 20.0000 mg | DELAYED_RELEASE_CAPSULE | Freq: Every day | ORAL | 3 refills | Status: DC

## 2021-09-10 MED ORDER — METHOCARBAMOL 500 MG PO TABS: 500.0000 mg | ORAL_TABLET | Freq: Four times a day (QID) | ORAL | 0 refills | Status: DC | PRN

## 2021-09-10 NOTE — Chronic Care Management (AMB) (Signed)
   09/10/2021  Sonya Lopez November 11, 1955 [id number]  Documentation encounter created to complete case transition. The care management team will continue to follow for care coordination.   [place] [facility] [phone]

## 2021-09-11 ENCOUNTER — Encounter: Payer: Self-pay | Admitting: Family Medicine

## 2021-09-11 LAB — COMPLETE METABOLIC PANEL WITH GFR
AG Ratio: 1.6 (calc) (ref 1.0–2.5)
ALT: 11 U/L (ref 6–29)
AST: 15 U/L (ref 10–35)
Albumin: 4 g/dL (ref 3.6–5.1)
Alkaline phosphatase (APISO): 156 U/L — ABNORMAL HIGH (ref 37–153)
BUN: 22 mg/dL (ref 7–25)
CO2: 30 mmol/L (ref 20–32)
Calcium: 9.4 mg/dL (ref 8.6–10.4)
Chloride: 103 mmol/L (ref 98–110)
Creat: 0.97 mg/dL (ref 0.50–1.05)
Globulin: 2.5 g/dL (calc) (ref 1.9–3.7)
Glucose, Bld: 77 mg/dL (ref 65–99)
Potassium: 4.9 mmol/L (ref 3.5–5.3)
Sodium: 140 mmol/L (ref 135–146)
Total Bilirubin: 0.3 mg/dL (ref 0.2–1.2)
Total Protein: 6.5 g/dL (ref 6.1–8.1)
eGFR: 64 mL/min/{1.73_m2} (ref >=60)

## 2021-09-11 LAB — LIPID PANEL
Cholesterol: 187 mg/dL (ref <200)
HDL: 53 mg/dL (ref >=50)
LDL Cholesterol (Calc): 112 mg/dL (calc) — ABNORMAL HIGH
Non-HDL Cholesterol (Calc): 134 mg/dL (calc) — ABNORMAL HIGH (ref <130)
Total CHOL/HDL Ratio: 3.5 (calc) (ref <5.0)
Triglycerides: 111 mg/dL (ref <150)

## 2021-09-11 LAB — C-REACTIVE PROTEIN: CRP: 16.3 mg/L — ABNORMAL HIGH (ref <8.0)

## 2021-09-11 LAB — VITAMIN B12: Vitamin B-12: 273 pg/mL (ref 200–1100)

## 2021-09-11 LAB — HEMOGLOBIN A1C
Hgb A1c MFr Bld: 5.6 % of total Hgb (ref <5.7)
Mean Plasma Glucose: 114 mg/dL
eAG (mmol/L): 6.3 mmol/L

## 2021-09-30 ENCOUNTER — Ambulatory Visit: Payer: Self-pay

## 2021-09-30 NOTE — Patient Instructions (Signed)
Visit Information  Thank you for taking time to visit with me today. Please don't hesitate to contact me if I can be of assistance to you.   Following are the goals we discussed today:   Goals Addressed             This Visit's Progress    COMPLETED: RNCM: Conditions stable       Care Coordination Interventions: Advised patient to call the [facility] for new questions, concerns, or needs related to chronic conditions. The patient states she feels she is doing well and thanked the [facility] for calling and checking in. Provided education to patient re: Review of how to get in touch with RNCM for education needs, questions, concerns, or assistance with effective management of chronic conditions Reviewed scheduled/upcoming provider appointments including next month with pcp. Sees heart specialist on a regular basis Advised patient to discuss changes, questions, or concerns about chronic conditions with provider Screening for signs and symptoms of depression related to chronic disease state  Assessed social determinant of health barriers             Please call the care guide team at [phone] if you need to schedule an appointment.   If you are experiencing a Mental Health or [place] or need someone to talk to, please call the Suicide and Crisis Lifeline: 988 call the [place] National Suicide Prevention Lifeline: (302) 830-6766 or TTY: 727-184-9018 TTY 701-774-7815) to talk to a trained counselor call 1-800-273-TALK (toll free, 24 hour hotline)  Patient verbalizes understanding of instructions and care plan provided today and agrees to view in [place]. Active MyChart status and patient understanding of how to access instructions and care plan via MyChart confirmed with patient.     No further follow up required: the patient is stable and no new needs at this time. Instructions given on how to reach the [facility] for changes or needs Noreene Larsson RN, MSN, [place] [facility] Mobile: [phone]

## 2021-09-30 NOTE — Patient Outreach (Signed)
  Care Coordination   Initial Visit Note   09/30/2021 Name: Sonya Lopez MRN: [id number] DOB: 1955-07-24  Sonya Lopez is a 66 y.o. year old female who sees Steele Sizer, MD for primary care. I spoke with  [name] by phone today.  What matters to the patients health and wellness today?  The patient states she is doing well and has no concerns at this time. Information given on how to reach the [facility] for changes    Goals Addressed             This Visit's Progress    COMPLETED: RNCM: Conditions stable       Care Coordination Interventions: Advised patient to call the [facility] for new questions, concerns, or needs related to chronic conditions. The patient states she feels she is doing well and thanked the [facility] for calling and checking in. Provided education to patient re: Review of how to get in touch with RNCM for education needs, questions, concerns, or assistance with effective management of chronic conditions Reviewed scheduled/upcoming provider appointments including next month with pcp. Sees heart specialist on a regular basis Advised patient to discuss changes, questions, or concerns about chronic conditions with provider Screening for signs and symptoms of depression related to chronic disease state  Assessed social determinant of health barriers           SDOH assessments and interventions completed:  No     Care Coordination Interventions Activated:  Yes  Care Coordination Interventions:  Yes, provided   Follow up plan: No further intervention required.   Encounter Outcome:  Pt. Visit Completed   Noreene Larsson RN, MSN, [facility] Mobile: [phone]

## 2021-10-14 ENCOUNTER — Other Ambulatory Visit: Payer: Self-pay | Admitting: Physician Assistant

## 2021-10-14 DIAGNOSIS — I4892 Unspecified atrial flutter: Secondary | ICD-10-CM

## 2021-11-04 ENCOUNTER — Other Ambulatory Visit: Payer: Self-pay | Admitting: General Surgery

## 2021-11-04 DIAGNOSIS — Z1231 Encounter for screening mammogram for malignant neoplasm of breast: Secondary | ICD-10-CM

## 2021-11-18 DIAGNOSIS — Z23 Encounter for immunization: Secondary | ICD-10-CM | POA: Diagnosis not present

## 2021-12-15 ENCOUNTER — Other Ambulatory Visit: Payer: Self-pay | Admitting: Family Medicine

## 2021-12-22 ENCOUNTER — Ambulatory Visit
Admission: RE | Admit: 2021-12-22 | Discharge: 2021-12-22 | Disposition: A | Discharge status: Home / Self Care | Payer: Medicare Other | Source: Ambulatory Visit | Attending: General Surgery | Admitting: General Surgery

## 2021-12-22 DIAGNOSIS — Z1231 Encounter for screening mammogram for malignant neoplasm of breast: Secondary | ICD-10-CM | POA: Insufficient documentation

## 2021-12-31 ENCOUNTER — Other Ambulatory Visit: Payer: Self-pay | Admitting: General Surgery

## 2021-12-31 DIAGNOSIS — N6001 Solitary cyst of right breast: Secondary | ICD-10-CM

## 2022-01-12 DIAGNOSIS — D3131 Benign neoplasm of right choroid: Secondary | ICD-10-CM | POA: Diagnosis not present

## 2022-01-13 ENCOUNTER — Ambulatory Visit
Admission: RE | Admit: 2022-01-13 | Discharge: 2022-01-13 | Disposition: A | Discharge status: Home / Self Care | Payer: Medicare Other | Source: Ambulatory Visit | Attending: General Surgery | Admitting: General Surgery

## 2022-01-13 DIAGNOSIS — N6001 Solitary cyst of right breast: Secondary | ICD-10-CM | POA: Insufficient documentation

## 2022-01-13 DIAGNOSIS — R922 Inconclusive mammogram: Secondary | ICD-10-CM | POA: Diagnosis not present

## 2022-01-16 ENCOUNTER — Other Ambulatory Visit: Payer: Self-pay | Admitting: Physician Assistant

## 2022-01-18 NOTE — Telephone Encounter (Signed)
Prescription refill request for Eliquis received. Indication:aflutter Last office visit:5/23 Scr:0.9 Age: 66 Weight:99.3  kg  Prescription refilled

## 2022-01-20 DIAGNOSIS — H524 Presbyopia: Secondary | ICD-10-CM | POA: Diagnosis not present

## 2022-03-04 ENCOUNTER — Ambulatory Visit: Payer: Medicare Other | Attending: Cardiology | Admitting: Cardiology

## 2022-03-04 ENCOUNTER — Encounter: Payer: Self-pay | Admitting: Cardiology

## 2022-03-04 VITALS — BP 124/68 | HR 61 | Ht 66.0 in | Wt 220.0 lb

## 2022-03-04 DIAGNOSIS — I480 Paroxysmal atrial fibrillation: Secondary | ICD-10-CM | POA: Diagnosis not present

## 2022-03-04 DIAGNOSIS — G4733 Obstructive sleep apnea (adult) (pediatric): Secondary | ICD-10-CM

## 2022-03-04 DIAGNOSIS — D6869 Other thrombophilia: Secondary | ICD-10-CM

## 2022-03-04 NOTE — Patient Instructions (Signed)
Medication Instructions:  Your physician recommends that you continue on your current medications as directed. Please refer to the Current Medication list given to you today.  *If you need a refill on your cardiac medications before your next appointment, please call your pharmacy*   Lab Work: None ordered   Testing/Procedures: None ordered   Follow-Up: At [facility], you and your health needs are our priority.  As part of our continuing mission to provide you with exceptional heart care, we have created designated Provider Care Teams.  These Care Teams include your primary Cardiologist (physician) and Advanced Practice Providers (APPs -  Physician Assistants and Nurse Practitioners) who all work together to provide you with the care you need, when you need it.    Your next appointment:   1 year(s)  The format for your next appointment:   In Person  Provider:   You will follow up in the [facility] located at [facility]. Your provider will be: Clint R. Fenton, PA-C    Thank you for choosing [facility]!!   Trinidad Curet, RN [phone]

## 2022-03-04 NOTE — Progress Notes (Signed)
Electrophysiology Office Note   Date:  03/04/2022   ID:  Sonya Lopez, Sonya Lopez [birth year], MRN [id number]  PCP:  Steele Sizer, MD  Cardiologist:  Rockey Situ Primary Electrophysiologist:  Mel Tadros Meredith Leeds, MD    Chief Complaint: AF   History of Present Illness: Sonya Lopez is a 67 y.o. female who is being seen today for the evaluation of AF at the request of Steele Sizer, MD. Presenting today for electrophysiology evaluation.  She has a history of diabetes, GERD, atrial fibrillation/flutter.  She has sleep apnea and wears a CPAP.  She is status post atrial fibrillation ablation 03/01/2019.  She was previously on amiodarone.  She did have a few episodes of atrial fibrillation last year that she felt was related to Claritin use.  She has had no further episodes since stopping Claritin.  Today, denies symptoms of palpitations, chest pain, shortness of breath, orthopnea, PND, lower extremity edema, claudication, dizziness, presyncope, syncope, bleeding, or neurologic sequela. The patient is tolerating medications without difficulties.      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  MammoSite   Calculus of kidney    Cancer ([place]) 2017   Left- DCIS   Depression    Diet-controlled type 2 diabetes mellitus ([place])    Difficult intubation 2009   [place] had to use  'boogee" DURING GALLBLADDER SURGERY ONLY-NO PROBLEMS SINCE   Female cystocele 01/05/2011   Fibromyalgia    Gallbladder problem    GERD (gastroesophageal reflux disease)    Headache(784.0)    MIGRAINES   History of kidney stones    IBS (irritable bowel syndrome)    Joint pain    Obesity    OSA (obstructive sleep apnea) 03/27/2019   USE CPAP   OSA (obstructive  sleep apnea)    Personal history of radiation therapy 2017   LEFT lumpectomy   PONV (postoperative nausea and vomiting)    h/o difficult intubation and anes had to use "boogee"   Sleep apnea 2020   Vitamin D deficiency    Vitreous detachment of right eye    sees floaters   Past Surgical History:  Procedure Laterality Date   ABDOMINAL HYSTERECTOMY  2013   ANTERIOR AND POSTERIOR REPAIR  11/30/2011   Procedure: ANTERIOR (CYSTOCELE) AND POSTERIOR REPAIR (RECTOCELE);  Surgeon: Reece Packer, MD;  Location: [facility];  Service: Urology;  Laterality: N/A;  with cysto   ANTERIOR CERVICAL DECOMP/DISCECTOMY FUSION N/A 10/24/2019   Procedure: ANTERIOR CERVICAL DECOMPRESSION/DISCECTOMY FUSION 2 LEVELS C5-7;  Surgeon: Meade Maw, MD;  Location: [facility];  Service: Neurosurgery;  Laterality: N/A;   ATRIAL FIBRILLATION ABLATION N/A 03/01/2019   Procedure: ATRIAL FIBRILLATION ABLATION;  Surgeon: Constance Haw, MD;  Location: [facility];  Service: Cardiovascular;  Laterality: N/A;   BLADDER SUSPENSION  11/30/2011   Procedure: [place] PROCEDURE;  Surgeon: Reece Packer, MD;  Location: [facility];  Service: Urology;  Laterality: N/A;  graft 10x6   BREAST BIOPSY Right 2007   negative core   BREAST BIOPSY Left 09/16/2015   DCIS   BREAST LUMPECTOMY Left 09/30/2015   DCIS clear margins and rad tx/HIGH GRADE DUCTAL CARCINOMA IN SITU, COMEDO TYPE   BREAST LUMPECTOMY WITH  NEEDLE LOCALIZATION Left 09/30/2015   DCIS excision, MammoSite radiation;  Surgeon: Robert Bellow, MD;  Location: [facility];  Service: General;  Laterality: Left;   CATARACT EXTRACTION Left 01/2019   CESAREAN SECTION     x 1   CHOLECYSTECTOMY  2005   COLONOSCOPY  2009   CYSTOSCOPY  11/30/2011   Procedure: CYSTOSCOPY;  Surgeon: Reece Packer, MD;  Location: [facility];  Service: Urology;;   [place] OF UTERUS  05/05/2007   GALLBLADDER SURGERY  2004   SPINE SURGERY  2022   TONSILLECTOMY  1961    VAGINAL HYSTERECTOMY  11/30/2011   Procedure: HYSTERECTOMY VAGINAL;  Surgeon: Emily Filbert, MD;  Location: [facility];  Service: Gynecology;  Laterality: N/A;     Current Outpatient Medications  Medication Sig Dispense Refill   acetaminophen (TYLENOL) 500 MG tablet Take 2 tablets (1,000 mg total) by mouth every 8 (eight) hours. OTC  0   Cholecalciferol (PA VITAMIN D-3 GUMMY PO) Takes 2 gummies daily     diltiazem (CARDIZEM) 30 MG tablet Take 1 tablet (30 mg total) by mouth 4 (four) times daily as needed (atrial fibrillation or elevated heart rates). 30 tablet 2   DULoxetine (CYMBALTA) 20 MG capsule TAKE 2 CAPSULES BY MOUTH EVERY DAY 180 capsule 3   ELDERBERRY PO Take by mouth.     ELIQUIS 5 MG TABS tablet TAKE ONE (1) TABLET BY MOUTH TWO TIMES PER DAY 180 tablet 3   FINACEA 15 % FOAM Apply 1 application topically daily as needed (rosacea).      Galcanezumab-gnlm (EMGALITY) 120 MG/ML SOAJ Inject 120 mg into the skin every 30 (thirty) days. 1.12 mL 11   magnesium oxide (MAG-OX) 400 MG tablet Take 400 mg by mouth every morning.     methocarbamol (ROBAXIN) 500 MG tablet Take 1 tablet (500 mg total) by mouth every 6 (six) hours as needed for muscle spasms. 90 tablet 0   metoprolol succinate (TOPROL-XL) 25 MG 24 hr tablet TAKE 1 TABLET BY MOUTH DAILY 90 tablet 2   omeprazole (PRILOSEC) 20 MG capsule Take 1 capsule (20 mg total) by mouth daily. 90 capsule 3   Probiotic Product (PROBIOTIC PO) Take 1 capsule by mouth daily.      rizatriptan (MAXALT-MLT) 10 MG disintegrating tablet Take 1 tablet (10 mg total) by mouth as needed for migraine. May repeat in 2 hours if needed 27 tablet 4   trolamine salicylate (ASPERCREME) 10 % cream Apply 1 application topically as needed.     No current facility-administered medications for this visit.    Allergies:   Adhesive [tape], Claritin [loratadine], Macrodantin [nitrofurantoin], Metronidazole, Nitrofurantoin macrocrystal, Sulfamethoxazole-trimethoprim, Cephalexin,  Clindamycin hcl, Doxycycline, and Erythromycin   Social History:  The patient  reports that she has never smoked. She has never used smokeless tobacco. She reports that she does not currently use alcohol. She reports that she does not use drugs.   Family History:  [name] family history includes ADD / ADHD in her daughter and father; Arthritis in her father; Asthma in her daughter, daughter, and daughter; Colitis in her daughter; Depression in her father; Diabetes in her brother and mother; Fibromyalgia in her daughter; Hearing loss in her mother; Heart disease in her father and paternal grandfather; Heart failure in her father; Hypertension in her brother and mother; Interstitial cystitis in her daughter; Kidney disease in her mother; Migraines in her daughter; Obesity in her mother; Sleep apnea in her mother; Stroke in her father and  mother; Thyroid cancer (age of onset: 4) in her daughter.   ROS:  Please see the history of present illness.   Otherwise, review of systems is positive for none.   All other systems are reviewed and negative.   PHYSICAL EXAM: VS:  BP 124/68   Pulse 61   Ht '5\' 6"'$  (1.676 m)   Wt 220 lb (99.8 kg)   SpO2 95%   BMI 35.51 kg/m  , BMI Body mass index is 35.51 kg/m. GEN: Well nourished, well developed, in no acute distress  HEENT: normal  Neck: no JVD, carotid bruits, or masses Cardiac: RRR; no murmurs, rubs, or gallops,no edema  Respiratory:  clear to auscultation bilaterally, normal work of breathing GI: soft, nontender, nondistended, + BS MS: no deformity or atrophy  Skin: warm and dry Neuro:  Strength and sensation are intact Psych: euthymic mood, full affect  EKG:  EKG is ordered today. Personal review of the ekg ordered shows sinus rhythm   Recent Labs: 03/13/2021: Hemoglobin 12.8; Platelets 316 09/10/2021: ALT 11; BUN 22; Creat 0.97; Potassium 4.9; Sodium 140    Lipid Panel     Component Value Date/Time   CHOL 187 09/10/2021 1020   CHOL 180  07/01/2020 1015   CHOL 164 04/17/2011 0507   TRIG 111 09/10/2021 1020   TRIG 83 04/17/2011 0507   HDL 53 09/10/2021 1020   HDL 49 07/01/2020 1015   HDL 43 04/17/2011 0507   CHOLHDL 3.5 09/10/2021 1020   VLDL 15 01/05/2016 0828   VLDL 17 04/17/2011 0507   LDLCALC 112 (H) 09/10/2021 1020   LDLCALC 104 (H) 04/17/2011 0507     Wt Readings from Last 3 Encounters:  03/04/22 220 lb (99.8 kg)  09/10/21 219 lb (99.3 kg)  06/17/21 221 lb 8 oz (100.5 kg)      Other studies Reviewed: Additional studies/ records that were reviewed today include: ETT 09/29/17 personally reviewed  Review of the above records today demonstrates:  Normal exercise treadmill study Good exercise tolerance Target heart rate achieved Appropriate recovery after 3 minutes   ASSESSMENT AND PLAN:  1.  Paroxysmal atrial fibrillation/flutter: Status post ablation 03/01/2019.  Currently on Eliquis.  CHA2DS2-VASc of at least 2.  She did have a few episodes of atrial fibrillation that she feels was related to Claritin use.  Has had no further episodes.  Erskine Steinfeldt continue with current management.  2.  Hyperlipidemia: Weight loss encouraged.  Plan per primary cardiology  3.  Obstructive sleep apnea: CPAP compliance encouraged  4.  Secondary hypercoagulable state: Currently on Eliquis for atrial fibrillation as above   Current medicines are reviewed at length with the patient today.   The patient does not have concerns regarding her medicines.  The following changes were made today: None  Labs/ tests ordered today include:  Orders Placed This Encounter  Procedures   EKG 12-Lead     Disposition:   FU 12 months  Signed, Albertha Beattie Meredith Leeds, MD  03/04/2022 10:57 AM     [facility] [address] [place] [address] [phone] (office) [phone] (fax)

## 2022-03-11 ENCOUNTER — Encounter (HOSPITAL_COMMUNITY): Payer: Self-pay | Admitting: *Deleted

## 2022-04-01 ENCOUNTER — Other Ambulatory Visit: Payer: Self-pay | Admitting: Family Medicine

## 2022-04-01 DIAGNOSIS — R7982 Elevated C-reactive protein (CRP): Secondary | ICD-10-CM

## 2022-04-08 ENCOUNTER — Ambulatory Visit
Admission: RE | Admit: 2022-04-08 | Discharge: 2022-04-08 | Disposition: A | Discharge status: Home / Self Care | Payer: Medicare Other | Source: Ambulatory Visit | Attending: Family Medicine | Admitting: Family Medicine

## 2022-04-08 DIAGNOSIS — I700 Atherosclerosis of aorta: Secondary | ICD-10-CM | POA: Diagnosis not present

## 2022-04-08 DIAGNOSIS — R7982 Elevated C-reactive protein (CRP): Secondary | ICD-10-CM

## 2022-04-08 DIAGNOSIS — N200 Calculus of kidney: Secondary | ICD-10-CM | POA: Diagnosis not present

## 2022-04-08 DIAGNOSIS — K5730 Diverticulosis of large intestine without perforation or abscess without bleeding: Secondary | ICD-10-CM | POA: Diagnosis not present

## 2022-04-08 DIAGNOSIS — N281 Cyst of kidney, acquired: Secondary | ICD-10-CM | POA: Diagnosis not present

## 2022-04-13 ENCOUNTER — Other Ambulatory Visit: Payer: Self-pay | Admitting: Family Medicine

## 2022-04-13 DIAGNOSIS — E785 Hyperlipidemia, unspecified: Secondary | ICD-10-CM

## 2022-04-16 ENCOUNTER — Ambulatory Visit
Admission: RE | Admit: 2022-04-16 | Discharge: 2022-04-16 | Disposition: A | Discharge status: Home / Self Care | Payer: No Typology Code available for payment source | Source: Ambulatory Visit | Attending: Family Medicine | Admitting: Family Medicine

## 2022-04-16 DIAGNOSIS — E785 Hyperlipidemia, unspecified: Secondary | ICD-10-CM

## 2022-04-16 DIAGNOSIS — E7800 Pure hypercholesterolemia, unspecified: Secondary | ICD-10-CM | POA: Diagnosis not present

## 2022-05-03 DIAGNOSIS — R7982 Elevated C-reactive protein (CRP): Secondary | ICD-10-CM | POA: Diagnosis not present

## 2022-05-03 DIAGNOSIS — K219 Gastro-esophageal reflux disease without esophagitis: Secondary | ICD-10-CM | POA: Diagnosis not present

## 2022-05-04 ENCOUNTER — Other Ambulatory Visit: Payer: Medicare Other

## 2022-05-05 ENCOUNTER — Encounter: Payer: Self-pay | Admitting: Neurology

## 2022-05-05 ENCOUNTER — Ambulatory Visit (INDEPENDENT_AMBULATORY_CARE_PROVIDER_SITE_OTHER): Payer: Medicare Other | Admitting: Neurology

## 2022-05-05 VITALS — BP 130/66 | HR 61 | Ht 66.0 in | Wt 229.0 lb

## 2022-05-05 DIAGNOSIS — G43009 Migraine without aura, not intractable, without status migrainosus: Secondary | ICD-10-CM | POA: Diagnosis not present

## 2022-05-05 DIAGNOSIS — M4722 Other spondylosis with radiculopathy, cervical region: Secondary | ICD-10-CM

## 2022-05-05 DIAGNOSIS — G4730 Sleep apnea, unspecified: Secondary | ICD-10-CM | POA: Diagnosis not present

## 2022-05-05 DIAGNOSIS — G4733 Obstructive sleep apnea (adult) (pediatric): Secondary | ICD-10-CM

## 2022-05-05 DIAGNOSIS — M5417 Radiculopathy, lumbosacral region: Secondary | ICD-10-CM | POA: Diagnosis not present

## 2022-05-05 DIAGNOSIS — I484 Atypical atrial flutter: Secondary | ICD-10-CM

## 2022-05-05 DIAGNOSIS — G4734 Idiopathic sleep related nonobstructive alveolar hypoventilation: Secondary | ICD-10-CM

## 2022-05-05 DIAGNOSIS — G43711 Chronic migraine without aura, intractable, with status migrainosus: Secondary | ICD-10-CM

## 2022-05-05 MED ORDER — UBRELVY 100 MG PO TABS: 100.0000 mg | ORAL_TABLET | ORAL | 11 refills | Status: DC | PRN

## 2022-05-05 NOTE — Patient Instructions (Signed)
Try Roselyn Meier instead of maxalt or with maxalt  [facility] [place], [address], [id number]   Ubrelvy: Please take one tablet at the onset of your headache. If it does not improve the symptoms please take one additional tablet. Do not take more then 2 tablets in 24hrs.   Ubrogepant Tablets What is this medication? UBROGEPANT (ue BROE je pant) treats migraines. It works by blocking a substance in the body that causes migraines. It is not used to prevent migraines. This medicine may be used for other purposes; ask your health care provider or pharmacist if you have questions. COMMON BRAND NAME(S): Roselyn Meier What should I tell my care team before I take this medication? They need to know if you have any of these conditions: Kidney disease Liver disease An unusual or allergic reaction to ubrogepant, other medications, foods, dyes, or preservatives Pregnant or trying to get pregnant Breast-feeding How should I use this medication? Take this medication by mouth with a glass of water. Take it as directed on the prescription label. You can take it with or without food. If it upsets your stomach, take it with food. Keep taking it unless your care team tells you to stop. Talk to your care team about the use of this medication in children. Special care may be needed. Overdosage: If you think you have taken too much of this medicine contact a poison control center or emergency room at once. NOTE: This medicine is only for you. Do not share this medicine with others. What if I miss a dose? This does not apply. This medication is not for regular use. What may interact with this medication? Do not take this medication with any of the following: Adagrasib Ceritinib Certain antibiotics, such as chloramphenicol, clarithromycin, telithromycin Certain antivirals for HIV, such as atazanavir, cobicistat, darunavir, delavirdine, fosamprenavir, indinavir, ritonavir Certain medications for  fungal infections, such as itraconazole, ketoconazole, posaconazole, voriconazole Conivaptan Grapefruit Idelalisib Mifepristone Nefazodone Ribociclib This medication may also interact with the following: Carvedilol Certain medications for seizures, such as phenobarbital, phenytoin Ciprofloxacin Cyclosporine Eltrombopag Fluconazole Fluvoxamine Quinidine Rifampin St. John's wort Verapamil This list may not describe all possible interactions. Give your health care provider a list of all the medicines, herbs, non-prescription drugs, or dietary supplements you use. Also tell them if you smoke, drink alcohol, or use illegal drugs. Some items may interact with your medicine. What should I watch for while using this medication? Visit your care team for regular checks on your progress. Tell your care team if your symptoms do not start to get better or if they get worse. Your mouth may get dry. Chewing sugarless gum or sucking hard candy and drinking plenty of water may help. Contact your care team if the problem does not go away or is severe. What side effects may I notice from receiving this medication? Side effects that you should report to your care team as soon as possible: Allergic reactions--skin rash, itching, hives, swelling of the face, lips, tongue, or throat Side effects that usually do not require medical attention (report to your care team if they continue or are bothersome): Drowsiness Dry mouth Fatigue Nausea This list may not describe all possible side effects. Call your doctor for medical advice about side effects. You may report side effects to FDA at 1-800-FDA-1088. Where should I keep my medication? Keep out of the reach of children and pets. Store between 15 and 30 degrees C (59 and 86 degrees F). Get rid  of any unused medication after the expiration date. To get rid of medications that are no longer needed or have expired: Take the medication to a medication take-back  program. Check with your pharmacy or law enforcement to find a location. If you cannot return the medication, check the label or package insert to see if the medication should be thrown out in the garbage or flushed down the toilet. If you are not sure, ask your care team. If it is safe to put it in the trash, pour the medication out of the container. Mix the medication with cat litter, dirt, coffee grounds, or other unwanted substance. Seal the mixture in a bag or container. Put it in the trash. NOTE: This sheet is a summary. It may not cover all possible information. If you have questions about this medicine, talk to your doctor, pharmacist, or health care provider.  2023 Elsevier/Gold Standard (2021-02-26 00:00:00)

## 2022-05-05 NOTE — Progress Notes (Addendum)
[facility]    Provider:  Dr Sonya Lopez Requesting Provider: [name], MD Primary Care Provider:  [name], MD  CC:  Back pain, migraines, neck pain  Thank you to my very gracious colleage Dr. Vickey Lopez:  Sonya Lopez, Sonya Mylar, MD  Sonya Fret, MD; Sonya Lopez I can follow up, yes. Sonya Lopez and I looked into her history and she has had a repeat sleep study with pulmonology, scored at that test AHI of 11/h , therefore she qualified for CPAP. If she is still seen at [facility] for other reasons, I don't mind to se her for sleep in follow up.  Follow up 05/05/2022:  Prior to emgality had 10 severe migraines a month, 5 moderate to severe and at least daily headaches now doing great continue. Here alone for migraine follow-up. Patient states they are much better with the Emgality. She states her insurance approval is up for renewal for [employer]. She has only 4 migraines per month and < 10 total headache days a month She is very pleased with Emgality. Maxalt isn't working however. She goes to chiropractor and getting softwave and helping tremendously for back pain [facility] Dr. Festus Lopez for back pain [place]. Migraines are doing great. She is on a cpap bu dr Sonya Lopez, DDS thinks she can use also the cpap and a mouth guard not seeing sleep doctor will ask if we can take over cpap management. Maxalt doesn't work acutely. naratriptan and maxalt and sumatriptan, nurtec did not work. Neck helped with chiropractor s/p ACDF for neck too. Afib under contol goes to the afib clinic. She is doing the best she has done in 15 years.  Patient complains of symptoms per HPI as well as the following symptoms: none . Pertinent negatives and positives per HPI. All others negative   Interval history 05/04/2020:   Migraines; doing "great, doing wonderful" she took maxalt, worked, had to take 2 once day. Takes as wonderful. Cpap: morning headaches reduced, doing much better S/p  ACDF: She goes to chiropractor once a week and helps a lot. Numbness at scar is stable, not bothering her Afib: under control, stay on eliquis on metoprolol Low back: Is fine. She still has sciatica, chiropractor helps, she is walking a lot more 8k-10k steps. She doesn't need injections in her back.  Patient complains of symptoms per HPI as well as the following symptoms: migraines much improved . Pertinent negatives and positives per HPI. All others negative  Interval history: 10/22/2020  1.  Migraines, she is doing extremely well, only about 4 mild migraines a month, Maxalt is useful, Tylenol with codeine only when needed her migraines severe or refractory.  We will have to change her from Ajovy to [employer] due to insurance but she feels these injections have helped her tremendously. 2.  She is still on her CPAP machine, her morning headaches have significantly reduced 3.  She had ACDF, she does feel much better, she is going to a chiropractor who is also helping her with her hip pain and overall she feels less pain in general which is excellent.  She still has numbness in the area of the scar but that likely will go away. 4.  Her A. fib is under control, she states she has only had 2 episodes recently. 5. Changed to emgality from [place] and feels it is better   Sonya Lopez is getting married July 21st.   Update April 17, 2020: Patient was diagnosed with mild obstructive sleep apnea but hypoxia  of 82% and is now under the care of the pulmonology groups, she feels improved is using CPAP and is compliant.  Cymbalta and gabapentin seem to be helping with pain symptoms.  She is seeing orthopedics for bilateral hip pain.  She has a history of atrial ablation, she had A. fib and had ablation done on February 20, 2018 she still on Eliquis and metoprolol but has been doing better.  For her migraines controlled since being on Ajovy, but increasing episodes of the past few months.  She is status post cervical  fusion and decompression, with improved radiculopathy of the right arm.  She is being seen by Dr. Carlynn Lopez for depression, history of diabetes, obesity.  She has history of B12 deficiency, last B12 in August 2021 was 269 we may consider repeating.  She has been having migraines 2-3 days a month. She tries naratriptan and maxalt, sometimes that helps but sometimes not, she had dry needling.she had it done in PT and her bursitis and sciatic and hip, she loves her therapist Sonya Lopez) with [facility] in [place]. Tried rizatriptan, naratriptan, sumatriptan and not working will try [place]. And also will try Ajovy every 3 weeks. We discussed fatigue, will order labs. Will do ajovy every 3 weeks.   Update 10/16/2019: She has been having severe neck pain and radiation. MRi showed broad based disc protrusion c5-c6 causing severe right and moderate left foraminal stenosis, c6-c7 central right paracentral disc protrusion causing right ventral spinal cord abutment (reviewed CD images she brought today). I discussed surgery and agree she needs ACDF. She also has degenrative disease in the lumbar spine but not severe and feeling better in the low back, she is having left-sided sciatica, it comes and goes but doesn't stay. We reviewed her MRI lumbar spine images with arthtritic changes and mild forminal stenosis but no nerve pinching.   No afib since the ablation. She is going to come off the Eliquis 3 days prior to surgery.  As far as migraines she is doing better, Ajovy alone is helping and the ones she does have a few and they are very responsive to acute medications. Will hold off on botox. Will give her 6 months samples of Ajovy and see if we can get insurance to approve Ajovy if not we will try Emgality.   HPI 06/2018:  Sonya Lopez is a lovely 67 y.o. female here as requested by Sonya Cory, MD for migraines. PMHx migraines, lumbar radiculopathy, obesity, kidney stones, fibromyalgia, atrial  fibrillation on long-term anticoagulation, diet-controlled diabetes 2, depression, breast neoplasm, anxiety.  Symptoms started in 1993. Bending over in the garden and felt a sharp pain in the left buttocks and hips. Worsening since then. Diagnosed with bursitis and lumbar radic. She always has pain down the left leg. Worse bending over to clean bathtub. Starts in the buttocks and has pain in the buttocks (points to the bursa) but radiates down the back of the leg and tender in the lateral thigh. She has been to orthopaedics. She had physical therapy which helped the right side which started hurting but the left side never improved. She went back to orthopaedics. It aches all the time. Shooting pain bending over, when she walks a long time, she has sit down after walking a certain distance to rest. Resting and sitting helps, but sitting too long makes it worse. Getting up and walking helps if she doesn't go too far. She has paresthesias in the left lower leg. She also feels  weakness in her left leg, her leg will "give out" slightly not enough to fall but weakness and sititing down helps. Weakness worse going up the steps.   She has left-side pain in the arm as well. She has tightness in the cervical areas. A lot of tightness. She has neck pain but doesn't c/o of radicular pain. And this hurts with the migraines as well. Discussed dry needling.She has tenderness at the base of the ventral palms. No numbness or tingling, no weakness. Worse with typing a lot during the day, no nocturnal awakenings.Consider wrist splints.   Migraines: She has a history of migraines. She has a headache 18 days of the month. 8-10 migraine days a month. She uses a triptan which helps. She takes amerge. She sometimes has to take a second. Some it doesn't. Migraines are on the left side, behind the eye sna temple, radiates to the left side of the head and into the left cervical muscles. Migrianes are oderately severe or severe, hurts so  badly she can;t keep her eye open, nausea, no vomiting, light and sound sensitivity. movement makes it worse, ice helps. Unilateral, usually on the left but can be on the right. Ongoing at this frequency for > 10 years. Even since the 70s. She has just lived with it. In the last 10 years they are better than they were but still affecting life and can be very bad. She can't move, lay in a dark room. No new vision changes. She wakes up with headache in the morning very often. She has afib, she snore sometimes, really fatigued, never wakes feeling rested, she wakes frequently, fatigued all day. She was diagnosed with fibromyalgia in 12/2000. She had pressure points, pain, sore, burning, difficulty concentrating, fatigue, tinging in the left toe and has some itching in the toe. She has had rashes on her feet and upper legs, she has joint pain in different areas, her fingers get sore. She has dry mouth, no color changes I the fingers, she gets blisters on her tongue a lot and inner cheeks.   She feels her word-finding skills are impaired. She has been intelligent, she works hard, she was evaluated in the past for skipping grades growing up. She feels the word finding. It will eventually. She is tired. She has mild depression. She has been taking care of everyone else, her daughter, her mother, hr grandchild and she is tired and worn out. She taking care of herself more, feels it is getting better.   Meds tried: lyrica, Cymbalta, gabapentin, metoprolol, amerge, imipamine, propranolol, venlafaxine  Reviewed notes, labs and imaging from outside physicians, which showed:  Ct showed No acute intracranial abnormalities including mass lesion or mass effect, hydrocephalus, extra-axial fluid collection, midline shift, hemorrhage, or acute infarction, large ischemic events (personally reviewed images) 02/2018 ldl 108 hgba1c 5.5 Creating, BUN normal  02/2017: B12 643 Vit D 35 HgbA1c 6.4 TSH nml  Review of  Systems: Patient complains of symptoms per HPI as well as the following symptoms: none . Pertinent negatives and positives per HPI. All others negative     Social History   Socioeconomic History   Marital status: Married    Spouse name: Theatre manager    Number of children: 3   Years of education: 16   Highest education level: Bachelor's degree (e.g., BA, AB, BS)  Occupational History   Occupation: Diplomatic Services operational officer  Tobacco Use   Smoking status: Never   Smokeless tobacco: Never  Vaping Use   Vaping Use: Never  used  Substance and Sexual Activity   Alcohol use: Not Currently    Comment: Due to AF   Drug use: No   Sexual activity: Yes    Partners: Male    Birth control/protection: Surgical  Other Topics Concern   Not on file  Social History Narrative   Lives at home with husband    Right handed   Caffeine: about 1 cup daily   Social Determinants of Health   Financial Resource Strain: Low Risk  (06/23/2021)   Overall Financial Resource Strain (CARDIA)    Difficulty of Paying Living Expenses: Not hard at all  Food Insecurity: No Food Insecurity (06/23/2021)   Hunger Vital Sign    Worried About Running Out of Food in the Last Year: Never true    Ran Out of Food in the Last Year: Never true  Transportation Needs: No Transportation Needs (06/23/2021)   PRAPARE - Administrator, Civil Service (Medical): No    Lack of Transportation (Non-Medical): No  Physical Activity: Sufficiently Active (06/23/2021)   Exercise Vital Sign    Days of Exercise per Week: 5 days    Minutes of Exercise per Session: 30 min  Stress: No Stress Concern Present (06/23/2021)   [employer] of Occupational Health - Occupational Stress Questionnaire    Feeling of Stress : Not at all  Social Connections: Socially Integrated (06/23/2021)   Social Connection and Isolation Panel [NHANES]    Frequency of Communication with Friends and Family: More than three times a week    Frequency of Social Gatherings  with Friends and Family: More than three times a week    Attends Religious Services: More than 4 times per year    Active Member of [employer] or Organizations: Yes    Attends Engineer, structural: More than 4 times per year    Marital Status: Married  Catering manager Violence: Not At Risk (06/23/2021)   Humiliation, Afraid, Rape, and Kick questionnaire    Fear of Current or Ex-Partner: No    Emotionally Abused: No    Physically Abused: No    Sexually Abused: No    Family History  Problem Relation Age of Onset   Diabetes Mother    Hearing loss Mother    Hypertension Mother    Stroke Mother    Kidney disease Mother    Sleep apnea Mother    Obesity Mother    Heart disease Father    Heart failure Father    Stroke Father    Depression Father    ADD / ADHD Father    Arthritis Father    Heart disease Paternal Grandfather    Asthma Daughter    ADD / ADHD Daughter    Asthma Daughter    Migraines Daughter    Fibromyalgia Daughter    Interstitial cystitis Daughter    Asthma Daughter    Colitis Daughter    Thyroid cancer Daughter 7   Diabetes Brother    Hypertension Brother    Breast cancer Neg Hx     Past Medical History:  Diagnosis Date   Actinic keratosis    Allergy    Anxiety    situational anxiety   Arrhythmia    A-fib/A-flutter   Atrial flutter    Breast neoplasm, Tis (DCIS), 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  MammoSite   Calculus of kidney    Cancer 2017   Left- DCIS   Depression  Diet-controlled type 2 diabetes mellitus    Difficult intubation 2009   [facility]- anes had to use  'boogee" DURING GALLBLADDER SURGERY ONLY-NO PROBLEMS SINCE   Female cystocele 01/05/2011   Fibromyalgia    Gallbladder problem    GERD (gastroesophageal reflux disease)    Headache(784.0)    MIGRAINES   History of kidney stones    IBS (irritable bowel syndrome)    Joint pain    Nocturnal hypoxemia 05/09/2022    Obesity    OSA (obstructive sleep apnea) 03/27/2019   USE CPAP   OSA (obstructive sleep apnea)    Personal history of radiation therapy 2017   LEFT lumpectomy   PONV (postoperative nausea and vomiting)    h/o difficult intubation and anes had to use "boogee"   Sleep apnea 2020   Vitamin D deficiency    Vitreous detachment of right eye    sees floaters    Patient Active Problem List   Diagnosis Date Noted   Nocturnal hypoxemia 05/09/2022   Chronic migraine without aura, with intractable migraine, so stated, with status migrainosus 05/09/2022   Cervical spondylosis with radiculopathy 05/09/2022   Sleep apnea 05/09/2022   Hypotension 01/31/2021   History of diabetes mellitus, type II 09/11/2019   OSA (obstructive sleep apnea) 03/27/2019   Syncope 05/10/2018   Pain, elbow joint 02/02/2018   PAF (paroxysmal atrial fibrillation) 09/13/2017   Trochanteric bursitis of left hip 06/24/2017   Low back pain 02/22/2017   Benign breast cyst in female, right 05/25/2016   History of left breast cancer 09/16/2015   Long-term use of high-risk medication 02/25/2015   Mild major depression 07/29/2014   Gastric reflux 07/29/2014   Chronic interstitial cystitis 07/29/2014   Calculus of kidney 07/29/2014   Allergic rhinitis 07/29/2014   Acne erythematosa 07/29/2014   Lumbosacral radiculitis 07/29/2014   Tinnitus 07/29/2014   Mixed incontinence, urge and stress (female) (female) 08/23/2013   Hyperlipidemia 04/25/2013   H/O: hysterectomy 11/30/2011   Atrial flutter 05/03/2011   Bladder cystocele 01/05/2011   Migraine without aura and without status migrainosus, not intractable 12/29/2010   Fibromyalgia 12/29/2010   Obesity 12/29/2010    Past Surgical History:  Procedure Laterality Date   ABDOMINAL HYSTERECTOMY  2013   ANTERIOR AND POSTERIOR REPAIR  11/30/2011   Procedure: ANTERIOR (CYSTOCELE) AND POSTERIOR REPAIR (RECTOCELE);  Surgeon: Martina Sinner, MD;  Location: [facility];  Service:  Urology;  Laterality: N/A;  with cysto   ANTERIOR CERVICAL DECOMP/DISCECTOMY FUSION N/A 10/24/2019   Procedure: ANTERIOR CERVICAL DECOMPRESSION/DISCECTOMY FUSION 2 LEVELS C5-7;  Surgeon: Venetia Night, MD;  Location: [facility];  Service: Neurosurgery;  Laterality: N/A;   ATRIAL FIBRILLATION ABLATION N/A 03/01/2019   Procedure: ATRIAL FIBRILLATION ABLATION;  Surgeon: Regan Lemming, MD;  Location: [facility];  Service: Cardiovascular;  Laterality: N/A;   BLADDER SUSPENSION  11/30/2011   Procedure: SPARC PROCEDURE;  Surgeon: Martina Sinner, MD;  Location: [facility];  Service: Urology;  Laterality: N/A;  graft 10x6   BREAST BIOPSY Right 2007   negative core   BREAST BIOPSY Left 09/16/2015   DCIS   BREAST LUMPECTOMY Left 09/30/2015   DCIS clear margins and rad tx/HIGH GRADE DUCTAL CARCINOMA IN SITU, COMEDO TYPE   BREAST LUMPECTOMY WITH NEEDLE LOCALIZATION Left 09/30/2015   DCIS excision, MammoSite radiation;  Surgeon: Earline Mayotte, MD;  Location: [facility];  Service: General;  Laterality: Left;   CATARACT EXTRACTION Left 01/2019   CESAREAN SECTION  x 1   CHOLECYSTECTOMY  2005   COLONOSCOPY  2009   CYSTOSCOPY  11/30/2011   Procedure: CYSTOSCOPY;  Surgeon: Martina Sinner, MD;  Location: [facility];  Service: Urology;;   DILATION AND CURETTAGE OF UTERUS  05/05/2007   GALLBLADDER SURGERY  2004   SPINE SURGERY  2022   TONSILLECTOMY  1961   VAGINAL HYSTERECTOMY  11/30/2011   Procedure: HYSTERECTOMY VAGINAL;  Surgeon: Allie Bossier, MD;  Location: [facility];  Service: Gynecology;  Laterality: N/A;    Current Outpatient Medications  Medication Sig Dispense Refill   acetaminophen (TYLENOL) 500 MG tablet Take 2 tablets (1,000 mg total) by mouth every 8 (eight) hours. OTC  0   Cholecalciferol (PA VITAMIN D-3 GUMMY PO) Takes 2 gummies daily     diltiazem (CARDIZEM) 30 MG tablet Take 1 tablet (30 mg total) by mouth 4 (four) times daily as needed (atrial fibrillation or elevated  heart rates). 30 tablet 2   DULoxetine (CYMBALTA) 20 MG capsule TAKE 2 CAPSULES BY MOUTH EVERY DAY 180 capsule 3   ELDERBERRY PO Take by mouth.     ELIQUIS 5 MG TABS tablet TAKE ONE (1) TABLET BY MOUTH TWO TIMES PER DAY 180 tablet 3   FINACEA 15 % FOAM Apply 1 application topically daily as needed (rosacea).      Galcanezumab-gnlm (EMGALITY) 120 MG/ML SOAJ Inject 120 mg into the skin every 30 (thirty) days. 1.12 mL 11   magnesium oxide (MAG-OX) 400 MG tablet Take 400 mg by mouth every morning.     methocarbamol (ROBAXIN) 500 MG tablet Take 1 tablet (500 mg total) by mouth every 6 (six) hours as needed for muscle spasms. 90 tablet 0   metoprolol succinate (TOPROL-XL) 25 MG 24 hr tablet TAKE 1 TABLET BY MOUTH DAILY 90 tablet 2   omeprazole (PRILOSEC) 20 MG capsule Take 1 capsule (20 mg total) by mouth daily. 90 capsule 3   Probiotic Product (PROBIOTIC PO) Take 1 capsule by mouth daily.      rizatriptan (MAXALT-MLT) 10 MG disintegrating tablet Take 1 tablet (10 mg total) by mouth as needed for migraine. May repeat in 2 hours if needed 27 tablet 4   trolamine salicylate (ASPERCREME) 10 % cream Apply 1 application topically as needed.     Ubrogepant (UBRELVY) 100 MG TABS Take 1 tablet (100 mg total) by mouth every 2 (two) hours as needed. Maximum 200mg  a day. 16 tablet 11   VITAMIN D PO Take by mouth. gummy     No current facility-administered medications for this visit.    Allergies as of 05/05/2022 - Review Complete 05/05/2022  Allergen Reaction Noted   Adhesive [tape] Dermatitis 11/23/2011   Claritin [loratadine] Swelling 07/01/2020   Macrodantin [nitrofurantoin] Nausea And Vomiting 11/15/2011   Metronidazole Nausea Only 07/29/2014   Nitrofurantoin macrocrystal Nausea And Vomiting 01/31/2015   Sulfamethoxazole-trimethoprim Nausea Only 07/29/2014   Cephalexin Rash 12/28/2010   Clindamycin hcl Rash 01/31/2015   Doxycycline Swelling and Rash 12/28/2010   Erythromycin Rash 11/15/2011     Vitals: BP 130/66 (BP Location: Right Arm, Patient Position: Sitting)   Pulse 61   Ht 5\' 6"  (1.676 m)   Wt 229 lb (103.9 kg)   BMI 36.96 kg/m  Last Weight:  Wt Readings from Last 1 Encounters:  05/05/22 229 lb (103.9 kg)   Last Height:   Ht Readings from Last 1 Encounters:  05/05/22 5\' 6"  (1.676 m)   Exam: NAD, pleasant  Speech:    Speech is normal; fluent and spontaneous with normal comprehension.  Cognition:    The patient is oriented to person, place, and time;     recent and remote memory intact;     language fluent;    Cranial Nerves:    The pupils are equal, round, and reactive to light.Trigeminal sensation is intact and the muscles of mastication are normal. The face is symmetric. The palate elevates in the midline. Hearing intact. Voice is normal. Shoulder shrug is normal. The tongue has normal motion without fasciculations.   Coordination:  No dysmetria  Motor Observation:    No asymmetry, no atrophy, and no involuntary movements noted. Tone:    Normal muscle tone.     Strength:    Strength is V/V in the upper and lower limbs.      Sensation: intact to LT       Sensation: intact to LT   Assessment/Plan:  SANAM MARMO is a lovely 67 y.o. female here as requested by Sonya Cory, MD for migraines, neck pain, low back pain. PMHx migraines, lumbar and cervical  radiculopathy, obesity, kidney stones, fibromyalgia, atrial fibrillation on long-term anticoagulation, diet-controlled diabetes 2, depression, breast neoplasm, anxiety.   Thank you to my very gracious colleage Dr. Vickey Lopez:  Sonya Lopez, Sonya Mylar, MD  Sonya Fret, MD; Sonya Lopez I can follow up, yes. Sonya Lopez and I looked into her history and she has had a repeat sleep study with pulmonology, scored at that test AHI of 11/h , therefore she qualified for CPAP. If she is still seen at [facility] for other reasons, I don't mind to se her for sleep in follow up.  Orders Placed  This Encounter  Procedures   Ambulatory referral to Sleep Studies    Try ubrelvy instead of maxalt or with maxalt; Prior to emgality had 10 severe migraines a month, 5 moderate to severe and at least daily headaches now doing great continue. Here alone for migraine follow-up. Patient states they are much better with the Emgality. She states her insurance approval is up for renewal for [employer]. She has only 4 migraines per month and < 10 total headache days a month She is very pleased with Emgality. Maxalt isn't working however.  [facility] - [place], [place] - [address], [address]   Ubrelvy: Please take one tablet at the onset of your headache. If it does not improve the symptoms please take one additional tablet. Do not take more then 2 tablets in 24hrs.   Migraines; Prior to emgality had 10 severe migraines a month, 5 moderate to severe and at least daily headaches now doing great continue. doing "great, doing wonderful" she took maxalt, doesn't help, try ubrelvy, continue emgality Cpap: morning headaches reduced, doing much better, will see if our team can take over follow up S/p ACDF: She goes to chiropractor once a week and helps a lot. Numbness at scar is stable, not bothering her, feels great Afib: under control, stay on eliquis on metoprolol Low back: Is fine. She still has sciatica, chiropractor helps, she is walking a lot more 8k-10k steps. She doesn't need injections in her back. She feels great  Sleep: got a cpap from pulm. Spoke to Dr. Vickey Lopez at length; Dr. Vickey Lopez graciously agreed to take over her care.  Meds ordered this encounter  Medications   Ubrogepant (UBRELVY) 100 MG TABS    Sig: Take 1 tablet (100 mg total) by mouth every 2 (two) hours as  needed. Maximum 200mg  a day.    Dispense:  16 tablet    Refill:  11    She has only 4-5 migraines per month and < 10 total headache days a month  Tried: Maxalt, naratriptan and sumatriptan, nurtec did not work.       F/u one year   PRIOR ASSESSMENT AND PLAN 10/22/2020   - Patient was diagnosed with mild obstructive sleep apnea but hypoxia of 82% and is now under the care of the pulmonology groups, she feels improved is using CPAP and is compliant.  Morning headaches improved.  - Cymbalta and gabapentin seem to be helping with pain symptoms.  However Gabapentin can cause fatigue, recommend she try to decrease or use prn  - She is seeing chiropractor for bilateral hip pain. Dry needling has helped tremendously and she is having it done on her cervical muscles as well.  She feels much better overall as far as pain.  -  She has a history of atrial ablation, she had A. fib and had ablation done on February 20, 2018 she still on Eliquis and metoprolol but has been doing better. No more episodes of heart palpitations.  She is only had 2 in recent months.  - For her migraines controlled since being on Ajovy, It wears out by the end of the month. Try it q3weeks.  At this time we have to change to [facility] unfortunately I will call in a prescription.  For acute management continue Maxalt and Tylenol 3 for severe refractory migraines.  Only having 3-4 a month.  -  She is status post cervical fusion and decompression, with improved radiculopathy of the right arm.  She is seeing a chiropractor and feeling much better.  She still has the numbness in her neck at the scar but that will likely be permanent.  - Obesity and fatigue: Referred in the past [facility]  - She is being seen by Dr. Carlynn Lopez for depression, history of diabetes, obesity.  Continue.  - She has history of B12 deficiency, last B12 in August 2021 was 269 repeat in March 2022 was 845 which is great  - Tried rizatriptan, naratriptan, sumatriptan and not working will tried [place].  Continue Maxalt and Tylenol 3 with severe refractory migraines however much improved only 3-4 mild migraines a month.  -Fatigue  improved.  Meds ordered this encounter  Medications   Ubrogepant (UBRELVY) 100 MG TABS    Sig: Take 1 tablet (100 mg total) by mouth every 2 (two) hours as needed. Maximum 200mg  a day.    Dispense:  16 tablet    Refill:  11    She has only 4-5 migraines per month and < 10 total headache days a month  Tried: Maxalt, naratriptan and sumatriptan, nurtec did not work.    Orders Placed This Encounter  Procedures   Ambulatory referral to Sleep Studies      PRIOR: - She has been having severe neck pain and radiation. MRi showed broad based disc protrusion c5-c6 causing severe right and moderate left foraminal stenosis, c6-c7 central right paracentral disc protrusion causing right ventral spinal cord abutment (reviewed CD images she brought today). I discussed surgery and agree she needs ACDF. She is scheduled next week.  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 We reviewed her MRI lumbar spine  images with arthtritic changes and mild forminal stenosis but no frank nerve pinching. Still if symptoms worsen she may consider ESI and if severe would consider surgical intervention because of some broad disk bulges l4/l5 and l5/s1  - No afib since the ablation. She is going to come off the Eliquis 3 days prior to surgery. There is risk of stroke during those 3 days but much less due to treatment of afib. Discussed.  - As far as migraines she is doing better, Ajovy alone is helping and the ones she does have a few and they are very responsive to acute medications. Will hold off on botox. Will give her 6 months samples of Ajovy and see if we can get insurance to approve Ajovy if not we will try Emgality.   - For her cervical myofascial pain syndrome:  Dry needling of the cervical muscles. [employer]. DRY NEEDLING: Patient has cervical myofascial pain syndrome,  cervicalgia causing disability, pain, decreased range of motion, and contributing to migraines. Referred in the past, hold off for now.  -Hand pain: likely due to cervical disc disease (surgery next week) but if symptoms continue and she has CTS then may need emg/ncs in the future, may be overuse, consider wrist splints when typing a lot or with symptoms and will follow clinically.   -Patient being treated for sleep apnea   Orders Placed This Encounter  Procedures   Ambulatory referral to Sleep Studies    Meds ordered this encounter  Medications   Ubrogepant (UBRELVY) 100 MG TABS    Sig: Take 1 tablet (100 mg total) by mouth every 2 (two) hours as needed. Maximum 200mg  a day.    Dispense:  16 tablet    Refill:  11    She has only 4-5 migraines per month and < 10 total headache days a month  Tried: Maxalt, naratriptan and sumatriptan, nurtec did not work.     Cc: Sonya Cory, MD   Naomie Dean, MD  [facility] [address] [place], [place] [address]  Phone [phone] Fax [phone]  I spent over 40 minutes of face-to-face and non-face-to-face time with patient on the  1. Migraine without aura and without status migrainosus, not intractable   2. OSA (obstructive sleep apnea)   3. Nocturnal hypoxemia   4. Atypical atrial flutter   5. Chronic migraine without aura, with intractable migraine, so stated, with status migrainosus   6. Sleep apnea, unspecified type   7. Cervical spondylosis with radiculopathy   8. Lumbosacral radiculitis    diagnosis.  This included previsit chart review, lab review, study review, order entry, electronic health record documentation, patient education on the different diagnostic and therapeutic options, counseling and coordination of care, risks and benefits of management, compliance, or risk factor reduction

## 2022-05-09 ENCOUNTER — Encounter: Payer: Self-pay | Admitting: Neurology

## 2022-05-09 DIAGNOSIS — G4730 Sleep apnea, unspecified: Secondary | ICD-10-CM | POA: Insufficient documentation

## 2022-05-09 DIAGNOSIS — G43711 Chronic migraine without aura, intractable, with status migrainosus: Secondary | ICD-10-CM | POA: Insufficient documentation

## 2022-05-09 DIAGNOSIS — G4734 Idiopathic sleep related nonobstructive alveolar hypoventilation: Secondary | ICD-10-CM | POA: Insufficient documentation

## 2022-05-09 DIAGNOSIS — M4722 Other spondylosis with radiculopathy, cervical region: Secondary | ICD-10-CM | POA: Insufficient documentation

## 2022-05-09 HISTORY — DX: Idiopathic sleep related nonobstructive alveolar hypoventilation: G47.34

## 2022-05-13 ENCOUNTER — Other Ambulatory Visit: Payer: Self-pay | Admitting: Neurology

## 2022-05-13 DIAGNOSIS — G43711 Chronic migraine without aura, intractable, with status migrainosus: Secondary | ICD-10-CM

## 2022-05-27 NOTE — Addendum Note (Signed)
Addended by: Naomie Dean B on: 05/27/2022 01:55 PM   Modules accepted: Orders

## 2022-06-03 ENCOUNTER — Institutional Professional Consult (permissible substitution): Payer: Medicare Other | Admitting: Neurology

## 2022-06-03 ENCOUNTER — Encounter: Payer: Self-pay | Admitting: Neurology

## 2022-06-07 ENCOUNTER — Telehealth: Payer: Self-pay | Admitting: *Deleted

## 2022-06-07 NOTE — Telephone Encounter (Signed)
Need Bernita Raisin PA according to patient

## 2022-06-10 ENCOUNTER — Telehealth: Payer: Self-pay

## 2022-06-10 ENCOUNTER — Other Ambulatory Visit (HOSPITAL_COMMUNITY): Payer: Self-pay

## 2022-06-10 NOTE — Telephone Encounter (Signed)
A new telephone call encounter has been made for this PA Request-please see telephone note dated 06/10/2022.

## 2022-06-10 NOTE — Telephone Encounter (Signed)
Pharmacy Patient Advocate Encounter   Received notification from [employer] that prior authorization for Ubrelvy 100 MG Tablets is required/requested.   PA submitted on 06/10/2022 to (ins) [employer] via CoverMyMeds Key or (Medicaid) confirmation # [id number] Status is pending

## 2022-06-11 ENCOUNTER — Other Ambulatory Visit (HOSPITAL_COMMUNITY): Payer: Self-pay

## 2022-06-11 NOTE — Telephone Encounter (Signed)
Patient Advocate Encounter  Prior Authorization for Sonya Lopez 100mg  has been approved with [employer].    Effective dates: 06/11/22 through 06/11/23  Patient aware

## 2022-06-17 ENCOUNTER — Other Ambulatory Visit: Payer: Self-pay | Admitting: Neurology

## 2022-06-17 DIAGNOSIS — G43711 Chronic migraine without aura, intractable, with status migrainosus: Secondary | ICD-10-CM

## 2022-06-19 ENCOUNTER — Telehealth: Payer: Self-pay

## 2022-06-19 ENCOUNTER — Other Ambulatory Visit (HOSPITAL_COMMUNITY): Payer: Self-pay

## 2022-06-19 NOTE — Telephone Encounter (Signed)
Pharmacy Patient Advocate Encounter   Received notification from [employer] that prior authorization for Emgality 120MG /ML auto-injectors (migraine) is required/requested.   PA submitted on 06/19/2022 to (ins) [employer] via [employer] or [facility]) confirmation # [id number] Status is pending

## 2022-06-21 ENCOUNTER — Institutional Professional Consult (permissible substitution): Payer: Medicare Other | Admitting: Neurology

## 2022-06-22 ENCOUNTER — Ambulatory Visit (INDEPENDENT_AMBULATORY_CARE_PROVIDER_SITE_OTHER): Payer: Medicare Other | Admitting: Dermatology

## 2022-06-22 VITALS — BP 109/67 | HR 76

## 2022-06-22 DIAGNOSIS — W908XXA Exposure to other nonionizing radiation, initial encounter: Secondary | ICD-10-CM | POA: Diagnosis not present

## 2022-06-22 DIAGNOSIS — L821 Other seborrheic keratosis: Secondary | ICD-10-CM | POA: Diagnosis not present

## 2022-06-22 DIAGNOSIS — Z872 Personal history of diseases of the skin and subcutaneous tissue: Secondary | ICD-10-CM | POA: Diagnosis not present

## 2022-06-22 DIAGNOSIS — X32XXXA Exposure to sunlight, initial encounter: Secondary | ICD-10-CM

## 2022-06-22 DIAGNOSIS — L719 Rosacea, unspecified: Secondary | ICD-10-CM

## 2022-06-22 DIAGNOSIS — D220 Melanocytic nevi of lip: Secondary | ICD-10-CM

## 2022-06-22 DIAGNOSIS — Z1283 Encounter for screening for malignant neoplasm of skin: Secondary | ICD-10-CM | POA: Diagnosis not present

## 2022-06-22 DIAGNOSIS — L738 Other specified follicular disorders: Secondary | ICD-10-CM | POA: Diagnosis not present

## 2022-06-22 DIAGNOSIS — D229 Melanocytic nevi, unspecified: Secondary | ICD-10-CM

## 2022-06-22 DIAGNOSIS — D2371 Other benign neoplasm of skin of right lower limb, including hip: Secondary | ICD-10-CM | POA: Diagnosis not present

## 2022-06-22 DIAGNOSIS — D2272 Melanocytic nevi of left lower limb, including hip: Secondary | ICD-10-CM | POA: Diagnosis not present

## 2022-06-22 DIAGNOSIS — D239 Other benign neoplasm of skin, unspecified: Secondary | ICD-10-CM

## 2022-06-22 DIAGNOSIS — D2271 Melanocytic nevi of right lower limb, including hip: Secondary | ICD-10-CM

## 2022-06-22 DIAGNOSIS — L578 Other skin changes due to chronic exposure to nonionizing radiation: Secondary | ICD-10-CM | POA: Diagnosis not present

## 2022-06-22 DIAGNOSIS — L814 Other melanin hyperpigmentation: Secondary | ICD-10-CM | POA: Diagnosis not present

## 2022-06-22 NOTE — Patient Instructions (Addendum)
Instructions for [facility] Medications  One or more of your medications was sent to the [facility] mail order compounding pharmacy. You will receive an email from them and can purchase the medicine through that link. It will then be mailed to your home at the address you confirmed. If for any reason you do not receive an email from them, please check your spam folder. If you still do not find the email, please let us know. [facility] phone number is [phone].   Seborrheic Keratosis  What causes seborrheic keratoses? Seborrheic keratoses are harmless, common skin growths that first appear during adult life.  As time goes by, more growths appear.  Some people may develop a large number of them.  Seborrheic keratoses appear on both covered and uncovered body parts.  They are not caused by sunlight.  The tendency to develop seborrheic keratoses can be inherited.  They vary in color from skin-colored to gray, brown, or even black.  They can be either smooth or have a rough, warty surface.   Seborrheic keratoses are superficial and look as if they were stuck on the skin.  Under the microscope this type of keratosis looks like layers upon layers of skin.  That is why at times the top layer may seem to fall off, but the rest of the growth remains and re-grows.    Treatment Seborrheic keratoses do not need to be treated, but can easily be removed in the office.  Seborrheic keratoses often cause symptoms when they rub on clothing or jewelry.  Lesions can be in the way of shaving.  If they become inflamed, they can cause itching, soreness, or burning.  Removal of a seborrheic keratosis can be accomplished by freezing, burning, or surgery. If any spot bleeds, scabs, or grows rapidly, please return to have it checked, as these can be an indication of a skin cancer.      Melanoma ABCDEs  Melanoma is the most dangerous type of skin cancer, and is the leading cause of death from skin disease.   You are more likely to develop melanoma if you: Have light-colored skin, light-colored eyes, or red or blond hair Spend a lot of time in the sun Tan regularly, either outdoors or in a tanning bed Have had blistering sunburns, especially during childhood Have a close family member who has had a melanoma Have atypical moles or large birthmarks  Early detection of melanoma is key since treatment is typically straightforward and cure rates are extremely high if we catch it early.   The first sign of melanoma is often a change in a mole or a new dark spot.  The ABCDE system is a way of remembering the signs of melanoma.  A for asymmetry:  The two halves do not match. B for border:  The edges of the growth are irregular. C for color:  A mixture of colors are present instead of an even brown color. D for diameter:  Melanomas are usually (but not always) greater than 6mm - the size of a pencil eraser. E for evolution:  The spot keeps changing in size, shape, and color.  Please check your skin once per month between visits. You can use a small mirror in front and a large mirror behind you to keep an eye on the back side or your body.   If you see any new or changing lesions before your next follow-up, please call to schedule a visit.  Please continue daily skin protection including   broad spectrum sunscreen SPF 30+ to sun-exposed areas, reapplying every 2 hours as needed when you're outdoors.   Staying in the shade or wearing long sleeves, sun glasses (UVA+UVB protection) and wide brim hats (4-inch brim around the entire circumference of the hat) are also recommended for sun protection.    Due to recent changes in healthcare laws, you may see results of your pathology and/or laboratory studies on MyChart before the doctors have had a chance to review them. We understand that in some cases there may be results that are confusing or concerning to you. Please understand that not all results are  received at the same time and often the doctors may need to interpret multiple results in order to provide you with the best plan of care or course of treatment. Therefore, we ask that you please give us 2 business days to thoroughly review all your results before contacting the office for clarification. Should we see a critical lab result, you will be contacted sooner.   If You Need Anything After Your Visit  If you have any questions or concerns for your doctor, please call our main line at [phone] and press option 4 to reach your doctor's medical assistant. If no one answers, please leave a voicemail as directed and we will return your call as soon as possible. Messages left after 4 pm will be answered the following business day.   You may also send us a message via MyChart. We typically respond to MyChart messages within 1-2 business days.  For prescription refills, please ask your pharmacy to contact our office. Our fax number is [phone].  If you have an urgent issue when the clinic is closed that cannot wait until the next business day, you can page your doctor at the number below.    Please note that while we do our best to be available for urgent issues outside of office hours, we are not available 24/7.   If you have an urgent issue and are unable to reach us, you may choose to seek medical care at your doctor's office, retail clinic, urgent care center, or emergency room.  If you have a medical emergency, please immediately call 911 or go to the emergency department.  Pager Numbers  - Dr. Kowalski: [phone]  - Dr. Moye: [phone]  - Dr. Stewart: [phone]  In the event of inclement weather, please call our main line at [phone] for an update on the status of any delays or closures.  Dermatology Medication Tips: Please keep the boxes that topical medications come in in order to help keep track of the instructions about where and how to use these.  Pharmacies typically print the medication instructions only on the boxes and not directly on the medication tubes.   If your medication is too expensive, please contact our office at [phone] option 4 or send us a message through MyChart.   We are unable to tell what your co-pay for medications will be in advance as this is different depending on your insurance coverage. However, we may be able to find a substitute medication at lower cost or fill out paperwork to get insurance to cover a needed medication.   If a prior authorization is required to get your medication covered by your insurance company, please allow us 1-2 business days to complete this process.  Drug prices often vary depending on where the prescription is filled and some pharmacies may offer cheaper prices.  The website www.goodrx.com contains coupons for   medications through different pharmacies. The prices here do not account for what the cost may be with help from insurance (it may be cheaper with your insurance), but the website can give you the price if you did not use any insurance.  - You can print the associated coupon and take it with your prescription to the pharmacy.  - You may also stop by our office during regular business hours and pick up a GoodRx coupon card.  - If you need your prescription sent electronically to a different pharmacy, notify our office through [place] MyChart or by phone at [phone] option 4.     Si Usted Necesita Algo Despus de Su Visita  Tambin puede enviarnos un mensaje a travs de MyChart. Por lo general respondemos a los mensajes de MyChart en el transcurso de 1 a 2 das hbiles.  Para renovar recetas, por favor pida a su farmacia que se ponga en contacto con nuestra oficina. Nuestro nmero de fax es el [phone].  Si tiene un asunto urgente cuando la clnica est cerrada y que no puede esperar hasta el siguiente da hbil, puede llamar/localizar a su doctor(a) al nmero  que aparece a continuacin.   Por favor, tenga en cuenta que aunque hacemos todo lo posible para estar disponibles para asuntos urgentes fuera del horario de oficina, no estamos disponibles las 24 horas del da, los 7 das de la semana.   Si tiene un problema urgente y no puede comunicarse con nosotros, puede optar por buscar atencin mdica  en el consultorio de su doctor(a), en una clnica privada, en un centro de atencin urgente o en una sala de emergencias.  Si tiene una emergencia mdica, por favor llame inmediatamente al 911 o vaya a la sala de emergencias.  Nmeros de bper  - Dr. Kowalski: [phone]  - Dra. Moye: [phone]  - Dra. Stewart: [phone]  En caso de inclemencias del tiempo, por favor llame a nuestra lnea principal al [phone] para una actualizacin sobre el estado de cualquier retraso o cierre.  Consejos para la medicacin en dermatologa: Por favor, guarde las cajas en las que vienen los medicamentos de uso tpico para ayudarle a seguir las instrucciones sobre dnde y cmo usarlos. Las farmacias generalmente imprimen las instrucciones del medicamento slo en las cajas y no directamente en los tubos del medicamento.   Si su medicamento es muy caro, por favor, pngase en contacto con nuestra oficina llamando al [phone] y presione la opcin 4 o envenos un mensaje a travs de MyChart.   No podemos decirle cul ser su copago por los medicamentos por adelantado ya que esto es diferente dependiendo de la cobertura de su seguro. Sin embargo, es posible que podamos encontrar un medicamento sustituto a menor costo o llenar un formulario para que el seguro cubra el medicamento que se considera necesario.   Si se requiere una autorizacin previa para que su compaa de seguros cubra su medicamento, por favor permtanos de 1 a 2 das hbiles para completar este proceso.  Los precios de los medicamentos varan con frecuencia dependiendo del lugar de dnde se  surte la receta y alguna farmacias pueden ofrecer precios ms baratos.  El sitio web www.goodrx.com tiene cupones para medicamentos de diferentes farmacias. Los precios aqu no tienen en cuenta lo que podra costar con la ayuda del seguro (puede ser ms barato con su seguro), pero el sitio web puede darle el precio si no utiliz ningn seguro.  - Puede imprimir el cupn correspondiente y   llevarlo con su receta a la farmacia.  - Tambin puede pasar por nuestra oficina durante el horario de atencin regular y recoger una tarjeta de cupones de GoodRx.  - Si necesita que su receta se enve electrnicamente a una farmacia diferente, informe a nuestra oficina a travs de MyChart de [place] o por telfono llamando al [phone] y presione la opcin 4.  

## 2022-06-22 NOTE — Progress Notes (Signed)
Follow-Up Visit   Subjective  Sonya Lopez is a 67 y.o. female who presents for the following: Skin Cancer Screening and Full Body Skin Exam Hx of aks, hx of rosacea using skin medicinals mix with the perfect A cream.  The patient presents for Total-Body Skin Exam (TBSE) for skin cancer screening and mole check. The patient has spots, moles and lesions to be evaluated, some may be new or changing and the patient has concerns that these could be cancer.  The following portions of the chart were reviewed this encounter and updated as appropriate: medications, allergies, medical history  Review of Systems:  No other skin or systemic complaints except as noted in HPI or Assessment and Plan.  Objective  Well appearing patient in no apparent distress; mood and affect are within normal limits.  A full examination was performed including scalp, head, eyes, ears, nose, lips, neck, chest, axillae, abdomen, back, buttocks, bilateral upper extremities, bilateral lower extremities, hands, feet, fingers, toes, fingernails, and toenails. All findings within normal limits unless otherwise noted below.   Relevant physical exam findings are noted in the Assessment and Plan.    Assessment & Plan   LENTIGINES, SEBORRHEIC KERATOSES, HEMANGIOMAS - Benign normal skin lesions - Benign-appearing - Call for any changes Sk at left cheek   Sebaceous Hyperplasia face - Small yellow papules with a central dell - Benign-appearing - Observe. Call for changes.  MELANOCYTIC NEVI - Tan-brown and/or pink-flesh-colored symmetric macules and papules - Benign appearing on exam today - Observation - Call clinic for new or changing moles - Recommend daily use of broad spectrum spf 30+ sunscreen to sun-exposed areas.  Nevus (2) Right Popliteal Fossa 2.66mm medium dark brown macule   Left Thigh - Posterior 1.51mm med dark brown macule  Right oral commissure  5 x 3 mm pink flesh papule    Benign-appearing.  Stable. Observation.  Call clinic for new or changing lesions.  Recommend daily use of broad spectrum spf 30+ sunscreen to sun-exposed areas.   Rosacea face  Exam : telangectasia with mild erythema at malar cheeks and nose  Chronic condition with duration or expected duration over one year. Currently well-controlled.    Rosacea is a chronic progressive skin condition usually affecting the face of adults, causing redness and/or acne bumps. It is treatable but not curable. It sometimes affects the eyes (ocular rosacea) as well. It may respond to topical and/or systemic medication and can flare with stress, sun exposure, alcohol, exercise and some foods.  Daily application of broad spectrum spf 30+ sunscreen to face is recommended to reduce flares.   Recommend Elta MD clear tinted daily to face. Continue The Perfect A qhs as tolerated  Continue [facility] Azelaic Acid: 15%, Niacinamide: 2% cr qam   Dermatofibroma. Right lateral ankle. - Firm pink/brown papulenodule with dimple sign - Benign appearing - Call for any changes  ACTINIC DAMAGE - Chronic condition, secondary to cumulative UV/sun exposure - diffuse scaly erythematous macules with underlying dyspigmentation - Recommend daily broad spectrum sunscreen SPF 30+ to sun-exposed areas, reapply every 2 hours as needed.  - Staying in the shade or wearing long sleeves, sun glasses (UVA+UVB protection) and wide brim hats (4-inch brim around the entire circumference of the hat) are also recommended for sun protection.  - Call for new or changing lesions.  SKIN CANCER SCREENING PERFORMED TODAY.   Return in about 1 year (around 06/22/2023) for TBSE.  I, Asher Muir, CMA, am acting as Neurosurgeon for [employer]  Roseanne Reno, MD.   Documentation: I have reviewed the above documentation for accuracy and completeness, and I agree with the above.  Willeen Niece, MD

## 2022-06-22 NOTE — Telephone Encounter (Signed)
[name] from [facility] called stating that the Emgality has been approved from 06/21/22-06/21/23

## 2022-07-05 MED ORDER — UBRELVY 100 MG PO TABS: 100.0000 mg | ORAL_TABLET | ORAL | 11 refills | Status: DC | PRN

## 2022-07-14 ENCOUNTER — Other Ambulatory Visit: Payer: Self-pay | Admitting: Physician Assistant

## 2022-07-14 DIAGNOSIS — I4892 Unspecified atrial flutter: Secondary | ICD-10-CM

## 2022-07-20 ENCOUNTER — Encounter: Payer: Self-pay | Admitting: Neurology

## 2022-07-20 ENCOUNTER — Ambulatory Visit (INDEPENDENT_AMBULATORY_CARE_PROVIDER_SITE_OTHER): Payer: Medicare Other | Admitting: Neurology

## 2022-07-20 VITALS — BP 121/57 | HR 56 | Ht 66.0 in | Wt 231.0 lb

## 2022-07-20 DIAGNOSIS — R519 Headache, unspecified: Secondary | ICD-10-CM | POA: Diagnosis not present

## 2022-07-20 DIAGNOSIS — G4763 Sleep related bruxism: Secondary | ICD-10-CM | POA: Insufficient documentation

## 2022-07-20 DIAGNOSIS — G4734 Idiopathic sleep related nonobstructive alveolar hypoventilation: Secondary | ICD-10-CM | POA: Insufficient documentation

## 2022-07-20 DIAGNOSIS — G4733 Obstructive sleep apnea (adult) (pediatric): Secondary | ICD-10-CM | POA: Diagnosis not present

## 2022-07-20 NOTE — Progress Notes (Signed)
Provider:  Melvyn Novas, MD  Primary Care Physician:  [name], MD [address] [place] [address]     Referring Provider: Anson Fret, Md [address] [place],  [place] [address]          Chief Complaint according to patient   Patient presents with:     New Patient (Initial Visit)      I have had the pleasure of meeting Sonya Lopez in the years past, and referred her for pulmonary evaluation. She was not asked to bring her CPAP to this appointment.        HISTORY OF PRESENT ILLNESS:  Sonya Lopez is a 67 y.o. female patient who is here for revisit 07/20/2022 for sleep apnea. She was not asked to bring her CPAP to this appointment nd I have no recent data to see.  CPAP was issued by pulmonologist, see below.    I had last seen Sonya Lopez in the year 2020 well over 3 years ago so she is considered today a new patient for me.  At the time she underwent a polysomnography at [facility] sleep at [facility] dated 6-16 2020.  This study showed bradycardia and hypoxia there were no significant.  Limb movements she did not stay awake for 75 minutes during the sleep study after having fallen asleep initially.  Total sleep time was only 320 minutes.  Was only 37 minutes and REM sleep.  The patient had suffered from atrial fibrillation for years.  At the time she was on amiodarone.  The total AHI for this patient was 4.5/h so she just missed the 5/h mark that would have qualified her for CPAP intervention.    Since her PSG had data confirming bradycardia and hypoxia and all apneas recorded were strongly dependent on supine sleep position she was referred to a pulmonologist who then repeated a home sleep test and instructed the patient to sleep on her back.  After that home sleep test she qualified for CPAP but she struggled with tolerating the interface.  There were several masks tried and she had compliantly used it and but she did not have  ongoing follow-up with that sleep specialist-pulmonologist.    CPAP had definitely helped with headaches with fatigue and she was then referred for a dental appliance to Dr. Jeani Sow did provide and made to measure dental guard and the patient is now able to use her CPAP with this dental guard protecting her from bruxism and has much better results on this pulmonary therapy.  Her Epworth Sleepiness Scale currently is at 6 points her fatigue severity at 32 points, at the geriatric depression score only 4 out of 15 points are endorsed not suggestive of depression being present.  Her CPAP machine would now be about 69 years old and is not due to be replaced.  She reports that Dr. Toni Arthurs will repeat a home sleep test while the patient uses her dental appliance and uses CPAP to confirm that it is effective. If she isn't using her CPAP and only used the dental device, she wakes again with headaches.  Atrial fib is now resolved  after ablation. No longer on amiodarone. She reports achiness and soreness in muscles but attributed these to fibromyalgia.     PS :She was not asked to bring her CPAP to this appointment.      Review of Systems: Out of a complete 14 system  review, the patient complains of only the following symptoms, and all other reviewed systems are negative.:  Fatigue, sleepiness , snoring, bruxism, TMJ improving.    How likely are you to doze in the following situations: 0 = not likely, 1 = slight chance, 2 = moderate chance, 3 = high chance   Sitting and Reading? Watching Television? Sitting inactive in a public place (theater or meeting)? As a passenger in a car for an hour without a break? Lying down in the afternoon when circumstances permit? Sitting and talking to someone? Sitting quietly after lunch without alcohol? In a car, while stopped for a few minutes in traffic?   Total = 6/ 24 points   FSS endorsed at 32/ 63 points.   Right side TMJ, bruxism  history  Social History   Socioeconomic History   Marital status: Married    Spouse name: Theatre manager    Number of children: 3   Years of education: 16   Highest education level: Bachelor's degree (e.g., BA, AB, BS)  Occupational History   Occupation: Diplomatic Services operational officer  Tobacco Use   Smoking status: Never   Smokeless tobacco: Never  Vaping Use   Vaping Use: Never used  Substance and Sexual Activity   Alcohol use: Not Currently    Comment: Due to AF   Drug use: No   Sexual activity: Yes    Partners: Male    Birth control/protection: Surgical  Other Topics Concern   Not on file  Social History Narrative   Lives at home with husband    Right handed   Caffeine: about 1 cup daily   Social Determinants of Health   Financial Resource Strain: Low Risk  (06/23/2021)   Overall Financial Resource Strain (CARDIA)    Difficulty of Paying Living Expenses: Not hard at all  Food Insecurity: No Food Insecurity (06/23/2021)   Hunger Vital Sign    Worried About Running Out of Food in the Last Year: Never true    Ran Out of Food in the Last Year: Never true  Transportation Needs: No Transportation Needs (06/23/2021)   PRAPARE - Administrator, Civil Service (Medical): No    Lack of Transportation (Non-Medical): No  Physical Activity: Sufficiently Active (06/23/2021)   Exercise Vital Sign    Days of Exercise per Week: 5 days    Minutes of Exercise per Session: 30 min  Stress: No Stress Concern Present (06/23/2021)   Sonya Lopez of Occupational Health - Occupational Stress Questionnaire    Feeling of Stress : Not at all  Social Connections: Socially Integrated (06/23/2021)   Social Connection and Isolation Panel [NHANES]    Frequency of Communication with Friends and Family: More than three times a week    Frequency of Social Gatherings with Friends and Family: More than three times a week    Attends Religious Services: More than 4 times per year    Active Member of [employer] or  Organizations: Yes    Attends Engineer, structural: More than 4 times per year    Marital Status: Married    Family History  Problem Relation Age of Onset   Diabetes Mother    Hearing loss Mother    Hypertension Mother    Stroke Mother    Kidney disease Mother    Sleep apnea Mother    Obesity Mother    Heart disease Father    Heart failure Father    Stroke Father    Depression Father  ADD / ADHD Father    Arthritis Father    Heart disease Paternal Grandfather    Asthma Daughter    ADD / ADHD Daughter    Asthma Daughter    Migraines Daughter    Fibromyalgia Daughter    Interstitial cystitis Daughter    Asthma Daughter    Colitis Daughter    Thyroid cancer Daughter 37   Diabetes Brother    Hypertension Brother    Breast cancer Neg Hx     Past Medical History:  Diagnosis Date   Actinic keratosis    Allergy    Anxiety    situational anxiety   Arrhythmia    A-fib/A-flutter   Atrial flutter (HCC)    Breast neoplasm, Tis (DCIS), 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  MammoSite   Calculus of kidney    Cancer (HCC) 2017   Left- DCIS   Depression    Diet-controlled type 2 diabetes mellitus (HCC)    Difficult intubation 2009   [facility]- anes had to use  'boogee" DURING GALLBLADDER SURGERY ONLY-NO PROBLEMS SINCE   Female cystocele 01/05/2011   Fibromyalgia    Gallbladder problem    GERD (gastroesophageal reflux disease)    Headache(784.0)    MIGRAINES   History of kidney stones    IBS (irritable bowel syndrome)    Joint pain    Nocturnal hypoxemia 05/09/2022   Obesity    OSA (obstructive sleep apnea) 03/27/2019   USE CPAP   OSA (obstructive sleep apnea)    Personal history of radiation therapy 2017   LEFT lumpectomy   PONV (postoperative nausea and vomiting)    h/o difficult intubation and anes had to use "boogee"   Sleep apnea 2020   Vitamin D deficiency    Vitreous detachment of right eye     sees floaters    Past Surgical History:  Procedure Laterality Date   ABDOMINAL HYSTERECTOMY  2013   ANTERIOR AND POSTERIOR REPAIR  11/30/2011   Procedure: ANTERIOR (CYSTOCELE) AND POSTERIOR REPAIR (RECTOCELE);  Surgeon: Martina Sinner, MD;  Location: [facility];  Service: Urology;  Laterality: N/A;  with cysto   ANTERIOR CERVICAL DECOMP/DISCECTOMY FUSION N/A 10/24/2019   Procedure: ANTERIOR CERVICAL DECOMPRESSION/DISCECTOMY FUSION 2 LEVELS C5-7;  Surgeon: Venetia Night, MD;  Location: [facility] ORS;  Service: Neurosurgery;  Laterality: N/A;   ATRIAL FIBRILLATION ABLATION N/A 03/01/2019   Procedure: ATRIAL FIBRILLATION ABLATION;  Surgeon: Regan Lemming, MD;  Location: [facility];  Service: Cardiovascular;  Laterality: N/A;   BLADDER SUSPENSION  11/30/2011   Procedure: SPARC PROCEDURE;  Surgeon: Martina Sinner, MD;  Location: [facility];  Service: Urology;  Laterality: N/A;  graft 10x6   BREAST BIOPSY Right 2007   negative core   BREAST BIOPSY Left 09/16/2015   DCIS   BREAST LUMPECTOMY Left 09/30/2015   DCIS clear margins and rad tx/HIGH GRADE DUCTAL CARCINOMA IN SITU, COMEDO TYPE   BREAST LUMPECTOMY WITH NEEDLE LOCALIZATION Left 09/30/2015   DCIS excision, MammoSite radiation;  Surgeon: Earline Mayotte, MD;  Location: [facility] ORS;  Service: General;  Laterality: Left;   CATARACT EXTRACTION Left 01/2019   CESAREAN SECTION     x 1   CHOLECYSTECTOMY  2005   COLONOSCOPY  2009   CYSTOSCOPY  11/30/2011   Procedure: CYSTOSCOPY;  Surgeon: Martina Sinner, MD;  Location: [facility];  Service: Urology;;   DILATION AND CURETTAGE OF UTERUS  05/05/2007   GALLBLADDER SURGERY  2004   SPINE SURGERY  2022   TONSILLECTOMY  1961   VAGINAL HYSTERECTOMY  11/30/2011   Procedure: HYSTERECTOMY VAGINAL;  Surgeon: Allie Bossier, MD;  Location: [facility];  Service: Gynecology;  Laterality: N/A;     Current Outpatient Medications on File Prior to Visit  Medication Sig Dispense Refill    acetaminophen (TYLENOL) 500 MG tablet Take 2 tablets (1,000 mg total) by mouth every 8 (eight) hours. OTC  0   Cholecalciferol (PA VITAMIN D-3 GUMMY PO) Takes 2 gummies daily     diltiazem (CARDIZEM) 30 MG tablet Take 1 tablet (30 mg total) by mouth 4 (four) times daily as needed (atrial fibrillation or elevated heart rates). 30 tablet 2   DULoxetine (CYMBALTA) 20 MG capsule TAKE 2 CAPSULES BY MOUTH EVERY DAY 180 capsule 3   ELDERBERRY PO Take by mouth.     ELIQUIS 5 MG TABS tablet TAKE ONE (1) TABLET BY MOUTH TWO TIMES PER DAY 180 tablet 3   FINACEA 15 % FOAM Apply 1 application topically daily as needed (rosacea).      Galcanezumab-gnlm (EMGALITY) 120 MG/ML SOAJ INJECT INTO THE SKIN EVERY 30 DAYS AS DIRECTED 1 mL 11   magnesium oxide (MAG-OX) 400 MG tablet Take 400 mg by mouth every morning.     methocarbamol (ROBAXIN) 500 MG tablet Take 1 tablet (500 mg total) by mouth every 6 (six) hours as needed for muscle spasms. 90 tablet 0   metoprolol succinate (TOPROL-XL) 25 MG 24 hr tablet TAKE 1 TABLET BY MOUTH DAILY 90 tablet 2   omeprazole (PRILOSEC) 20 MG capsule Take 1 capsule (20 mg total) by mouth daily. 90 capsule 3   Probiotic Product (PROBIOTIC PO) Take 1 capsule by mouth daily.      rizatriptan (MAXALT-MLT) 10 MG disintegrating tablet DISSOLVE 1 TABLET UNDER THE TONGUE AS NEEDED FOR MIGRAINE. MAY REPEAT IN 2 HOURS IF NEEDED. 27 tablet 4   trolamine salicylate (ASPERCREME) 10 % cream Apply 1 application topically as needed.     Ubrogepant (UBRELVY) 100 MG TABS Take 1 tablet (100 mg total) by mouth every 2 (two) hours as needed. Maximum 200mg  a day. 16 tablet 11   VITAMIN D PO Take by mouth. gummy     No current facility-administered medications on file prior to visit.    Allergies  Allergen Reactions   Adhesive [Tape] Dermatitis    SKIN TURNS RED-PAPER TAPE OK TO USE   Claritin [Loratadine] Swelling   Macrodantin [Nitrofurantoin] Nausea And Vomiting   Metronidazole Nausea Only    Nitrofurantoin Macrocrystal Nausea And Vomiting   Sulfamethoxazole-Trimethoprim Nausea Only   Cephalexin Rash   Clindamycin Hcl Rash   Doxycycline Swelling and Rash   Erythromycin Rash     DIAGNOSTIC DATA (LABS, IMAGING, TESTING) - I reviewed patient records, labs, notes, testing and imaging myself where available.  Lab Results  Component Value Date   WBC 6.9 03/13/2021   HGB 12.8 03/13/2021   HCT 39.5 03/13/2021   MCV 93.2 03/13/2021   PLT 316 03/13/2021      Component Value Date/Time   NA 140 09/10/2021 1020   NA 141 07/01/2020 1015   NA 141 04/17/2011 0507   K 4.9 09/10/2021 1020   K 4.2 04/17/2011 0507   CL 103 09/10/2021 1020   CL 107 04/17/2011 0507   CO2 30 09/10/2021 1020   CO2 28 04/17/2011 0507   GLUCOSE 77 09/10/2021 1020   GLUCOSE 90 04/17/2011 0507   BUN 22 09/10/2021  1020   BUN 17 07/01/2020 1015   BUN 16 04/17/2011 0507   CREATININE 0.97 09/10/2021 1020   CALCIUM 9.4 09/10/2021 1020   CALCIUM 9.2 04/17/2011 0507   PROT 6.5 09/10/2021 1020   PROT 6.7 07/01/2020 1015   PROT 8.0 04/16/2011 1250   ALBUMIN 3.4 (L) 02/02/2021 0917   ALBUMIN 4.3 07/01/2020 1015   ALBUMIN 4.2 04/16/2011 1250   AST 15 09/10/2021 1020   AST 30 04/16/2011 1250   ALT 11 09/10/2021 1020   ALT 31 04/16/2011 1250   ALKPHOS 113 02/02/2021 0917   ALKPHOS 150 (H) 04/16/2011 1250   BILITOT 0.3 09/10/2021 1020   BILITOT 0.3 07/01/2020 1015   BILITOT 0.3 04/16/2011 1250   GFRNONAA >60 02/02/2021 0917   GFRNONAA 57 (L) 09/11/2019 1109   GFRAA >60 10/17/2019 1034   GFRAA 66 09/11/2019 1109   Lab Results  Component Value Date   CHOL 187 09/10/2021   HDL 53 09/10/2021   LDLCALC 112 (H) 09/10/2021   TRIG 111 09/10/2021   CHOLHDL 3.5 09/10/2021   Lab Results  Component Value Date   HGBA1C 5.6 09/10/2021   Lab Results  Component Value Date   VITAMINB12 273 09/10/2021   Lab Results  Component Value Date   TSH 1.860 07/01/2020    PHYSICAL EXAM:  Today's Vitals    07/20/22 1002  BP: (!) 121/57  Pulse: (!) 56  Weight: 231 lb (104.8 kg)  Height: 5\' 6"  (1.676 m)   Body mass index is 37.28 kg/m.   Wt Readings from Last 3 Encounters:  07/20/22 231 lb (104.8 kg)  05/05/22 229 lb (103.9 kg)  03/04/22 220 lb (99.8 kg)     Ht Readings from Last 3 Encounters:  07/20/22 5\' 6"  (1.676 m)  05/05/22 5\' 6"  (1.676 m)  03/04/22 5\' 6"  (1.676 m)      General: The patient is awake, alert and appears not in acute distress. The patient is well groomed. Head: Normocephalic, atraumatic. Neck is supple. Mallampati 3 plus ,  neck circumference:14. 8 inches . Nasal airflow  patent.  Retrognathia is seen.  Dental status:  biological , bruxism.  Cardiovascular:  Regular rate and cardiac rhythm by pulse,  without distended neck veins. Respiratory: Lungs are clear to auscultation.  Skin:  Without evidence of ankle edema, or rash. Trunk: [name] posture is erect.   NEUROLOGIC EXAM: The patient is awake and alert, oriented to place and time.   Memory subjective described as intact.  Attention span & concentration ability appears normal.  Speech is fluent,  without  dysarthria, dysphonia or aphasia.  Mood and affect are appropriate.   Cranial nerves: Pupils are equal in size and round. Extraocular movements  in vertical and horizontal planes intact. Beginning cataracts .Facial motor strength is symmetric and tongue and uvula move midline.  Shoulder shrug was symmetrical.    Motor exam:  Normal muscle bulk and symmetric ROM ( range of movement) in upper/extremities.  Coordination: Rapid alternating movements in the fingers/hands were normal. Finger-to-nose maneuver demonstrated no evidence of ataxia, dysmetria or tremor.   Gait and station: Patient could rise unassisted from a seated position, walked without assistive device.  Stance is of normal width/ base and the patient turned with 3 steps.  Toe and heel walk were deferred.  Deep tendon reflexes: in the  upper and lower extremities are symmetric and intact.  Babinski response was deferred .   [facility] Consultation  [phone]  Assessment and Plan:   Obstructive sleep apnea. -Mild OSA with AHI of 11.   Atrial fibrillation, depression, GERD. - Sleep apnea can contribute to above conditions, therefore treatment of sleep apnea is an important part of their management.   Possible COPD. - Patient is asymptomatic, however she does have hyperinflation of lungs on imaging.  She is a lifelong non-smoker, but [place], she has significant secondhand smoke exposure as her father smoked 3 packs/day. - We will check a pulmonary function test, alpha-1.   Nocturnal hypoxemia. - Mild hypoxemia was seen overnight on the recent sleep study of uncertain etiology, patient denies dyspnea symptoms.  Suspect likely to desats from sleep apnea.    Orders Placed This Encounter  Procedures   Alpha-1-antitrypsin   Alpha-1 antitrypsin phenotype   Pulmonary Function Test [facility] Only    Return in about 6 months (around 04/01/2019)    ASSESSMENT AND PLAN 67 - year old female  patient of Dr Trevor Mace here to reconnect with sleep medicine care. She has received a CPAP from Pulmonology, and has not been followed  up form there in a while. Unfortunately, she was not asked to bring her CPAP to [name] today. She is using a dental  guard per Dr Toni Arthurs, and  feels that this combination therapy has benefited daytime fatigue and sleep quality.  She has noticed significant benefit in headache reduction but cannot get these results with either therapy on her own.   I am awaiting Dr Lonia Chimera planned HST on CPAP and on dental device.  I have asked the patient to bring her CPAP to her next appointment.        1) Rv in 5 months with intent to review HST and see also compliance and therapeutic data from CPAP machine, new CPAP is due 1/ 2026.   I plan to follow up either personally or through  our NP within 5 months.   I would like to thank Anson Fret, Md [address] [place],  [place] [address] for allowing me to meet with and to take care of this pleasant patient.    After spending a total time of  40  minutes face to face and additional time for physical and neurologic examination, review of laboratory studies,  personal review of imaging studies, reports and results of other testing and review of referral information / records as far as provided in visit,   Electronically signed by: Melvyn Novas, MD 07/20/2022 10:29 AM  [facility] Board certified by [employer] of Sleep Medicine and Diplomate of the [employer] of Sleep Medicine. Board certified In Neurology through the ABPN, Fellow of the [employer] of Neurology. Medical Director of [facility].

## 2022-07-20 NOTE — Patient Instructions (Signed)
Rv in 5 months with dr Lonia Chimera HST results and with cpap .

## 2022-09-10 NOTE — Progress Notes (Unsigned)
Name: Sonya Lopez   MRN: [id number]    DOB: 16-Jan-1956   Date:09/10/2022       Progress Note  Subjective  Chief Complaint  Follow Up  HPI  OSA: Dr. Lucia Gaskins ordered sleep study 07/2018, showed mild OSA, but had hypoxia of 82% and was seeing a pulmonologist but last visit was last year. She  is compliant with CPAP, no longer waking up frequently with headaches, sleeps better at night and wakes up feeling refreshed . Stable    FMS: she is off gabapentin , pain is stable.   Paroxysmal Afib:  she had syncope twice on April 4th, 2020 Dr. Mariah Milling saw her on April 8th, 2020 tried medication but did not control symptoms, seen by Dr. Gerre Pebbles, she had ablation and is feeling better, still take Metoprolol and Eliquis daily , prn cardizem - but not lately since asymptomatic for a long time   Migraine Headaches: under the care of Dr. Lucia Gaskins, currently on [facility], has an injection once a month and states the day before her dose she noticed a mild headache, doing very well on current regiment and denies side effects   Major Depression  Remission : she is doing better now, Phq 9 is negative , her youngest daughter got married in July 2023, they are empty nesters now . She is not taking any medications at this time and is feeling well    History diabetes  : last abnormal A1C was many years ago. She had labs done in May and A1C was 5.9 % , on diet only, we will recheck A1C today    GERD: under control with medication, discussed long term medication use. She takes Omeprazole and needs a refill    History of left breast cancer: she had lumpectomy, could not tolerate estrogen suppressive therapy. mammogram is up to date -Dec 2022 , she is seeing Dr. Birdie Sons  yearly.    Morbid Obesity: BMI above 35, with co-morbidities such as GERD , OSA, OA. She had COVID-19 in January 23 , she  lost some weigh initially but got back to baseline, since last visit 6 months ago she has lost weight again and happy with it      DDD cervical spine: She had right upper extremity radiculitis, she has cervical fusion 10/2019 and noticing some left side neck pain again  Patient Active Problem List   Diagnosis Date Noted   Sleep-related headache 07/20/2022   Sleep-related hypoxia 07/20/2022   Sleep related bruxism 07/20/2022   Nocturnal hypoxemia 05/09/2022   Chronic migraine without aura, with intractable migraine, so stated, with status migrainosus 05/09/2022   Cervical spondylosis with radiculopathy 05/09/2022   Sleep apnea 05/09/2022   Hypotension 01/31/2021   History of diabetes mellitus, type II 09/11/2019   OSA on CPAP 03/27/2019   Syncope 05/10/2018   Pain, elbow joint 02/02/2018   PAF (paroxysmal atrial fibrillation) (HCC) 09/13/2017   Trochanteric bursitis of left hip 06/24/2017   Low back pain 02/22/2017   Benign breast cyst in female, right 05/25/2016   History of left breast cancer 09/16/2015   Long-term use of high-risk medication 02/25/2015   Mild major depression (HCC) 07/29/2014   Gastric reflux 07/29/2014   Chronic interstitial cystitis 07/29/2014   Calculus of kidney 07/29/2014   Allergic rhinitis 07/29/2014   Acne erythematosa 07/29/2014   Lumbosacral radiculitis 07/29/2014   Tinnitus 07/29/2014   Mixed incontinence, urge and stress (female) (female) 08/23/2013   Hyperlipidemia 04/25/2013   H/O: hysterectomy 11/30/2011  Atrial flutter (HCC) 05/03/2011   Bladder cystocele 01/05/2011   Migraine without aura and without status migrainosus, not intractable 12/29/2010   Fibromyalgia 12/29/2010   Obesity 12/29/2010    Past Surgical History:  Procedure Laterality Date   ABDOMINAL HYSTERECTOMY  2013   ANTERIOR AND POSTERIOR REPAIR  11/30/2011   Procedure: ANTERIOR (CYSTOCELE) AND POSTERIOR REPAIR (RECTOCELE);  Surgeon: Martina Sinner, MD;  Location: [facility];  Service: Urology;  Laterality: N/A;  with cysto   ANTERIOR CERVICAL DECOMP/DISCECTOMY FUSION N/A 10/24/2019   Procedure:  ANTERIOR CERVICAL DECOMPRESSION/DISCECTOMY FUSION 2 LEVELS C5-7;  Surgeon: Venetia Night, MD;  Location: [facility];  Service: Neurosurgery;  Laterality: N/A;   ATRIAL FIBRILLATION ABLATION N/A 03/01/2019   Procedure: ATRIAL FIBRILLATION ABLATION;  Surgeon: Regan Lemming, MD;  Location: [facility];  Service: Cardiovascular;  Laterality: N/A;   BLADDER SUSPENSION  11/30/2011   Procedure: SPARC PROCEDURE;  Surgeon: Martina Sinner, MD;  Location: [facility];  Service: Urology;  Laterality: N/A;  graft 10x6   BREAST BIOPSY Right 2007   negative core   BREAST BIOPSY Left 09/16/2015   DCIS   BREAST LUMPECTOMY Left 09/30/2015   DCIS clear margins and rad tx/HIGH GRADE DUCTAL CARCINOMA IN SITU, COMEDO TYPE   BREAST LUMPECTOMY WITH NEEDLE LOCALIZATION Left 09/30/2015   DCIS excision, MammoSite radiation;  Surgeon: Earline Mayotte, MD;  Location: [facility];  Service: General;  Laterality: Left;   CATARACT EXTRACTION Left 01/2019   CESAREAN SECTION     x 1   CHOLECYSTECTOMY  2005   COLONOSCOPY  2009   CYSTOSCOPY  11/30/2011   Procedure: CYSTOSCOPY;  Surgeon: Martina Sinner, MD;  Location: [facility];  Service: Urology;;   DILATION AND CURETTAGE OF UTERUS  05/05/2007   GALLBLADDER SURGERY  2004   SPINE SURGERY  2022   TONSILLECTOMY  1961   VAGINAL HYSTERECTOMY  11/30/2011   Procedure: HYSTERECTOMY VAGINAL;  Surgeon: Allie Bossier, MD;  Location: [facility];  Service: Gynecology;  Laterality: N/A;    Family History  Problem Relation Age of Onset   Diabetes Mother    Hearing loss Mother    Hypertension Mother    Stroke Mother    Kidney disease Mother    Sleep apnea Mother    Obesity Mother    Heart disease Father    Heart failure Father    Stroke Father    Depression Father    ADD / ADHD Father    Arthritis Father    Heart disease Paternal Grandfather    Asthma Daughter    ADD / ADHD Daughter    Asthma Daughter    Migraines Daughter    Fibromyalgia Daughter     Interstitial cystitis Daughter    Asthma Daughter    Colitis Daughter    Thyroid cancer Daughter 60   Diabetes Brother    Hypertension Brother    Breast cancer Neg Hx     Social History   Tobacco Use   Smoking status: Never   Smokeless tobacco: Never  Substance Use Topics   Alcohol use: Not Currently    Comment: Due to AF     Current Outpatient Medications:    acetaminophen (TYLENOL) 500 MG tablet, Take 2 tablets (1,000 mg total) by mouth every 8 (eight) hours. OTC, Disp: , Rfl: 0   Cholecalciferol (PA VITAMIN D-3 GUMMY PO), Takes 2 gummies daily, Disp: , Rfl:    diltiazem (CARDIZEM) 30 MG tablet, Take 1 tablet (30  mg total) by mouth 4 (four) times daily as needed (atrial fibrillation or elevated heart rates)., Disp: 30 tablet, Rfl: 2   DULoxetine (CYMBALTA) 20 MG capsule, TAKE 2 CAPSULES BY MOUTH EVERY DAY, Disp: 180 capsule, Rfl: 3   ELDERBERRY PO, Take by mouth., Disp: , Rfl:    ELIQUIS 5 MG TABS tablet, TAKE ONE (1) TABLET BY MOUTH TWO TIMES PER DAY, Disp: 180 tablet, Rfl: 3   FINACEA 15 % FOAM, Apply 1 application topically daily as needed (rosacea). , Disp: , Rfl:    Galcanezumab-gnlm (EMGALITY) 120 MG/ML SOAJ, INJECT INTO THE SKIN EVERY 30 DAYS AS DIRECTED, Disp: 1 mL, Rfl: 11   magnesium oxide (MAG-OX) 400 MG tablet, Take 400 mg by mouth every morning., Disp: , Rfl:    methocarbamol (ROBAXIN) 500 MG tablet, Take 1 tablet (500 mg total) by mouth every 6 (six) hours as needed for muscle spasms., Disp: 90 tablet, Rfl: 0   metoprolol succinate (TOPROL-XL) 25 MG 24 hr tablet, TAKE 1 TABLET BY MOUTH DAILY, Disp: 90 tablet, Rfl: 2   omeprazole (PRILOSEC) 20 MG capsule, Take 1 capsule (20 mg total) by mouth daily., Disp: 90 capsule, Rfl: 3   Probiotic Product (PROBIOTIC PO), Take 1 capsule by mouth daily. , Disp: , Rfl:    rizatriptan (MAXALT-MLT) 10 MG disintegrating tablet, DISSOLVE 1 TABLET UNDER THE TONGUE AS NEEDED FOR MIGRAINE. MAY REPEAT IN 2 HOURS IF NEEDED., Disp: 27  tablet, Rfl: 4   trolamine salicylate (ASPERCREME) 10 % cream, Apply 1 application topically as needed., Disp: , Rfl:    Ubrogepant (UBRELVY) 100 MG TABS, Take 1 tablet (100 mg total) by mouth every 2 (two) hours as needed. Maximum 200mg  a day., Disp: 16 tablet, Rfl: 11   VITAMIN D PO, Take by mouth. gummy, Disp: , Rfl:   Allergies  Allergen Reactions   Adhesive [Tape] Dermatitis    SKIN TURNS RED-PAPER TAPE OK TO USE   Claritin [Loratadine] Swelling   Macrodantin [Nitrofurantoin] Nausea And Vomiting   Metronidazole Nausea Only   Nitrofurantoin Macrocrystal Nausea And Vomiting   Sulfamethoxazole-Trimethoprim Nausea Only   Cephalexin Rash   Clindamycin Hcl Rash   Doxycycline Swelling and Rash   Erythromycin Rash    I personally reviewed active problem list, medication list, allergies, family history, social history, health maintenance with the patient/caregiver today.   ROS  ***  Objective  There were no vitals filed for this visit.  There is no height or weight on file to calculate BMI.  Physical Exam ***  No results found for this or any previous visit (from the past 2160 hour(s)).   PHQ2/9:    09/10/2021    9:12 AM 06/23/2021   11:30 AM 05/13/2021   11:14 AM 03/13/2021    7:57 AM 09/10/2020    8:22 AM  Depression screen PHQ 2/9  Decreased Interest 0 0 0 0 0  Down, Depressed, Hopeless 0 0 0 1 0  PHQ - 2 Score 0 0 0 1 0  Altered sleeping 0   0 0  Tired, decreased energy 0   0 0  Change in appetite 0   0 0  Feeling bad or failure about yourself  0   0 0  Trouble concentrating 0   0 0  Moving slowly or fidgety/restless 0   0 0  Suicidal thoughts 0   0 0  PHQ-9 Score 0   1 0    phq 9 is {gen pos [id number]}   Fall  Risk:    09/10/2021    9:12 AM 06/23/2021   11:32 AM 05/13/2021   11:15 AM 03/13/2021    7:57 AM 09/10/2020    8:22 AM  Fall Risk   Falls in the past year? 0 0 0 1 0  Number falls in past yr: 0 0 0 0 0  Injury with Fall? 0 0  0 0  Risk for fall  due to : No Fall Risks No Fall Risks  No Fall Risks   Follow up Falls prevention discussed Falls prevention discussed  Falls prevention discussed       Functional Status Survey:      Assessment & Plan  *** There are no diagnoses linked to this encounter.

## 2022-09-13 ENCOUNTER — Ambulatory Visit (INDEPENDENT_AMBULATORY_CARE_PROVIDER_SITE_OTHER): Payer: Medicare Other | Admitting: Family Medicine

## 2022-09-13 ENCOUNTER — Encounter: Payer: Self-pay | Admitting: Family Medicine

## 2022-09-13 VITALS — BP 118/70 | HR 74 | Resp 16 | Ht 66.0 in | Wt 228.0 lb

## 2022-09-13 DIAGNOSIS — I480 Paroxysmal atrial fibrillation: Secondary | ICD-10-CM | POA: Diagnosis not present

## 2022-09-13 DIAGNOSIS — M797 Fibromyalgia: Secondary | ICD-10-CM | POA: Diagnosis not present

## 2022-09-13 DIAGNOSIS — Z79899 Other long term (current) drug therapy: Secondary | ICD-10-CM | POA: Diagnosis not present

## 2022-09-13 DIAGNOSIS — E559 Vitamin D deficiency, unspecified: Secondary | ICD-10-CM

## 2022-09-13 DIAGNOSIS — E785 Hyperlipidemia, unspecified: Secondary | ICD-10-CM | POA: Diagnosis not present

## 2022-09-13 DIAGNOSIS — R7982 Elevated C-reactive protein (CRP): Secondary | ICD-10-CM

## 2022-09-13 DIAGNOSIS — G4733 Obstructive sleep apnea (adult) (pediatric): Secondary | ICD-10-CM | POA: Diagnosis not present

## 2022-09-13 DIAGNOSIS — R739 Hyperglycemia, unspecified: Secondary | ICD-10-CM

## 2022-09-13 DIAGNOSIS — E538 Deficiency of other specified B group vitamins: Secondary | ICD-10-CM | POA: Diagnosis not present

## 2022-09-13 DIAGNOSIS — F325 Major depressive disorder, single episode, in full remission: Secondary | ICD-10-CM | POA: Diagnosis not present

## 2022-09-13 DIAGNOSIS — K219 Gastro-esophageal reflux disease without esophagitis: Secondary | ICD-10-CM

## 2022-09-13 LAB — CBC WITH DIFFERENTIAL/PLATELET
Absolute Monocytes: 410 cells/uL (ref 200–950)
Basophils Absolute: 50 cells/uL (ref 0–200)
Basophils Relative: 0.8 %
Eosinophils Absolute: 88 cells/uL (ref 15–500)
Eosinophils Relative: 1.4 %
HCT: 41.4 % (ref 35.0–45.0)
Hemoglobin: 13.7 g/dL (ref 11.7–15.5)
Lymphs Abs: 1695 cells/uL (ref 850–3900)
MCH: 30.4 pg (ref 27.0–33.0)
MCHC: 33.1 g/dL (ref 32.0–36.0)
MCV: 92 fL (ref 80.0–100.0)
MPV: 10.1 fL (ref 7.5–12.5)
Monocytes Relative: 6.5 %
Neutro Abs: 4057 cells/uL (ref 1500–7800)
Neutrophils Relative %: 64.4 %
Platelets: 375 10*3/uL (ref 140–400)
RBC: 4.5 10*6/uL (ref 3.80–5.10)
RDW: 13.1 % (ref 11.0–15.0)
Total Lymphocyte: 26.9 %
WBC: 6.3 10*3/uL (ref 3.8–10.8)

## 2022-09-13 LAB — SEDIMENTATION RATE: Sed Rate: 25 mm/h (ref 0–30)

## 2022-09-13 MED ORDER — OMEPRAZOLE 20 MG PO CPDR: 20.0000 mg | DELAYED_RELEASE_CAPSULE | Freq: Every day | ORAL | 3 refills | Status: DC

## 2022-09-13 MED ORDER — METHOCARBAMOL 500 MG PO TABS: 500.0000 mg | ORAL_TABLET | Freq: Four times a day (QID) | ORAL | 0 refills | Status: DC | PRN

## 2022-09-30 ENCOUNTER — Ambulatory Visit (INDEPENDENT_AMBULATORY_CARE_PROVIDER_SITE_OTHER): Payer: Medicare Other

## 2022-09-30 VITALS — Ht 66.0 in | Wt 228.0 lb

## 2022-09-30 DIAGNOSIS — Z0000 Encounter for general adult medical examination without abnormal findings: Secondary | ICD-10-CM

## 2022-09-30 DIAGNOSIS — Z1231 Encounter for screening mammogram for malignant neoplasm of breast: Secondary | ICD-10-CM

## 2022-09-30 NOTE — Patient Instructions (Signed)
Sonya Lopez , Thank you for taking time to come for your Medicare Wellness Visit. I appreciate your ongoing commitment to your health goals. Please review the following plan we discussed and let me know if I can assist you in the future.   Referrals/Orders/Follow-Ups/Clinician Recommendations: none  This is a list of the screening recommended for you and due dates:  Health Maintenance  Topic Date Due   COVID-19 Vaccine (3 - Pfizer risk series) 05/17/2019   Flu Shot  09/02/2022   Mammogram  12/23/2022   Medicare Annual Wellness Visit  09/30/2023   DTaP/Tdap/Td vaccine (3 - Td or Tdap) 02/11/2027   Colon Cancer Screening  08/12/2027   Pneumonia Vaccine  Completed   DEXA scan (bone density measurement)  Completed   Hepatitis C Screening  Completed   Zoster (Shingles) Vaccine  Completed   HPV Vaccine  Aged Out   Cologuard (Stool DNA test)  Discontinued    Advanced directives: (In Chart) A copy of your advanced directives are scanned into your chart should your provider ever need it.  Next Medicare Annual Wellness Visit scheduled for next year: Yes 10/08/23 @ 11:45am telephone

## 2022-09-30 NOTE — Progress Notes (Signed)
Subjective:   Sonya Lopez is a 67 y.o. female who presents for an Initial Medicare Annual Wellness Visit.  Visit Complete: Virtual  I connected with  Sonya Lopez on 09/30/22 by a audio enabled telemedicine application and verified that I am speaking with the correct person using two identifiers.  Patient Location: Home  Provider Location: Office/Clinic  I discussed the limitations of evaluation and management by telemedicine. The patient expressed understanding and agreed to proceed.  Vital Signs: Unable to obtain new vitals due to this being a telehealth visit.  Patient Medicare AWV questionnaire was completed by the patient on (not done); I have confirmed that all information answered by patient is correct and no changes since this date.  Review of Systems    Cardiac Risk Factors include: advanced age (>75men, >108 women);diabetes mellitus;dyslipidemia;obesity (BMI >30kg/m2)    Objective:    Today's Vitals   09/30/22 0816  Weight: 228 lb (103.4 kg)  Height: 5\' 6"  (1.676 m)  PainSc: 2    Body mass index is 36.8 kg/m.     09/30/2022    8:35 AM 06/23/2021   11:31 AM 01/31/2021    1:00 PM 01/31/2021    6:47 AM 04/24/2020    9:06 AM 12/24/2019    8:49 AM 10/17/2019   10:08 AM  Advanced Directives  Does Patient Have a Medical Advance Directive? Yes Yes Yes Yes Yes No Yes  Type of Estate agent of [place];Living will Healthcare Power of [place];Living will Healthcare Power of [place];Living will Healthcare Power of [place];Living will Healthcare Power of [place];Living will    Does patient want to make changes to medical advance directive?   No - Patient declined No - Patient declined     Copy of Healthcare Power of Attorney in Chart?  Yes - validated most recent copy scanned in chart (See row information) No - copy requested No - copy requested Yes - validated most recent copy scanned in chart (See row information)    Would patient like  information on creating a medical advance directive?      No - Patient declined     Current Medications (verified) Outpatient Encounter Medications as of 09/30/2022  Medication Sig   acetaminophen (TYLENOL) 500 MG tablet Take 2 tablets (1,000 mg total) by mouth every 8 (eight) hours. OTC   Cholecalciferol (VITAMIN D3 GUMMIES) 25 MCG (1000 UT) CHEW Chew 2 each by mouth daily at 12 noon.   Cyanocobalamin (B-12-SL) 1000 MCG SUBL Place 1 each under the tongue daily at 12 noon.   diltiazem (CARDIZEM) 30 MG tablet Take 1 tablet (30 mg total) by mouth 4 (four) times daily as needed (atrial fibrillation or elevated heart rates).   DULoxetine (CYMBALTA) 20 MG capsule TAKE 2 CAPSULES BY MOUTH EVERY DAY   ELDERBERRY PO Take by mouth.   ELIQUIS 5 MG TABS tablet TAKE ONE (1) TABLET BY MOUTH TWO TIMES PER DAY   FINACEA 15 % FOAM Apply 1 application topically daily as needed (rosacea).    Galcanezumab-gnlm (EMGALITY) 120 MG/ML SOAJ INJECT INTO THE SKIN EVERY 30 DAYS AS DIRECTED   magnesium oxide (MAG-OX) 400 MG tablet Take 400 mg by mouth every morning.   methocarbamol (ROBAXIN) 500 MG tablet Take 1 tablet (500 mg total) by mouth every 6 (six) hours as needed for muscle spasms.   metoprolol succinate (TOPROL-XL) 25 MG 24 hr tablet TAKE 1 TABLET BY MOUTH DAILY   omeprazole (PRILOSEC) 20 MG capsule Take 1 capsule (20  mg total) by mouth daily.   Probiotic Product (PROBIOTIC PO) Take 1 capsule by mouth daily.    rizatriptan (MAXALT-MLT) 10 MG disintegrating tablet DISSOLVE 1 TABLET UNDER THE TONGUE AS NEEDED FOR MIGRAINE. MAY REPEAT IN 2 HOURS IF NEEDED.   trolamine salicylate (ASPERCREME) 10 % cream Apply 1 application topically as needed.   Ubrogepant (UBRELVY) 100 MG TABS Take 1 tablet (100 mg total) by mouth every 2 (two) hours as needed. Maximum 200mg  a day.   No facility-administered encounter medications on file as of 09/30/2022.    Allergies (verified) Adhesive [tape], Claritin [loratadine],  Macrodantin [nitrofurantoin], Metronidazole, Nitrofurantoin macrocrystal, Sulfamethoxazole-trimethoprim, Cephalexin, Clindamycin hcl, Doxycycline, and Erythromycin   History: Past Medical History:  Diagnosis Date   Actinic keratosis    Allergy    Anxiety    situational anxiety   Arrhythmia    A-fib/A-flutter   Atrial flutter (HCC)    Breast neoplasm, Tis (DCIS), 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  MammoSite   Calculus of kidney    Cancer (HCC) 2017   Left- DCIS   Depression    Diet-controlled type 2 diabetes mellitus (HCC)    Difficult intubation 2009   [facility]- anes had to use  'boogee" DURING GALLBLADDER SURGERY ONLY-NO PROBLEMS SINCE   Female cystocele 01/05/2011   Fibromyalgia    Gallbladder problem    GERD (gastroesophageal reflux disease)    Headache(784.0)    MIGRAINES   History of kidney stones    IBS (irritable bowel syndrome)    Joint pain    Nocturnal hypoxemia 05/09/2022   Obesity    OSA (obstructive sleep apnea) 03/27/2019   USE CPAP   OSA (obstructive sleep apnea)    Personal history of radiation therapy 2017   LEFT lumpectomy   PONV (postoperative nausea and vomiting)    h/o difficult intubation and anes had to use "boogee"   Sleep apnea 2020   Vitamin D deficiency    Vitreous detachment of right eye    sees floaters   Past Surgical History:  Procedure Laterality Date   ABDOMINAL HYSTERECTOMY  2013   ANTERIOR AND POSTERIOR REPAIR  11/30/2011   Procedure: ANTERIOR (CYSTOCELE) AND POSTERIOR REPAIR (RECTOCELE);  Surgeon: Martina Sinner, MD;  Location: [facility];  Service: Urology;  Laterality: N/A;  with cysto   ANTERIOR CERVICAL DECOMP/DISCECTOMY FUSION N/A 10/24/2019   Procedure: ANTERIOR CERVICAL DECOMPRESSION/DISCECTOMY FUSION 2 LEVELS C5-7;  Surgeon: Venetia Night, MD;  Location: [facility];  Service: Neurosurgery;  Laterality: N/A;   ATRIAL FIBRILLATION ABLATION N/A 03/01/2019    Procedure: ATRIAL FIBRILLATION ABLATION;  Surgeon: Regan Lemming, MD;  Location: [facility];  Service: Cardiovascular;  Laterality: N/A;   BLADDER SUSPENSION  11/30/2011   Procedure: SPARC PROCEDURE;  Surgeon: Martina Sinner, MD;  Location: [facility];  Service: Urology;  Laterality: N/A;  graft 10x6   BREAST BIOPSY Right 2007   negative core   BREAST BIOPSY Left 09/16/2015   DCIS   BREAST LUMPECTOMY Left 09/30/2015   DCIS clear margins and rad tx/HIGH GRADE DUCTAL CARCINOMA IN SITU, COMEDO TYPE   BREAST LUMPECTOMY WITH NEEDLE LOCALIZATION Left 09/30/2015   DCIS excision, MammoSite radiation;  Surgeon: Earline Mayotte, MD;  Location: [facility];  Service: General;  Laterality: Left;   CATARACT EXTRACTION Left 01/2019   CESAREAN SECTION     x 1   CHOLECYSTECTOMY  2005   COLONOSCOPY  2009   CYSTOSCOPY  11/30/2011   Procedure: CYSTOSCOPY;  Surgeon: Martina Sinner, MD;  Location: [facility];  Service: Urology;;   DILATION AND CURETTAGE OF UTERUS  05/05/2007   GALLBLADDER SURGERY  2004   SPINE SURGERY  2022   TONSILLECTOMY  1961   VAGINAL HYSTERECTOMY  11/30/2011   Procedure: HYSTERECTOMY VAGINAL;  Surgeon: Allie Bossier, MD;  Location: [facility];  Service: Gynecology;  Laterality: N/A;   Family History  Problem Relation Age of Onset   Diabetes Mother    Hearing loss Mother    Hypertension Mother    Stroke Mother    Kidney disease Mother    Sleep apnea Mother    Obesity Mother    Heart disease Father    Heart failure Father    Stroke Father    Depression Father    ADD / ADHD Father    Arthritis Father    Heart disease Paternal Grandfather    Asthma Daughter    ADD / ADHD Daughter    Asthma Daughter    Migraines Daughter    Fibromyalgia Daughter    Interstitial cystitis Daughter    Asthma Daughter    Colitis Daughter    Thyroid cancer Daughter 3   Diabetes Brother    Hypertension Brother    Breast cancer Neg Hx    Social History   Socioeconomic History    Marital status: Married    Spouse name: Theatre manager    Number of children: 3   Years of education: 16   Highest education level: Bachelor's degree (e.g., BA, AB, BS)  Occupational History   Occupation: Diplomatic Services operational officer  Tobacco Use   Smoking status: Never   Smokeless tobacco: Never  Vaping Use   Vaping status: Never Used  Substance and Sexual Activity   Alcohol use: Not Currently    Comment: Due to AF   Drug use: No   Sexual activity: Yes    Partners: Male    Birth control/protection: Surgical  Other Topics Concern   Not on file  Social History Narrative   Lives at home with husband    Right handed   Caffeine: about 1 cup daily   Social Determinants of Health   Financial Resource Strain: Low Risk  (09/30/2022)   Overall Financial Resource Strain (CARDIA)    Difficulty of Paying Living Expenses: Not hard at all  Food Insecurity: No Food Insecurity (09/30/2022)   Hunger Vital Sign    Worried About Running Out of Food in the Last Year: Never true    Ran Out of Food in the Last Year: Never true  Transportation Needs: No Transportation Needs (09/30/2022)   PRAPARE - Administrator, Civil Service (Medical): No    Lack of Transportation (Non-Medical): No  Physical Activity: Sufficiently Active (09/30/2022)   Exercise Vital Sign    Days of Exercise per Week: 6 days    Minutes of Exercise per Session: 60 min  Stress: No Stress Concern Present (09/30/2022)   [employer] of Occupational Health - Occupational Stress Questionnaire    Feeling of Stress : Not at all  Social Connections: Socially Integrated (09/30/2022)   Social Connection and Isolation Panel [NHANES]    Frequency of Communication with Friends and Family: More than three times a week    Frequency of Social Gatherings with Friends and Family: More than three times a week    Attends Religious Services: More than 4 times per year    Active Member of [employer] or Organizations: Yes  Attends Hospital doctor: More than 4 times per year    Marital Status: Married    Tobacco Counseling Counseling given: Not Answered   Clinical Intake:  Pre-visit preparation completed: Yes  Pain : 0-10 Pain Score: 2  Pain Location: Generalized Pain Descriptors / Indicators: Aching Pain Onset: More than a month ago Pain Frequency: Constant Pain Relieving Factors: Tylenol  Pain Relieving Factors: Tylenol  BMI - recorded: 36.8 Nutritional Status: BMI > 30  Obese Nutritional Risks: None Diabetes: Yes (pt says no) CBG done?: No Did pt. bring in CBG monitor from home?: No  How often do you need to have someone help you when you read instructions, pamphlets, or other written materials from your doctor or pharmacy?: 1 - Never  Interpreter Needed?: No  Comments: lives with husband Information entered by :: B.Melina Mosteller,LPN   Activities of Daily Living    09/30/2022    8:31 AM 09/13/2022   10:05 AM  In your present state of health, do you have any difficulty performing the following activities:  Hearing? 0 0  Vision? 0 0  Difficulty concentrating or making decisions? 0 0  Walking or climbing stairs? 0 0  Dressing or bathing? 0 0  Doing errands, shopping? 0 0  Preparing Food and eating ? N   Using the Toilet? N   In the past six months, have you accidently leaked urine? N   Do you have problems with loss of bowel control? N   Managing your Medications? N   Managing your Finances? N   Housekeeping or managing your Housekeeping? N     Patient Care Team: [name], MD as PCP - General Elberta Fortis, Andree Coss, MD as PCP - Electrophysiology (Cardiology) Antonieta Iba, MD as Consulting Physician (Cardiology) Anson Fret, MD as Consulting Physician (Neurology) Willeen Niece, MD (Dermatology) Regan Lemming, MD as Consulting Physician (Cardiology)  Indicate any recent Medical Services you may have received from other than [facility] providers in the past year (date may be  approximate).     Assessment:   This is a routine wellness examination for Sonya Lopez.  Hearing/Vision screen Hearing Screening - Comments:: Adequate hearing Vision Screening - Comments:: Adequate vision just readers [facility]  Dietary issues and exercise activities discussed:     Goals Addressed             This Visit's Progress    Patient Stated       Pt keep walking and caring her yourself       Depression Screen    09/30/2022    8:25 AM 09/13/2022   10:04 AM 09/10/2021    9:12 AM 06/23/2021   11:30 AM 05/13/2021   11:14 AM 03/13/2021    7:57 AM 09/10/2020    8:22 AM  PHQ 2/9 Scores  PHQ - 2 Score 0 0 0 0 0 1 0  PHQ- 9 Score  0 0   1 0    Fall Risk    09/30/2022    8:19 AM 09/13/2022   10:04 AM 09/10/2021    9:12 AM 06/23/2021   11:32 AM 05/13/2021   11:15 AM  Fall Risk   Falls in the past year? 0 0 0 0 0  Number falls in past yr: 0 0 0 0 0  Injury with Fall? 0 0 0 0   Risk for fall due to : No Fall Risks No Fall Risks No Fall Risks No Fall Risks   Follow up Education  provided;Falls prevention discussed Falls prevention discussed Falls prevention discussed Falls prevention discussed     MEDICARE RISK AT HOME: Medicare Risk at Home Any stairs in or around the home?: Yes (outside) If so, are there any without handrails?: Yes Home free of loose throw rugs in walkways, pet beds, electrical cords, etc?: Yes Adequate lighting in your home to reduce risk of falls?: Yes Life alert?: No Use of a cane, walker or w/c?: No Grab bars in the bathroom?: Yes Shower chair or bench in shower?: No Elevated toilet seat or a handicapped toilet?: Yes  TIMED UP AND GO:  Was the test performed? No    Cognitive Function:        09/30/2022    8:32 AM 04/20/2019    8:26 AM  6CIT Screen  What Year? 0 points 0 points  What month? 0 points 0 points  What time? 0 points 0 points  Count back from 20 0 points 0 points  Months in reverse 0 points 0 points  Repeat phrase 0  points 0 points  Total Score 0 points 0 points    Immunizations Immunization History  Administered Date(s) Administered   Fluad Quad(high Dose 65+) 10/02/2018, 10/22/2020, 11/18/2021   Influenza Split 10/25/2006, 10/07/2011   Influenza, Quadrivalent, Recombinant, Inj, Pf 11/11/2017   Influenza, Seasonal, Injecte, Preservative Fre 10/09/2010   Influenza-Unspecified 10/23/2013, 11/05/2015, 11/10/2016, 12/10/2019   PFIZER(Purple Top)SARS-COV-2 Vaccination 03/29/2019, 04/19/2019   Pneumococcal Conjugate-13 09/24/2015   Pneumococcal Polysaccharide-23 04/17/2011, 09/01/2018   Tdap 10/25/2006, 02/10/2017   Zoster Recombinant(Shingrix) 10/01/2017, 02/13/2018   Zoster, Live 11/16/2010    TDAP status: Up to date  Flu Vaccine status: Up to date  Pneumococcal vaccine status: Up to date  Covid-19 vaccine status: Completed vaccines  Qualifies for Shingles Vaccine? Yes   Zostavax completed Yes   Shingrix Completed?: Yes  Screening Tests Health Maintenance  Topic Date Due   COVID-19 Vaccine (3 - Pfizer risk series) 05/17/2019   INFLUENZA VACCINE  09/02/2022   MAMMOGRAM  12/23/2022   Medicare Annual Wellness (AWV)  09/30/2023   DTaP/Tdap/Td (3 - Td or Tdap) 02/11/2027   Colonoscopy  08/12/2027   Pneumonia Vaccine 52+ Years old  Completed   DEXA SCAN  Completed   Hepatitis C Screening  Completed   Zoster Vaccines- Shingrix  Completed   HPV VACCINES  Aged Out   Fecal DNA (Cologuard)  Discontinued    Health Maintenance  Health Maintenance Due  Topic Date Due   COVID-19 Vaccine (3 - Pfizer risk series) 05/17/2019   INFLUENZA VACCINE  09/02/2022    Colorectal cancer screening: Type of screening: Colonoscopy. Completed yes. Repeat every 10 years  Mammogram status: Ordered yes. Pt provided with contact info and advised to call to schedule appt.   Bone Density status: Completed yes. Results reflect: Bone density results: NORMAL. Repeat every 5 years.  Lung Cancer Screening: (Low  Dose CT Chest recommended if Age 67-80 years, 20 pack-year currently smoking OR have quit w/in 15years.) does not qualify.   Lung Cancer Screening Referral: no  Additional Screening:  Hepatitis C Screening: does not qualify; Completed yes  Vision Screening: Recommended annual ophthalmology exams for early detection of glaucoma and other disorders of the eye. Is the patient up to date with their annual eye exam?  Yes  Who is the provider or what is the name of the office in which the patient attends annual eye exams? [facility] If pt is not established with a provider, would they like to  be referred to a provider to establish care? No .   Dental Screening: Recommended annual dental exams for proper oral hygiene  Diabetic Foot Exam: Diabetic Foot Exam: Overdue, Pt has been advised about the importance in completing this exam. Pt is scheduled for diabetic foot exam on next visit with PCP.  Community Resource Referral / Chronic Care Management: CRR required this visit?  No   CCM required this visit?  No     Plan:     I have personally reviewed and noted the following in the patient's chart:   Medical and social history Use of alcohol, tobacco or illicit drugs  Current medications and supplements including opioid prescriptions. Patient is not currently taking opioid prescriptions. Functional ability and status Nutritional status Physical activity Advanced directives List of other physicians Hospitalizations, surgeries, and ER visits in previous 12 months Vitals Screenings to include cognitive, depression, and falls Referrals and appointments  In addition, I have reviewed and discussed with patient certain preventive protocols, quality metrics, and best practice recommendations. A written personalized care plan for preventive services as well as general preventive health recommendations were provided to patient.     Sue Lush, LPN   1/61/0960   After Visit Summary:  (MyChart) Due to this being a telephonic visit, the after visit summary with patients personalized plan was offered to patient via MyChart   Nurse Notes: The patient states she is doing well and has no concerns or questions at this time.

## 2022-11-18 ENCOUNTER — Other Ambulatory Visit: Payer: Self-pay | Admitting: General Surgery

## 2022-11-18 DIAGNOSIS — Z1231 Encounter for screening mammogram for malignant neoplasm of breast: Secondary | ICD-10-CM

## 2022-12-08 ENCOUNTER — Other Ambulatory Visit: Payer: Self-pay | Admitting: Family Medicine

## 2022-12-15 ENCOUNTER — Other Ambulatory Visit: Payer: Self-pay

## 2022-12-15 ENCOUNTER — Encounter: Payer: Self-pay | Admitting: Cardiology

## 2022-12-15 DIAGNOSIS — I4892 Unspecified atrial flutter: Secondary | ICD-10-CM

## 2022-12-15 MED ORDER — DILTIAZEM HCL 30 MG PO TABS: 30.0000 mg | ORAL_TABLET | Freq: Four times a day (QID) | ORAL | 0 refills | Status: AC | PRN

## 2022-12-15 MED ORDER — APIXABAN 5 MG PO TABS: 5.0000 mg | ORAL_TABLET | Freq: Two times a day (BID) | ORAL | 0 refills | Status: DC

## 2022-12-15 MED ORDER — METOPROLOL SUCCINATE ER 25 MG PO TB24: ORAL_TABLET | ORAL | 0 refills | Status: DC

## 2022-12-20 ENCOUNTER — Encounter: Payer: Self-pay | Admitting: Adult Health

## 2022-12-20 ENCOUNTER — Ambulatory Visit (INDEPENDENT_AMBULATORY_CARE_PROVIDER_SITE_OTHER): Payer: Medicare Other | Admitting: Adult Health

## 2022-12-20 VITALS — BP 132/61 | HR 70 | Ht 66.0 in | Wt 227.0 lb

## 2022-12-20 DIAGNOSIS — G4733 Obstructive sleep apnea (adult) (pediatric): Secondary | ICD-10-CM

## 2022-12-20 NOTE — Progress Notes (Signed)
[facility] [address] [place]. [place] [address] [phone]       OFFICE FOLLOW UP NOTE  [name] Date of Birth:  November 07, 1955 Medical Record Number:  [id number]    Reason for visit: CPAP follow-up    SUBJECTIVE:   CHIEF COMPLAINT:  Chief Complaint  Patient presents with   Follow-up    Rm 3, here alone Pt is here for CPAP follow up. No concerns.     Follow-up visit:  Prior visit: 07/20/2022 Dr. Vickey Lopez  Brief HPI:   Sonya Lopez is a 67 y.o. female who was initially evaluated by Dr. Vickey Lopez in 2020 and completed sleep study in 07/2018 with total AHI of 5.4/h but did show bradycardia and hypoxia in all apnea strongly dependent and supine sleep position.  She was referred to a pulmonologist who repeated a sleep study and was instructed to sleep on her back, HST qualified for CPAP therapy with mild OSA of AHI 11/h and initiated CPAP therapy through pulmonology but had difficulty tolerating interface despite trying several different masks,did not have f/u with pulmonology. She was referred to dentistry Dr. Toni Lopez for oral appliance.  She is also followed by Dr. Lucia Lopez for migraine management.  At prior visit with Dr. Vickey Lopez, noted improvement of apneas with both CPAP and oral appliance as well as headaches which were not well treated with either therapy on their own.  Did not bring CPAP machine to obtain download.  Noted awaiting Dr. Alveda Lopez planned HST on CPAP and dental device   Interval history:  CPAP compliance report shows 30 out of 30 usage days with 30 days greater than 4 hours for 100% compliance.  Average usage 6 hours and 52 minutes.  Residual AHI 0.7 on set pressure of 13.  Leaks in the 95th percentile 54.  She is bothered by the frequent leak's, typically coming from the bottom and top of her mask.  Can wake her up in the middle of the night.  Top of her mask also causes irritation over the bridge of her nose, she has  tried multiple different protective measures to help with this but without any benefit. Continued use of oral appliance as well, routinely follows with Dr. Toni Lopez, has repeat HST back in July (unable to view via epic). Headaches remain well controlled, did wake up with 1 headache over the past month but no other recent headaches.      ROS:   14 system review of systems performed and negative with exception of those listed in HPI  PMH:  Past Medical History:  Diagnosis Date   Actinic keratosis    Allergy    Anxiety    situational anxiety   Arrhythmia    A-fib/A-flutter   Atrial flutter (HCC)    Breast neoplasm, Tis (DCIS), 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  MammoSite   Calculus of kidney    Cancer (HCC) 2017   Left- DCIS   Depression    Diet-controlled type 2 diabetes mellitus (HCC)    Difficult intubation 2009   [facility]- anes had to use  'boogee" DURING GALLBLADDER SURGERY ONLY-NO PROBLEMS SINCE   Female cystocele 01/05/2011   Fibromyalgia    Gallbladder problem    GERD (gastroesophageal reflux disease)    Headache(784.0)    MIGRAINES   History of kidney stones    IBS (irritable bowel syndrome)    Joint pain    Nocturnal hypoxemia 05/09/2022  Obesity    OSA (obstructive sleep apnea) 03/27/2019   USE CPAP   OSA (obstructive sleep apnea)    Personal history of radiation therapy 2017   LEFT lumpectomy   PONV (postoperative nausea and vomiting)    h/o difficult intubation and anes had to use "boogee"   Sleep apnea 2020   Vitamin D deficiency    Vitreous detachment of right eye    sees floaters    PSH:  Past Surgical History:  Procedure Laterality Date   ABDOMINAL HYSTERECTOMY  2013   ANTERIOR AND POSTERIOR REPAIR  11/30/2011   Procedure: ANTERIOR (CYSTOCELE) AND POSTERIOR REPAIR (RECTOCELE);  Surgeon: Martina Sinner, MD;  Location: [facility];  Service: Urology;  Laterality: N/A;  with cysto   ANTERIOR  CERVICAL DECOMP/DISCECTOMY FUSION N/A 10/24/2019   Procedure: ANTERIOR CERVICAL DECOMPRESSION/DISCECTOMY FUSION 2 LEVELS C5-7;  Surgeon: Venetia Night, MD;  Location: [facility];  Service: Neurosurgery;  Laterality: N/A;   ATRIAL FIBRILLATION ABLATION N/A 03/01/2019   Procedure: ATRIAL FIBRILLATION ABLATION;  Surgeon: Regan Lemming, MD;  Location: [facility];  Service: Cardiovascular;  Laterality: N/A;   BLADDER SUSPENSION  11/30/2011   Procedure: SPARC PROCEDURE;  Surgeon: Martina Sinner, MD;  Location: [facility];  Service: Urology;  Laterality: N/A;  graft 10x6   BREAST BIOPSY Right 2007   negative core   BREAST BIOPSY Left 09/16/2015   DCIS   BREAST LUMPECTOMY Left 09/30/2015   DCIS clear margins and rad tx/HIGH GRADE DUCTAL CARCINOMA IN SITU, COMEDO TYPE   BREAST LUMPECTOMY WITH NEEDLE LOCALIZATION Left 09/30/2015   DCIS excision, MammoSite radiation;  Surgeon: Earline Mayotte, MD;  Location: [facility];  Service: General;  Laterality: Left;   CATARACT EXTRACTION Left 01/2019   CESAREAN SECTION     x 1   CHOLECYSTECTOMY  2005   COLONOSCOPY  2009   CYSTOSCOPY  11/30/2011   Procedure: CYSTOSCOPY;  Surgeon: Martina Sinner, MD;  Location: [facility];  Service: Urology;;   DILATION AND CURETTAGE OF UTERUS  05/05/2007   GALLBLADDER SURGERY  2004   SPINE SURGERY  2022   TONSILLECTOMY  1961   VAGINAL HYSTERECTOMY  11/30/2011   Procedure: HYSTERECTOMY VAGINAL;  Surgeon: Allie Bossier, MD;  Location: [facility];  Service: Gynecology;  Laterality: N/A;    Social History:  Social History   Socioeconomic History   Marital status: Married    Spouse name: Sonya Lopez    Number of children: 3   Years of education: 16   Highest education level: Bachelor's degree (e.g., BA, AB, BS)  Occupational History   Occupation: Diplomatic Services operational officer  Tobacco Use   Smoking status: Never   Smokeless tobacco: Never  Vaping Use   Vaping status: Never Used  Substance and Sexual Activity   Alcohol use:  Not Currently    Comment: Due to AF   Drug use: No   Sexual activity: Yes    Partners: Male    Birth control/protection: Surgical  Other Topics Concern   Not on file  Social History Narrative   Lives at home with husband    Right handed   Caffeine: about 1 cup daily   Social Determinants of Health   Financial Resource Strain: Low Risk  (09/30/2022)   Overall Financial Resource Strain (CARDIA)    Difficulty of Paying Living Expenses: Not hard at all  Food Insecurity: No Food Insecurity (09/30/2022)   Hunger Vital Sign    Worried About Running Out of Food in the  Last Year: Never true    Ran Out of Food in the Last Year: Never true  Transportation Needs: No Transportation Needs (09/30/2022)   PRAPARE - Administrator, Civil Service (Medical): No    Lack of Transportation (Non-Medical): No  Physical Activity: Sufficiently Active (09/30/2022)   Exercise Vital Sign    Days of Exercise per Week: 6 days    Minutes of Exercise per Session: 60 min  Stress: No Stress Concern Present (09/30/2022)   [employer] of Occupational Health - Occupational Stress Questionnaire    Feeling of Stress : Not at all  Social Connections: Socially Integrated (09/30/2022)   Social Connection and Isolation Panel [NHANES]    Frequency of Communication with Friends and Family: More than three times a week    Frequency of Social Gatherings with Friends and Family: More than three times a week    Attends Religious Services: More than 4 times per year    Active Member of [employer] or Organizations: Yes    Attends Engineer, structural: More than 4 times per year    Marital Status: Married  Catering Lopez Violence: Not At Risk (09/30/2022)   Humiliation, Afraid, Rape, and Kick questionnaire    Fear of Current or Ex-Partner: No    Emotionally Abused: No    Physically Abused: No    Sexually Abused: No    Family History:  Family History  Problem Relation Age of Onset   Diabetes Mother     Hearing loss Mother    Hypertension Mother    Stroke Mother    Kidney disease Mother    Sleep apnea Mother    Obesity Mother    Heart disease Father    Heart failure Father    Stroke Father    Depression Father    ADD / ADHD Father    Arthritis Father    Heart disease Paternal Grandfather    Asthma Daughter    ADD / ADHD Daughter    Asthma Daughter    Migraines Daughter    Fibromyalgia Daughter    Interstitial cystitis Daughter    Asthma Daughter    Colitis Daughter    Thyroid cancer Daughter 81   Diabetes Brother    Hypertension Brother    Breast cancer Neg Hx     Medications:   Current Outpatient Medications on File Prior to Visit  Medication Sig Dispense Refill   acetaminophen (TYLENOL) 500 MG tablet Take 2 tablets (1,000 mg total) by mouth every 8 (eight) hours. OTC  0   apixaban (ELIQUIS) 5 MG TABS tablet Take 1 tablet (5 mg total) by mouth 2 (two) times daily. 180 tablet 0   Cholecalciferol (VITAMIN D3 GUMMIES) 25 MCG (1000 UT) CHEW Chew 2 each by mouth daily at 12 noon.     Cyanocobalamin (B-12-SL) 1000 MCG SUBL Place 1 each under the tongue daily at 12 noon.     diltiazem (CARDIZEM) 30 MG tablet Take 1 tablet (30 mg total) by mouth 4 (four) times daily as needed (atrial fibrillation or elevated heart rates). 30 tablet 0   DULoxetine (CYMBALTA) 20 MG capsule TAKE 2 CAPSULES BY MOUTH EVERY DAY 180 capsule 1   ELDERBERRY PO Take by mouth.     FINACEA 15 % FOAM Apply 1 application topically daily as needed (rosacea).      Galcanezumab-gnlm (EMGALITY) 120 MG/ML SOAJ INJECT INTO THE SKIN EVERY 30 DAYS AS DIRECTED 1 mL 11   magnesium oxide (MAG-OX) 400 MG  tablet Take 400 mg by mouth every morning.     methocarbamol (ROBAXIN) 500 MG tablet Take 1 tablet (500 mg total) by mouth every 6 (six) hours as needed for muscle spasms. 90 tablet 0   metoprolol succinate (TOPROL-XL) 25 MG 24 hr tablet TAKE 1 TABLET BY MOUTH DAILY 90 tablet 0   omeprazole (PRILOSEC) 20 MG capsule Take 1  capsule (20 mg total) by mouth daily. 90 capsule 3   Probiotic Product (PROBIOTIC PO) Take 1 capsule by mouth daily.      rizatriptan (MAXALT-MLT) 10 MG disintegrating tablet DISSOLVE 1 TABLET UNDER THE TONGUE AS NEEDED FOR MIGRAINE. MAY REPEAT IN 2 HOURS IF NEEDED. 27 tablet 4   trolamine salicylate (ASPERCREME) 10 % cream Apply 1 application topically as needed.     Ubrogepant (UBRELVY) 100 MG TABS Take 1 tablet (100 mg total) by mouth every 2 (two) hours as needed. Maximum 200mg  a day. 16 tablet 11   No current facility-administered medications on file prior to visit.    Allergies:   Allergies  Allergen Reactions   Adhesive [Tape] Dermatitis    SKIN TURNS RED-PAPER TAPE OK TO USE   Claritin [Loratadine] Swelling   Macrodantin [Nitrofurantoin] Nausea And Vomiting   Metronidazole Nausea Only   Nitrofurantoin Macrocrystal Nausea And Vomiting   Sulfamethoxazole-Trimethoprim Nausea Only   Cephalexin Rash   Clindamycin Hcl Rash   Doxycycline Swelling and Rash   Erythromycin Rash      OBJECTIVE:  Physical Exam  Vitals:   12/20/22 1300  BP: 132/61  Pulse: 70  Weight: 227 lb (103 kg)  Height: 5\' 6"  (1.676 m)   Body mass index is 36.64 kg/m. No results found.   General: well developed, well nourished, very pleasant elderly Caucasian female, seated, in no evident distress  Neurologic Exam Mental Status: Awake and fully alert. Oriented to place and time. Recent and remote memory intact. Attention span, concentration and fund of knowledge appropriate. Mood and affect appropriate.  Cranial Nerves: Pupils equal, briskly reactive to light. Extraocular movements full without nystagmus. Visual fields full to confrontation. Hearing intact. Facial sensation intact. Face, tongue, palate moves normally and symmetrically.  Motor: Normal bulk and tone. Normal strength in all tested extremity muscles Gait and Station: Arises from chair without difficulty. Stance is normal. Gait  demonstrates normal stride length and balance without use of AD. Tandem walk and heel toe without difficulty.  Reflexes: 1+ and symmetric. Toes downgoing.         ASSESSMENT/PLAN: Sonya Lopez is a 67 y.o. year old female    OSA : Current use of both CPAP and oral appliance with adequate control of sleep apnea.  Continue current pressure settings. Will place order to DME for change of interface due to high leaks with current FFM and bridge of nose skin irritation, consider use of Evora FFM.  Due for new CPAP 02/2024.  Discussed continued nightly usage with ensuring greater than 4 hours nightly for optimal benefit and per insurance purposes.  Continue to follow with DME company for any needed supplies or CPAP related concerns.  Continue to follow with Dr. Toni Lopez for oral appliance management.     Follow up in 1 year or call earlier if needed   CC:  PCP: [name], MD    I spent 25 minutes of face-to-face and non-face-to-face time with patient.  This included previsit chart review, lab review, study review, order entry, electronic health record documentation, patient education and discussion regarding above diagnoses  and treatment plan and answered all other questions to patient's satisfaction  Ihor Austin, [facility]  [facility] [address] [place], [place] [address]  Phone [phone] Fax [phone] Note: This document was prepared with digital dictation and possible smart phrase technology. Any transcriptional errors that result from this process are unintentional.

## 2022-12-20 NOTE — Patient Instructions (Addendum)
Your Plan:  Continue nightly use of CPAP and oral appliance for adequate sleep apnea management   You will be called by your DME [employer] to be evaluate for a different mask - this may help reduce leaks and stop the irritation on the bridge of your nose  Continue to follow with DME for any needed supplies or CPAP related concerns      Follow up in 1 year or call earlier if needed      Thank you for coming to see [name] at [facility]. I hope we have been able to provide you high quality care today.  You may receive a patient satisfaction survey over the next few weeks. We would appreciate your feedback and comments so that we may continue to improve ourselves and the health of our patients.

## 2022-12-23 ENCOUNTER — Telehealth: Payer: Self-pay

## 2022-12-27 ENCOUNTER — Ambulatory Visit
Admission: RE | Admit: 2022-12-27 | Discharge: 2022-12-27 | Disposition: A | Discharge status: Home / Self Care | Payer: Medicare Other | Source: Ambulatory Visit | Attending: General Surgery | Admitting: General Surgery

## 2022-12-27 ENCOUNTER — Other Ambulatory Visit: Payer: Self-pay | Admitting: Physician Assistant

## 2022-12-27 DIAGNOSIS — I484 Atypical atrial flutter: Secondary | ICD-10-CM

## 2022-12-27 DIAGNOSIS — Z1231 Encounter for screening mammogram for malignant neoplasm of breast: Secondary | ICD-10-CM | POA: Diagnosis not present

## 2022-12-27 NOTE — Telephone Encounter (Signed)
Eliquis 5mg  refill request received from [facility] pharmacy. Patient is 67 years old, weight-103kg, Crea-0.87 on 09/13/22, Diagnosis-Afib, and last seen by Dr. Elberta Fortis on 03/04/22. Dose is appropriate based on dosing criteria. Will send in refill to requested pharmacy.   Last refill sent to [facility] for 90 day supply

## 2022-12-29 ENCOUNTER — Encounter: Payer: Self-pay | Admitting: Adult Health

## 2023-01-14 DIAGNOSIS — D3131 Benign neoplasm of right choroid: Secondary | ICD-10-CM | POA: Diagnosis not present

## 2023-01-14 DIAGNOSIS — H524 Presbyopia: Secondary | ICD-10-CM | POA: Diagnosis not present

## 2023-01-14 DIAGNOSIS — H43811 Vitreous degeneration, right eye: Secondary | ICD-10-CM | POA: Diagnosis not present

## 2023-01-18 ENCOUNTER — Other Ambulatory Visit (HOSPITAL_COMMUNITY): Payer: Self-pay

## 2023-01-18 DIAGNOSIS — Z1231 Encounter for screening mammogram for malignant neoplasm of breast: Secondary | ICD-10-CM | POA: Diagnosis not present

## 2023-02-12 NOTE — Progress Notes (Signed)
 I will call you in a bit     Date:  02/14/2023   ID:  Sonya Lopez, DOB 05/01/1955, MRN [id number]  Patient Location:  [address] [place] [address]   Provider location:   [facility], [place] office  PCP:  Glenard Mire, MD  Cardiologist:  Perla CRIS Nicolas  Chief Complaint  Patient presents with   12 month follow up     Doing well.     History of Present Illness:    Sonya Lopez is a 68 y.o. female  past medical history of Diabetes  GERD hospital admission in 04/16/2011 for atrial flutter with RVR.  CT coronary calcium score zero 09/2015 CT coronary calcium score zero 04/2022 A-fib ablation January 2021 OSA on CPAP She presents for routine followup of her atrial fibrillation and flutter Ablation 02/2019  Last seen by myself in clinic May 2023 Seen by EP February 2024  CT calcium score March 2024 Score of 0  She reports that since her ablation she has done very well, denies significant breakthrough arrhythmia concerning for atrial fibrillation Has not required diltiazem  30 mg as needed dosing Stable on metoprolol  succinate 25 daily, Eliquis  5 twice daily Off amiodarone   Blood pressure well-controlled  EKG personally reviewed by myself on todays visit EKG Interpretation Date/Time:  Monday February 14 2023 14:34:59 EST Ventricular Rate:  60 PR Interval:  134 QRS Duration:  84 QT Interval:  408 QTC Calculation: 408 R Axis:   36  Text Interpretation: Normal sinus rhythm Normal ECG When compared with ECG of 31-Jan-2021 05:15, No significant change was found Confirmed by Perla Lye [phone]) on 02/14/2023 3:10:03 PM     Past Medical History:  Diagnosis Date   Actinic keratosis    Allergy    Anxiety    situational anxiety   Arrhythmia    A-fib/A-flutter   Atrial flutter (HCC)    Breast neoplasm, Tis (DCIS), left 10/2015   ER 50%, PR 11-50%.Unable to tolerate Tamoxifen  or Aromasin.  MammoSite   Calculus of kidney     Cancer (HCC) 2017   Left- DCIS   Depression    Diet-controlled type 2 diabetes mellitus (HCC)    Difficult intubation 2009   [facility]- anes had to use  'boogee DURING GALLBLADDER SURGERY ONLY-NO PROBLEMS SINCE   Female cystocele 01/05/2011   Fibromyalgia    Gallbladder problem    GERD (gastroesophageal reflux disease)    Headache(784.0)    MIGRAINES   History of kidney stones    IBS (irritable bowel syndrome)    Joint pain    Nocturnal hypoxemia 05/09/2022   Obesity    OSA (obstructive sleep apnea) 03/27/2019   USE CPAP   OSA (obstructive sleep apnea)    Personal history of radiation therapy 2017   LEFT lumpectomy   PONV (postoperative nausea and vomiting)    h/o difficult intubation and anes had to use boogee   Sleep apnea 2020   Vitamin D  deficiency    Vitreous detachment of right eye    sees floaters   Past Surgical History:  Procedure Laterality Date   ABDOMINAL HYSTERECTOMY  2013   ANTERIOR AND POSTERIOR REPAIR  11/30/2011   Procedure: ANTERIOR (CYSTOCELE) AND POSTERIOR REPAIR (RECTOCELE);  Surgeon: Glendia DELENA Elizabeth, MD;  Location: [facility];  Service: Urology;  Laterality: N/A;  with cysto   ANTERIOR CERVICAL DECOMP/DISCECTOMY FUSION N/A 10/24/2019   Procedure: ANTERIOR CERVICAL DECOMPRESSION/DISCECTOMY FUSION 2 LEVELS C5-7;  Surgeon: Clois Fret, MD;  Location: [facility] OR[facility];  Service: Neurosurgery;  Laterality: N/A;   ATRIAL FIBRILLATION ABLATION N/A 03/01/2019   Procedure: ATRIAL FIBRILLATION ABLATION;  Surgeon: Inocencio Soyla Lunger, MD;  Location: [facility];  Service: Cardiovascular;  Laterality: N/A;   BLADDER SUSPENSION  11/30/2011   Procedure: SPARC PROCEDURE;  Surgeon: Glendia DELENA Elizabeth, MD;  Location: [facility];  Service: Urology;  Laterality: N/A;  graft 10x6   BREAST BIOPSY Right 2007   negative core   BREAST BIOPSY Left 09/16/2015   DCIS   BREAST LUMPECTOMY Left 09/30/2015   DCIS clear margins and rad tx/HIGH GRADE DUCTAL  CARCINOMA IN SITU, COMEDO TYPE   BREAST LUMPECTOMY WITH NEEDLE LOCALIZATION Left 09/30/2015   DCIS excision, MammoSite radiation;  Surgeon: Reyes LELON Cota, MD;  Location: [facility] OR[facility];  Service: General;  Laterality: Left;   CATARACT EXTRACTION Left 01/2019   CESAREAN SECTION     x 1   CHOLECYSTECTOMY  2005   COLONOSCOPY  2009   CYSTOSCOPY  11/30/2011   Procedure: CYSTOSCOPY;  Surgeon: Glendia DELENA Elizabeth, MD;  Location: [facility];  Service: Urology;;   DILATION AND CURETTAGE OF UTERUS  05/05/2007   GALLBLADDER SURGERY  2004   SPINE SURGERY  2022   TONSILLECTOMY  1961   VAGINAL HYSTERECTOMY  11/30/2011   Procedure: HYSTERECTOMY VAGINAL;  Surgeon: Harland JAYSON Birkenhead, MD;  Location: [facility];  Service: Gynecology;  Laterality: N/A;     Current Meds  Medication Sig   acetaminophen  (TYLENOL ) 500 MG tablet Take 2 tablets (1,000 mg total) by mouth every 8 (eight) hours. OTC   Cholecalciferol  (VITAMIN D3 GUMMIES) 25 MCG (1000 UT) CHEW Chew 2 each by mouth daily at 12 noon.   Cyanocobalamin  (B-12-SL) 1000 MCG SUBL Place 1 each under the tongue daily at 12 noon.   diltiazem  (CARDIZEM ) 30 MG tablet Take 1 tablet (30 mg total) by mouth 4 (four) times daily as needed (atrial fibrillation or elevated heart rates).   DULoxetine  (CYMBALTA ) 20 MG capsule TAKE 2 CAPSULES BY MOUTH EVERY DAY   ELDERBERRY PO Take by mouth.   ELIQUIS  5 MG TABS tablet TAKE ONE (1) TABLET BY MOUTH TWO TIMES PER DAY   FINACEA 15 % FOAM Apply 1 application topically daily as needed (rosacea).    Galcanezumab -gnlm (EMGALITY ) 120 MG/ML SOAJ INJECT INTO THE SKIN EVERY 30 DAYS AS DIRECTED   magnesium  oxide (MAG-OX) 400 MG tablet Take 400 mg by mouth every morning.   methocarbamol  (ROBAXIN ) 500 MG tablet Take 1 tablet (500 mg total) by mouth every 6 (six) hours as needed for muscle spasms.   metoprolol  succinate (TOPROL -XL) 25 MG 24 hr tablet TAKE 1 TABLET BY MOUTH DAILY   omeprazole  (PRILOSEC) 20 MG capsule Take 1 capsule (20 mg total) by  mouth daily.   Probiotic Product (PROBIOTIC PO) Take 1 capsule by mouth daily.    rizatriptan  (MAXALT -MLT) 10 MG disintegrating tablet DISSOLVE 1 TABLET UNDER THE TONGUE AS NEEDED FOR MIGRAINE. MAY REPEAT IN 2 HOURS IF NEEDED.   trolamine salicylate (ASPERCREME) 10 % cream Apply 1 application topically as needed.   Ubrogepant  (UBRELVY ) 100 MG TABS Take 1 tablet (100 mg total) by mouth every 2 (two) hours as needed. Maximum 200mg  a day.     Allergies:   Adhesive [tape], Claritin [loratadine], Macrodantin [nitrofurantoin], Metronidazole, Nitrofurantoin macrocrystal, Sulfamethoxazole -trimethoprim , Cephalexin, Clindamycin hcl, Doxycycline, and Erythromycin   Social History   Tobacco Use   Smoking status: Never   Smokeless tobacco: Never  Vaping Use  Vaping status: Never Used  Substance Use Topics   Alcohol use: Not Currently    Comment: Due to AF   Drug use: No      Family Hx: [name] family history includes ADD / ADHD in her daughter and father; Arthritis in her father; Asthma in her daughter, daughter, and daughter; Colitis in her daughter; Depression in her father; Diabetes in her brother and mother; Fibromyalgia in her daughter; Hearing loss in her mother; Heart disease in her father and paternal grandfather; Heart failure in her father; Hypertension in her brother and mother; Interstitial cystitis in her daughter; Kidney disease in her mother; Migraines in her daughter; Obesity in her mother; Sleep apnea in her mother; Stroke in her father and mother; Thyroid  cancer (age of onset: 86) in her daughter. There is no history of Breast cancer.  ROS:   Please see the history of present illness.    Review of Systems  Constitutional: Negative.   HENT: Negative.    Respiratory: Negative.    Cardiovascular: Negative.   Gastrointestinal: Negative.   Musculoskeletal: Negative.   Neurological: Negative.   Psychiatric/Behavioral: Negative.    All other systems reviewed and are  negative.    Labs/Other Tests and Data Reviewed:    Recent Labs: 09/13/2022: ALT 13; BUN 19; Creat 0.87; Hemoglobin 13.7; Platelets 375; Potassium 4.8; Sodium 140   Recent Lipid Panel Lab Results  Component Value Date/Time   CHOL 200 (H) 09/13/2022 11:04 AM   CHOL 180 07/01/2020 10:15 AM   CHOL 164 04/17/2011 05:07 AM   TRIG 113 09/13/2022 11:04 AM   TRIG 83 04/17/2011 05:07 AM   HDL 51 09/13/2022 11:04 AM   HDL 49 07/01/2020 10:15 AM   HDL 43 04/17/2011 05:07 AM   CHOLHDL 3.9 09/13/2022 11:04 AM   LDLCALC 127 (H) 09/13/2022 11:04 AM   LDLCALC 104 (H) 04/17/2011 05:07 AM    Wt Readings from Last 3 Encounters:  02/14/23 228 lb 8 oz (103.6 kg)  12/20/22 227 lb (103 kg)  09/30/22 228 lb (103.4 kg)     Exam:    BP 130/64 (BP Location: Left Arm, Patient Position: Sitting, Cuff Size: Large)   Pulse 60   Ht 5' 6 (1.676 m)   Wt 228 lb 8 oz (103.6 kg)   SpO2 97%   BMI 36.88 kg/m  Constitutional:  oriented to person, place, and time. No distress.  HENT:  Head: Grossly normal Eyes:  no discharge. No scleral icterus.  Neck: No JVD, no carotid bruits  Cardiovascular: Regular rate and rhythm, no murmurs appreciated Pulmonary/Chest: Clear to auscultation bilaterally, no wheezes or rails Abdominal: Soft.  no distension.  no tenderness.  Musculoskeletal: Normal range of motion Neurological:  normal muscle tone. Coordination normal. No atrophy Skin: Skin warm and dry Psychiatric: normal affect, pleasant   ASSESSMENT & PLAN:    Atrial fibrillation/ flutter, unspecified type (HCC) Underwent ablation 2021, maintaining normal sinus rhythm Recommend she continue metoprolol  succinate 25 daily with Eliquis  5 twice daily  History of syncope In the setting of dehydration, hypotension, COVID end of December 2022 Denies recurrent episodes  Hyperlipidemia Prefers not to be on cholesterol medication Lifestyle modification recommended Continue exercise program   Signed, Evalene Lunger, MD  02/14/2023 3:08 PM    [facility] [address], [place], [place] [address]

## 2023-02-14 ENCOUNTER — Encounter: Payer: Self-pay | Admitting: Cardiovascular Disease

## 2023-02-14 ENCOUNTER — Ambulatory Visit: Payer: Medicare Other | Attending: Cardiovascular Disease | Admitting: Cardiovascular Disease

## 2023-02-14 VITALS — BP 130/64 | HR 60 | Ht 66.0 in | Wt 228.5 lb

## 2023-02-14 DIAGNOSIS — I480 Paroxysmal atrial fibrillation: Secondary | ICD-10-CM | POA: Insufficient documentation

## 2023-02-14 DIAGNOSIS — G4734 Idiopathic sleep related nonobstructive alveolar hypoventilation: Secondary | ICD-10-CM | POA: Diagnosis not present

## 2023-02-14 DIAGNOSIS — E782 Mixed hyperlipidemia: Secondary | ICD-10-CM | POA: Insufficient documentation

## 2023-02-14 DIAGNOSIS — G4733 Obstructive sleep apnea (adult) (pediatric): Secondary | ICD-10-CM | POA: Diagnosis not present

## 2023-02-14 DIAGNOSIS — I4892 Unspecified atrial flutter: Secondary | ICD-10-CM | POA: Diagnosis not present

## 2023-02-14 DIAGNOSIS — I484 Atypical atrial flutter: Secondary | ICD-10-CM | POA: Insufficient documentation

## 2023-02-14 MED ORDER — METOPROLOL SUCCINATE ER 25 MG PO TB24: ORAL_TABLET | ORAL | 3 refills | Status: AC

## 2023-02-14 MED ORDER — APIXABAN 5 MG PO TABS: 5.0000 mg | ORAL_TABLET | Freq: Two times a day (BID) | ORAL | 3 refills | Status: DC

## 2023-02-14 NOTE — Patient Instructions (Addendum)
 Medication Instructions:  Your physician recommends that you continue on your current medications as directed. Please refer to the Current Medication list given to you today.  If you need a refill on your cardiac medications before your next appointment, please call your pharmacy.   Lab work: No new labs needed  Testing/Procedures: No new testing needed  Follow-Up: At [facility], you and your health needs are our priority.  As part of our continuing mission to provide you with exceptional heart care, we have created designated Provider Care Teams.  These Care Teams include your primary Cardiologist (physician) and Advanced Practice Providers (APPs -  Physician Assistants and Nurse Practitioners) who all work together to provide you with the care you need, when you need it.  You will need a follow up appointment in 12 months  Providers on your designated Care Team:   Nicolasa Ducking, NP Eula Listen, PA-C Cadence Fransico Michael, [place]  COVID-19 Vaccine Information can be found at: [email or web address] For questions related to vaccine distribution or appointments, please email [email or web address] or call [phone].

## 2023-02-28 ENCOUNTER — Ambulatory Visit (HOSPITAL_COMMUNITY)
Admission: RE | Admit: 2023-02-28 | Discharge: 2023-02-28 | Disposition: A | Discharge status: Home / Self Care | Payer: Medicare Other | Source: Ambulatory Visit | Attending: Physician Assistant | Admitting: Physician Assistant

## 2023-02-28 ENCOUNTER — Encounter (HOSPITAL_COMMUNITY): Payer: Self-pay | Admitting: Physician Assistant

## 2023-02-28 VITALS — BP 124/66 | HR 63 | Ht 66.0 in | Wt 228.2 lb

## 2023-02-28 DIAGNOSIS — D6869 Other thrombophilia: Secondary | ICD-10-CM | POA: Insufficient documentation

## 2023-02-28 DIAGNOSIS — G4733 Obstructive sleep apnea (adult) (pediatric): Secondary | ICD-10-CM | POA: Insufficient documentation

## 2023-02-28 DIAGNOSIS — I4892 Unspecified atrial flutter: Secondary | ICD-10-CM | POA: Insufficient documentation

## 2023-02-28 DIAGNOSIS — Z7901 Long term (current) use of anticoagulants: Secondary | ICD-10-CM | POA: Diagnosis not present

## 2023-02-28 DIAGNOSIS — I480 Paroxysmal atrial fibrillation: Secondary | ICD-10-CM | POA: Insufficient documentation

## 2023-02-28 NOTE — Progress Notes (Signed)
Primary Care Physician: [name], MD Primary Cardiologist: Julien Nordmann, MD Electrophysiologist: Will Jorja Loa, MD  Referring Physician: Dr Donah Driver is a 68 y.o. female with a history of OSA, DM, atrial flutter, atrial fibrillation who presents for follow up in the [facility]. She is status post atrial fibrillation ablation 03/01/2019. She was previously on amiodarone. Patient is on Eliquis for stroke prevention.   Patient presents today for follow up for atrial fibrillation. She states that she will have very brief episodes (1-2 minutes) of palpitations rarely but denies any other afib symptoms. No bleeding issues on anticoagulation. She uses her CPAP regularly.   Today, she denies symptoms of palpitations, chest pain, shortness of breath, orthopnea, PND, lower extremity edema, dizziness, presyncope, syncope, bleeding, or neurologic sequela. The patient is tolerating medications without difficulties and is otherwise without complaint today.    Atrial Fibrillation Risk Factors:  she does have symptoms or diagnosis of sleep apnea. she does not have a history of rheumatic fever.   Atrial Fibrillation Management history:  Previous antiarrhythmic drugs: amiodarone  Previous cardioversions: none Previous ablations: 03/01/19 Anticoagulation history: Eliquis  ROS- All systems are reviewed and negative except as per the HPI above.  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  MammoSite   Calculus of kidney    Cancer (HCC) 2017   Left- DCIS   Depression    Diet-controlled type 2 diabetes mellitus (HCC)    Difficult intubation 2009   [facility]- anes had to use  'boogee" DURING  GALLBLADDER SURGERY ONLY-NO PROBLEMS SINCE   Female cystocele 01/05/2011   Fibromyalgia    Gallbladder problem    GERD (gastroesophageal reflux disease)    Headache(784.0)    MIGRAINES   History of kidney stones    IBS (irritable bowel syndrome)    Joint pain    Nocturnal hypoxemia 05/09/2022   Obesity    OSA (obstructive sleep apnea) 03/27/2019   USE CPAP   OSA (obstructive sleep apnea)    Personal history of radiation therapy 2017   LEFT lumpectomy   PONV (postoperative nausea and vomiting)    h/o difficult intubation and anes had to use "boogee"   Sleep apnea 2020   Vitamin D deficiency    Vitreous detachment of right eye    sees floaters    Current Outpatient Medications  Medication Sig Dispense Refill   acetaminophen (TYLENOL) 500 MG tablet Take 2 tablets (1,000 mg total) by mouth every 8 (eight) hours. OTC  0   apixaban (ELIQUIS) 5 MG TABS tablet Take 1 tablet (5 mg total) by mouth 2 (two) times daily. 180 tablet 3   Cholecalciferol (VITAMIN D3 GUMMIES) 25 MCG (1000 UT) CHEW Chew 2 each by mouth daily at 12 noon.     Cyanocobalamin (B-12-SL) 1000 MCG SUBL Place 1 each under the tongue daily at 12 noon.     diltiazem (CARDIZEM) 30 MG tablet Take 1 tablet (30 mg total) by mouth 4 (four) times daily as needed (atrial fibrillation or elevated heart rates). 30 tablet 0   DULoxetine (CYMBALTA) 20 MG capsule TAKE 2 CAPSULES BY MOUTH EVERY DAY 180 capsule 1   ELDERBERRY  PO Take by mouth.     FINACEA 15 % FOAM Apply 1 application topically daily as needed (rosacea).      Galcanezumab-gnlm (EMGALITY) 120 MG/ML SOAJ INJECT INTO THE SKIN EVERY 30 DAYS AS DIRECTED 1 mL 11   magnesium oxide (MAG-OX) 400 MG tablet Take 400 mg by mouth every morning.     methocarbamol (ROBAXIN) 500 MG tablet Take 1 tablet (500 mg total) by mouth every 6 (six) hours as needed for muscle spasms. 90 tablet 0   metoprolol succinate (TOPROL-XL) 25 MG 24 hr tablet TAKE 1 TABLET BY MOUTH DAILY 90 tablet 3    omeprazole (PRILOSEC) 20 MG capsule Take 1 capsule (20 mg total) by mouth daily. 90 capsule 3   Probiotic Product (PROBIOTIC PO) Take 1 capsule by mouth daily.      rizatriptan (MAXALT-MLT) 10 MG disintegrating tablet DISSOLVE 1 TABLET UNDER THE TONGUE AS NEEDED FOR MIGRAINE. MAY REPEAT IN 2 HOURS IF NEEDED. 27 tablet 4   trolamine salicylate (ASPERCREME) 10 % cream Apply 1 application topically as needed.     Ubrogepant (UBRELVY) 100 MG TABS Take 1 tablet (100 mg total) by mouth every 2 (two) hours as needed. Maximum 200mg  a day. 16 tablet 11   No current facility-administered medications for this encounter.    Physical Exam: BP 124/66   Pulse 63   Ht 5\' 6"  (1.676 m)   Wt 103.5 kg   BMI 36.83 kg/m   GEN: Well nourished, well developed in no acute distress CARDIAC: Regular rate and rhythm, no murmurs, rubs, gallops RESPIRATORY:  Clear to auscultation without rales, wheezing or rhonchi  ABDOMEN: Soft, non-tender, non-distended EXTREMITIES:  No edema; No deformity   Wt Readings from Last 3 Encounters:  02/28/23 103.5 kg  02/14/23 103.6 kg  12/20/22 103 kg     EKG today demonstrates  SR Vent. rate 63 BPM PR interval 140 ms QRS duration 84 ms QT/QTcB 402/411 ms    CHA2DS2-VASc Score = 3  [name] score is based upon: CHF History: 0 HTN History: 0 Diabetes History: 1 Stroke History: 0 Vascular Disease History: 0 Age Score: 1 Gender Score: 1       ASSESSMENT AND PLAN: Paroxysmal Atrial Fibrillation/atrial flutter [name] CHA2DS2-VASc score is 3, indicating a 3.2% annual risk of stroke.   S/p afib ablation 03/01/19 Patient appears to be maintaining SR Continue Eliquis 5 mg BID Continue Toprol 25 mg daily Continue diltiazem 30 mg PRN q 4 hours for heart racing  Secondary Hypercoagulable State (ICD10:  D68.69) The patient is at significant risk for stroke/thromboembolism based upon her CHA2DS2-VASc Score of 3.  Continue Apixaban (Eliquis).   OSA   Encouraged nightly CPAP    Follow up in the AF clinic in 1 year.        Jorja Loa PA-C Afib Clinic [facility] [address] [place], [place] [address] [phone]

## 2023-04-26 DIAGNOSIS — Z0000 Encounter for general adult medical examination without abnormal findings: Secondary | ICD-10-CM | POA: Diagnosis not present

## 2023-04-26 DIAGNOSIS — N3010 Interstitial cystitis (chronic) without hematuria: Secondary | ICD-10-CM | POA: Diagnosis not present

## 2023-04-26 DIAGNOSIS — G4733 Obstructive sleep apnea (adult) (pediatric): Secondary | ICD-10-CM | POA: Diagnosis not present

## 2023-04-26 DIAGNOSIS — G43909 Migraine, unspecified, not intractable, without status migrainosus: Secondary | ICD-10-CM | POA: Diagnosis not present

## 2023-04-26 DIAGNOSIS — M797 Fibromyalgia: Secondary | ICD-10-CM | POA: Diagnosis not present

## 2023-04-26 DIAGNOSIS — D519 Vitamin B12 deficiency anemia, unspecified: Secondary | ICD-10-CM | POA: Diagnosis not present

## 2023-04-26 DIAGNOSIS — I4892 Unspecified atrial flutter: Secondary | ICD-10-CM | POA: Diagnosis not present

## 2023-04-26 DIAGNOSIS — K219 Gastro-esophageal reflux disease without esophagitis: Secondary | ICD-10-CM | POA: Diagnosis not present

## 2023-05-04 ENCOUNTER — Telehealth (INDEPENDENT_AMBULATORY_CARE_PROVIDER_SITE_OTHER): Payer: Medicare Other | Admitting: Neurology

## 2023-05-04 DIAGNOSIS — G43009 Migraine without aura, not intractable, without status migrainosus: Secondary | ICD-10-CM | POA: Diagnosis not present

## 2023-05-04 DIAGNOSIS — G43711 Chronic migraine without aura, intractable, with status migrainosus: Secondary | ICD-10-CM

## 2023-05-04 NOTE — Progress Notes (Signed)
 [facility]    Provider:  Dr Sonya Lopez Requesting Provider: [name], Lopez Primary Care Provider:  [name], Lopez  Virtual Visit via Video Note  Lopez connected with Sonya Lopez on 05/04/2023 at 11:00 AM EDT by a video enabled telemedicine application and verified that Lopez am speaking with the correct person using two identifiers.  Location: Patient: home Provider: office   Lopez discussed the limitations of evaluation and management by telemedicine and the availability of in person appointments. The patient expressed understanding and agreed to proceed.   Follow Up Instructions:    Lopez discussed the assessment and treatment plan with the patient. The patient was provided an opportunity to ask questions and all were answered. The patient agreed with the plan and demonstrated an understanding of the instructions.   The patient was advised to call back or seek an in-person evaluation if the symptoms worsen or if the condition fails to improve as anticipated.  Lopez provided 15 minutes of non-face-to-face time during this encounter.   Sonya Fret, Lopez   CC:  Back pain, migraines, neck pain  05/04/2023: She has 1-2 moderate migraines a month(4 total migraine days a month) however she had 5 migraines in March. March has been difficult due to weather. However the emgality has been spectacular. We will continue.  < 10 total headaches days a month on emgality. No other focal neurologic deficits, associated symptoms, inciting events or modifiable factors.  Patient complains of symptoms per HPI as well as the following symptoms: none . Pertinent negatives and positives per HPI. All others negative   Thank you to my very gracious colleage Sonya. Vickey Lopez:  Sonya Lopez  Sonya Fret, Lopez; Sonya Lopez Lopez can follow up, yes. Sonya Lopez looked into her history and she has had a repeat sleep study with pulmonology, scored at that test AHI of 11/h , therefore  she qualified for CPAP. If she is still seen at [facility] for other reasons, Lopez don't mind to se her for sleep in follow up.  Follow up 05/05/2022:  Prior to emgality had 10 severe migraines a month, 5 moderate to severe and at least daily headaches now doing great continue. Here alone for migraine follow-up. Patient states they are much better with the Emgality. She states her insurance approval is up for renewal for [employer] She has only 4 migraines per month and < 10 total headache days a month She is very pleased with Emgality. Maxalt isn't working however. She goes to chiropractor and getting softwave and helping tremendously for back pain [facility] Sonya Lopez for back pain [place]. Migraines are doing great. She is on a cpap bu Sonya Lopez, DDS thinks she can use also the cpap and a mouth guard not seeing sleep doctor will ask if we can take over cpap management. Maxalt doesn't work acutely. naratriptan and maxalt and sumatriptan, nurtec did not work. Neck helped with chiropractor s/p ACDF for neck too. Afib under contol goes to the afib clinic. She is doing the best she has done in 15 years.  Patient complains of symptoms per HPI as well as the following symptoms: none . Pertinent negatives and positives per HPI. All others negative   Interval history 05/04/2020:   Migraines; doing "great, doing wonderful" she took maxalt, worked, had to take 2 once day. Takes as wonderful. Cpap: morning headaches reduced, doing much better S/p ACDF: She goes to chiropractor once a week and helps a lot.  Numbness at scar is stable, not bothering her Afib: under control, stay on eliquis on metoprolol Low back: Is fine. She still has sciatica, chiropractor helps, she is walking a lot more 8k-10k steps. She doesn't need injections in her back.  Patient complains of symptoms per HPI as well as the following symptoms: migraines much improved . Pertinent negatives and positives per HPI. All others  negative  Interval history: 10/22/2020  1.  Migraines, she is doing extremely well, only about 4 mild migraines a month, Maxalt is useful, Tylenol with codeine only when needed her migraines severe or refractory.  We will have to change her from Ajovy to [employer] due to insurance but she feels these injections have helped her tremendously. 2.  She is still on her CPAP machine, her morning headaches have significantly reduced 3.  She had ACDF, she does feel much better, she is going to a chiropractor who is also helping her with her hip pain and overall she feels less pain in general which is excellent.  She still has numbness in the area of the scar but that likely will go away. 4.  Her A. fib is under control, she states she has only had 2 episodes recently. 5. Changed to emgality from [place] and feels it is better   Sonya Lopez is getting married July 21st.   Update April 17, 2020: Patient was diagnosed with mild obstructive sleep apnea but hypoxia of 82% and is now under the care of the pulmonology groups, she feels improved is using CPAP and is compliant.  Cymbalta and gabapentin seem to be helping with pain symptoms.  She is seeing orthopedics for bilateral hip pain.  She has a history of atrial ablation, she had A. fib and had ablation done on February 20, 2018 she still on Eliquis and metoprolol but has been doing better.  For her migraines controlled since being on Ajovy, but increasing episodes of the past few months.  She is status post cervical fusion and decompression, with improved radiculopathy of the right arm.  She is being seen by Sonya. Carlynn Lopez for depression, history of diabetes, obesity.  She has history of B12 deficiency, last B12 in August 2021 was 269 we may consider repeating.  She has been having migraines 2-3 days a month. She tries naratriptan and maxalt, sometimes that helps but sometimes not, she had dry needling.she had it done in PT and her bursitis and sciatic and hip, she loves  her therapist Sonya Lopez) with [facility] in [place]. Tried rizatriptan, naratriptan, sumatriptan and not working will try [place]. And also will try Ajovy every 3 weeks. We discussed fatigue, will order labs. Will do ajovy every 3 weeks.   Update 10/16/2019: She has been having severe neck pain and radiation. MRi showed broad based disc protrusion c5-c6 causing severe right and moderate left foraminal stenosis, c6-c7 central right paracentral disc protrusion causing right ventral spinal cord abutment (reviewed CD images she brought today). Lopez discussed surgery and agree she needs ACDF. She also has degenrative disease in the lumbar spine but not severe and feeling better in the low back, she is having left-sided sciatica, it comes and goes but doesn't stay. We reviewed her MRI lumbar spine images with arthtritic changes and mild forminal stenosis but no nerve pinching.   No afib since the ablation. She is going to come off the Eliquis 3 days prior to surgery.  As far as migraines she is doing better, Ajovy alone is helping and the ones  she does have a few and they are very responsive to acute medications. Will hold off on botox. Will give her 6 months samples of Ajovy and see if we can get insurance to approve Ajovy if not we will try Emgality.   HPI 06/2018:  Sonya Lopez is a lovely 68 y.o. female here as requested by Sonya Cory, Lopez for migraines. PMHx migraines, lumbar radiculopathy, obesity, kidney stones, fibromyalgia, atrial fibrillation on long-term anticoagulation, diet-controlled diabetes 2, depression, breast neoplasm, anxiety.  Symptoms started in 1993. Bending over in the garden and felt a sharp pain in the left buttocks and hips. Worsening since then. Diagnosed with bursitis and lumbar radic. She always has pain down the left leg. Worse bending over to clean bathtub. Starts in the buttocks and has pain in the buttocks (points to the bursa) but radiates down the back of the  leg and tender in the lateral thigh. She has been to orthopaedics. She had physical therapy which helped the right side which started hurting but the left side never improved. She went back to orthopaedics. It aches all the time. Shooting pain bending over, when she walks a long time, she has sit down after walking a certain distance to rest. Resting and sitting helps, but sitting too long makes it worse. Getting up and walking helps if she doesn't go too far. She has paresthesias in the left lower leg. She also feels weakness in her left leg, her leg will "give out" slightly not enough to fall but weakness and sititing down helps. Weakness worse going up the steps.   She has left-side pain in the arm as well. She has tightness in the cervical areas. A lot of tightness. She has neck pain but doesn't c/o of radicular pain. And this hurts with the migraines as well. Discussed dry needling.She has tenderness at the base of the ventral palms. No numbness or tingling, no weakness. Worse with typing a lot during the day, no nocturnal awakenings.Consider wrist splints.   Migraines: She has a history of migraines. She has a headache 18 days of the month. 8-10 migraine days a month. She uses a triptan which helps. She takes amerge. She sometimes has to take a second. Some it doesn't. Migraines are on the left side, behind the eye sna temple, radiates to the left side of the head and into the left cervical muscles. Migrianes are oderately severe or severe, hurts so badly she can;t keep her eye open, nausea, no vomiting, light and sound sensitivity. movement makes it worse, ice helps. Unilateral, usually on the left but can be on the right. Ongoing at this frequency for > 10 years. Even since the 70s. She has just lived with it. In the last 10 years they are better than they were but still affecting life and can be very bad. She can't move, lay in a dark room. No new vision changes. She wakes up with headache in the  morning very often. She has afib, she snore sometimes, really fatigued, never wakes feeling rested, she wakes frequently, fatigued all day. She was diagnosed with fibromyalgia in 12/2000. She had pressure points, pain, sore, burning, difficulty concentrating, fatigue, tinging in the left toe and has some itching in the toe. She has had rashes on her feet and upper legs, she has joint pain in different areas, her fingers get sore. She has dry mouth, no color changes Lopez the fingers, she gets blisters on her tongue a lot and inner cheeks.  She feels her word-finding skills are impaired. She has been intelligent, she works hard, she was evaluated in the past for skipping grades growing up. She feels the word finding. It will eventually. She is tired. She has mild depression. She has been taking care of everyone else, her daughter, her mother, hr grandchild and she is tired and worn out. She taking care of herself more, feels it is getting better.   Meds tried: lyrica, Cymbalta, gabapentin, metoprolol, amerge, imipamine, propranolol, venlafaxine  Reviewed notes, labs and imaging from outside physicians, which showed:  Ct showed No acute intracranial abnormalities including mass lesion or mass effect, hydrocephalus, extra-axial fluid collection, midline shift, hemorrhage, or acute infarction, large ischemic events (personally reviewed images) 02/2018 ldl 108 hgba1c 5.5 Creating, BUN normal  02/2017: B12 643 Vit D 35 HgbA1c 6.4 TSH nml  Review of Systems: Patient complains of symptoms per HPI as well as the following symptoms: none . Pertinent negatives and positives per HPI. All others negative     Social History   Socioeconomic History   Marital status: Married    Spouse name: Theatre manager    Number of children: 3   Years of education: 16   Highest education level: Bachelor's degree (e.g., BA, AB, BS)  Occupational History   Occupation: Diplomatic Services operational officer  Tobacco Use   Smoking status: Never    Smokeless tobacco: Never   Tobacco comments:    Never smoked 02/28/23  Vaping Use   Vaping status: Never Used  Substance and Sexual Activity   Alcohol use: Not Currently    Comment: Due to AF   Drug use: No   Sexual activity: Yes    Partners: Male    Birth control/protection: Surgical  Other Topics Concern   Not on file  Social History Narrative   Lives at home with husband    Right handed   Caffeine: about 1 cup daily   Social Drivers of Health   Financial Resource Strain: Low Risk  (09/30/2022)   Overall Financial Resource Strain (CARDIA)    Difficulty of Paying Living Expenses: Not hard at all  Food Insecurity: No Food Insecurity (09/30/2022)   Hunger Vital Sign    Worried About Running Out of Food in the Last Year: Never true    Ran Out of Food in the Last Year: Never true  Transportation Needs: No Transportation Needs (09/30/2022)   PRAPARE - Administrator, Civil Service (Medical): No    Lack of Transportation (Non-Medical): No  Physical Activity: Sufficiently Active (09/30/2022)   Exercise Vital Sign    Days of Exercise per Week: 6 days    Minutes of Exercise per Session: 60 min  Stress: No Stress Concern Present (09/30/2022)   [employer] of Occupational Health - Occupational Stress Questionnaire    Feeling of Stress : Not at all  Social Connections: Socially Integrated (09/30/2022)   Social Connection and Isolation Panel [NHANES]    Frequency of Communication with Friends and Family: More than three times a week    Frequency of Social Gatherings with Friends and Family: More than three times a week    Attends Religious Services: More than 4 times per year    Active Member of [employer] or Organizations: Yes    Attends Banker Meetings: More than 4 times per year    Marital Status: Married  Catering manager Violence: Not At Risk (09/30/2022)   Humiliation, Afraid, Rape, and Kick questionnaire    Fear of Current or Ex-Partner:  No     Emotionally Abused: No    Physically Abused: No    Sexually Abused: No    Family History  Problem Relation Age of Onset   Diabetes Mother    Hearing loss Mother    Hypertension Mother    Stroke Mother    Kidney disease Mother    Sleep apnea Mother    Obesity Mother    Heart disease Father    Heart failure Father    Stroke Father    Depression Father    ADD / ADHD Father    Arthritis Father    Heart disease Paternal Grandfather    Asthma Daughter    ADD / ADHD Daughter    Asthma Daughter    Migraines Daughter    Fibromyalgia Daughter    Interstitial cystitis Daughter    Asthma Daughter    Colitis Daughter    Thyroid cancer Daughter 64   Diabetes Brother    Hypertension Brother    Breast cancer Neg Hx     Past Medical History:  Diagnosis Date   Actinic keratosis    Allergy    Anxiety    situational anxiety   Arrhythmia    A-fib/A-flutter   Atrial flutter (HCC)    Breast neoplasm, Tis (DCIS), 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  MammoSite   Calculus of kidney    Cancer (HCC) 2017   Left- DCIS   Depression    Diet-controlled type 2 diabetes mellitus (HCC)    Difficult intubation 2009   [facility]- anes had to use  'boogee" DURING GALLBLADDER SURGERY ONLY-NO PROBLEMS SINCE   Female cystocele 01/05/2011   Fibromyalgia    Gallbladder problem    GERD (gastroesophageal reflux disease)    Headache(784.0)    MIGRAINES   History of kidney stones    IBS (irritable bowel syndrome)    Joint pain    Nocturnal hypoxemia 05/09/2022   Obesity    OSA (obstructive sleep apnea) 03/27/2019   USE CPAP   OSA (obstructive sleep apnea)    Personal history of radiation therapy 2017   LEFT lumpectomy   PONV (postoperative nausea and vomiting)    h/o difficult intubation and anes had to use "boogee"   Sleep apnea 2020   Vitamin D deficiency    Vitreous detachment of right eye    sees floaters    Patient Active Problem  List   Diagnosis Date Noted   Hypercoagulable state due to paroxysmal atrial fibrillation (HCC) 02/28/2023   Sleep-related headache 07/20/2022   Sleep-related hypoxia 07/20/2022   Sleep related bruxism 07/20/2022   Nocturnal hypoxemia 05/09/2022   Chronic migraine without aura, with intractable migraine, so stated, with status migrainosus 05/09/2022   Cervical spondylosis with radiculopathy 05/09/2022   Sleep apnea 05/09/2022   Hypotension 01/31/2021   History of diabetes mellitus, type II 09/11/2019   OSA on CPAP 03/27/2019   Syncope 05/10/2018   Pain, elbow joint 02/02/2018   PAF (paroxysmal atrial fibrillation) (HCC) 09/13/2017   Trochanteric bursitis of left hip 06/24/2017   Low back pain 02/22/2017   Benign breast cyst in female, right 05/25/2016   History of left breast cancer 09/16/2015   Long-term use of high-risk medication 02/25/2015   Mild major depression (HCC) 07/29/2014   Gastric reflux 07/29/2014   Chronic interstitial cystitis 07/29/2014   Calculus of kidney 07/29/2014   Allergic rhinitis 07/29/2014   Acne erythematosa 07/29/2014   Lumbosacral radiculitis  07/29/2014   Tinnitus 07/29/2014   Mixed incontinence, urge and stress (female) (female) 08/23/2013   Hyperlipidemia 04/25/2013   H/O: hysterectomy 11/30/2011   Atrial flutter (HCC) 05/03/2011   Bladder cystocele 01/05/2011   Migraine without aura and without status migrainosus, not intractable 12/29/2010   Fibromyalgia 12/29/2010   Obesity 12/29/2010    Past Surgical History:  Procedure Laterality Date   ABDOMINAL HYSTERECTOMY  2013   ANTERIOR AND POSTERIOR REPAIR  11/30/2011   Procedure: ANTERIOR (CYSTOCELE) AND POSTERIOR REPAIR (RECTOCELE);  Surgeon: Martina Sinner, Lopez;  Location: [facility];  Service: Urology;  Laterality: N/A;  with cysto   ANTERIOR CERVICAL DECOMP/DISCECTOMY FUSION N/A 10/24/2019   Procedure: ANTERIOR CERVICAL DECOMPRESSION/DISCECTOMY FUSION 2 LEVELS C5-7;  Surgeon: Venetia Night, Lopez;  Location: [facility];  Service: Neurosurgery;  Laterality: N/A;   ATRIAL FIBRILLATION ABLATION N/A 03/01/2019   Procedure: ATRIAL FIBRILLATION ABLATION;  Surgeon: Regan Lemming, Lopez;  Location: [facility];  Service: Cardiovascular;  Laterality: N/A;   BLADDER SUSPENSION  11/30/2011   Procedure: SPARC PROCEDURE;  Surgeon: Martina Sinner, Lopez;  Location: [facility];  Service: Urology;  Laterality: N/A;  graft 10x6   BREAST BIOPSY Right 2007   negative core   BREAST BIOPSY Left 09/16/2015   DCIS   BREAST LUMPECTOMY Left 09/30/2015   DCIS clear margins and rad tx/HIGH GRADE DUCTAL CARCINOMA IN SITU, COMEDO TYPE   BREAST LUMPECTOMY WITH NEEDLE LOCALIZATION Left 09/30/2015   DCIS excision, MammoSite radiation;  Surgeon: Earline Mayotte, Lopez;  Location: [facility];  Service: General;  Laterality: Left;   CATARACT EXTRACTION Left 01/2019   CESAREAN SECTION     x 1   CHOLECYSTECTOMY  2005   COLONOSCOPY  2009   CYSTOSCOPY  11/30/2011   Procedure: CYSTOSCOPY;  Surgeon: Martina Sinner, Lopez;  Location: [facility];  Service: Urology;;   DILATION AND CURETTAGE OF UTERUS  05/05/2007   GALLBLADDER SURGERY  2004   SPINE SURGERY  2022   TONSILLECTOMY  1961   VAGINAL HYSTERECTOMY  11/30/2011   Procedure: HYSTERECTOMY VAGINAL;  Surgeon: Allie Bossier, Lopez;  Location: [facility];  Service: Gynecology;  Laterality: N/A;    Current Outpatient Medications  Medication Sig Dispense Refill   acetaminophen (TYLENOL) 500 MG tablet Take 2 tablets (1,000 mg total) by mouth every 8 (eight) hours. OTC  0   apixaban (ELIQUIS) 5 MG TABS tablet Take 1 tablet (5 mg total) by mouth 2 (two) times daily. 180 tablet 3   Cholecalciferol (VITAMIN D3 GUMMIES) 25 MCG (1000 UT) CHEW Chew 2 each by mouth daily at 12 noon.     Cyanocobalamin (B-12-SL) 1000 MCG SUBL Place 1 each under the tongue daily at 12 noon.     diltiazem (CARDIZEM) 30 MG tablet Take 1 tablet (30 mg total) by mouth 4 (four) times daily as  needed (atrial fibrillation or elevated heart rates). 30 tablet 0   DULoxetine (CYMBALTA) 20 MG capsule TAKE 2 CAPSULES BY MOUTH EVERY DAY 180 capsule 1   ELDERBERRY PO Take by mouth.     FINACEA 15 % FOAM Apply 1 application topically daily as needed (rosacea).      Galcanezumab-gnlm (EMGALITY) 120 MG/ML SOAJ Inject 120 mg into the skin every 30 (thirty) days. 1 mL 11   magnesium oxide (MAG-OX) 400 MG tablet Take 400 mg by mouth every morning.     methocarbamol (ROBAXIN) 500 MG tablet Take 1 tablet (500 mg total) by mouth every 6 (six)  hours as needed for muscle spasms. 90 tablet 0   metoprolol succinate (TOPROL-XL) 25 MG 24 hr tablet TAKE 1 TABLET BY MOUTH DAILY 90 tablet 3   omeprazole (PRILOSEC) 20 MG capsule Take 1 capsule (20 mg total) by mouth daily. 90 capsule 3   Probiotic Product (PROBIOTIC PO) Take 1 capsule by mouth daily.      rizatriptan (MAXALT-MLT) 10 MG disintegrating tablet Take 1 tablet (10 mg total) by mouth as needed for migraine. May repeat in 2 hours if needed 27 tablet 4   trolamine salicylate (ASPERCREME) 10 % cream Apply 1 application topically as needed.     Ubrogepant (UBRELVY) 100 MG TABS Take 1 tablet (100 mg total) by mouth every 2 (two) hours as needed. Maximum 200mg  a day. 16 tablet 11   No current facility-administered medications for this visit.    Allergies as of 05/04/2023 - Review Complete 02/28/2023  Allergen Reaction Noted   Adhesive [tape] Dermatitis 11/23/2011   Claritin [loratadine] Swelling 07/01/2020   Macrodantin [nitrofurantoin] Nausea And Vomiting 11/15/2011   Metronidazole Nausea Only 07/29/2014   Nitrofurantoin macrocrystal Nausea And Vomiting 01/31/2015   Sulfamethoxazole-trimethoprim Nausea Only 07/29/2014   Cephalexin Rash 12/28/2010   Clindamycin hcl Rash 01/31/2015   Doxycycline Swelling and Rash 12/28/2010   Erythromycin Rash 11/15/2011    Vitals: There were no vitals taken for this visit. Last Weight:  Wt Readings from Last 1  Encounters:  02/28/23 228 lb 3.2 oz (103.5 kg)   Last Height:   Ht Readings from Last 1 Encounters:  02/28/23 5\' 6"  (1.676 m)    Physical exam: Exam: Gen: NAD, conversant      CV: No palpitations or chest pain or SOB. VS: Breathing at a normal rate. Not febrile. Eyes: Conjunctivae clear without exudates or hemorrhage  Neuro: Detailed Neurologic Exam  Speech:    Speech is normal; fluent and spontaneous with normal comprehension.  Cognition:    The patient is oriented to person, place, and time;     recent and remote memory intact;     language fluent;     normal attention, concentration, fund of knowledge Cranial Nerves:    The pupils are equal, round, and reactive to light. Visual fields are full Extraocular movements are intact.  The face is symmetric with normal sensation. The palate elevates in the midline. Hearing intact. Voice is normal. Shoulder shrug is normal. The tongue has normal motion without fasciculations.   Coordination: normal  Gait:    No abnormalities noted or reported  Motor Observation:   no involuntary movements noted. Tone:    Appears normal  Posture:    Posture is normal. normal erect    Strength:    Strength is anti-gravity and symmetric in the upper and lower limbs.      Sensation: intact to LT, no reports of numbness or tingling or paresthesias         Assessment/Plan:  Sonya Lopez is a lovely 68 y.o. female here as requested by Sonya Cory, Lopez for migraines, neck pain, low back pain. PMHx migraines, lumbar and cervical  radiculopathy, obesity, kidney stones, fibromyalgia, atrial fibrillation on long-term anticoagulation, diet-controlled diabetes 2, depression, breast neoplasm, anxiety.   Migraines: Prior to emgality had 15 moderate to severe migraines a month,and at least daily headaches now doing great only 4-5 migraine days a month and  10 total headache days a month: Doing well, continue emgality, maxalt, ubrelvy Sleep:  got a cpap from pulm. Spoke to Sonya. Vickey Lopez  at length; Sonya. Vickey Lopez graciously agreed to take over her care.  S/p ACDF: She goes to chiropractor once a week and helps a lot. Numbness at scar is stable, not bothering her, feels great Low back: Is fine. She still has sciatica, chiropractor helps, she is walking a lot more 8k-10k steps. She doesn't need injections in her back. She feels great    Meds ordered this encounter  Medications   Galcanezumab-gnlm (EMGALITY) 120 MG/ML SOAJ    Sig: Inject 120 mg into the skin every 30 (thirty) days.    Dispense:  1 mL    Refill:  11   rizatriptan (MAXALT-MLT) 10 MG disintegrating tablet    Sig: Take 1 tablet (10 mg total) by mouth as needed for migraine. May repeat in 2 hours if needed    Dispense:  27 tablet    Refill:  4    3 month supply   Ubrogepant (UBRELVY) 100 MG TABS    Sig: Take 1 tablet (100 mg total) by mouth every 2 (two) hours as needed. Maximum 200mg  a day.    Dispense:  16 tablet    Refill:  11    She has only 4-5 migraines per month and < 10 total headache days a month  Tried: Maxalt, naratriptan and sumatriptan, nurtec did not work.      F/u one year   PRIOR ASSESSMENT AND PLANS    - Patient was diagnosed with mild obstructive sleep apnea but hypoxia of 82% and is now under the care of the pulmonology groups, she feels improved is using CPAP and is compliant.  Morning headaches improved.  - Cymbalta and gabapentin seem to be helping with pain symptoms.  However Gabapentin can cause fatigue, recommend she try to decrease or use prn  - She is seeing chiropractor for bilateral hip pain. Dry needling has helped tremendously and she is having it done on her cervical muscles as well.  She feels much better overall as far as pain.  -  She has a history of atrial ablation, she had A. fib and had ablation done on February 20, 2018 she still on Eliquis and metoprolol but has been doing better. No more episodes of heart palpitations.   She is only had 2 in recent months.  - For her migraines controlled since being on Ajovy, It wears out by the end of the month. Try it q3weeks.  At this time we have to change to [facility] unfortunately Lopez will call in a prescription.  For acute management continue Maxalt and Tylenol 3 for severe refractory migraines.  Only having 3-4 a month.  -  She is status post cervical fusion and decompression, with improved radiculopathy of the right arm.  She is seeing a chiropractor and feeling much better.  She still has the numbness in her neck at the scar but that will likely be permanent.  - Obesity and fatigue: Referred in the past to [facility]  - She is being seen by Sonya. Carlynn Lopez for depression, history of diabetes, obesity.  Continue.  - She has history of B12 deficiency, last B12 in August 2021 was 269 repeat in March 2022 was 845 which is great  - Tried rizatriptan, naratriptan, sumatriptan and not working will tried [place].  Continue Maxalt and Tylenol 3 with severe refractory migraines however much improved only 3-4 mild migraines a month.  -Fatigue improved.  Meds ordered this encounter  Medications   Galcanezumab-gnlm (EMGALITY) 120 MG/ML SOAJ  Sig: Inject 120 mg into the skin every 30 (thirty) days.    Dispense:  1 mL    Refill:  11   rizatriptan (MAXALT-MLT) 10 MG disintegrating tablet    Sig: Take 1 tablet (10 mg total) by mouth as needed for migraine. May repeat in 2 hours if needed    Dispense:  27 tablet    Refill:  4    3 month supply   Ubrogepant (UBRELVY) 100 MG TABS    Sig: Take 1 tablet (100 mg total) by mouth every 2 (two) hours as needed. Maximum 200mg  a day.    Dispense:  16 tablet    Refill:  11    She has only 4-5 migraines per month and < 10 total headache days a month  Tried: Maxalt, naratriptan and sumatriptan, nurtec did not work.    No orders of the defined types were placed in this encounter.     PRIOR: - She has been having  severe neck pain and radiation. MRi showed broad based disc protrusion c5-c6 causing severe right and moderate left foraminal stenosis, c6-c7 central right paracentral disc protrusion causing right ventral spinal cord abutment (reviewed CD images she brought today). Lopez discussed surgery and agree she needs ACDF. She is scheduled next week.  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 We reviewed her MRI lumbar spine images with arthtritic changes and mild forminal stenosis but no frank nerve pinching. Still if symptoms worsen she may consider ESI and if severe would consider surgical intervention because of some broad disk bulges l4/l5 and l5/s1  - No afib since the ablation. She is going to come off the Eliquis 3 days prior to surgery. There is risk of stroke during those 3 days but much less due to treatment of afib. Discussed.  - As far as migraines she is doing better, Ajovy alone is helping and the ones she does have a few and they are very responsive to acute medications. Will hold off on botox. Will give her 6 months samples of Ajovy and see if we can get insurance to approve Ajovy if not we will try Emgality.   - For her cervical myofascial pain syndrome:  Dry needling of the cervical muscles. [employer]. DRY NEEDLING: Patient has cervical myofascial pain syndrome, cervicalgia causing disability, pain, decreased range of motion, and contributing to migraines. Referred in the past, hold off for now.  -Hand pain: likely due to cervical disc disease (surgery next week) but if symptoms continue and she has CTS then may need emg/ncs in the future, may be overuse, consider wrist splints when typing a lot or with symptoms and will follow clinically.   -Patient being treated for sleep apnea   No orders of the defined types were placed in this encounter.   Meds  ordered this encounter  Medications   Galcanezumab-gnlm (EMGALITY) 120 MG/ML SOAJ    Sig: Inject 120 mg into the skin every 30 (thirty) days.    Dispense:  1 mL    Refill:  11   rizatriptan (MAXALT-MLT) 10 MG disintegrating tablet    Sig: Take 1 tablet (10 mg total) by mouth as needed for migraine. May repeat in 2 hours if needed    Dispense:  27 tablet    Refill:  4    3 month supply   Ubrogepant (  UBRELVY) 100 MG TABS    Sig: Take 1 tablet (100 mg total) by mouth every 2 (two) hours as needed. Maximum 200mg  a day.    Dispense:  16 tablet    Refill:  11    She has only 4-5 migraines per month and < 10 total headache days a month  Tried: Maxalt, naratriptan and sumatriptan, nurtec did not work.     Cc: Sonya Cory, Lopez   Naomie Dean, Lopez  [facility] [address] [place], [place] [address]  Phone [phone] Fax [phone]

## 2023-05-08 ENCOUNTER — Encounter: Payer: Self-pay | Admitting: Neurology

## 2023-05-08 MED ORDER — UBRELVY 100 MG PO TABS: 100.0000 mg | ORAL_TABLET | ORAL | 11 refills | Status: DC | PRN

## 2023-05-08 MED ORDER — EMGALITY 120 MG/ML ~~LOC~~ SOAJ: 120.0000 mg | SUBCUTANEOUS | 11 refills | Status: DC

## 2023-05-08 MED ORDER — RIZATRIPTAN BENZOATE 10 MG PO TBDP: 10.0000 mg | ORAL_TABLET | ORAL | 4 refills | Status: DC | PRN

## 2023-05-31 ENCOUNTER — Other Ambulatory Visit (HOSPITAL_COMMUNITY): Payer: Self-pay

## 2023-05-31 ENCOUNTER — Telehealth: Payer: Self-pay | Admitting: Pharmacist

## 2023-05-31 NOTE — Telephone Encounter (Signed)
 Pharmacy Patient Advocate Encounter  Insurance verification completed.   The patient is insured through [facility]   Ran test claim for Ubrelvy  100MG  tablets.  The current 30 day co-pay is, $0.00.  No PA needed at this time.  This test claim was processed through [facility]- copay amounts may vary at other pharmacies due to pharmacy/plan contracts, or as the patient moves through the different stages of their insurance plan.

## 2023-06-17 ENCOUNTER — Telehealth: Payer: Self-pay

## 2023-06-17 ENCOUNTER — Other Ambulatory Visit (HOSPITAL_COMMUNITY): Payer: Self-pay

## 2023-06-17 NOTE — Telephone Encounter (Signed)
 Pharmacy Patient Advocate Encounter   Received notification from CoverMyMeds that prior authorization for Ubrelvy  100MG  tablets is required/requested.   Insurance verification completed.   The patient is insured through [facility] .   Per test claim: PA required; PA submitted to above mentioned insurance via CoverMyMeds Key/confirmation #/EOC [id number] Status is pending

## 2023-06-17 NOTE — Telephone Encounter (Signed)
 Pharmacy Patient Advocate Encounter  Received notification from [facility] that Prior Authorization for Ubrelvy  100MG  tablets has been APPROVED from 06/17/2023 to 06/16/2024. Ran test claim, Copay is $0. This test claim was processed through [facility]- copay amounts may vary at other pharmacies due to pharmacy/plan contracts, or as the patient moves through the different stages of their insurance plan.   PA #/Case ID/Reference #: PA Case ID #: [id number]

## 2023-07-01 ENCOUNTER — Emergency Department
Admission: EM | Admit: 2023-07-01 | Discharge: 2023-07-01 | Disposition: A | Discharge status: Home / Self Care | Attending: Emergency Medicine | Admitting: Emergency Medicine

## 2023-07-01 ENCOUNTER — Emergency Department

## 2023-07-01 ENCOUNTER — Other Ambulatory Visit: Payer: Self-pay

## 2023-07-01 ENCOUNTER — Encounter: Payer: Self-pay | Admitting: Emergency Medicine

## 2023-07-01 DIAGNOSIS — N134 Hydroureter: Secondary | ICD-10-CM | POA: Diagnosis not present

## 2023-07-01 DIAGNOSIS — N281 Cyst of kidney, acquired: Secondary | ICD-10-CM | POA: Diagnosis not present

## 2023-07-01 DIAGNOSIS — N132 Hydronephrosis with renal and ureteral calculous obstruction: Secondary | ICD-10-CM | POA: Insufficient documentation

## 2023-07-01 DIAGNOSIS — Z853 Personal history of malignant neoplasm of breast: Secondary | ICD-10-CM | POA: Insufficient documentation

## 2023-07-01 DIAGNOSIS — K5730 Diverticulosis of large intestine without perforation or abscess without bleeding: Secondary | ICD-10-CM | POA: Diagnosis not present

## 2023-07-01 DIAGNOSIS — N201 Calculus of ureter: Secondary | ICD-10-CM

## 2023-07-01 DIAGNOSIS — R109 Unspecified abdominal pain: Secondary | ICD-10-CM | POA: Diagnosis present

## 2023-07-01 LAB — CBC
HCT: 38.4 % (ref 36.0–46.0)
Hemoglobin: 12.9 g/dL (ref 12.0–15.0)
MCH: 30.5 pg (ref 26.0–34.0)
MCHC: 33.6 g/dL (ref 30.0–36.0)
MCV: 90.8 fL (ref 80.0–100.0)
Platelets: 314 10*3/uL (ref 150–400)
RBC: 4.23 MIL/uL (ref 3.87–5.11)
RDW: 14.6 % (ref 11.5–15.5)
WBC: 10.5 10*3/uL (ref 4.0–10.5)
nRBC: 0 % (ref 0.0–0.2)

## 2023-07-01 LAB — URINALYSIS, ROUTINE W REFLEX MICROSCOPIC
Bilirubin Urine: NEGATIVE
Glucose, UA: NEGATIVE mg/dL
Hgb urine dipstick: NEGATIVE
Ketones, ur: NEGATIVE mg/dL
Leukocytes,Ua: NEGATIVE
Nitrite: NEGATIVE
Protein, ur: NEGATIVE mg/dL
Specific Gravity, Urine: 1.023 (ref 1.005–1.030)
pH: 5 (ref 5.0–8.0)

## 2023-07-01 LAB — BASIC METABOLIC PANEL WITH GFR
Anion gap: 10 (ref 5–15)
BUN: 23 mg/dL (ref 8–23)
CO2: 23 mmol/L (ref 22–32)
Calcium: 9 mg/dL (ref 8.9–10.3)
Chloride: 104 mmol/L (ref 98–111)
Creatinine, Ser: 0.87 mg/dL (ref 0.44–1.00)
GFR, Estimated: >60 mL/min (ref >60)
Glucose, Bld: 157 mg/dL — ABNORMAL HIGH (ref 70–99)
Potassium: 4.1 mmol/L (ref 3.5–5.1)
Sodium: 137 mmol/L (ref 135–145)

## 2023-07-01 MED ORDER — MORPHINE SULFATE (PF) 4 MG/ML IV SOLN
4.0000 mg | Freq: Once | INTRAVENOUS | Status: AC
  Administered 2023-07-01: 4 mg via INTRAVENOUS
  Filled 2023-07-01: qty 1

## 2023-07-01 MED ORDER — FENTANYL CITRATE PF 50 MCG/ML IJ SOSY
50.0000 ug | PREFILLED_SYRINGE | Freq: Once | INTRAMUSCULAR | Status: AC
  Administered 2023-07-01: 50 ug via INTRAVENOUS
  Filled 2023-07-01: qty 1

## 2023-07-01 MED ORDER — ONDANSETRON HCL 4 MG/2ML IJ SOLN
4.0000 mg | Freq: Once | INTRAMUSCULAR | Status: AC
  Administered 2023-07-01: 4 mg via INTRAVENOUS
  Filled 2023-07-01: qty 2

## 2023-07-01 MED ORDER — ONDANSETRON 4 MG PO TBDP: 4.0000 mg | ORAL_TABLET | Freq: Three times a day (TID) | ORAL | 0 refills | Status: DC | PRN

## 2023-07-01 MED ORDER — SODIUM CHLORIDE 0.9 % IV BOLUS
1000.0000 mL | Freq: Once | INTRAVENOUS | Status: AC
  Administered 2023-07-01: 1000 mL via INTRAVENOUS

## 2023-07-01 MED ORDER — TAMSULOSIN HCL 0.4 MG PO CAPS
0.4000 mg | ORAL_CAPSULE | Freq: Every day | ORAL | 0 refills | Status: DC
Start: 2023-07-01 — End: 2023-09-14

## 2023-07-01 MED ORDER — OXYCODONE HCL 5 MG PO TABS: 5.0000 mg | ORAL_TABLET | Freq: Three times a day (TID) | ORAL | 0 refills | Status: DC | PRN

## 2023-07-01 NOTE — ED Triage Notes (Signed)
 Patient to ED via POV for right sided flank pain. Started this AM around 0420. HX of kidney stones. States she is having difficulty urinating.

## 2023-07-01 NOTE — ED Provider Notes (Signed)
 [facility] Provider Note    Event Date/Time   First MD Initiated Contact with Patient 07/01/23 [phone]     (approximate)   History   Flank Pain   HPI  Sonya Lopez is a 68 y.o. female with tree of migraine, fibromyalgia, hyperlipidemia, atrial flutter, breast cancer, kidney stone and as listed in EMR presents to the emergency department for treatment and evaluation of sudden onset right flank pain that started at 0420 this am. Symptoms also include nausea. History of kidney stones with the last being several years ago. She feels as though she needs to urinate, but is only able to go a small amount. No known fever.      Physical Exam   Triage Vital Signs: ED Triage Vitals  Encounter Vitals Group     BP 07/01/23 0838 109/63     Systolic BP Percentile --      Diastolic BP Percentile --      Pulse Rate 07/01/23 0838 (!) 50     Resp 07/01/23 0838 17     Temp 07/01/23 0838 (!) 97.5 F (36.4 C)     Temp Source 07/01/23 0838 Oral     SpO2 07/01/23 0838 97 %     Weight 07/01/23 0836 222 lb (100.7 kg)     Height 07/01/23 0836 5\' 6"  (1.676 m)     Head Circumference --      Peak Flow --      Pain Score 07/01/23 0836 10     Pain Loc --      Pain Education --      Exclude from Growth Chart --     Most recent vital signs: Vitals:   07/01/23 0838  BP: 109/63  Pulse: (!) 50  Resp: 17  Temp: (!) 97.5 F (36.4 C)  SpO2: 97%    General: Awake, no distress.  CV:  Good peripheral perfusion.  Resp:  Normal effort.  Abd:  No distention.  Other:  Right CVA tenderness.   ED Results / Procedures / Treatments   Labs (all labs ordered are listed, but only abnormal results are displayed) Labs Reviewed  URINALYSIS, ROUTINE W REFLEX MICROSCOPIC - Abnormal; Notable for the following components:      Result Value   Color, Urine YELLOW (*)    APPearance HAZY (*)    All other components within normal limits  BASIC METABOLIC PANEL WITH GFR - Abnormal;  Notable for the following components:   Glucose, Bld 157 (*)    All other components within normal limits  CBC     EKG  Not indicated.   RADIOLOGY  Image and radiology report reviewed and interpreted by me. Radiology report consistent with the same.  3 mm stone at the right UVJ with moderate hydronephrosis and hydroureter.  PROCEDURES:  Critical Care performed: No  Procedures   MEDICATIONS ORDERED IN ED:  Medications  fentaNYL  (SUBLIMAZE ) injection 50 mcg (50 mcg Intravenous Given 07/01/23 0940)  ondansetron  (ZOFRAN ) injection 4 mg (4 mg Intravenous Given 07/01/23 0939)  morphine (PF) 4 MG/ML injection 4 mg (4 mg Intravenous Given 07/01/23 1103)  sodium chloride  0.9 % bolus 1,000 mL (1,000 mLs Intravenous New Bag/Given 07/01/23 1131)  ondansetron  (ZOFRAN ) injection 4 mg (4 mg Intravenous Given 07/01/23 1129)     IMPRESSION / MDM / ASSESSMENT AND PLAN / ED COURSE   I have reviewed the triage note.  Differential diagnosis includes, but is not limited to, kidney stone, pyelonephritis, acute cystitis  Patient's presentation is most consistent with acute complicated illness / injury requiring diagnostic workup.  69 year old female presenting to the emergency department for treatment and evaluation of right sided flank pain that suddenly awakened her at 420 this morning.  Symptoms most consistent with kidney stone.  Plan will be to get a CT renal stone study and give fluids, pain medication, and nausea meds.  Patient aware and agreeable to this plan.  Clinical Course as of 07/01/23 1232  Fri Jul 01, 2023  1113 Results reviewed with patient. Pain has improved, but continues to feel nauseated. Addition medication ordered in additions to IV fluids. [CT]  1230 Patient feeling much improved.  IV fluids are infusing and have about 400 mL left.  Plan will be to discharge her home after fluids have finished.  She is comfortable and agreeable to this plan.  She is to see primary care or  the urologist if not improving over the next few days.  ER return precautions were discussed as well. [CT]    Clinical Course User Index [CT] [name], FNP     FINAL CLINICAL IMPRESSION(S) / ED DIAGNOSES   Final diagnoses:  Ureterolithiasis     Rx / DC Orders   ED Discharge Orders          Ordered    oxyCODONE  (ROXICODONE ) 5 MG immediate release tablet  Every 8 hours PRN        07/01/23 1226    tamsulosin  (FLOMAX ) 0.4 MG CAPS capsule  Daily        07/01/23 1226    ondansetron  (ZOFRAN -ODT) 4 MG disintegrating tablet  Every 8 hours PRN        07/01/23 1226             Note:  This document was prepared using Dragon voice recognition software and may include unintentional dictation errors.   Sherryle Don, FNP 07/01/23 1232    Shane Darling, MD 07/01/23 1256

## 2023-07-01 NOTE — Discharge Instructions (Signed)
 Please follow-up with urology if not improving over the next few days.  If symptoms change or worsen and you are unable to see your primary care provider or the urologist, please return to the emergency department.  Please be aware that the pain medication may make you dizzy, sleepy, and at higher risk for falls.  Change positions slowly.  Do not drive, climb step ladders, or other activities that may cause you to be off balance for at least 8 hours after taking your last dose.

## 2023-07-04 ENCOUNTER — Other Ambulatory Visit (HOSPITAL_COMMUNITY): Payer: Self-pay

## 2023-07-04 ENCOUNTER — Other Ambulatory Visit: Payer: Self-pay | Admitting: Neurology

## 2023-07-04 ENCOUNTER — Telehealth: Payer: Self-pay

## 2023-07-04 DIAGNOSIS — G43711 Chronic migraine without aura, intractable, with status migrainosus: Secondary | ICD-10-CM

## 2023-07-04 NOTE — Telephone Encounter (Signed)
 Pharmacy Patient Advocate Encounter  Received notification from [facility] that Prior Authorization for Emgality  120MG /ML auto-injectors (migraine) has been APPROVED from 07/04/2023 to 07/03/2024. Ran test claim, Copay is $0. This test claim was processed through [facility]- copay amounts may vary at other pharmacies due to pharmacy/plan contracts, or as the patient moves through the different stages of their insurance plan.   PA #/Case ID/Reference #: N/A

## 2023-07-04 NOTE — Telephone Encounter (Signed)
 Pharmacy Patient Advocate Encounter   Received notification from CoverMyMeds that prior authorization for Emgality  120MG /ML auto-injectors (migraine) is required/requested.   Insurance verification completed.   The patient is insured through [facility] .   Per test claim: PA required; PA submitted to above mentioned insurance via CoverMyMeds Key/confirmation #/EOC [id number] Status is pending

## 2023-07-05 ENCOUNTER — Encounter: Payer: Self-pay | Admitting: Dermatology

## 2023-07-05 ENCOUNTER — Ambulatory Visit: Payer: Medicare Other | Admitting: Dermatology

## 2023-07-05 DIAGNOSIS — Z1283 Encounter for screening for malignant neoplasm of skin: Secondary | ICD-10-CM | POA: Diagnosis not present

## 2023-07-05 DIAGNOSIS — L719 Rosacea, unspecified: Secondary | ICD-10-CM

## 2023-07-05 DIAGNOSIS — W908XXA Exposure to other nonionizing radiation, initial encounter: Secondary | ICD-10-CM

## 2023-07-05 DIAGNOSIS — D2272 Melanocytic nevi of left lower limb, including hip: Secondary | ICD-10-CM

## 2023-07-05 DIAGNOSIS — L709 Acne, unspecified: Secondary | ICD-10-CM

## 2023-07-05 DIAGNOSIS — D229 Melanocytic nevi, unspecified: Secondary | ICD-10-CM | POA: Diagnosis not present

## 2023-07-05 DIAGNOSIS — D2239 Melanocytic nevi of other parts of face: Secondary | ICD-10-CM

## 2023-07-05 DIAGNOSIS — D2371 Other benign neoplasm of skin of right lower limb, including hip: Secondary | ICD-10-CM | POA: Diagnosis not present

## 2023-07-05 DIAGNOSIS — L821 Other seborrheic keratosis: Secondary | ICD-10-CM

## 2023-07-05 DIAGNOSIS — L738 Other specified follicular disorders: Secondary | ICD-10-CM

## 2023-07-05 DIAGNOSIS — D2271 Melanocytic nevi of right lower limb, including hip: Secondary | ICD-10-CM

## 2023-07-05 DIAGNOSIS — L814 Other melanin hyperpigmentation: Secondary | ICD-10-CM | POA: Diagnosis not present

## 2023-07-05 DIAGNOSIS — L578 Other skin changes due to chronic exposure to nonionizing radiation: Secondary | ICD-10-CM | POA: Diagnosis not present

## 2023-07-05 DIAGNOSIS — D1801 Hemangioma of skin and subcutaneous tissue: Secondary | ICD-10-CM

## 2023-07-05 DIAGNOSIS — D239 Other benign neoplasm of skin, unspecified: Secondary | ICD-10-CM

## 2023-07-05 MED ORDER — TRETINOIN 0.05 % EX CREA: TOPICAL_CREAM | CUTANEOUS | 5 refills | Status: AC

## 2023-07-05 NOTE — Patient Instructions (Addendum)
 Start Tretinoin 0.05% cream pea-sized amount to face at bedtime, wash off in morning. Continue Skin Medicinals Azelaic Acid: 15%, Niacinamide: 2% cr every morning.    Recommend daily broad spectrum sunscreen SPF 30+ to sun-exposed areas, reapply every 2 hours as needed. Call for new or changing lesions.  Staying in the shade or wearing long sleeves, sun glasses (UVA+UVB protection) and wide brim hats (4-inch brim around the entire circumference of the hat) are also recommended for sun protection.      Melanoma ABCDEs  Melanoma is the most dangerous type of skin cancer, and is the leading cause of death from skin disease.  You are more likely to develop melanoma if you: Have light-colored skin, light-colored eyes, or red or blond hair Spend a lot of time in the sun Tan regularly, either outdoors or in a tanning bed Have had blistering sunburns, especially during childhood Have a close family member who has had a melanoma Have atypical moles or large birthmarks  Early detection of melanoma is key since treatment is typically straightforward and cure rates are extremely high if we catch it early.   The first sign of melanoma is often a change in a mole or a new dark spot.  The ABCDE system is a way of remembering the signs of melanoma.  A for asymmetry:  The two halves do not match. B for border:  The edges of the growth are irregular. C for color:  A mixture of colors are present instead of an even brown color. D for diameter:  Melanomas are usually (but not always) greater than 6mm - the size of a pencil eraser. E for evolution:  The spot keeps changing in size, shape, and color.  Please check your skin once per month between visits. You can use a small mirror in front and a large mirror behind you to keep an eye on the back side or your body.   If you see any new or changing lesions before your next follow-up, please call to schedule a visit.  Please continue daily skin protection  including broad spectrum sunscreen SPF 30+ to sun-exposed areas, reapplying every 2 hours as needed when you're outdoors.   Staying in the shade or wearing long sleeves, sun glasses (UVA+UVB protection) and wide brim hats (4-inch brim around the entire circumference of the hat) are also recommended for sun protection.      Due to recent changes in healthcare laws, you may see results of your pathology and/or laboratory studies on MyChart before the doctors have had a chance to review them. We understand that in some cases there may be results that are confusing or concerning to you. Please understand that not all results are received at the same time and often the doctors may need to interpret multiple results in order to provide you with the best plan of care or course of treatment. Therefore, we ask that you please give us  2 business days to thoroughly review all your results before contacting the office for clarification. Should we see a critical lab result, you will be contacted sooner.   If You Need Anything After Your Visit  If you have any questions or concerns for your doctor, please call our main line at [phone] and press option 4 to reach your doctor's medical assistant. If no one answers, please leave a voicemail as directed and we will return your call as soon as possible. Messages left after 4 pm will be answered the following business day.  You may also send us  a message via MyChart. We typically respond to MyChart messages within 1-2 business days.  For prescription refills, please ask your pharmacy to contact our office. Our fax number is [phone].  If you have an urgent issue when the clinic is closed that cannot wait until the next business day, you can page your doctor at the number below.    Please note that while we do our best to be available for urgent issues outside of office hours, we are not available 24/7.   If you have an urgent issue and are unable to reach  us , you may choose to seek medical care at your doctor's office, retail clinic, urgent care center, or emergency room.  If you have a medical emergency, please immediately call 911 or go to the emergency department.  Pager Numbers  - Dr. Bary Likes: [phone]  - Dr. Annette Barters: [phone]  - Dr. Felipe Horton: [phone]   In the event of inclement weather, please call our main line at [phone] for an update on the status of any delays or closures.  Dermatology Medication Tips: Please keep the boxes that topical medications come in in order to help keep track of the instructions about where and how to use these. Pharmacies typically print the medication instructions only on the boxes and not directly on the medication tubes.   If your medication is too expensive, please contact our office at [phone] option 4 or send us  a message through MyChart.   We are unable to tell what your co-pay for medications will be in advance as this is different depending on your insurance coverage. However, we may be able to find a substitute medication at lower cost or fill out paperwork to get insurance to cover a needed medication.   If a prior authorization is required to get your medication covered by your insurance company, please allow us  1-2 business days to complete this process.  Drug prices often vary depending on where the prescription is filled and some pharmacies may offer cheaper prices.  The website www.goodrx.com contains coupons for medications through different pharmacies. The prices here do not account for what the cost may be with help from insurance (it may be cheaper with your insurance), but the website can give you the price if you did not use any insurance.  - You can print the associated coupon and take it with your prescription to the pharmacy.  - You may also stop by our office during regular business hours and pick up a GoodRx coupon card.  - If you need your prescription sent  electronically to a different pharmacy, notify our office through [facility] or by phone at [phone] option 4.     Si Usted Necesita Algo Despus de Su Visita  Tambin puede enviarnos un mensaje a travs de Clinical cytogeneticist. Por lo general respondemos a los mensajes de MyChart en el transcurso de 1 a 2 das hbiles.  Para renovar recetas, por favor pida a su farmacia que se ponga en contacto con nuestra oficina. Franz Jacks de fax es [place] [phone].  Si tiene un asunto urgente cuando la clnica est cerrada y que no puede esperar hasta el siguiente da hbil, puede llamar/localizar a su doctor(a) al nmero que aparece a continuacin.   Por favor, tenga en cuenta que aunque hacemos todo lo posible para estar disponibles para asuntos urgentes fuera del horario de Eugenio Saenz, no estamos disponibles las 24 horas del da, los 7 [address]  [address] de la [place].  Si tiene un problema urgente y no puede comunicarse con nosotros, puede optar por buscar atencin mdica  en el consultorio de su doctor(a), en una clnica privada, en un centro de atencin urgente o en una sala de emergencias.  Si tiene Engineer, drilling, por favor llame inmediatamente al 911 o vaya a la sala de emergencias.  Nmeros de bper  - Dr. Bary Likes: [phone]  - Dra. Annette Barters: [phone]  - Dr. Felipe Horton: [phone]   En caso de inclemencias del tiempo, por favor llame a Lajuan Pila principal al [phone] para una actualizacin sobre el [place] de cualquier retraso o cierre.  Consejos para la medicacin en dermatologa: Por favor, guarde las cajas en las que vienen los medicamentos de uso tpico para ayudarle a seguir las instrucciones sobre dnde y cmo usarlos. Las farmacias generalmente imprimen las instrucciones del medicamento slo en las cajas y no directamente en los tubos del [place].   Si su medicamento es muy caro, por favor, pngase en contacto con Bettyjane Brunet llamando al [phone] y presione la  opcin 4 o envenos un mensaje a travs de Clinical cytogeneticist.   No podemos decirle cul ser su copago por los medicamentos por adelantado ya que esto es diferente dependiendo de la cobertura de su seguro. Sin embargo, es posible que podamos encontrar un medicamento sustituto a Audiological scientist un formulario para que el seguro cubra el medicamento que se considera necesario.   Si se requiere una autorizacin previa para que su compaa de seguros [place] su medicamento, por favor permtanos de 1 a 2 das hbiles para completar este proceso.  Los precios de los medicamentos varan con frecuencia dependiendo del Environmental consultant de dnde se surte la receta y alguna farmacias pueden ofrecer precios ms baratos.  El sitio web www.goodrx.com tiene cupones para medicamentos de Health and safety inspector. Los precios aqu no tienen en cuenta lo que podra costar con la ayuda del seguro (puede ser ms barato con su seguro), pero el sitio web puede darle el precio si no utiliz Tourist information centre manager.  - Puede imprimir el cupn correspondiente y llevarlo con su receta a la farmacia.  - Tambin puede pasar por nuestra oficina durante el horario de atencin regular y Education officer, museum una tarjeta de cupones de GoodRx.  - Si necesita que su receta se enve electrnicamente a una farmacia diferente, informe a nuestra oficina a travs de MyChart de [place] o por telfono llamando al [phone] y presione la opcin 4.

## 2023-07-05 NOTE — Progress Notes (Signed)
 Follow-Up Visit   Subjective  Sonya Lopez is a 68 y.o. female who presents for the following: Skin Cancer Screening and Full Body Skin Exam. Hx of AKs. No personal hx of skin cancer or dysplastic nevi.  The patient presents for Total-Body Skin Exam (TBSE) for skin cancer screening and mole check. The patient has spots, moles and lesions to be evaluated, some may be new or changing and the patient may have concern these could be cancer.    The following portions of the chart were reviewed this encounter and updated as appropriate: medications, allergies, medical history  Review of Systems:  No other skin or systemic complaints except as noted in HPI or Assessment and Plan.  Objective  Well appearing patient in no apparent distress; mood and affect are within normal limits.  A full examination was performed including scalp, head, eyes, ears, nose, lips, neck, chest, axillae, abdomen, back, buttocks, bilateral upper extremities, bilateral lower extremities, hands, feet, fingers, toes, fingernails, and toenails. All findings within normal limits unless otherwise noted below.   Relevant physical exam findings are noted in the Assessment and Plan.    Assessment & Plan   SKIN CANCER SCREENING PERFORMED TODAY.  ACTINIC DAMAGE - Chronic condition, secondary to cumulative UV/sun exposure - diffuse scaly erythematous macules with underlying dyspigmentation - Recommend daily broad spectrum sunscreen SPF 30+ to sun-exposed areas, reapply every 2 hours as needed.  - Staying in the shade or wearing long sleeves, sun glasses (UVA+UVB protection) and wide brim hats (4-inch brim around the entire circumference of the hat) are also recommended for sun protection.  - Call for new or changing lesions.  LENTIGINES, SEBORRHEIC KERATOSES, HEMANGIOMAS - Benign normal skin lesions - Benign-appearing - Call for any changes  MELANOCYTIC NEVI - Tan-brown and/or pink-flesh-colored symmetric  macules and papules - 2 mm medium dark brown macule at right popliteal fossa - 1 mm med dark brown macule at posterior left thigh - 5 x 3 mm flesh papule at right oral commissure  - Benign appearing on exam today - Observation - Call clinic for new or changing moles - Recommend daily use of broad spectrum spf 30+ sunscreen to sun-exposed areas.    DERMATOFIBROMA Exam: Firm pink/brown papulenodule with dimple sign at right lateral ankle. Treatment Plan: A dermatofibroma is a benign growth possibly related to trauma, such as an insect bite, cut from shaving, or inflamed acne-type bump.  Treatment options to remove include shave or excision with resulting scar and risk of recurrence.  Since benign-appearing and not bothersome, will observe for now. ,   Rosacea/Acne face   Exam : telangectasia with mild erythema at malar cheeks and nose. Few inflammatory papules at right chin. Erythema and scale infra ocular.   Chronic and persistent condition with duration or expected duration over one year. Condition is improving with treatment but not currently at goal.     Rosacea is a chronic progressive skin condition usually affecting the face of adults, causing redness and/or acne bumps. It is treatable but not curable. It sometimes affects the eyes (ocular rosacea) as well. It may respond to topical and/or systemic medication and can flare with stress, sun exposure, alcohol, exercise and some foods.  Daily application of broad spectrum spf 30+ sunscreen to face is recommended to reduce flares.   Continue Elta MD clear tinted daily to face. D/C The Perfect A due to skin irritation Start Tretinoin 0.05% cream pea-sized amount to face at bedtime after moisturizer (samples given), wash  off in morning. Avoid periocular area. Continue Skin Medicinals Azelaic Acid: 15%, Niacinamide: 2% cr qam   Sebaceous Hyperplasia - Small yellow papules with a central dell at face - Benign-appearing - Observe. Call  for changes.   SEBORRHEIC KERATOSIS - Stuck-on, waxy, tan-brown papule at posterior crown scalp  - Benign-appearing - Discussed benign etiology and prognosis. - Observe - Call for any changes      Return in about 1 year (around 07/04/2024) for TBSE, HxAK.  I, Jill Parcell, CMA, am acting as scribe for Artemio Larry, MD.   Documentation: I have reviewed the above documentation for accuracy and completeness, and I agree with the above.  Artemio Larry, MD

## 2023-07-30 ENCOUNTER — Other Ambulatory Visit: Payer: Self-pay | Admitting: Neurology

## 2023-07-30 DIAGNOSIS — G43009 Migraine without aura, not intractable, without status migrainosus: Secondary | ICD-10-CM

## 2023-09-12 ENCOUNTER — Other Ambulatory Visit: Payer: Self-pay | Admitting: Family Medicine

## 2023-09-14 ENCOUNTER — Ambulatory Visit: Payer: Self-pay | Admitting: Family Medicine

## 2023-09-14 ENCOUNTER — Encounter: Payer: Self-pay | Admitting: Family Medicine

## 2023-09-14 VITALS — BP 118/74 | HR 73 | Resp 16 | Ht 66.0 in | Wt 229.7 lb

## 2023-09-14 DIAGNOSIS — K219 Gastro-esophageal reflux disease without esophagitis: Secondary | ICD-10-CM | POA: Diagnosis not present

## 2023-09-14 DIAGNOSIS — F341 Dysthymic disorder: Secondary | ICD-10-CM

## 2023-09-14 DIAGNOSIS — R739 Hyperglycemia, unspecified: Secondary | ICD-10-CM | POA: Diagnosis not present

## 2023-09-14 DIAGNOSIS — Z79899 Other long term (current) drug therapy: Secondary | ICD-10-CM | POA: Diagnosis not present

## 2023-09-14 DIAGNOSIS — G4733 Obstructive sleep apnea (adult) (pediatric): Secondary | ICD-10-CM

## 2023-09-14 DIAGNOSIS — E785 Hyperlipidemia, unspecified: Secondary | ICD-10-CM

## 2023-09-14 DIAGNOSIS — H6993 Unspecified Eustachian tube disorder, bilateral: Secondary | ICD-10-CM

## 2023-09-14 DIAGNOSIS — I480 Paroxysmal atrial fibrillation: Secondary | ICD-10-CM | POA: Diagnosis not present

## 2023-09-14 DIAGNOSIS — M797 Fibromyalgia: Secondary | ICD-10-CM

## 2023-09-14 DIAGNOSIS — D6869 Other thrombophilia: Secondary | ICD-10-CM

## 2023-09-14 DIAGNOSIS — E538 Deficiency of other specified B group vitamins: Secondary | ICD-10-CM

## 2023-09-14 DIAGNOSIS — E559 Vitamin D deficiency, unspecified: Secondary | ICD-10-CM

## 2023-09-14 DIAGNOSIS — R7982 Elevated C-reactive protein (CRP): Secondary | ICD-10-CM | POA: Diagnosis not present

## 2023-09-14 MED ORDER — METHOCARBAMOL 500 MG PO TABS: 500.0000 mg | ORAL_TABLET | Freq: Four times a day (QID) | ORAL | 1 refills | Status: AC | PRN

## 2023-09-14 MED ORDER — DULOXETINE HCL 20 MG PO CPEP
40.0000 mg | ORAL_CAPSULE | Freq: Every day | ORAL | 3 refills | Status: AC
Start: 2023-09-14 — End: ?

## 2023-09-14 MED ORDER — OMEPRAZOLE 20 MG PO CPDR: 20.0000 mg | DELAYED_RELEASE_CAPSULE | Freq: Every day | ORAL | 3 refills | Status: AC

## 2023-09-14 MED ORDER — AZELASTINE HCL 0.1 % NA SOLN: 2.0000 | Freq: Every evening | NASAL | 5 refills | Status: AC

## 2023-09-14 MED ORDER — METFORMIN HCL ER 500 MG PO TB24: 500.0000 mg | ORAL_TABLET | Freq: Every day | ORAL | 3 refills | Status: DC

## 2023-09-14 NOTE — Addendum Note (Signed)
 Addended by: GLENARD DORETTE FALCON on: 09/14/2023 09:52 AM   Modules accepted: Level of Service

## 2023-09-14 NOTE — Progress Notes (Signed)
 Name: Sonya Lopez   MRN: [id number]    DOB: [birth year]   Date:09/14/2023       Progress Note  Subjective  Chief Complaint  Chief Complaint  Patient presents with   Medical Management of Chronic Issues   Discussed the use of AI scribe software for clinical note transcription with the patient, who gave verbal consent to proceed.  History of Present Illness Sonya Lopez is a 68 year old female who presents for a routine follow-up visit.  She has a history of atrial flutter and atrial fibrillation, for which she underwent an ablation in January 2021. She continues to take Eliquis  and metoprolol . She rarely experiences an increased heart rate and uses an Apple Watch to monitor her heart rhythm, with an average heart rate in the seventies. No current atrial fibrillation symptoms are present.  She has obstructive sleep apnea and uses a CPAP machine nightly. She experiences issues with mask leakage, requiring adjustments or replacements. She follows up annually with a PA at [facility] for CPAP management.  She experienced a kidney stone episode on Jul 01, 2023, her third occurrence. She managed the pain with oxycodone , Flomax , and Zofran , passing the stone at home without needing procedures.  She has a history of fibromyalgia and takes duloxetine  twice daily, which helps both her fibromyalgia and emotional well-being. Her pain level is typically around five out of ten, with occasional exacerbations possibly related to weather changes. She also uses methocarbamol  as needed for muscle relaxation.  She has a history of major depressive disorder. She finds emotional support in family activities and occasional vacations. She takes duloxetine , which helps with both her mood and fibromyalgia.   She has migraines and currently uses Ubrelvy  and or Maxalt  for acute episodes. She also takes Emgality  for prevention .   She has a history of B12 deficiency but has stopped taking her  sublingual B12 supplement. She plans to restart it.  She has a history of gastroesophageal reflux disease (GERD) and takes omeprazole  daily. Her reflux is generally well-controlled unless she consumes large amounts of tomatoes.  She reports a sensation of ear fullness, which comes and goes, and uses Flonase for nasal congestion. She has tried Claritin in the past but experienced an increase in atrial fibrillation symptoms.  She has a history of breast cancer and underwent a lumpectomy. Her last mammogram in November 2024 was normal. Her daughter is currently undergoing evaluation for breast lumps.  She has a history of degenerative disc disease in the cervical spine and underwent a fusion in 2021. She reports no current issues with radicular symptoms.  She has a history of diabetes with well-controlled A1c levels. She has episodes of polyphagia  Morbid obesity, she has struggled with weight for years, she feels guilty for lack of self control. She has not been taking medications for weight loss, but tries to eat a balanced diet. She has a BMI over 35 with Dyslipidemia and OSA    Patient Active Problem List   Diagnosis Date Noted   Hypercoagulable state due to paroxysmal atrial fibrillation (HCC) 02/28/2023   Sleep-related headache 07/20/2022   Sleep-related hypoxia 07/20/2022   Sleep related bruxism 07/20/2022   Nocturnal hypoxemia 05/09/2022   Chronic migraine without aura, with intractable migraine, so stated, with status migrainosus 05/09/2022   Cervical spondylosis with radiculopathy 05/09/2022   Sleep apnea 05/09/2022   Hypotension 01/31/2021   History of diabetes mellitus, type II 09/11/2019   OSA on CPAP 03/27/2019  Syncope 05/10/2018   Pain, elbow joint 02/02/2018   PAF (paroxysmal atrial fibrillation) (HCC) 09/13/2017   Trochanteric bursitis of left hip 06/24/2017   Low back pain 02/22/2017   Benign breast cyst in female, right 05/25/2016   History of left breast cancer  09/16/2015   Long-term use of high-risk medication 02/25/2015   Mild major depression (HCC) 07/29/2014   Gastric reflux 07/29/2014   Chronic interstitial cystitis 07/29/2014   Calculus of kidney 07/29/2014   Allergic rhinitis 07/29/2014   Acne erythematosa 07/29/2014   Lumbosacral radiculitis 07/29/2014   Tinnitus 07/29/2014   Mixed incontinence, urge and stress (female) (female) 08/23/2013   Hyperlipidemia 04/25/2013   H/O: hysterectomy 11/30/2011   Atrial flutter (HCC) 05/03/2011   Bladder cystocele 01/05/2011   Migraine without aura and without status migrainosus, not intractable 12/29/2010   Fibromyalgia 12/29/2010   Obesity 12/29/2010    Past Surgical History:  Procedure Laterality Date   ABDOMINAL HYSTERECTOMY  2013   ANTERIOR AND POSTERIOR REPAIR  11/30/2011   Procedure: ANTERIOR (CYSTOCELE) AND POSTERIOR REPAIR (RECTOCELE);  Surgeon: Glendia DELENA Elizabeth, MD;  Location: [facility];  Service: Urology;  Laterality: N/A;  with cysto   ANTERIOR CERVICAL DECOMP/DISCECTOMY FUSION N/A 10/24/2019   Procedure: ANTERIOR CERVICAL DECOMPRESSION/DISCECTOMY FUSION 2 LEVELS C5-7;  Surgeon: Clois Fret, MD;  Location: [facility];  Service: Neurosurgery;  Laterality: N/A;   ATRIAL FIBRILLATION ABLATION N/A 03/01/2019   Procedure: ATRIAL FIBRILLATION ABLATION;  Surgeon: Inocencio Soyla Lunger, MD;  Location: [facility];  Service: Cardiovascular;  Laterality: N/A;   BLADDER SUSPENSION  11/30/2011   Procedure: SPARC PROCEDURE;  Surgeon: Glendia DELENA Elizabeth, MD;  Location: [facility];  Service: Urology;  Laterality: N/A;  graft 10x6   BREAST BIOPSY Right 2007   negative core   BREAST BIOPSY Left 09/16/2015   DCIS   BREAST LUMPECTOMY Left 09/30/2015   DCIS clear margins and rad tx/HIGH GRADE DUCTAL CARCINOMA IN SITU, COMEDO TYPE   BREAST LUMPECTOMY WITH NEEDLE LOCALIZATION Left 09/30/2015   DCIS excision, MammoSite radiation;  Surgeon: Reyes LELON Cota, MD;  Location: [facility];  Service:  General;  Laterality: Left;   CATARACT EXTRACTION Left 01/2019   CESAREAN SECTION     x 1   CHOLECYSTECTOMY  2005   COLONOSCOPY  2009   CYSTOSCOPY  11/30/2011   Procedure: CYSTOSCOPY;  Surgeon: Glendia DELENA Elizabeth, MD;  Location: [facility];  Service: Urology;;   DILATION AND CURETTAGE OF UTERUS  05/05/2007   GALLBLADDER SURGERY  2004   SPINE SURGERY  2022   TONSILLECTOMY  1961   VAGINAL HYSTERECTOMY  11/30/2011   Procedure: HYSTERECTOMY VAGINAL;  Surgeon: Harland JAYSON Birkenhead, MD;  Location: [facility];  Service: Gynecology;  Laterality: N/A;    Family History  Problem Relation Age of Onset   Diabetes Mother    Hearing loss Mother    Hypertension Mother    Stroke Mother    Kidney disease Mother    Sleep apnea Mother    Obesity Mother    Heart disease Father    Heart failure Father    Stroke Father    Depression Father    ADD / ADHD Father    Arthritis Father    Heart disease Paternal Grandfather    Asthma Daughter    ADD / ADHD Daughter    Asthma Daughter    Migraines Daughter    Fibromyalgia Daughter    Interstitial cystitis Daughter    Asthma Daughter    Colitis Daughter  Thyroid  cancer Daughter 25   Diabetes Brother    Hypertension Brother    Breast cancer Neg Hx     Social History   Tobacco Use   Smoking status: Never   Smokeless tobacco: Never   Tobacco comments:    Never smoked 02/28/23  Substance Use Topics   Alcohol use: Not Currently    Comment: Due to AF     Current Outpatient Medications:    acetaminophen  (TYLENOL ) 500 MG tablet, Take 2 tablets (1,000 mg total) by mouth every 8 (eight) hours. OTC, Disp: , Rfl: 0   apixaban  (ELIQUIS ) 5 MG TABS tablet, Take 1 tablet (5 mg total) by mouth 2 (two) times daily., Disp: 180 tablet, Rfl: 3   Cholecalciferol  (VITAMIN D3 GUMMIES) 25 MCG (1000 UT) CHEW, Chew 2 each by mouth daily at 12 noon., Disp: , Rfl:    Cyanocobalamin  (B-12-SL) 1000 MCG SUBL, Place 1 each under the tongue daily at 12 noon., Disp: , Rfl:     diltiazem  (CARDIZEM ) 30 MG tablet, Take 1 tablet (30 mg total) by mouth 4 (four) times daily as needed (atrial fibrillation or elevated heart rates)., Disp: 30 tablet, Rfl: 0   DULoxetine  (CYMBALTA ) 20 MG capsule, TAKE 2 CAPSULES BY MOUTH EVERY DAY, Disp: 180 capsule, Rfl: 1   ELDERBERRY PO, Take by mouth., Disp: , Rfl:    FINACEA 15 % FOAM, Apply 1 application topically daily as needed (rosacea). , Disp: , Rfl:    Galcanezumab -gnlm (EMGALITY ) 120 MG/ML SOAJ, INJECT INTO THE SKIN EVERY 30 DAYS AS DIRECTED, Disp: 1 mL, Rfl: 5   magnesium  oxide (MAG-OX) 400 MG tablet, Take 400 mg by mouth every morning., Disp: , Rfl:    methocarbamol  (ROBAXIN ) 500 MG tablet, Take 1 tablet (500 mg total) by mouth every 6 (six) hours as needed for muscle spasms., Disp: 90 tablet, Rfl: 0   metoprolol  succinate (TOPROL -XL) 25 MG 24 hr tablet, TAKE 1 TABLET BY MOUTH DAILY, Disp: 90 tablet, Rfl: 3   omeprazole  (PRILOSEC) 20 MG capsule, Take 1 capsule (20 mg total) by mouth daily., Disp: 90 capsule, Rfl: 3   Probiotic Product (PROBIOTIC PO), Take 1 capsule by mouth daily. , Disp: , Rfl:    rizatriptan  (MAXALT -MLT) 10 MG disintegrating tablet, Take 1 tablet (10 mg total) by mouth as needed for migraine. May repeat in 2 hours if needed, Disp: 27 tablet, Rfl: 4   tamsulosin  (FLOMAX ) 0.4 MG CAPS capsule, Take 1 capsule (0.4 mg total) by mouth daily., Disp: 14 capsule, Rfl: 0   tretinoin  (RETIN-A ) 0.05 % cream, Apply pea-sized amount to face at bedtime, wash off in the morning for acne/rosacea, Disp: 45 g, Rfl: 5   trolamine salicylate (ASPERCREME) 10 % cream, Apply 1 application topically as needed., Disp: , Rfl:    Ubrogepant  (UBRELVY ) 100 MG TABS, TAKE 1 TABLET BY MOUTH EVERY 2 HOURS AS NEEDED. MAX 2 TABS PER DAY, Disp: 15 tablet, Rfl: 8   ondansetron  (ZOFRAN -ODT) 4 MG disintegrating tablet, Take 1 tablet (4 mg total) by mouth every 8 (eight) hours as needed for nausea or vomiting. (Patient not taking: Reported on 09/14/2023),  Disp: 20 tablet, Rfl: 0   oxyCODONE  (ROXICODONE ) 5 MG immediate release tablet, Take 1 tablet (5 mg total) by mouth every 8 (eight) hours as needed. (Patient not taking: Reported on 09/14/2023), Disp: 20 tablet, Rfl: 0  Allergies  Allergen Reactions   Adhesive [Tape] Dermatitis    SKIN TURNS RED-PAPER TAPE OK TO USE   Claritin [Loratadine]  Swelling   Macrodantin [Nitrofurantoin] Nausea And Vomiting   Metronidazole Nausea Only   Nitrofurantoin Macrocrystal Nausea And Vomiting   Sulfamethoxazole -Trimethoprim  Nausea Only   Cephalexin Rash   Clindamycin Hcl Rash   Doxycycline Swelling and Rash   Erythromycin Rash    I personally reviewed active problem list, medication list, allergies, family history with the patient/caregiver today.   ROS  Ten systems reviewed and is negative except as mentioned in HPI    Objective Physical Exam  CONSTITUTIONAL: Patient appears well-developed and well-nourished.  No distress. HEENT: Head atraumatic, normocephalic, neck supple. CARDIOVASCULAR: Normal rate, regular rhythm and normal heart sounds.  No murmur heard. No BLE edema. PULMONARY: Effort normal and breath sounds normal. No respiratory distress. MUSCULOSKELETAL: Normal gait. Without gross motor or sensory deficit. PSYCHIATRIC: Patient has a normal mood and affect. behavior is normal. Judgment and thought content normal.  Vitals:   09/14/23 0812  BP: 118/74  Pulse: 73  Resp: 16  SpO2: 95%  Weight: 229 lb 11.2 oz (104.2 kg)  Height: 5' 6 (1.676 m)    Body mass index is 37.07 kg/m.  Recent Results (from the past 2160 hours)  Basic metabolic panel     Status: Abnormal   Collection Time: 07/01/23  8:38 AM  Result Value Ref Range   Sodium 137 135 - 145 mmol/L   Potassium 4.1 3.5 - 5.1 mmol/L   Chloride 104 98 - 111 mmol/L   CO2 23 22 - 32 mmol/L   Glucose, Bld 157 (H) 70 - 99 mg/dL    Comment: Glucose reference range applies only to samples taken after fasting for at least 8  hours.   BUN 23 8 - 23 mg/dL   Creatinine, Ser 9.12 0.44 - 1.00 mg/dL   Calcium 9.0 8.9 - 89.6 mg/dL   GFR, Estimated >39 >39 mL/min    Comment: (NOTE) Calculated using the CKD-EPI Creatinine Equation (2021)    Anion gap 10 5 - 15    Comment: Performed at [facility], [address] [place], [place] [address]  CBC     Status: None   Collection Time: 07/01/23  8:38 AM  Result Value Ref Range   WBC 10.5 4.0 - 10.5 K/uL   RBC 4.23 3.87 - 5.11 MIL/uL   Hemoglobin 12.9 12.0 - 15.0 g/dL   HCT 61.5 63.9 - 53.9 %   MCV 90.8 80.0 - 100.0 fL   MCH 30.5 26.0 - 34.0 pg   MCHC 33.6 30.0 - 36.0 g/dL   RDW 85.3 88.4 - 84.4 %   Platelets 314 150 - 400 K/uL   nRBC 0.0 0.0 - 0.2 %    Comment: Performed at [facility], [address] [place], [place] [address]  Urinalysis, Routine w reflex microscopic -Urine, Clean Catch     Status: Abnormal   Collection Time: 07/01/23  9:29 AM  Result Value Ref Range   Color, Urine YELLOW (A) YELLOW   APPearance HAZY (A) CLEAR   Specific Gravity, Urine 1.023 1.005 - 1.030   pH 5.0 5.0 - 8.0   Glucose, UA NEGATIVE NEGATIVE mg/dL   Hgb urine dipstick NEGATIVE NEGATIVE   Bilirubin Urine NEGATIVE NEGATIVE   Ketones, ur NEGATIVE NEGATIVE mg/dL   Protein, ur NEGATIVE NEGATIVE mg/dL   Nitrite NEGATIVE NEGATIVE   Leukocytes,Ua NEGATIVE NEGATIVE    Comment: Performed at [facility], [address] [place], [place] [address]      PHQ2/9:    09/14/2023  8:11 AM 09/30/2022    8:25 AM 09/13/2022   10:04 AM 09/10/2021    9:12 AM 06/23/2021   11:30 AM  Depression screen PHQ 2/9  Decreased Interest 0 0 0 0 0  Down, Depressed, Hopeless 0 0 0 0 0  PHQ - 2 Score 0 0 0 0 0  Altered sleeping 0  0 0   Tired, decreased energy 0  0 0   Change in appetite 0  0 0   Feeling bad or failure about yourself  0  0 0   Trouble concentrating 0  0 0   Moving slowly or fidgety/restless 0  0 0   Suicidal thoughts 0  0 0   PHQ-9 Score 0  0 0    Difficult doing work/chores Not difficult at all        phq 9 is negative  Fall Risk:    09/14/2023    8:11 AM 09/30/2022    8:19 AM 09/13/2022   10:04 AM 09/10/2021    9:12 AM 06/23/2021   11:32 AM  Fall Risk   Falls in the past year? 0 0 0 0 0  Number falls in past yr: 0 0 0 0 0  Injury with Fall? 0 0 0 0 0  Risk for fall due to : No Fall Risks No Fall Risks No Fall Risks No Fall Risks No Fall Risks  Follow up Falls evaluation completed Education provided;Falls prevention discussed Falls prevention discussed Falls prevention discussed  Falls prevention discussed      Data saved with a previous flowsheet row definition     Assessment & Plan Obstructive sleep apnea managed with CPAP Issues with mask seal causing leaks. New CPAP machine obtained. - Continue CPAP therapy. - Follow up with PA at [facility] for CPAP management.  Atrial fibrillation and atrial flutter/hypercoagulable state due to afib, status post ablation, on anticoagulation and beta blocker Status post ablation in January 2021. On Eliquis  and metoprolol . Occasional increased heart rate sensation, no AFib detected on Apple Watch. CHAD2 VASc score of 3, indicating continued anticoagulation. - Continue Eliquis  and metoprolol . - Monitor heart rate with Apple Watch.  Migraine, stable on acute therapy Well-controlled with Ubrelvy  for acute episodes. No longer using Maxalt . - Continue Ubrelvy  for acute migraine episodes.  Fibromyalgia with chronic pain Chronic pain level 5/10, managed with duloxetine  and methocarbamol . Pain exacerbated by weather changes, improves with rest. - Continue duloxetine  and methocarbamol . - Provide 4-month supply of duloxetine . - Provide 90 tablets of methocarbamol  500 mg.  Dysthymia, stable on duloxetine  Well-managed on duloxetine . Emotional health supported by family activities. Duloxetine  aids fibromyalgia management. - Continue duloxetine .  Morbid obesity with BMI =35 and  comorbidities Comorbid with sleep apnea, diabetes, and dyslipidemia. Discussed dietary modifications and metformin  for weight management and insulin  resistance. - Start metformin  500 mg daily. - Encourage dietary changes to reduce ultra-processed foods. - Increase intake of fruits, vegetables, and whole foods.  History of type 2 diabetes , has hyperglycemia  Well-controlled. Discussed metformin  for weight management and insulin  resistance. - Start metformin  500 mg daily.  Gastroesophageal reflux disease (GERD) Managed with omeprazole . Symptoms exacerbated by tomatoes. Discussed tapering omeprazole . - Continue omeprazole . - Taper omeprazole  gradually after summer. - Use antacids as needed during taper.  Vitamin B12 deficiency Previously managed with sublingual B12 supplements. She has stopped taking supplements. - Restart sublingual B12 supplements. - Check B12 levels.  Cervical degenerative disc disease, status post fusion Status post fusion in 2021. No current  issues with radicular symptoms.  Nephrolithiasis (kidney stones), recurrent Recent episode in May. Passed stone at home with medication. Advised to collect stones for analysis in future episodes. - Encourage increased fluid intake. - Save Flomax  for future episodes. - Consider stone analysis if possible.  Dyslipidemia Slightly elevated lipid levels. - Order lipid panel.  Eustachian tube dysfunction Causing sensation of clogged ears. Managed with nasal sprays. - Continue Flonase. - Start Astelin  nasal spray at night.  History of breast cancer, status post lumpectomy Last mammogram in November of last year was normal. - Schedule mammogram.

## 2023-09-14 NOTE — Addendum Note (Signed)
 Addended by: RENTERIA-GARCIA, Boby Eyer on: 09/14/2023 01:19 PM   Modules accepted: Orders

## 2023-09-15 ENCOUNTER — Ambulatory Visit: Payer: Self-pay | Admitting: Family Medicine

## 2023-09-15 LAB — LIPID PANEL
Cholesterol: 184 mg/dL (ref <200)
HDL: 53 mg/dL (ref >=50)
LDL Cholesterol (Calc): 114 mg/dL — ABNORMAL HIGH
Non-HDL Cholesterol (Calc): 131 mg/dL — ABNORMAL HIGH (ref <130)
Total CHOL/HDL Ratio: 3.5 (calc) (ref <5.0)
Triglycerides: 78 mg/dL (ref <150)

## 2023-09-15 LAB — COMPREHENSIVE METABOLIC PANEL WITH GFR
AG Ratio: 1.8 (calc) (ref 1.0–2.5)
ALT: 15 U/L (ref 6–29)
AST: 18 U/L (ref 10–35)
Albumin: 4.2 g/dL (ref 3.6–5.1)
Alkaline phosphatase (APISO): 133 U/L (ref 37–153)
BUN: 19 mg/dL (ref 7–25)
CO2: 30 mmol/L (ref 20–32)
Calcium: 9.6 mg/dL (ref 8.6–10.4)
Chloride: 103 mmol/L (ref 98–110)
Creat: 0.92 mg/dL (ref 0.60–1.00)
Globulin: 2.4 g/dL (ref 1.9–3.7)
Glucose, Bld: 92 mg/dL (ref 65–99)
Potassium: 4.8 mmol/L (ref 3.5–5.3)
Sodium: 141 mmol/L (ref 135–146)
Total Bilirubin: 0.4 mg/dL (ref 0.2–1.2)
Total Protein: 6.6 g/dL (ref 6.1–8.1)
eGFR: 67 mL/min/1.73m2 (ref >=60)

## 2023-09-15 LAB — CBC WITH DIFFERENTIAL/PLATELET
Absolute Lymphocytes: 1448 {cells}/uL (ref 850–3900)
Absolute Monocytes: 575 {cells}/uL (ref 200–950)
Basophils Absolute: 50 {cells}/uL (ref 0–200)
Basophils Relative: 0.7 %
Eosinophils Absolute: 78 {cells}/uL (ref 15–500)
Eosinophils Relative: 1.1 %
HCT: 39.2 % (ref 35.0–45.0)
Hemoglobin: 12.7 g/dL (ref 11.7–15.5)
MCH: 30.3 pg (ref 27.0–33.0)
MCHC: 32.4 g/dL (ref 32.0–36.0)
MCV: 93.6 fL (ref 80.0–100.0)
MPV: 9.9 fL (ref 7.5–12.5)
Monocytes Relative: 8.1 %
Neutro Abs: 4949 {cells}/uL (ref 1500–7800)
Neutrophils Relative %: 69.7 %
Platelets: 357 Thousand/uL (ref 140–400)
RBC: 4.19 Million/uL (ref 3.80–5.10)
RDW: 13.5 % (ref 11.0–15.0)
Total Lymphocyte: 20.4 %
WBC: 7.1 Thousand/uL (ref 3.8–10.8)

## 2023-09-15 LAB — HEMOGLOBIN A1C
Hgb A1c MFr Bld: 5.9 % — ABNORMAL HIGH (ref <5.7)
Mean Plasma Glucose: 123 mg/dL
eAG (mmol/L): 6.8 mmol/L

## 2023-09-15 LAB — B12 AND FOLATE PANEL
Folate: 11.5 ng/mL
Vitamin B-12: 358 pg/mL (ref 200–1100)

## 2023-09-15 LAB — VITAMIN D 25 HYDROXY (VIT D DEFICIENCY, FRACTURES): Vit D, 25-Hydroxy: 76 ng/mL (ref 30–100)

## 2023-09-15 LAB — C-REACTIVE PROTEIN: CRP: 12.4 mg/L — ABNORMAL HIGH (ref <8.0)

## 2023-09-19 ENCOUNTER — Encounter: Payer: Self-pay | Admitting: Family Medicine

## 2023-10-06 ENCOUNTER — Other Ambulatory Visit: Payer: Self-pay | Admitting: Family Medicine

## 2023-10-06 DIAGNOSIS — N951 Menopausal and female climacteric states: Secondary | ICD-10-CM

## 2023-10-10 ENCOUNTER — Ambulatory Visit
Admission: RE | Admit: 2023-10-10 | Discharge: 2023-10-10 | Disposition: A | Discharge status: Home / Self Care | Source: Ambulatory Visit | Attending: Family Medicine | Admitting: Family Medicine

## 2023-10-10 DIAGNOSIS — N951 Menopausal and female climacteric states: Secondary | ICD-10-CM | POA: Diagnosis not present

## 2023-10-10 DIAGNOSIS — Z780 Asymptomatic menopausal state: Secondary | ICD-10-CM | POA: Insufficient documentation

## 2023-11-04 ENCOUNTER — Other Ambulatory Visit: Payer: Self-pay | Admitting: General Surgery

## 2023-11-04 DIAGNOSIS — Z1231 Encounter for screening mammogram for malignant neoplasm of breast: Secondary | ICD-10-CM

## 2023-12-20 ENCOUNTER — Encounter: Payer: Self-pay | Admitting: Adult Health

## 2023-12-20 ENCOUNTER — Ambulatory Visit (INDEPENDENT_AMBULATORY_CARE_PROVIDER_SITE_OTHER): Payer: Medicare Other | Admitting: Adult Health

## 2023-12-20 VITALS — BP 145/67 | HR 68 | Ht 66.0 in | Wt 229.0 lb

## 2023-12-20 DIAGNOSIS — G43711 Chronic migraine without aura, intractable, with status migrainosus: Secondary | ICD-10-CM

## 2023-12-20 DIAGNOSIS — G4733 Obstructive sleep apnea (adult) (pediatric): Secondary | ICD-10-CM | POA: Diagnosis not present

## 2023-12-20 NOTE — Progress Notes (Signed)
 [facility] [address] [place]. [place] [address] [phone]       OFFICE FOLLOW UP NOTE  [name] Date of Birth:  06-29-1955 Medical Record Number:  [id number]    Reason for visit: CPAP follow-up    SUBJECTIVE:   CHIEF COMPLAINT:  Chief Complaint  Patient presents with   Follow-up    Pt in 8 alone Pt here for cpap f/u  Pt states still here air leaking from mask  Pt states Migraines are better Pt states 4 migraines monthly     Follow-up visit:  Prior visit: 07/20/2022 Dr. Chalice  Brief HPI:   Sonya Lopez is a 68 y.o. female who was initially evaluated by Dr. Chalice in 2020 and completed sleep study in 07/2018 with total AHI of 5.4/h but did show bradycardia and hypoxia in all apnea strongly dependent and supine sleep position.  She was referred to a pulmonologist who repeated a sleep study and was instructed to sleep on her back, HST qualified for CPAP therapy with mild OSA of AHI 11/h and initiated CPAP therapy through pulmonology but had difficulty tolerating interface despite trying several different masks,did not have f/u with pulmonology. She was referred to dentistry Dr. Melba for oral appliance.  She is also followed by Dr. Ines for migraine management.  At prior visit, compliance report showed excellent compliance and optimal residual AHI with combination of CPAP and oral appliance.  Difficulty tolerating FFM, requested f/u with DME for change of interface.  She continue to follow with Dr. Melba for management of oral appliance.    Interval history:  Returns today for follow-up visit.  Compliance report shows excellent usage and optimal residual AHI.  She continues to have leaks but feels this has improved compared to prior visit.  She did try Evora full face mask but caused pressure over maxillary sinuses.  She has returned back to standard fullface mask and feels overall tolerating well.  She continues to use oral  appliance and routinely follows with Dr. Melba.  ESS 11/24.  Migraines remain well-controlled.  Typically has about 3 to 4/month.  Continued monthly use of Emgality .  Using Ubrelvy  as needed but typically requires second dose and only occasionally aborts migraine.  She will have different insurance coverage next year and is concerned they may not cover Ubrelvy .           ROS:   14 system review of systems performed and negative with exception of those listed in HPI  PMH:  Past Medical History:  Diagnosis Date   Actinic keratosis    Allergy    Anxiety    situational anxiety   Arrhythmia    A-fib/A-flutter   Atrial flutter (HCC)    Breast neoplasm, Tis (DCIS), left 10/2015   ER 50%, PR 11-50%.Unable to tolerate Tamoxifen  or Aromasin.  MammoSite   Calculus of kidney    Cancer (HCC) 2017   Left- DCIS   Depression    Diet-controlled type 2 diabetes mellitus (HCC)    Difficult intubation 2009   [facility]- anes had to use  'boogee DURING GALLBLADDER SURGERY ONLY-NO PROBLEMS SINCE   Female cystocele 01/05/2011   Fibromyalgia    Gallbladder problem    GERD (gastroesophageal reflux disease)    Headache(784.0)    MIGRAINES   History of kidney stones    IBS (irritable bowel syndrome)    Joint pain    Nocturnal hypoxemia 05/09/2022   Obesity    OSA (obstructive  sleep apnea) 03/27/2019   USE CPAP   OSA (obstructive sleep apnea)    Personal history of radiation therapy 2017   LEFT lumpectomy   PONV (postoperative nausea and vomiting)    h/o difficult intubation and anes had to use boogee   Sleep apnea 2020   Vitamin D  deficiency    Vitreous detachment of right eye    sees floaters    PSH:  Past Surgical History:  Procedure Laterality Date   ABDOMINAL HYSTERECTOMY  2013   ANTERIOR AND POSTERIOR REPAIR  11/30/2011   Procedure: ANTERIOR (CYSTOCELE) AND POSTERIOR REPAIR (RECTOCELE);  Surgeon: Glendia DELENA Elizabeth, MD;  Location: [facility];  Service: Urology;   Laterality: N/A;  with cysto   ANTERIOR CERVICAL DECOMP/DISCECTOMY FUSION N/A 10/24/2019   Procedure: ANTERIOR CERVICAL DECOMPRESSION/DISCECTOMY FUSION 2 LEVELS C5-7;  Surgeon: Clois Fret, MD;  Location: [facility];  Service: Neurosurgery;  Laterality: N/A;   ATRIAL FIBRILLATION ABLATION N/A 03/01/2019   Procedure: ATRIAL FIBRILLATION ABLATION;  Surgeon: Inocencio Soyla Lunger, MD;  Location: [facility];  Service: Cardiovascular;  Laterality: N/A;   BLADDER SUSPENSION  11/30/2011   Procedure: SPARC PROCEDURE;  Surgeon: Glendia DELENA Elizabeth, MD;  Location: [facility];  Service: Urology;  Laterality: N/A;  graft 10x6   BREAST BIOPSY Right 2007   negative core   BREAST BIOPSY Left 09/16/2015   DCIS   BREAST LUMPECTOMY Left 09/30/2015   DCIS clear margins and rad tx/HIGH GRADE DUCTAL CARCINOMA IN SITU, COMEDO TYPE   BREAST LUMPECTOMY WITH NEEDLE LOCALIZATION Left 09/30/2015   DCIS excision, MammoSite radiation;  Surgeon: Reyes LELON Cota, MD;  Location: [facility];  Service: General;  Laterality: Left;   CATARACT EXTRACTION Left 01/2019   CESAREAN SECTION     x 1   CHOLECYSTECTOMY  2005   COLONOSCOPY  2009   CYSTOSCOPY  11/30/2011   Procedure: CYSTOSCOPY;  Surgeon: Glendia DELENA Elizabeth, MD;  Location: [facility];  Service: Urology;;   DILATION AND CURETTAGE OF UTERUS  05/05/2007   GALLBLADDER SURGERY  2004   SPINE SURGERY  2022   TONSILLECTOMY  1961   VAGINAL HYSTERECTOMY  11/30/2011   Procedure: HYSTERECTOMY VAGINAL;  Surgeon: Harland JAYSON Birkenhead, MD;  Location: [facility];  Service: Gynecology;  Laterality: N/A;    Social History:  Social History   Socioeconomic History   Marital status: Married    Spouse name: Theatre Manager    Number of children: 3   Years of education: 16   Highest education level: Bachelor's degree (e.g., BA, AB, BS)  Occupational History   Occupation: Diplomatic Services Operational Officer  Tobacco Use   Smoking status: Never   Smokeless tobacco: Never   Tobacco comments:    Never smoked 02/28/23   Vaping Use   Vaping status: Never Used  Substance and Sexual Activity   Alcohol use: Not Currently    Comment: Due to AF   Drug use: No   Sexual activity: Yes    Partners: Male    Birth control/protection: Surgical  Other Topics Concern   Not on file  Social History Narrative   Lives at home with husband    Right handed   Caffeine: about 1 cup daily   Social Drivers of Health   Financial Resource Strain: Low Risk  (09/12/2023)   Overall Financial Resource Strain (CARDIA)    Difficulty of Paying Living Expenses: Not very hard  Food Insecurity: No Food Insecurity (09/12/2023)   Hunger Vital Sign    Worried About Running Out of  Food in the Last Year: Never true    Ran Out of Food in the Last Year: Never true  Transportation Needs: No Transportation Needs (09/12/2023)   PRAPARE - Administrator, Civil Service (Medical): No    Lack of Transportation (Non-Medical): No  Physical Activity: Insufficiently Active (09/12/2023)   Exercise Vital Sign    Days of Exercise per Week: 4 days    Minutes of Exercise per Session: 20 min  Stress: No Stress Concern Present (09/12/2023)   [employer] of Occupational Health - Occupational Stress Questionnaire    Feeling of Stress: Only a little  Social Connections: Moderately Isolated (09/12/2023)   Social Connection and Isolation Panel    Frequency of Communication with Friends and Family: More than three times a week    Frequency of Social Gatherings with Friends and Family: More than three times a week    Attends Religious Services: Never    Database Administrator or Organizations: No    Attends Engineer, Structural: Not on file    Marital Status: Married  Catering Manager Violence: Not At Risk (09/30/2022)   Humiliation, Afraid, Rape, and Kick questionnaire    Fear of Current or Ex-Partner: No    Emotionally Abused: No    Physically Abused: No    Sexually Abused: No    Family History:  Family History  Problem  Relation Age of Onset   Diabetes Mother    Hearing loss Mother    Hypertension Mother    Stroke Mother    Kidney disease Mother    Sleep apnea Mother    Obesity Mother    Heart disease Father    Heart failure Father    Stroke Father    Depression Father    ADD / ADHD Father    Arthritis Father    Heart disease Paternal Grandfather    Asthma Daughter    ADD / ADHD Daughter    Asthma Daughter    Migraines Daughter    Fibromyalgia Daughter    Interstitial cystitis Daughter    Asthma Daughter    Colitis Daughter    Thyroid  cancer Daughter 46   Diabetes Brother    Hypertension Brother    Breast cancer Neg Hx     Medications:   Current Outpatient Medications on File Prior to Visit  Medication Sig Dispense Refill   acetaminophen  (TYLENOL ) 500 MG tablet Take 2 tablets (1,000 mg total) by mouth every 8 (eight) hours. OTC  0   apixaban  (ELIQUIS ) 5 MG TABS tablet Take 1 tablet (5 mg total) by mouth 2 (two) times daily. 180 tablet 3   azelastine  (ASTELIN ) 0.1 % nasal spray Place 2 sprays into both nostrils every evening. Use in each nostril as directed 30 mL 5   Cholecalciferol  (VITAMIN D3 GUMMIES) 25 MCG (1000 UT) CHEW Chew 2 each by mouth daily at 12 noon.     Cyanocobalamin  (B-12-SL) 1000 MCG SUBL Place 1 each under the tongue daily at 12 noon.     diltiazem  (CARDIZEM ) 30 MG tablet Take 1 tablet (30 mg total) by mouth 4 (four) times daily as needed (atrial fibrillation or elevated heart rates). 30 tablet 0   DULoxetine  (CYMBALTA ) 20 MG capsule Take 2 capsules (40 mg total) by mouth daily. 180 capsule 3   ELDERBERRY PO Take by mouth.     FINACEA 15 % FOAM Apply 1 application topically daily as needed (rosacea).      Galcanezumab -gnlm (EMGALITY ) 120 MG/ML  SOAJ INJECT INTO THE SKIN EVERY 30 DAYS AS DIRECTED 1 mL 5   magnesium  oxide (MAG-OX) 400 MG tablet Take 400 mg by mouth every morning.     methocarbamol  (ROBAXIN ) 500 MG tablet Take 1 tablet (500 mg total) by mouth every 6 (six)  hours as needed for muscle spasms. 90 tablet 1   metoprolol  succinate (TOPROL -XL) 25 MG 24 hr tablet TAKE 1 TABLET BY MOUTH DAILY 90 tablet 3   omeprazole  (PRILOSEC) 20 MG capsule Take 1 capsule (20 mg total) by mouth daily. 90 capsule 3   Probiotic Product (PROBIOTIC PO) Take 1 capsule by mouth daily.      tretinoin  (RETIN-A ) 0.05 % cream Apply pea-sized amount to face at bedtime, wash off in the morning for acne/rosacea 45 g 5   trolamine salicylate (ASPERCREME) 10 % cream Apply 1 application topically as needed.     Ubrogepant  (UBRELVY ) 100 MG TABS TAKE 1 TABLET BY MOUTH EVERY 2 HOURS AS NEEDED. MAX 2 TABS PER DAY 15 tablet 8   metFORMIN  (GLUCOPHAGE -XR) 500 MG 24 hr tablet Take 1 tablet (500 mg total) by mouth daily with breakfast. 90 tablet 3   rizatriptan  (MAXALT -MLT) 10 MG disintegrating tablet Take 1 tablet (10 mg total) by mouth as needed for migraine. May repeat in 2 hours if needed 27 tablet 4   No current facility-administered medications on file prior to visit.    Allergies:   Allergies  Allergen Reactions   Adhesive [Tape] Dermatitis    SKIN TURNS RED-PAPER TAPE OK TO USE   Claritin [Loratadine] Swelling   Macrodantin [Nitrofurantoin] Nausea And Vomiting   Metronidazole Nausea Only   Nitrofurantoin Macrocrystal Nausea And Vomiting   Sulfamethoxazole -Trimethoprim  Nausea Only   Cephalexin Rash   Clindamycin Hcl Rash   Doxycycline Swelling and Rash   Erythromycin Rash      OBJECTIVE:  Physical Exam  Vitals:   12/20/23 1322  BP: (!) 145/67  Pulse: 68  Weight: 229 lb (103.9 kg)  Height: 5' 6 (1.676 m)   Body mass index is 36.96 kg/m. No results found.  General: well developed, well nourished, very pleasant elderly Caucasian female, seated, in no evident distress  Neurologic Exam Mental Status: Awake and fully alert. Oriented to place and time. Recent and remote memory intact. Attention span, concentration and fund of knowledge appropriate. Mood and affect  appropriate.  Cranial Nerves: Pupils equal, briskly reactive to light. Extraocular movements full without nystagmus. Visual fields full to confrontation. Hearing intact. Facial sensation intact. Face, tongue, palate moves normally and symmetrically.  Motor: Normal bulk and tone. Normal strength in all tested extremity muscles Gait and Station: Arises from chair without difficulty. Stance is normal. Gait demonstrates normal stride length and balance without use of AD. Tandem walk and heel toe without difficulty.  Reflexes: 1+ and symmetric. Toes downgoing.         ASSESSMENT/PLAN: Sonya Lopez is a 68 y.o. year old female    OSA :  Current use of both CPAP and oral appliance with adequate control of sleep apnea.   Continue current pressure setting of 13 Continue to follow with DME company [employer] for any needed supplies or CPAP related concerns.   Continue to follow with Dr. Melba for oral appliance management. CPAP set up 08/2018; due for new CPAP - will place order for repeat HST to obtain new CPAP machine. She is aware not to use CPAP or oral appliance for repeat HST  Chronic migraines: Prevention: Continue Emgality   monthly injection Rescue: try Nurtec as needed, samples provided, will call if beneficial.  Previously followed by Dr. Ines - scheduled 05/2024 with Dr. Chalice to establish care for migraine management    Follow up in 31-90 days after receiving new CPAP machine   CC:  PCP: Hughie Sharper, MD      Harlene Bogaert, AGNP-BC  [facility] [address] [place], [place] [address]  Phone [phone] Fax [phone] Note: This document was prepared with digital dictation and possible smart phrase technology. Any transcriptional errors that result from this process are unintentional.

## 2023-12-20 NOTE — Patient Instructions (Addendum)
 Your Plan:  Continue nightly use of CPAP - you will be called to repeat a home sleep study and if apnea still present, you will get set up with a new CPAP machine  Continue Emgality  monthly injection   Try Nurtec as needed for migraine rescue - if helpful, please let me know and I will place an order         Thank you for coming to see us  at [facility]. I hope we have been able to provide you high quality care today.  You may receive a patient satisfaction survey over the next few weeks. We would appreciate your feedback and comments so that we may continue to improve ourselves and the health of our patients.

## 2023-12-22 ENCOUNTER — Other Ambulatory Visit: Payer: Self-pay | Admitting: Medical Genetics

## 2023-12-23 ENCOUNTER — Other Ambulatory Visit
Admission: RE | Admit: 2023-12-23 | Discharge: 2023-12-23 | Disposition: A | Discharge status: Home / Self Care | Payer: Self-pay | Source: Ambulatory Visit | Attending: Medical Genetics | Admitting: Medical Genetics

## 2023-12-27 ENCOUNTER — Encounter: Payer: Self-pay | Admitting: Adult Health

## 2023-12-28 ENCOUNTER — Ambulatory Visit
Admission: RE | Admit: 2023-12-28 | Discharge: 2023-12-28 | Disposition: A | Discharge status: Home / Self Care | Source: Ambulatory Visit | Attending: General Surgery | Admitting: General Surgery

## 2023-12-28 ENCOUNTER — Telehealth: Payer: Self-pay | Admitting: Pharmacist

## 2023-12-28 DIAGNOSIS — Z1231 Encounter for screening mammogram for malignant neoplasm of breast: Secondary | ICD-10-CM | POA: Insufficient documentation

## 2023-12-28 MED ORDER — NURTEC 75 MG PO TBDP: ORAL_TABLET | ORAL | 5 refills | Status: AC

## 2023-12-28 NOTE — Telephone Encounter (Signed)
 Pharmacy Patient Advocate Encounter   Received notification from Patient Pharmacy that prior authorization for Nurtec 75MG  dispersible tablets is required/requested.   Insurance verification completed.   The patient is insured through [facility].   Per test claim: PA required; PA started via CoverMyMeds. KEY [id number] . Waiting for clinical questions to populate.

## 2024-01-03 ENCOUNTER — Other Ambulatory Visit: Payer: Self-pay | Admitting: General Surgery

## 2024-01-03 DIAGNOSIS — R928 Other abnormal and inconclusive findings on diagnostic imaging of breast: Secondary | ICD-10-CM

## 2024-01-03 NOTE — Telephone Encounter (Signed)
 Pharmacy Patient Advocate Encounter  Received notification from [facility] that Prior Authorization for Nurtec 75MG  dispersible tablets has been APPROVED from 01/03/2024 to 01/02/2025   PA #/Case ID/Reference #: [id number]

## 2024-01-05 DIAGNOSIS — R928 Other abnormal and inconclusive findings on diagnostic imaging of breast: Secondary | ICD-10-CM | POA: Diagnosis not present

## 2024-01-07 LAB — GENECONNECT MOLECULAR SCREEN: Genetic Analysis Overall Interpretation: NEGATIVE

## 2024-01-10 ENCOUNTER — Inpatient Hospital Stay: Admission: RE | Admit: 2024-01-10 | Discharge: 2024-01-10 | Attending: General Surgery

## 2024-01-10 DIAGNOSIS — R928 Other abnormal and inconclusive findings on diagnostic imaging of breast: Secondary | ICD-10-CM | POA: Diagnosis present

## 2024-01-11 ENCOUNTER — Ambulatory Visit: Admitting: Neurology

## 2024-01-11 DIAGNOSIS — G4733 Obstructive sleep apnea (adult) (pediatric): Secondary | ICD-10-CM

## 2024-01-13 NOTE — Progress Notes (Signed)
 "          [facility] SLEEP TEST REPORT ( by Watch PAT)   STUDY DATE:  01-11-24 01-13-24, PM    Sonya Lopez 68 year old female 05-03-1955      ORDERING CLINICIAN:  Whitfield Raisin , NP for Dr Onetha Epp, MD  REFERRING CLINICIANBETHA Dopp, MD    CLINICAL INFORMATION/HISTORY: 07-30-2023, Dr Epp: Migraine without aura, OSA on CPAP, Atrial fibrillation. Ureterolithiasis, Depression, HTN.   12-20-2023, Raisin Whitfield, NP.  She has returned back to standard fullface mask and feels overall tolerating well. She continues to use oral appliance and routinely follows with Dr. Melba. ESS 11/24. Previously followed by Dr. Epp - scheduled 05/2024 with Dr. Chalice to establish care for migraine management sleep study in 07/2018 with total AHI of 5.4/h but did show bradycardia and hypoxia in all apnea strongly dependent and supine sleep position. She was referred to a pulmonologist who repeated a sleep study and was instructed to sleep on her back, HST qualified for CPAP therapy with mild OSA of AHI 11/h and initiated CPAP therapy through pulmonology but had difficulty tolerating interface despite trying several different masks,did not have f/u with pulmonology. She was referred to dentistry Dr. Melba for oral appliance. She is also followed by Dr. Epp for migraine management. Residual AHI of 1.4/h on 13 cm water  CPAP pressure without EPR, highly compliant. Set up 08/2018.     Epworth sleepiness score: 11/24. FSS: not reported   BMI: 37 kg/m Neck Circumference: NA   Sleep Summary:   Total Recording Time (hours, min):    8 h 1 min    Total Sleep Time (hours, min):    6 h 5 min          Percent REM (%):  12.6 %                                       Respiratory Indices:   Calculated pAHI per CMS guideline: 5.0/h  ( AASM guidelines indicate 11.6/h)                          REM pAHI:  22.3/h       ( AHI by AASM  39.4 /h)                                         NREM  pAHI:  2.5 /h      ( AHI by  AASM 7.5/h )                       Positional AHI:    349 minutes of supine sleep, associated AHI was  5.1/h per CMS guidelines.   Snoring:    412 dB , low volume                                             Oxygen Saturation Statistics:   Oxygen Saturation (%) Mean:     92%, with an O2 Saturation Range (%) from 87 through 97%:  O2 Saturation (minutes) <89%:   < 1 minute        Pulse Rate Statistics:   Pulse Mean (bpm): 61 BPM             Pulse Range: 45 - 88 bpm                 IMPRESSION:  This HST confirms the presence of very mild Obstructive sleep apnea, with a very high REM sleep dependence.    RECOMMENDATION: I recommend to continue using CPAP as main therapy and to reduce body weight.  REM sleep dependent sleep  apnea is most responsive to weight loss. If the patient is willing to continue CPAP therapy, a new ResMed autotitration capable CPAP device will be ordered and set again at 13 cm water  , without EPR and with heated humidification. The DME will provide a mask of patient's choice.    Any patient should be cautioned not to drive, work at heights, or operate dangerous or heavy equipment when tired, confused or sleepy.   Review of good sleep hygiene measures is accessible to any sleep clinic patient and can be reiterated through online material- I we recommend the Guide to better Sleep  published by the NIH.   Weight loss and Core Strength improvement is highly recommended for individuals with low muscle tone and/ or a BMI over 30.  Any CPAP patient should be reminded to be fully compliant with PAP therapy , (defined as using PAP therapy for more than 4 hours each night ) with the goal to improve sleep related symptoms and decrease long term cardiovascular risks.   The referring physician will be notified of the test results.       INTERPRETING PHYSICIAN: Dedra Gores, MD  [facility]  [place] [facility] [employer] [facility] Board certified by [employer] of Sleep Medicine and Diplomate of the [employer] of Sleep Medicine. Board certified In Neurology through the ABPN, Fellow of the [employer] of Neurology.                            "

## 2024-01-28 NOTE — Procedures (Signed)
 "      [facility] SLEEP TEST REPORT ( by Watch PAT)   STUDY DATE:  01-11-24 01-13-24, PM    Sonya Lopez [place] 68 year old female February 14, 1955      ORDERING CLINICIAN:  Whitfield Raisin , NP for Dr Onetha Epp, MD  REFERRING CLINICIANBETHA Dopp, MD    CLINICAL INFORMATION/HISTORY: 07-30-2023, Dr Epp: Migraine without aura, OSA on CPAP, Atrial fibrillation. Ureterolithiasis, Depression, HTN.   12-20-2023, Raisin Whitfield, NP.  She has returned back to standard fullface mask and feels overall tolerating well. She continues to use oral appliance and routinely follows with Dr. Melba. ESS 11/24. Previously followed by Dr. Epp - scheduled 05/2024 with Dr. Chalice to establish care for migraine management sleep study in 07/2018 with total AHI of 5.4/h but did show bradycardia and hypoxia in all apnea strongly dependent and supine sleep position. She was referred to a pulmonologist who repeated a sleep study and was instructed to sleep on her back, HST qualified for CPAP therapy with mild OSA of AHI 11/h and initiated CPAP therapy through pulmonology but had difficulty tolerating interface despite trying several different masks,did not have f/u with pulmonology. She was referred to dentistry Dr. Melba for oral appliance. She is also followed by Dr. Epp for migraine management. Residual AHI of 1.4/h on 13 cm water  CPAP pressure without EPR, highly compliant. Set up 08/2018.     Epworth sleepiness score: 11/24. FSS: not reported   BMI: 37 kg/m Neck Circumference: NA   Sleep Summary:   Total Recording Time (hours, min):    8 h 1 min    Total Sleep Time (hours, min):    6 h 5 min          Percent REM (%):  12.6 %                                       Respiratory Indices:   Calculated pAHI per CMS guideline: 5.0/h  ( AASM guidelines indicate 11.6/h)                          REM pAHI:  22.3/h       ( AHI by AASM  39.4 /h)                                         NREM pAHI:   2.5 /h      ( AHI by  AASM 7.5/h )                       Positional AHI:    349 minutes of supine sleep, associated AHI was  5.1/h per CMS guidelines.   Snoring:    412 dB , low volume                                             Oxygen Saturation Statistics:   Oxygen Saturation (%) Mean:     92%, with an O2 Saturation Range (%) from 87 through 97%:  O2 Saturation (minutes) <89%:   < 1 minute        Pulse Rate Statistics:   Pulse Mean (bpm): 61 BPM             Pulse Range: 45 - 88 bpm                 IMPRESSION:  This HST confirms the presence of very mild Obstructive sleep apnea, with a very high REM sleep dependence.    RECOMMENDATION: I recommend to continue using CPAP as main therapy and to reduce body weight.  REM sleep dependent sleep  apnea is most responsive to weight loss. If the patient is willing to continue CPAP therapy, a new ResMed autotitration capable CPAP device will be ordered and set again at 13 cm water  , without EPR and with heated humidification. The DME will provide a mask of patient's choice.    Any patient should be cautioned not to drive, work at heights, or operate dangerous or heavy equipment when tired, confused or sleepy.   Review of good sleep hygiene measures is accessible to any sleep clinic patient and can be reiterated through online material- I we recommend the Guide to better Sleep  published by the NIH.   Weight loss and Core Strength improvement is highly recommended for individuals with low muscle tone and/ or a BMI over 30.  Any CPAP patient should be reminded to be fully compliant with PAP therapy , (defined as using PAP therapy for more than 4 hours each night ) with the goal to improve sleep related symptoms and decrease long term cardiovascular risks.   The referring physician will be notified of the test results.       INTERPRETING PHYSICIAN: Dedra Gores, MD  [facility]   [place] [facility] [employer] [facility] Board certified by [employer] of Sleep Medicine and Diplomate of the [employer] of Sleep Medicine. Board certified In Neurology through the ABPN, Fellow of the [employer] of Neurology.                           "

## 2024-02-01 ENCOUNTER — Ambulatory Visit: Payer: Self-pay | Admitting: Adult Health

## 2024-02-01 DIAGNOSIS — G4733 Obstructive sleep apnea (adult) (pediatric): Secondary | ICD-10-CM

## 2024-02-07 NOTE — Telephone Encounter (Signed)
 Spoke to patient aware of new pap machine being ordered . Made f/u visit with Harlene ,NP 04/2024 . Sent over orders to [employer] this am

## 2024-02-15 NOTE — Progress Notes (Signed)
 "  Cardiology Office Note    Date:  02/16/2024   ID:  Sonya Lopez, DOB 1955-09-04, MRN [id number]  PCP:  Hughie Sharper, MD  Cardiologist:  None  Electrophysiologist:  None   Chief Complaint: Follow up  History of Present Illness:   Sonya Lopez is a 69 y.o. female with history of paroxysmal atrial fibrillation/flutter s/p A-fib ablation 02/2019, OSA, diabetes, history of syncope, and hyperlipidemia who presents for follow up on afib.    Patient was admitted in 2013 for new onset atrial flutter.  She was managed with cardioversion and subsequently started on amiodarone .  Echo showed normal LV systolic function with no significant valvular abnormalities.  She has since followed with Dr. Gollan for her atrial fibrillation.  Calcium  score 09/2015 was 0.  She was referred to EP in 2020 after she continued to have episodes of atrial fibrillation.  She underwent A-fib ablation 02/2019 with amiodarone  discontinued thereafter.  She has had some breakthrough episodes of atrial fibrillation since ablation and takes as needed diltiazem  for breakthrough episodes. She is anticoagulated with Eliquis .   Patient was most recently seen in the cardiology clinic 02/14/2023.  She was doing well and maintaining sinus rhythm.  No further testing or medication changes were indicated at that time.  Patient presents today overall doing well from a cardiac perspective.  She does not believe that she has had any episodes of atrial fibrillation in the past year and has not needed to take any as needed diltiazem  since her last visit.  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  She denies chest pain, shortness of breath, lightheadedness, dizziness, palpitations, and lower extremity swelling.  She denies bleeding and hematochezia.  Labs independently reviewed: 09/2023-Hgb 12.7, HCT 39.2, platelets 357, BUN 19, creatinine 0.92,  sodium 141, potassium 4.8, normal LFTs, TC 184, TG 78, HDL 53, LDL 114  Objective   Past Medical History:  Diagnosis Date   Actinic keratosis    Allergy    Anxiety    situational anxiety   Arrhythmia    A-fib/A-flutter   Atrial flutter (HCC)    Breast neoplasm, Tis (DCIS), left 10/2015   ER 50%, PR 11-50%.Unable to tolerate Tamoxifen  or Aromasin.  MammoSite   Calculus of kidney    Cancer (HCC) 2017   Left- DCIS   Depression    Diet-controlled type 2 diabetes mellitus (HCC)    Difficult intubation 2009   [facility]- anes had to use  'boogee DURING GALLBLADDER SURGERY ONLY-NO PROBLEMS SINCE   Female cystocele 01/05/2011   Fibromyalgia    Gallbladder problem    GERD (gastroesophageal reflux disease)    Headache(784.0)    MIGRAINES   History of kidney stones    IBS (irritable bowel syndrome)    Joint pain    Nocturnal hypoxemia 05/09/2022   Obesity    OSA (obstructive sleep apnea) 03/27/2019   USE CPAP   OSA (obstructive sleep apnea)    Personal history of radiation therapy 2017   LEFT lumpectomy   PONV (postoperative nausea and vomiting)    h/o difficult intubation and anes had to use boogee   Sleep apnea 2020   Vitamin D  deficiency    Vitreous detachment of right eye    sees floaters    Current Medications: Active Medications[1]  Allergies:   Adhesive [tape], Claritin [loratadine], Macrodantin [nitrofurantoin], Metronidazole, Nitrofurantoin macrocrystal, Sulfamethoxazole -trimethoprim , Cephalexin, Clindamycin hcl, Doxycycline, and Erythromycin  Social History   Socioeconomic History   Marital status: Married    Spouse name: Theatre Manager    Number of children: 3   Years of education: 16   Highest education level: Bachelor's degree (e.g., BA, AB, BS)  Occupational History   Occupation: Diplomatic Services Operational Officer  Tobacco Use   Smoking status: Never   Smokeless tobacco: Never   Tobacco comments:    Never smoked 02/28/23  Vaping Use   Vaping status: Never Used   Substance and Sexual Activity   Alcohol use: Not Currently    Comment: Due to AF   Drug use: No   Sexual activity: Yes    Partners: Male    Birth control/protection: Surgical  Other Topics Concern   Not on file  Social History Narrative   Lives at home with husband    Right handed   Caffeine: about 1 cup daily   Social Drivers of Health   Tobacco Use: Low Risk (02/16/2024)   Patient History    Smoking Tobacco Use: Never    Smokeless Tobacco Use: Never    Passive Exposure: Not on file  Financial Resource Strain: Low Risk (09/12/2023)   Overall Financial Resource Strain (CARDIA)    Difficulty of Paying Living Expenses: Not very hard  Food Insecurity: No Food Insecurity (09/12/2023)   Epic    Worried About Programme Researcher, Broadcasting/film/video in the Last Year: Never true    Ran Out of Food in the Last Year: Never true  Transportation Needs: No Transportation Needs (09/12/2023)   Epic    Lack of Transportation (Medical): No    Lack of Transportation (Non-Medical): No  Physical Activity: Insufficiently Active (09/12/2023)   Exercise Vital Sign    Days of Exercise per Week: 4 days    Minutes of Exercise per Session: 20 min  Stress: No Stress Concern Present (09/12/2023)   [employer] of Occupational Health - Occupational Stress Questionnaire    Feeling of Stress: Only a little  Social Connections: Moderately Isolated (09/12/2023)   Social Connection and Isolation Panel    Frequency of Communication with Friends and Family: More than three times a week    Frequency of Social Gatherings with Friends and Family: More than three times a week    Attends Religious Services: Never    Database Administrator or Organizations: No    Attends Engineer, Structural: Not on file    Marital Status: Married  Depression (PHQ2-9): Low Risk (09/14/2023)   Depression (PHQ2-9)    PHQ-2 Score: 0  Alcohol Screen: Low Risk (09/12/2023)   Alcohol Screen    Last Alcohol Screening Score (AUDIT): 1   Housing: Unknown (01/04/2024)   Received from [facility]   Epic    Unable to Pay for Housing in the Last Year: Not on file    Number of Times Moved in the Last Year: Not on file    At any time in the past 12 months, were you homeless or living in a shelter (including now)?: No  Utilities: Not At Risk (09/30/2022)   [facility] Utilities    Threatened with loss of utilities: No  Health Literacy: Adequate Health Literacy (09/30/2022)   B1300 Health Literacy    Frequency of need for help with medical instructions: Never     Family History:  [name] family history includes ADD / ADHD in her daughter and father; Arthritis in her father; Asthma in her daughter, daughter, and daughter; Colitis in her daughter; Depression in  her father; Diabetes in her brother and mother; Fibromyalgia in her daughter; Hearing loss in her mother; Heart disease in her father and paternal grandfather; Heart failure in her father; Hypertension in her brother and mother; Interstitial cystitis in her daughter; Kidney disease in her mother; Migraines in her daughter; Obesity in her mother; Sleep apnea in her mother; Stroke in her father and mother; Thyroid  cancer (age of onset: 1) in her daughter. There is no history of Breast cancer.  ROS:   12-point review of systems is negative unless otherwise noted in the HPI.  EKGs/Other Studies Reviewed:    Studies reviewed were summarized above. The additional studies were reviewed today:  04/2022 Calcium  score No visible coronary artery calcifications. Total coronary calcium  score of 0.  EKG:  EKG personally reviewed by me today EKG Interpretation Date/Time:  Thursday February 16 2024 10:52:55 EST Ventricular Rate:  52 PR Interval:  132 QRS Duration:  102 QT Interval:  444 QTC Calculation: 412 R Axis:   30  Text Interpretation: Sinus bradycardia Confirmed by Lorene Sinclair ([id number]) on 02/16/2024 10:57:36 AM  PHYSICAL EXAM:    VS:  BP 136/66 (BP Location:  Left Arm, Patient Position: Sitting, Cuff Size: Large)   Pulse (!) 52   Ht 5' 6 (1.676 m)   Wt 229 lb 12.8 oz (104.2 kg)   SpO2 93%   BMI 37.09 kg/m   BMI: Body mass index is 37.09 kg/m.  GEN: Well nourished, well developed in no acute distress NECK: No JVD; No carotid bruits CARDIAC: RRR, no murmurs, rubs, gallops RESPIRATORY:  Clear to auscultation without rales, wheezing or rhonchi  ABDOMEN: Soft, non-tender, non-distended EXTREMITIES: No edema; No deformity  Wt Readings from Last 3 Encounters:  02/16/24 229 lb 12.8 oz (104.2 kg)  12/20/23 229 lb (103.9 kg)  09/14/23 229 lb 11.2 oz (104.2 kg)                  ASSESSMENT & PLAN:   Paroxysmal atrial fibrillation/flutter - Maintaining sinus rhythm.  Denies any symptoms concerning for recurrence of atrial fibrillation/flutter over the past year.  She is continued on metoprolol  succinate 25 mg daily and Eliquis  5 mg twice daily.  Denies bleeding and hematochezia.  Hyperlipidemia - Most recent lipid panel 09/2023 with LDL 114.  Discussed ASCVD risk which was calculated at 9.1%.  Based on this, recommended starting atorvastatin  20 mg daily which she was agreeable to.  We will update CMP in 1 month.  She will have repeat lipid panel with PCP.  History of syncope - In the setting of dehydration, hypotension, and COVID in 2022.  No recurrence since.  OSA - Uses CPAP.   Disposition: Start atorvastatin  20 mg daily.  Update CMP in 1 month.  F/u with Dr. Gollan or an APP in 1 year.   Medication Adjustments/Labs and Tests Ordered: Current medicines are reviewed at length with the patient today.  Concerns regarding medicines are outlined above. Medication changes, Labs and Tests ordered today are summarized above and listed in the Patient Instructions accessible in Encounters.   Bonney Sinclair Lorene, PA-C 02/16/2024 12:37 PM     [facility] [address] [place], [place] [address] [phone]      [1]  Current Meds  Medication Sig   acetaminophen  (TYLENOL ) 500 MG tablet Take 2 tablets (1,000 mg total) by mouth every 8 (eight) hours. OTC   apixaban  (ELIQUIS ) 5 MG TABS tablet Take 1 tablet (5  mg total) by mouth 2 (two) times daily.   atorvastatin  (LIPITOR) 20 MG tablet Take 1 tablet (20 mg total) by mouth daily.   azelastine  (ASTELIN ) 0.1 % nasal spray Place 2 sprays into both nostrils every evening. Use in each nostril as directed   Cholecalciferol  (VITAMIN D3 GUMMIES) 25 MCG (1000 UT) CHEW Chew 2 each by mouth daily at 12 noon.   Cyanocobalamin  (B-12-SL) 1000 MCG SUBL Place 1 each under the tongue daily at 12 noon.   diltiazem  (CARDIZEM ) 30 MG tablet Take 1 tablet (30 mg total) by mouth 4 (four) times daily as needed (atrial fibrillation or elevated heart rates).   DULoxetine  (CYMBALTA ) 20 MG capsule Take 2 capsules (40 mg total) by mouth daily.   ELDERBERRY PO Take by mouth.   FINACEA 15 % FOAM Apply 1 application topically daily as needed (rosacea).    Galcanezumab -gnlm (EMGALITY ) 120 MG/ML SOAJ INJECT INTO THE SKIN EVERY 30 DAYS AS DIRECTED   magnesium  oxide (MAG-OX) 400 MG tablet Take 400 mg by mouth every morning.   methocarbamol  (ROBAXIN ) 500 MG tablet Take 1 tablet (500 mg total) by mouth every 6 (six) hours as needed for muscle spasms.   metoprolol  succinate (TOPROL -XL) 25 MG 24 hr tablet TAKE 1 TABLET BY MOUTH DAILY   omeprazole  (PRILOSEC) 20 MG capsule Take 1 capsule (20 mg total) by mouth daily.   Probiotic Product (PROBIOTIC PO) Take 1 capsule by mouth daily.    Rimegepant Sulfate (NURTEC) 75 MG TBDP Take 1 tablet by mouth as needed for migraine. Take as close to onset of migraine as possible. Max of 1 tablet in 24 hours.   tretinoin  (RETIN-A ) 0.05 % cream Apply pea-sized amount to face at bedtime, wash off in the morning for acne/rosacea   trolamine salicylate (ASPERCREME) 10 % cream Apply 1 application topically as needed.   "

## 2024-02-16 ENCOUNTER — Ambulatory Visit: Attending: Cardiology | Admitting: Physician Assistant

## 2024-02-16 ENCOUNTER — Encounter: Payer: Self-pay | Admitting: Physician Assistant

## 2024-02-16 VITALS — BP 136/66 | HR 52 | Ht 66.0 in | Wt 229.8 lb

## 2024-02-16 DIAGNOSIS — Z87898 Personal history of other specified conditions: Secondary | ICD-10-CM | POA: Diagnosis not present

## 2024-02-16 DIAGNOSIS — E785 Hyperlipidemia, unspecified: Secondary | ICD-10-CM | POA: Diagnosis not present

## 2024-02-16 DIAGNOSIS — I480 Paroxysmal atrial fibrillation: Secondary | ICD-10-CM | POA: Diagnosis not present

## 2024-02-16 DIAGNOSIS — I4892 Unspecified atrial flutter: Secondary | ICD-10-CM | POA: Insufficient documentation

## 2024-02-16 DIAGNOSIS — G4733 Obstructive sleep apnea (adult) (pediatric): Secondary | ICD-10-CM | POA: Diagnosis not present

## 2024-02-16 MED ORDER — ATORVASTATIN CALCIUM 20 MG PO TABS: 20.0000 mg | ORAL_TABLET | Freq: Every day | ORAL | 3 refills | Status: DC

## 2024-02-16 NOTE — Progress Notes (Signed)
 Sonya Lopez

## 2024-02-16 NOTE — Telephone Encounter (Signed)
 [facility](ph [phone] fax [phone]) has called for CPAP supplies Rx to be sent over

## 2024-02-16 NOTE — Patient Instructions (Addendum)
 Medication Instructions:  Your physician recommends the following medication changes.  START TAKING: ATORVASTATIN  20 MG DAILY   Lab Work: Your provider would like for you to return in 2-4 WEEKS  to have the following labs drawn: CMP.   Please go to [facility] [address] ([facility] [address], [place] [address] You do not need an appointment.  They are open from 8 am- 4:30 pm.  Lunch from 1:00 pm- 2:00 pm You DO NOT need to be fasting.   You may also go to one of the following [facility]:  [name] [place], [place] [address] Phone: [phone] Lab hours: Mon-Fri 8 am- 5 pm    Lunch 12 pm- 1 pm  [address] [place],  [place]  [address]  [place]  Phone: [phone] Lab hours: 7 am- 4 pm Lunch 12 pm-1 pm   [address] [place],  [place]  [address]  [place]  Phone: [phone] Lab hours: Mon-Fri 8 am- 5 pm    Lunch 12 pm- 1 pm  If you have labs (blood work) drawn today and your tests are completely normal, you will receive your results only by: MyChart Message (if you have MyChart) OR A paper copy in the mail If you have any lab test that is abnormal or we need to change your treatment, we will call you to review the results.  Testing/Procedures: No test ordered today   Follow-Up: At [facility], you and your health needs are our priority.  As part of our continuing mission to provide you with exceptional heart care, our providers are all part of one team.  This team includes your primary Cardiologist (physician) and Advanced Practice Providers or APPs (Physician Assistants and Nurse Practitioners) who all work together to provide you with the care you need, when you need it.  Your next appointment:   1 year(s)  Provider:   You may see one of the following Advanced Practice Providers on your designated Care Team:   Lonni Meager, NP Lesley Maffucci, PA-C Bernardino Bring, PA-C Cadence Altona, PA-C Tylene Lunch, NP Barnie Hila, NP     We recommend signing up for the patient portal called MyChart.  Sign up information is provided on this After Visit Summary.  MyChart is used to connect with patients for Virtual Visits (Telemedicine).  Patients are able to view lab/test results, encounter notes, upcoming appointments, etc.  Non-urgent messages can be sent to your provider as well.   To learn more about what you can do with MyChart, go to [email or web address].   Other Instructions

## 2024-02-16 NOTE — Addendum Note (Signed)
 Addended by: JENEL FRIEZE D on: 02/16/2024 04:34 PM   Modules accepted: Orders

## 2024-02-21 NOTE — Telephone Encounter (Signed)
 SABRA

## 2024-02-21 NOTE — Telephone Encounter (Signed)
 Avelina from [facility] stated  they have  faxed over a new order for Pt CPAP supplies, Due to Careers adviser .they need more information on Pt  request  as far as supplies   Call back number is  [phone]

## 2024-02-27 ENCOUNTER — Other Ambulatory Visit: Payer: Self-pay | Admitting: Cardiovascular Disease

## 2024-02-27 DIAGNOSIS — I484 Atypical atrial flutter: Secondary | ICD-10-CM

## 2024-02-28 ENCOUNTER — Ambulatory Visit (HOSPITAL_COMMUNITY): Admitting: Physician Assistant

## 2024-02-28 NOTE — Telephone Encounter (Signed)
 Eliquis  5mg  refill request received. Patient is 69 years old, weight-104.2kg, Crea-0.92 on 09/14/23, Diagnosis-Afib, and last seen by Lesley Maffucci on 02/16/24. Dose is appropriate based on dosing criteria. Will send in refill to requested pharmacy.

## 2024-03-07 ENCOUNTER — Ambulatory Visit (HOSPITAL_COMMUNITY)
Admission: RE | Admit: 2024-03-07 | Discharge: 2024-03-07 | Disposition: A | Source: Ambulatory Visit | Attending: Physician Assistant | Admitting: Physician Assistant

## 2024-03-07 VITALS — BP 142/70 | HR 70 | Ht 66.0 in | Wt 228.0 lb

## 2024-03-07 DIAGNOSIS — I480 Paroxysmal atrial fibrillation: Secondary | ICD-10-CM

## 2024-03-07 DIAGNOSIS — I4891 Unspecified atrial fibrillation: Secondary | ICD-10-CM

## 2024-03-07 DIAGNOSIS — D6869 Other thrombophilia: Secondary | ICD-10-CM

## 2024-03-07 NOTE — Progress Notes (Signed)
 "   Primary Care Physician: Hughie Sharper, MD Primary Cardiologist: Evalene Lunger, MD Electrophysiologist: None  Referring Physician: Dr Inocencio Romero DELENA Sonya Lopez is a 69 y.o. female with a history of OSA, DM, atrial flutter, atrial fibrillation who presents for follow up in the [facility]. She is status post atrial fibrillation ablation 03/01/2019. She was previously on amiodarone . Patient is on Eliquis  for stroke prevention.   Patient returns for follow up for atrial fibrillation. Discussed the use of AI scribe software for clinical note transcription with the patient, who gave verbal consent to proceed.  She has not experienced any atrial fibrillation episodes since her last visit and has not received any alerts from her Apple Watch indicating AFib. She is in SR today and feels well. She has been on blood thinners without any bleeding issues.      Today, she  denies symptoms of palpitations, chest pain, shortness of breath, orthopnea, PND, lower extremity edema, dizziness, presyncope, syncope, bleeding, or neurologic sequela. The patient is tolerating medications without difficulties and is otherwise without complaint today.    Atrial Fibrillation Risk Factors:  she does have symptoms or diagnosis of sleep apnea. she does not have a history of rheumatic fever.   Atrial Fibrillation Management history:  Previous antiarrhythmic drugs: amiodarone   Previous cardioversions: none Previous ablations: 03/01/19 Anticoagulation history: Eliquis   ROS- All systems are reviewed and negative except as per the HPI above.  Past Medical History:  Diagnosis Date   Actinic keratosis    Allergy    Anxiety    situational anxiety   Arrhythmia    A-fib/A-flutter   Atrial flutter (HCC)    Breast neoplasm, Tis (DCIS), left 10/2015   ER 50%, PR 11-50%.Unable to tolerate Tamoxifen  or Aromasin.  MammoSite   Calculus of kidney    Cancer (HCC) 2017   Left- DCIS    Depression    Diet-controlled type 2 diabetes mellitus (HCC)    Difficult intubation 2009   [facility]- anes had to use  'boogee DURING GALLBLADDER SURGERY ONLY-NO PROBLEMS SINCE   Female cystocele 01/05/2011   Fibromyalgia    Gallbladder problem    GERD (gastroesophageal reflux disease)    Headache(784.0)    MIGRAINES   History of kidney stones    IBS (irritable bowel syndrome)    Joint pain    Nocturnal hypoxemia 05/09/2022   Obesity    OSA (obstructive sleep apnea) 03/27/2019   USE CPAP   OSA (obstructive sleep apnea)    Personal history of radiation therapy 2017   LEFT lumpectomy   PONV (postoperative nausea and vomiting)    h/o difficult intubation and anes had to use boogee   Sleep apnea 2020   Vitamin D  deficiency    Vitreous detachment of right eye    sees floaters    Current Outpatient Medications  Medication Sig Dispense Refill   acetaminophen  (TYLENOL ) 500 MG tablet Take 2 tablets (1,000 mg total) by mouth every 8 (eight) hours. OTC (Patient taking differently: Take 1,000 mg by mouth as needed. OTC)  0   apixaban  (ELIQUIS ) 5 MG TABS tablet TAKE 1 TABLET (5 MG TOTAL) BY MOUTH 2 TIMES DAILY. 180 tablet 2   azelastine  (ASTELIN ) 0.1 % nasal spray Place 2 sprays into both nostrils every evening. Use in each nostril as directed 30 mL 5   Cholecalciferol  (VITAMIN D3 GUMMIES) 25 MCG (1000 UT) CHEW Chew 2 each by mouth daily at 12 noon.  Cyanocobalamin  (B-12-SL) 1000 MCG SUBL Place 1 each under the tongue daily at 12 noon.     diltiazem  (CARDIZEM ) 30 MG tablet Take 1 tablet (30 mg total) by mouth 4 (four) times daily as needed (atrial fibrillation or elevated heart rates). 30 tablet 0   DULoxetine  (CYMBALTA ) 20 MG capsule Take 2 capsules (40 mg total) by mouth daily. 180 capsule 3   ELDERBERRY PO Take by mouth. (Patient taking differently: Taking 2 gummies by daily)     FINACEA 15 % FOAM Apply 1 application topically daily as needed (rosacea).       Galcanezumab -gnlm (EMGALITY ) 120 MG/ML SOAJ INJECT INTO THE SKIN EVERY 30 DAYS AS DIRECTED 1 mL 5   magnesium  oxide (MAG-OX) 400 MG tablet Take 400 mg by mouth every morning.     methocarbamol  (ROBAXIN ) 500 MG tablet Take 1 tablet (500 mg total) by mouth every 6 (six) hours as needed for muscle spasms. (Patient taking differently: Take 500 mg by mouth as needed for muscle spasms.) 90 tablet 1   metoprolol  succinate (TOPROL -XL) 25 MG 24 hr tablet TAKE 1 TABLET BY MOUTH DAILY 90 tablet 3   omeprazole  (PRILOSEC) 20 MG capsule Take 1 capsule (20 mg total) by mouth daily. 90 capsule 3   Probiotic Product (PROBIOTIC PO) Take 1 capsule by mouth daily.      Rimegepant Sulfate (NURTEC) 75 MG TBDP Take 1 tablet by mouth as needed for migraine. Take as close to onset of migraine as possible. Max of 1 tablet in 24 hours. 16 tablet 5   tretinoin  (RETIN-A ) 0.05 % cream Apply pea-sized amount to face at bedtime, wash off in the morning for acne/rosacea 45 g 5   trolamine salicylate (ASPERCREME) 10 % cream Apply 1 application topically as needed.     No current facility-administered medications for this encounter.    Physical Exam: BP (!) 142/70   Pulse 70   Ht 5' 6 (1.676 m)   Wt 103.4 kg   BMI 36.80 kg/m   GEN: Well nourished, well developed in no acute distress CARDIAC: Regular rate and rhythm, no murmurs, rubs, gallops RESPIRATORY:  Clear to auscultation without rales, wheezing or rhonchi  ABDOMEN: Soft, non-tender, non-distended EXTREMITIES:  No edema; No deformity    Wt Readings from Last 3 Encounters:  03/07/24 103.4 kg  02/16/24 104.2 kg  12/20/23 103.9 kg     EKG Interpretation Date/Time:  Wednesday March 07 2024 14:48:11 EST Ventricular Rate:  70 PR Interval:  138 QRS Duration:  80 QT Interval:  382 QTC Calculation: 412 R Axis:   61  Text Interpretation: Normal sinus rhythm Normal ECG When compared with ECG of 16-Feb-2024 10:52, No significant change was found Confirmed by  Arley Garant ([id number]) on 03/07/2024 2:57:27 PM     CHA2DS2-VASc Score = 3  [name] score is based upon: CHF History: 0 HTN History: 0 Diabetes History: 1 Stroke History: 0 Vascular Disease History: 0 Age Score: 1 Gender Score: 1       ASSESSMENT AND PLAN: Paroxysmal Atrial Fibrillation/atrial flutter (ICD10:  I48.0) [name] CHA2DS2-VASc score is 3, indicating a 3.2% annual risk of stroke.   S/p afib ablation 03/01/19 Patient appears to be maintaining SR Continue Eliquis  5 mg BID Continue Toprol  25 mg daily Continue diltiazem  30 mg PRN q 4 hours for heart racing.   Secondary Hypercoagulable State (ICD10:  D68.69) The patient is at significant risk for stroke/thromboembolism based upon her CHA2DS2-VASc Score of 3.  Continue Apixaban  (Eliquis ).  No bleeding issues.   OSA  Encouraged nightly CPAP   Follow up in the AF clinic as needed.    Daril Kicks PA-C Af[facility] [address] [place], [place] [address] [phone] "

## 2024-04-04 ENCOUNTER — Encounter: Admitting: Adult Health

## 2024-05-07 ENCOUNTER — Ambulatory Visit: Admitting: Neurology

## 2024-07-17 ENCOUNTER — Encounter: Admitting: Dermatology

## 2024-09-14 ENCOUNTER — Ambulatory Visit: Admitting: Family Medicine
